# Patient Record
Sex: Male | Born: 1980
Health system: Southern US, Community
[De-identification: ages and names within clinical notes are randomized; demographics above are authoritative.]

## PROBLEM LIST (undated history)

## (undated) DIAGNOSIS — N179 Acute kidney failure, unspecified: Secondary | ICD-10-CM

## (undated) DIAGNOSIS — B20 Human immunodeficiency virus [HIV] disease: Secondary | ICD-10-CM

## (undated) DIAGNOSIS — B1910 Unspecified viral hepatitis B without hepatic coma: Secondary | ICD-10-CM

## (undated) DIAGNOSIS — D649 Anemia, unspecified: Secondary | ICD-10-CM

## (undated) DIAGNOSIS — E119 Type 2 diabetes mellitus without complications: Secondary | ICD-10-CM

---

## 2003-04-05 ENCOUNTER — Emergency Department (HOSPITAL_COMMUNITY): Admission: EM | Admit: 2003-04-05 | Discharge: 2003-04-06 | Payer: Self-pay | Admitting: Podiatry

## 2011-07-11 ENCOUNTER — Emergency Department (HOSPITAL_COMMUNITY)
Admission: EM | Admit: 2011-07-11 | Discharge: 2011-07-11 | Disposition: A | Discharge status: Home / Self Care | Payer: Self-pay | Attending: Emergency Medicine | Admitting: Emergency Medicine

## 2011-07-11 DIAGNOSIS — M545 Low back pain, unspecified: Secondary | ICD-10-CM | POA: Insufficient documentation

## 2011-07-11 DIAGNOSIS — Z21 Asymptomatic human immunodeficiency virus [HIV] infection status: Secondary | ICD-10-CM | POA: Insufficient documentation

## 2011-07-11 DIAGNOSIS — M79609 Pain in unspecified limb: Secondary | ICD-10-CM | POA: Insufficient documentation

## 2012-03-18 ENCOUNTER — Telehealth: Payer: Self-pay

## 2012-03-18 NOTE — Telephone Encounter (Signed)
Pt contacted for new intake appointment    March 21, 2012 @ 1:30 pm Laurell Josephs, [place]

## 2012-03-21 ENCOUNTER — Ambulatory Visit: Payer: Self-pay

## 2012-03-21 ENCOUNTER — Ambulatory Visit (INDEPENDENT_AMBULATORY_CARE_PROVIDER_SITE_OTHER): Payer: Self-pay

## 2012-03-21 DIAGNOSIS — Z113 Encounter for screening for infections with a predominantly sexual mode of transmission: Secondary | ICD-10-CM

## 2012-03-21 DIAGNOSIS — Z23 Encounter for immunization: Secondary | ICD-10-CM

## 2012-03-21 DIAGNOSIS — Z79899 Other long term (current) drug therapy: Secondary | ICD-10-CM

## 2012-03-21 DIAGNOSIS — B20 Human immunodeficiency virus [HIV] disease: Secondary | ICD-10-CM

## 2012-03-21 DIAGNOSIS — B977 Papillomavirus as the cause of diseases classified elsewhere: Secondary | ICD-10-CM

## 2012-03-21 LAB — COMPLETE METABOLIC PANEL WITH GFR
ALT: 27 U/L (ref 0–53)
AST: 24 U/L (ref 0–37)
BUN: 14 mg/dL (ref 6–23)
Calcium: 10 mg/dL (ref 8.4–10.5)
Chloride: 104 mEq/L (ref 96–112)
Creat: 1.02 mg/dL (ref 0.50–1.35)
GFR, Est African American: >89 mL/min
Total Bilirubin: 0.5 mg/dL (ref 0.3–1.2)

## 2012-03-21 LAB — LIPID PANEL
HDL: 28 mg/dL — ABNORMAL LOW (ref >39)
LDL Cholesterol: 85 mg/dL (ref 0–99)
Triglycerides: 299 mg/dL — ABNORMAL HIGH (ref <150)
VLDL: 60 mg/dL — ABNORMAL HIGH (ref 0–40)

## 2012-03-22 LAB — HEPATITIS A ANTIBODY, TOTAL: Hep A Total Ab: NEGATIVE

## 2012-03-22 LAB — HEPATITIS C ANTIBODY: HCV Ab: NEGATIVE

## 2012-03-22 LAB — HEPATITIS B SURFACE ANTIBODY,QUALITATIVE: Hep B S Ab: POSITIVE — AB

## 2012-03-22 LAB — CBC WITH DIFFERENTIAL/PLATELET
Basophils Absolute: 0 10*3/uL (ref 0.0–0.1)
Eosinophils Absolute: 0.1 10*3/uL (ref 0.0–0.7)
Eosinophils Relative: 3 % (ref 0–5)
HCT: 43.2 % (ref 39.0–52.0)
MCH: 26.5 pg (ref 26.0–34.0)
MCHC: 33.8 g/dL (ref 30.0–36.0)
MCV: 78.5 fL (ref 78.0–100.0)
Monocytes Absolute: 0.4 10*3/uL (ref 0.1–1.0)
Platelets: 130 10*3/uL — ABNORMAL LOW (ref 150–400)
RDW: 15.1 % (ref 11.5–15.5)
WBC: 3.4 10*3/uL — ABNORMAL LOW (ref 4.0–10.5)

## 2012-03-22 LAB — URINALYSIS
Bilirubin Urine: NEGATIVE
Hgb urine dipstick: NEGATIVE
Ketones, ur: NEGATIVE mg/dL
Specific Gravity, Urine: 1.023 (ref 1.005–1.030)
pH: 6.5 (ref 5.0–8.0)

## 2012-03-22 LAB — HIV-1 RNA ULTRAQUANT REFLEX TO GENTYP+: HIV 1 RNA Quant: 475526 copies/mL — ABNORMAL HIGH (ref <20)

## 2012-03-22 LAB — RPR

## 2012-03-22 LAB — HEPATITIS B SURFACE ANTIGEN: Hepatitis B Surface Ag: NEGATIVE

## 2012-03-22 NOTE — Progress Notes (Signed)
Pt has been without medications for at least 2 years.  Developed K103 04-23-09.  He admits to be noncompliant to last regimen of Truvada, Norvir  and Prezista.  He states he is now ready to restart treatment and be fully committed .  He fears his health may start to deteriorate soon if he does not address this diagnosis.  Pt has thrush present.   No other  Symptoms reported.   Laurell Josephs, RN

## 2012-03-28 ENCOUNTER — Ambulatory Visit: Payer: Self-pay

## 2012-03-29 DIAGNOSIS — Z21 Asymptomatic human immunodeficiency virus [HIV] infection status: Secondary | ICD-10-CM | POA: Insufficient documentation

## 2012-03-29 DIAGNOSIS — B20 Human immunodeficiency virus [HIV] disease: Secondary | ICD-10-CM | POA: Insufficient documentation

## 2012-03-29 DIAGNOSIS — B977 Papillomavirus as the cause of diseases classified elsewhere: Secondary | ICD-10-CM | POA: Insufficient documentation

## 2012-04-02 LAB — HIV-1 GENOTYPR PLUS

## 2012-04-09 ENCOUNTER — Telehealth: Payer: Self-pay | Admitting: *Deleted

## 2012-04-09 ENCOUNTER — Ambulatory Visit: Payer: Self-pay | Admitting: Internal Medicine

## 2012-04-09 ENCOUNTER — Ambulatory Visit: Payer: Self-pay

## 2012-04-09 NOTE — Telephone Encounter (Signed)
Called patient to try and reschedule him for the missed appt this morning. He advised that he slept through and needs an afternoon appt in the future. He has a follow up on 04/29/12.

## 2012-04-29 ENCOUNTER — Ambulatory Visit: Payer: Self-pay

## 2012-04-29 ENCOUNTER — Encounter: Payer: Self-pay | Admitting: Internal Medicine

## 2012-04-29 ENCOUNTER — Ambulatory Visit (INDEPENDENT_AMBULATORY_CARE_PROVIDER_SITE_OTHER): Payer: Self-pay | Admitting: Internal Medicine

## 2012-04-29 VITALS — BP 128/81 | HR 78 | Temp 98.2°F | Wt 196.0 lb

## 2012-04-29 DIAGNOSIS — B20 Human immunodeficiency virus [HIV] disease: Secondary | ICD-10-CM

## 2012-04-29 DIAGNOSIS — B3781 Candidal esophagitis: Secondary | ICD-10-CM | POA: Insufficient documentation

## 2012-04-29 DIAGNOSIS — Z21 Asymptomatic human immunodeficiency virus [HIV] infection status: Secondary | ICD-10-CM

## 2012-04-29 MED ORDER — DARUNAVIR ETHANOLATE 600 MG PO TABS: 600.0000 mg | ORAL_TABLET | Freq: Two times a day (BID) | ORAL | Status: DC

## 2012-04-29 MED ORDER — AZITHROMYCIN 600 MG PO TABS: 1200.0000 mg | ORAL_TABLET | ORAL | Status: DC

## 2012-04-29 MED ORDER — EMTRICITABINE-TENOFOVIR DF 200-300 MG PO TABS: 1.0000 | ORAL_TABLET | Freq: Every day | ORAL | Status: DC

## 2012-04-29 MED ORDER — FLUCONAZOLE 200 MG PO TABS: 200.0000 mg | ORAL_TABLET | Freq: Every day | ORAL | Status: AC

## 2012-04-29 MED ORDER — FLUCONAZOLE 150 MG PO TABS: 150.0000 mg | ORAL_TABLET | Freq: Once | ORAL | Status: AC

## 2012-04-29 MED ORDER — RITONAVIR 100 MG PO TABS: 100.0000 mg | ORAL_TABLET | Freq: Every day | ORAL | Status: DC

## 2012-04-29 MED ORDER — SULFAMETHOXAZOLE-TMP DS 800-160 MG PO TABS: 1.0000 | ORAL_TABLET | Freq: Every day | ORAL | Status: DC

## 2012-04-29 NOTE — Progress Notes (Signed)
Subjective:    Patient ID: Zachary Hill, male    DOB: 07/04/1981, 31 y.o.   MRN: [id number]  HPI HIV, CD 4 count 10. June 2012. Previously on Truvada/dar/rit. Cd 4 count above 200.  Stopped when he thought he was sick of taking meds. Now in a relationship with his new partner. She knows about his status. Felt sick once in the past year. Cold like symptoms, better on its own, lasted 1 week. Of late, thrush plus painful swallowing. He states that this reminds him of when he was first diagnosed with HIV in 2006 with candidal esophagitis. At that time his cd 4 count was low < 100.  He is currently between jobs does not have health insurance. He works as a Production assistant, radio. Current Outpatient Prescriptions on File Prior to Visit  Medication Sig Dispense Refill  . beta carotene w/minerals (OCUVITE) tablet Take 1 tablet by mouth daily.      . fish oil-omega-3 fatty acids 1000 MG capsule Take 2 g by mouth daily.      . vitamin B-12 (CYANOCOBALAMIN) 100 MCG tablet Take 100 mcg by mouth daily.      . vitamin B-12 (CYANOCOBALAMIN) 250 MCG tablet Take 250 mcg by mouth daily.       Active Ambulatory Problems    Diagnosis Date Noted  . Human immunodeficiency virus (HIV) disease 03/29/2012  . Human papillomavirus in conditions classified elsewhere and of unspecified site 03/29/2012   Resolved Ambulatory Problems    Diagnosis Date Noted  . No Resolved Ambulatory Problems   No Additional Past Medical History    Social hx: in a relationship with a woman. In between jobs, Airline pilot. occ drinking. Smokes 1pack lasts 5 days.  Family hx: dm, htn,    Review of Systems  Constitutional: Negative for fever, chills, diaphoresis, activity change, appetite change, fatigue and unexpected weight change.  HENT: Negative for congestion, sore throat, rhinorrhea, sneezing, trouble swallowing and sinus pressure.  Eyes: Negative for photophobia and visual disturbance.  Respiratory: Negative for cough, chest tightness,  shortness of breath, wheezing and stridor.  Cardiovascular: Negative for chest pain, palpitations and leg swelling.  Gastrointestinal: he has dysphagia. Negative for nausea, vomiting, abdominal pain, diarrhea, constipation, blood in stool, abdominal distention and anal bleeding.  Genitourinary: Negative for dysuria, hematuria, flank pain and difficulty urinating.  Musculoskeletal: Negative for myalgias, back pain, joint swelling, arthralgias and gait problem.  Skin: Negative for color change, pallor, rash and wound.  Neurological: Negative for dizziness, tremors, weakness and light-headedness.  Hematological: Negative for adenopathy. Does not bruise/bleed easily.  Psychiatric/Behavioral: Negative for behavioral problems, confusion, sleep disturbance, dysphoric mood, decreased concentration and agitation.       Objective:   Physical Exam BP 128/81  Pulse 78  Temp 98.2 F (36.8 C) (Oral)  Wt 196 lb (88.905 kg) Physical Exam  Constitutional: He is oriented to person, place, and time. He appears well-developed and well-nourished. No distress.  HENT: [place]/AT, PERRLA, EOMI, TM clear Mouth/Throat: Oropharynx is clear and moist. White plaque exudate posterior soft palate Cardiovascular: Normal rate, regular rhythm and normal heart sounds. Exam reveals no gallop and no friction rub.  No murmur heard.  Pulmonary/Chest: Effort normal and breath sounds normal. No respiratory distress. He has no wheezes.  Abdominal: Soft. Bowel sounds are normal. He exhibits no distension. There is no tenderness.  Lymphadenopathy:  He has no cervical adenopathy.  Neurological: He is alert and oriented to person, place, and time.  Skin: Skin is warm and dry. No  rash noted. No erythema.  Psychiatric: He has a normal mood and affect. His behavior is normal.          Assessment & Plan:  hiv = will restart regimen,  Darunavir, ritonavir and truvada once he gets adap  Possible eso candiasis = fluc 200mg  daily x  14 days  oi proph= fluc, bactrim, azithromcyin   rtc in 6 wks to check numbers after being on tx for 4 wks

## 2012-04-30 ENCOUNTER — Telehealth: Payer: Self-pay | Admitting: *Deleted

## 2012-04-30 NOTE — Telephone Encounter (Signed)
Received 3 rxes yesterday.  Wanting to know which one he should start with first.  The rxes are expensive.  RN confirmed that the rxes he received were Diflucan, Septra DS and Azithromycin.  RN advised starting all three based on Dr. Feliz Beam recommendation.  The pt did not receive the rxes for the HIV medications.  (Given to B. Hammond to send with ADAP application.)  Pt verbalized understanding.

## 2012-05-06 ENCOUNTER — Ambulatory Visit: Payer: Self-pay

## 2012-05-07 ENCOUNTER — Encounter: Payer: Self-pay | Admitting: *Deleted

## 2012-05-07 ENCOUNTER — Ambulatory Visit: Payer: Self-pay

## 2012-05-07 NOTE — Progress Notes (Signed)
Patient ID: Zachary Hill, male   DOB: 1981-08-18, 31 y.o.   MRN: [id number]  PATIENT IS IN NEED OF HELP GETTING INFORMATION TOGETHER AND GETTING HIS ADAP APPLIED FOR. 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 DR Drue Second IS VERY CONCERNED AS HE IS NOT FOLLOWING THROUGH AND HAS RISKY BEHAVIOR. SENDING THIS PATIENT TO BRIDGE COUNSELOR FOR HELP. BARBARA HAS TRIED TO HELP HIM SEVERAL TIMES ALSO.

## 2012-05-08 ENCOUNTER — Telehealth: Payer: Self-pay | Admitting: *Deleted

## 2012-05-08 DIAGNOSIS — B20 Human immunodeficiency virus [HIV] disease: Secondary | ICD-10-CM

## 2012-05-08 MED ORDER — FLUCONAZOLE 150 MG PO TABS: 150.0000 mg | ORAL_TABLET | Freq: Every day | ORAL | Status: AC

## 2012-05-08 MED ORDER — DAPSONE 100 MG PO TABS: 100.0000 mg | ORAL_TABLET | Freq: Every day | ORAL | Status: DC

## 2012-05-08 NOTE — Telephone Encounter (Signed)
Itching, nausea, vomiting, fever, dark urine, skin irritation on scrotum and headache since starting Bactrim last week.  Did not want to stop medication until he was told by MD.  Could not afford HIV medications.  Pt requesting advise from MD.  Forward to Dr. Drue Second.

## 2012-05-08 NOTE — Telephone Encounter (Signed)
RN spoke with Dr. Drue Second who changed the pt's medication to Dapsone 100mg  qd.  Pt to discontinue Bactrim DS today.  Pt instructed to come to [facility] to pick up Dapsone and Fluconazole today.  Pt to contact Payroll at [facility] Tuesdays to request W-2 from 2012 which is needed to complete ADAP application.

## 2012-05-09 ENCOUNTER — Telehealth: Payer: Self-pay | Admitting: *Deleted

## 2012-05-09 NOTE — Telephone Encounter (Signed)
Patient came into clinic to pick up his pt assistance medications, dapsone and azithromycin.  He is c/o nausea, and itching, thinks he had a reaction to bactrim. Asked how long he has been off the bactrim and he said one day.  Wanted to know if he should go to the ED, advised him to drink plenty of fluids and give it another day to see if it improves.  He has a very low CD4 and is not on HIV med.  Asked him if he has ADAP and he said not yet, but he should have it soon.  After he left I spoke with Lesle Reek, financial advocate and ADAP is not even in progress, because he has not brought in the financial information needed. Wendall Mola CMA

## 2012-05-10 ENCOUNTER — Other Ambulatory Visit: Payer: Self-pay | Admitting: Licensed Clinical Social Worker

## 2012-05-10 DIAGNOSIS — B20 Human immunodeficiency virus [HIV] disease: Secondary | ICD-10-CM

## 2012-05-10 MED ORDER — AZITHROMYCIN 600 MG PO TABS: 1200.0000 mg | ORAL_TABLET | ORAL | Status: AC

## 2012-05-10 MED ORDER — RITONAVIR 100 MG PO TABS: 100.0000 mg | ORAL_TABLET | Freq: Every day | ORAL | Status: DC

## 2012-05-10 MED ORDER — DAPSONE 100 MG PO TABS: 100.0000 mg | ORAL_TABLET | Freq: Every day | ORAL | Status: DC

## 2012-05-10 MED ORDER — DARUNAVIR ETHANOLATE 600 MG PO TABS: 600.0000 mg | ORAL_TABLET | Freq: Two times a day (BID) | ORAL | Status: DC

## 2012-05-10 MED ORDER — EMTRICITABINE-TENOFOVIR DF 200-300 MG PO TABS: 1.0000 | ORAL_TABLET | Freq: Every day | ORAL | Status: DC

## 2012-05-11 ENCOUNTER — Encounter (HOSPITAL_COMMUNITY): Payer: Self-pay | Admitting: *Deleted

## 2012-05-11 ENCOUNTER — Inpatient Hospital Stay (HOSPITAL_COMMUNITY)
Admission: EM | Admit: 2012-05-11 | Discharge: 2012-05-20 | DRG: 975 | Discharge status: Against Medical Advice | Payer: Medicaid Other | Attending: Internal Medicine | Admitting: Internal Medicine

## 2012-05-11 ENCOUNTER — Emergency Department (HOSPITAL_COMMUNITY): Payer: Medicaid Other

## 2012-05-11 DIAGNOSIS — R6510 Systemic inflammatory response syndrome (SIRS) of non-infectious origin without acute organ dysfunction: Secondary | ICD-10-CM | POA: Diagnosis present

## 2012-05-11 DIAGNOSIS — D696 Thrombocytopenia, unspecified: Secondary | ICD-10-CM | POA: Diagnosis present

## 2012-05-11 DIAGNOSIS — E876 Hypokalemia: Secondary | ICD-10-CM | POA: Diagnosis not present

## 2012-05-11 DIAGNOSIS — R112 Nausea with vomiting, unspecified: Secondary | ICD-10-CM | POA: Diagnosis present

## 2012-05-11 DIAGNOSIS — Z21 Asymptomatic human immunodeficiency virus [HIV] infection status: Secondary | ICD-10-CM | POA: Diagnosis present

## 2012-05-11 DIAGNOSIS — B20 Human immunodeficiency virus [HIV] disease: Principal | ICD-10-CM | POA: Diagnosis present

## 2012-05-11 DIAGNOSIS — Z881 Allergy status to other antibiotic agents status: Secondary | ICD-10-CM

## 2012-05-11 DIAGNOSIS — R0600 Dyspnea, unspecified: Secondary | ICD-10-CM | POA: Diagnosis present

## 2012-05-11 DIAGNOSIS — B3781 Candidal esophagitis: Secondary | ICD-10-CM

## 2012-05-11 DIAGNOSIS — N179 Acute kidney failure, unspecified: Secondary | ICD-10-CM | POA: Diagnosis not present

## 2012-05-11 DIAGNOSIS — R1310 Dysphagia, unspecified: Secondary | ICD-10-CM | POA: Diagnosis present

## 2012-05-11 DIAGNOSIS — K8020 Calculus of gallbladder without cholecystitis without obstruction: Secondary | ICD-10-CM | POA: Diagnosis present

## 2012-05-11 DIAGNOSIS — R197 Diarrhea, unspecified: Secondary | ICD-10-CM | POA: Diagnosis present

## 2012-05-11 DIAGNOSIS — K2970 Gastritis, unspecified, without bleeding: Secondary | ICD-10-CM | POA: Diagnosis present

## 2012-05-11 DIAGNOSIS — J029 Acute pharyngitis, unspecified: Secondary | ICD-10-CM | POA: Diagnosis present

## 2012-05-11 DIAGNOSIS — K219 Gastro-esophageal reflux disease without esophagitis: Secondary | ICD-10-CM | POA: Diagnosis present

## 2012-05-11 DIAGNOSIS — L299 Pruritus, unspecified: Secondary | ICD-10-CM | POA: Diagnosis present

## 2012-05-11 DIAGNOSIS — K648 Other hemorrhoids: Secondary | ICD-10-CM | POA: Diagnosis present

## 2012-05-11 DIAGNOSIS — K2990 Gastroduodenitis, unspecified, without bleeding: Secondary | ICD-10-CM | POA: Diagnosis present

## 2012-05-11 DIAGNOSIS — D649 Anemia, unspecified: Secondary | ICD-10-CM | POA: Diagnosis not present

## 2012-05-11 DIAGNOSIS — F17200 Nicotine dependence, unspecified, uncomplicated: Secondary | ICD-10-CM | POA: Diagnosis present

## 2012-05-11 DIAGNOSIS — J984 Other disorders of lung: Secondary | ICD-10-CM | POA: Diagnosis present

## 2012-05-11 DIAGNOSIS — Z8619 Personal history of other infectious and parasitic diseases: Secondary | ICD-10-CM

## 2012-05-11 DIAGNOSIS — R1319 Other dysphagia: Secondary | ICD-10-CM | POA: Diagnosis present

## 2012-05-11 DIAGNOSIS — R1013 Epigastric pain: Secondary | ICD-10-CM | POA: Diagnosis present

## 2012-05-11 DIAGNOSIS — G44209 Tension-type headache, unspecified, not intractable: Secondary | ICD-10-CM | POA: Diagnosis present

## 2012-05-11 DIAGNOSIS — D638 Anemia in other chronic diseases classified elsewhere: Secondary | ICD-10-CM | POA: Diagnosis present

## 2012-05-11 DIAGNOSIS — Z79899 Other long term (current) drug therapy: Secondary | ICD-10-CM

## 2012-05-11 DIAGNOSIS — D592 Drug-induced nonautoimmune hemolytic anemia: Secondary | ICD-10-CM | POA: Diagnosis not present

## 2012-05-11 DIAGNOSIS — R509 Fever, unspecified: Secondary | ICD-10-CM | POA: Diagnosis present

## 2012-05-11 DIAGNOSIS — R000 Tachycardia, unspecified: Secondary | ICD-10-CM | POA: Diagnosis present

## 2012-05-11 HISTORY — DX: Acute kidney failure, unspecified: N17.9

## 2012-05-11 HISTORY — DX: Human immunodeficiency virus (HIV) disease: B20

## 2012-05-11 HISTORY — DX: Unspecified viral hepatitis B without hepatic coma: B19.10

## 2012-05-11 HISTORY — DX: Anemia, unspecified: D64.9

## 2012-05-11 LAB — CBC WITH DIFFERENTIAL/PLATELET
Basophils Absolute: 0 10*3/uL (ref 0.0–0.1)
Basophils Relative: 0 % (ref 0–1)
Eosinophils Absolute: 0.1 10*3/uL (ref 0.0–0.7)
Eosinophils Relative: 1 % (ref 0–5)
HCT: 38.6 % — ABNORMAL LOW (ref 39.0–52.0)
Hemoglobin: 13.2 g/dL (ref 13.0–17.0)
MCH: 27.2 pg (ref 26.0–34.0)
MCHC: 34.2 g/dL (ref 30.0–36.0)
MCV: 79.4 fL (ref 78.0–100.0)
Monocytes Absolute: 0.5 10*3/uL (ref 0.1–1.0)
Monocytes Relative: 6 % (ref 3–12)
Neutro Abs: 8.2 10*3/uL — ABNORMAL HIGH (ref 1.7–7.7)
RDW: 14 % (ref 11.5–15.5)

## 2012-05-11 LAB — COMPREHENSIVE METABOLIC PANEL
AST: 85 U/L — ABNORMAL HIGH (ref 0–37)
Albumin: 3.8 g/dL (ref 3.5–5.2)
BUN: 12 mg/dL (ref 6–23)
Calcium: 9.5 mg/dL (ref 8.4–10.5)
Creatinine, Ser: 0.91 mg/dL (ref 0.50–1.35)
Total Bilirubin: 1.3 mg/dL — ABNORMAL HIGH (ref 0.3–1.2)
Total Protein: 8.4 g/dL — ABNORMAL HIGH (ref 6.0–8.3)

## 2012-05-11 LAB — URINALYSIS, ROUTINE W REFLEX MICROSCOPIC
Glucose, UA: NEGATIVE mg/dL
Ketones, ur: 15 mg/dL — AB
Protein, ur: 100 mg/dL — AB
pH: 6 (ref 5.0–8.0)

## 2012-05-11 LAB — LIPASE, BLOOD: Lipase: 52 U/L (ref 11–59)

## 2012-05-11 LAB — URINE MICROSCOPIC-ADD ON

## 2012-05-11 MED ORDER — ACETAMINOPHEN 80 MG RE SUPP
80.0000 mg | Freq: Once | RECTAL | Status: DC
  Filled 2012-05-11: qty 1

## 2012-05-11 MED ORDER — SODIUM CHLORIDE 0.9 % IV SOLN
1000.0000 mL | Freq: Once | INTRAVENOUS | Status: AC
  Administered 2012-05-11: 1000 mL via INTRAVENOUS

## 2012-05-11 MED ORDER — SODIUM CHLORIDE 0.9 % IV BOLUS (SEPSIS)
1000.0000 mL | Freq: Once | INTRAVENOUS | Status: AC
  Administered 2012-05-11: 1000 mL via INTRAVENOUS

## 2012-05-11 MED ORDER — ACETAMINOPHEN 325 MG PO TABS
ORAL_TABLET | ORAL | Status: AC
  Filled 2012-05-11: qty 2

## 2012-05-11 MED ORDER — AZITHROMYCIN 600 MG PO TABS: 1200.0000 mg | ORAL_TABLET | ORAL | Status: DC

## 2012-05-11 MED ORDER — ACETAMINOPHEN 325 MG PO TABS
650.0000 mg | ORAL_TABLET | Freq: Four times a day (QID) | ORAL | Status: DC | PRN
  Administered 2012-05-12: 650 mg via ORAL
  Filled 2012-05-11 (×3): qty 2

## 2012-05-11 MED ORDER — ONDANSETRON HCL 4 MG PO TABS: 4.0000 mg | ORAL_TABLET | Freq: Four times a day (QID) | ORAL | Status: DC | PRN

## 2012-05-11 MED ORDER — ONDANSETRON HCL 4 MG/2ML IJ SOLN
4.0000 mg | Freq: Four times a day (QID) | INTRAMUSCULAR | Status: DC | PRN
  Administered 2012-05-11 – 2012-05-16 (×8): 4 mg via INTRAVENOUS
  Filled 2012-05-11 (×8): qty 2

## 2012-05-11 MED ORDER — PROMETHAZINE HCL 25 MG/ML IJ SOLN
12.5000 mg | Freq: Once | INTRAMUSCULAR | Status: AC
  Administered 2012-05-11: 12.5 mg via INTRAVENOUS
  Filled 2012-05-11: qty 1

## 2012-05-11 MED ORDER — ENOXAPARIN SODIUM 40 MG/0.4ML ~~LOC~~ SOLN
40.0000 mg | SUBCUTANEOUS | Status: DC
  Administered 2012-05-11 – 2012-05-13 (×3): 40 mg via SUBCUTANEOUS
  Filled 2012-05-11 (×4): qty 0.4

## 2012-05-11 MED ORDER — ACETAMINOPHEN 650 MG RE SUPP
650.0000 mg | Freq: Once | RECTAL | Status: AC
  Administered 2012-05-11: 650 mg via RECTAL
  Filled 2012-05-11: qty 1

## 2012-05-11 MED ORDER — ACETAMINOPHEN 325 MG PO TABS
650.0000 mg | ORAL_TABLET | Freq: Once | ORAL | Status: AC
  Administered 2012-05-11: 650 mg via ORAL

## 2012-05-11 MED ORDER — FLUCONAZOLE 200 MG PO TABS
200.0000 mg | ORAL_TABLET | Freq: Every day | ORAL | Status: DC
  Administered 2012-05-11 – 2012-05-12 (×2): 200 mg via ORAL
  Filled 2012-05-11 (×2): qty 1

## 2012-05-11 MED ORDER — PIPERACILLIN-TAZOBACTAM 3.375 G IVPB
3.3750 g | Freq: Three times a day (TID) | INTRAVENOUS | Status: DC
  Administered 2012-05-11 – 2012-05-12 (×3): 3.375 g via INTRAVENOUS
  Filled 2012-05-11 (×5): qty 50

## 2012-05-11 MED ORDER — SENNA 8.6 MG PO TABS
1.0000 | ORAL_TABLET | Freq: Two times a day (BID) | ORAL | Status: DC
  Administered 2012-05-11 – 2012-05-12 (×2): 8.6 mg via ORAL
  Filled 2012-05-11 (×19): qty 1

## 2012-05-11 MED ORDER — VANCOMYCIN HCL IN DEXTROSE 1-5 GM/200ML-% IV SOLN
1000.0000 mg | Freq: Three times a day (TID) | INTRAVENOUS | Status: DC
  Administered 2012-05-11 – 2012-05-13 (×6): 1000 mg via INTRAVENOUS
  Filled 2012-05-11 (×10): qty 200

## 2012-05-11 MED ORDER — ONDANSETRON HCL 4 MG/2ML IJ SOLN
4.0000 mg | Freq: Once | INTRAMUSCULAR | Status: AC
  Administered 2012-05-11: 4 mg via INTRAVENOUS

## 2012-05-11 MED ORDER — KETOROLAC TROMETHAMINE 30 MG/ML IJ SOLN
30.0000 mg | Freq: Once | INTRAMUSCULAR | Status: AC
  Administered 2012-05-11: 30 mg via INTRAVENOUS
  Filled 2012-05-11: qty 1

## 2012-05-11 MED ORDER — GI COCKTAIL ~~LOC~~
30.0000 mL | Freq: Once | ORAL | Status: AC
  Administered 2012-05-11: 30 mL via ORAL
  Filled 2012-05-11 (×2): qty 30

## 2012-05-11 MED ORDER — SODIUM CHLORIDE 0.9 % IV SOLN
1000.0000 mL | INTRAVENOUS | Status: DC
  Administered 2012-05-11: 1000 mL via INTRAVENOUS

## 2012-05-11 MED ORDER — ACETAMINOPHEN 650 MG RE SUPP
650.0000 mg | Freq: Four times a day (QID) | RECTAL | Status: DC | PRN
  Administered 2012-05-12: 650 mg via RECTAL

## 2012-05-11 MED ORDER — DAPSONE 100 MG PO TABS
100.0000 mg | ORAL_TABLET | Freq: Every day | ORAL | Status: DC
  Administered 2012-05-11 – 2012-05-16 (×6): 100 mg via ORAL
  Filled 2012-05-11 (×7): qty 1

## 2012-05-11 NOTE — ED Notes (Signed)
PT is here with vomiting, fever, night sweats.  Pt is HIV and states CD4 counts have been low.  Thursday stopped taking Bactrim and was changed to another anbx for thrush.  Pt has been sick all week.  Pt has temp 103.0.  Pt is not on any HIV meds.  No diarrhea.  Pt reports abdominal pain.  Pt reports coughing and pain with lower back

## 2012-05-11 NOTE — H&P (Signed)
Hospital Admission Note Date: 05/11/2012  Patient name: Zachary Hill Medical record number: [id number] Date of birth: July 07, 1981 Age: 31 y.o. Gender: male PCP: Zachary Munson, MD  Medical Service: Internal Medicine Teaching Service  Attending physician:     1st Contact: Zachary Hill   Pager: [phone] 2nd Contact: Zachary Hill    Pager:  [phone] After 5 pm or weekends: 1st Contact:      Pager: [phone] 2nd Contact:      Pager: [phone]  Chief Complaint: fever with nausea and vomiting  History of Present Illness: 31yo M with h/o Hep B, HPV, and HIV and has not been taking his medications for close to a year and had a CD4 count of 10 on 7/29, who presented to the ED with nausea, vomiting, SOB, and fevers for the past week, admitted with a fever of 103 of unknown origin.  He is seen by Dr. Drue Hill, and was previously on Truvada/dar/rit with a CD 4 count above 200.  He stopped taking his medications because he felt that he was doing well and that he was "on cloud nine" and forgot to take his meds, stating "out of site, out of mind". He presented to Dr. Drue Hill in clinic on 7/29 c/o cold-like sx and painful swallowing concerning for esophageal candidiasis. She prescribed him fluconazole x 14days, Bactrim, and Azithromycin. He is to restart his HIV medications once his ADAP is approved. He developed nausea, vomiting, fevers, chills and SOB with a rattling cough, and called Dr. Drue Hill who changed the Bactrim to Dapsone. He continued taking the medications despite continued symptoms, until deciding to go to the ED 8/10 for further evaluation and treatment. He denies sick contacts, but states that his "living conditions are deplorable." Despite his nausea and vomiting, as of today his is able to tolerate water and pretzels. He does endorse mid epigastric and RUQ abdominal pain, constipation x 3-4 days, dark urine, pruritis of his hands and feet, and itching of his groin.    Meds: No current  facility-administered medications on file prior to encounter.   Current Outpatient Prescriptions on File Prior to Encounter  Medication Sig Dispense Refill  . azithromycin (ZITHROMAX) 600 MG tablet Take 2 tablets (1,200 mg total) by mouth every 7 (seven) days.  8 tablet  3  . dapsone 100 MG tablet Take 1 tablet (100 mg total) by mouth daily.  30 tablet  0  . darunavir (PREZISTA) 600 MG tablet Take 1 tablet (600 mg total) by mouth 2 (two) times daily with a meal.  60 tablet  5  . Fluconazole 200MG  Take 1 tablet daily by mouth x 2 weeks, starting 04/29/12       Allergies: Allergies as of 05/11/2012 - Review Complete 05/11/2012  Allergen Reaction Noted  . Fruit & vegetable daily (nutritional supplements)  03/22/2012  . Bactrim (sulfamethoxazole w-trimethoprim) Itching, Rash, and Other (See Comments) 05/11/2012   Past Medical History  Diagnosis Date  . HIV disease   . Hepatitis B    History reviewed. No pertinent past surgical history. No family history on file. History   Social History  . Marital Status: Single    Spouse Name: N/A    Number of Children: N/A  . Years of Education: N/A   Occupational History  . STUDENT    Social History Main Topics  . Smoking status: Current Everyday Smoker -- 0.2 packs/day    Types: Cigarettes  . Smokeless tobacco: Never Used  . Alcohol Use: 1.5 oz/week  3 drink(s) per week     red wine , social   . Drug Use: No  . Sexually Active: Not Currently -- Male partner(s)   Other Topics Concern  . Not on file   Social History Narrative  . No narrative on file    Review of Systems: Per HPI  Physical Exam: Blood pressure 135/74, pulse 111, temperature 102.8 F (39.3 C), temperature source Oral, resp. rate 20, height 5\' 6"  (1.676 m), weight 192 lb 8 oz (87.317 kg), SpO2 98.00%. Constitutional: Vital signs reviewed.  Patient is a well-developed and well-nourished male in no acute distress and cooperative with exam. Alert and oriented x3.    Head: Normocephalic and atraumatic Mouth: No erythema or exudates, MMM Eyes: PERRL, EOMI, conjunctivae injected bilaterally, No scleral icterus.  Neck: Supple, Trachea midline normal ROM, No JVD, mass, thyromegaly, or carotid bruit present.  Cardiovascular: Mild tachycardia, regular rhythm, S1 normal, S2 normal, no MRG, pulses symmetric and intact bilaterally Pulmonary/Chest: Diminished breath sounds diffusely, no appreciable wheezes, rales, or rhonchi Abdominal: Soft. TTP in mid epigastrium and RUQ inferior to rib cage, non-distended, bowel sounds are normal, no masses, organomegaly, or guarding present.  GU: Mild CVA tenderness. No rash or ulcerations seen on penis or scrotum. Musculoskeletal: No joint deformities, erythema, or stiffness, ROM full and no nontender Neurological: A&O x3, Strength is normal and symmetric bilaterally, cranial nerve II-XII are grossly intact, no focal motor deficit, sensory intact to light touch bilaterally.  Skin: Warm, dry and intact. No rash, cyanosis, or clubbing.  Psychiatric: Normal mood and affect. Speech and behavior is normal. Judgment and thought content normal. Cognition and memory are normal.   Lab results: Basic Metabolic Panel:  Basename 05/11/12 1159  NA 134*  K 3.6  CL 100  CO2 20  GLUCOSE 104*  BUN 12  CREATININE 0.91  CALCIUM 9.5  MG --  PHOS --   Liver Function Tests:  Basename 05/11/12 1159  AST 85*  ALT 138*  ALKPHOS 293*  BILITOT 1.3*  PROT 8.4*  ALBUMIN 3.8    Basename 05/11/12 1347  LIPASE 52  AMYLASE --   No results found for this basename: AMMONIA:2 in the last 72 hours CBC:  Basename 05/11/12 1159  WBC 9.8  NEUTROABS 8.2*  HGB 13.2  HCT 38.6*  MCV 79.4  PLT 98*   Urinalysis:  Basename 05/11/12 1218  COLORURINE ORANGE*  LABSPEC 1.029  PHURINE 6.0  GLUCOSEU NEGATIVE  HGBUR NEGATIVE  BILIRUBINUR SMALL*  KETONESUR 15*  PROTEINUR 100*  UROBILINOGEN 4.0*  NITRITE NEGATIVE  LEUKOCYTESUR TRACE*     Imaging results:  Dg Chest 2 View  05/11/2012  *RADIOLOGY REPORT*  Clinical Data: Fever.  Nausea vomiting.  CHEST - 2 VIEW  Comparison: None.  Findings: Cardiomediastinal silhouette unremarkable.  Lungs clear. Bronchovascular markings normal.  Pulmonary vascularity normal.  No pleural effusions.  No pneumothorax.  Visualized bony thorax intact.  IMPRESSION: Normal examination.  Original Report Authenticated By: Arnell Sieving, M.D.   US Abdomen Complete  05/11/2012  *RADIOLOGY REPORT*  Clinical Data:  Nausea/vomiting, abdominal pain  COMPLETE ABDOMINAL ULTRASOUND  Comparison:  None.  Findings:  Gallbladder:  Contracted gallbladder, likely related to nonfasting state.  No gallstones or pericholecystic fluid.  Common bile duct:  Measures 5 mm.  Liver:  No focal lesion identified.  Within normal limits in parenchymal echogenicity.  IVC:  Appears normal.  Pancreas:  Poorly visualized but grossly unremarkable.  Spleen:  Measures 6.4 cm.  Right Kidney:  Measures 1.4 cm.  No mass or hydronephrosis.  Left Kidney:  Measures 11.3 cm.  No mass or hydronephrosis.  Abdominal aorta:  Poorly visualized.  No aneurysm identified.  IMPRESSION: Contracted gallbladder.  No findings to suggest acute cholecystitis.  Otherwise negative abdominal ultrasound.  Original Report Authenticated By: Charline Bills, M.D.    Assessment & Plan by Problem:  1. Nausea/vomiting:  Concern for bacterial vs viral illness given weakened immune system. CXR unremarkable and US abdomen unremarkable. Other differential include GERD, gallstones although onset has been acute.  - Zofran for nausea  - NS @ 125 cc/hr overnight for some volume loss  - Full diet if tolerated   2. Fever: Given CD4 count 10 concern for sepsis from MAC versus bacteremia is valid. No acute evidence for pneumonia although will repeat CXR in AM after fluids to rule out PCP, although less likely given no hypoxia or respiratory symptoms. Urine unremarkable. Blood  cultures times 2 drawn in ED. No signs or symptoms concerning for meningitis or encephalitis.  - Given immunosupression will cover with broad spectrum of vancomycin and zosyn overnight  - Repeat CXR in AM  - Follow cultures   3. AIDS: CD4 last 10, has been off ARVs for some time now and is in process of getting back on medications. Will hold ARVs overnight over concern of possible side effects masking clinical picture. No concern for IRIS as he has not started meds yet.  - PPX with dapsone and azithromycin  - Tx for possible candidal esophagitis with fluconazole for 2 weeks started 7/30  4. Pruitis: No rashes see on physical exam. Sx could be from Dapsone. Will continue to monitor.   5. DVT ppx: Lovenox [place] daily   Signed: Genelle Gather 05/11/2012, 9:15 PM

## 2012-05-11 NOTE — Progress Notes (Signed)
ANTIBIOTIC CONSULT NOTE - INITIAL  Pharmacy Consult for Vancomycin/Zosyn Indication: Empiric therapy  Allergies  Allergen Reactions  . Fruit & Vegetable Daily (Nutritional Supplements)     Allergic to RAW fruits and vegetables .  Marland Kitchen Bactrim (Sulfamethoxazole W-Trimethoprim) Itching, Rash and Other (See Comments)    "flu like symptoms"    Patient Measurements: Height: 5\' 6"  (167.6 cm) Weight: 192 lb 8 oz (87.317 kg) IBW/kg (Calculated) : 63.8   Vital Signs: Temp: 102.8 F (39.3 C) (08/10 1735) Temp src: Oral (08/10 1735) BP: 135/74 mmHg (08/10 1735) Pulse Rate: 111  (08/10 1735) Intake/Output from previous day:   Intake/Output from this shift:    Labs:  Basename 05/11/12 1159  WBC 9.8  HGB 13.2  PLT 98*  LABCREA --  CREATININE 0.91   Estimated Creatinine Clearance: 121.8 ml/min (by C-G formula based on Cr of 0.91). No results found for this basename: VANCOTROUGH:2,VANCOPEAK:2,VANCORANDOM:2,GENTTROUGH:2,GENTPEAK:2,GENTRANDOM:2,TOBRATROUGH:2,TOBRAPEAK:2,TOBRARND:2,AMIKACINPEAK:2,AMIKACINTROU:2,AMIKACIN:2, in the last 72 hours   Microbiology: No results found for this or any previous visit (from the past 720 hour(s)).  Medical History: Past Medical History  Diagnosis Date  . HIV disease   . Hepatitis B     Assessment: Pt. Is a 31 YO HIV and Hep B positive male who has not been taking HARRT for over a year. He presented with fever, n/v and dry cough and SOB. Pharmacy is consulted to start vancomycin and zosyn for empiric therapy. Scr 0.91, est. crcl > 100. Blood and urine cultures pending.   Goal of Therapy:  Vancomycin trough level 15-20 mcg/ml  Plan:  - Vancomycin 1g IV Q 8hrs - zosyn 3.375 g IV Q 8hrs (4hr infusion) - f/u renal function and cultures, check vancomycin trough at steady state.   Bayard Hugger, PharmD, BCPS  Clinical Pharmacist  Pager: [phone]  05/11/2012,9:20 PM

## 2012-05-11 NOTE — Progress Notes (Addendum)
Pt arrived to the unit with admitting dx. N/V/D, fever and chills. Pt is alert and oriented x4. He is ambulatory with stand by assist. No skin issues. Elevated temp. Pt oriented to the unit. No distress noted just complain of having chills. Will cont to monitor. MD was paged and made aware that pt has arrived to the unit.

## 2012-05-11 NOTE — ED Provider Notes (Signed)
History     CSN: [id number]  Arrival date & time 05/11/12  1133   First MD Initiated Contact with Patient 05/11/12 1321      Chief Complaint  Patient presents with  . Fever  . Emesis    (Consider location/radiation/quality/duration/timing/severity/associated sxs/prior treatment) HPI  31 y/o male INAD c/o N/V without diarrhea. Pt ahs not been taking HARRT for >1 year due to financial concerns. Last CD4 was 10 about 1 week ago. Pt reports dry cough and SOB.   Past Medical History  Diagnosis Date  . HIV disease     History reviewed. No pertinent past surgical history.  No family history on file.  History  Substance Use Topics  . Smoking status: Current Everyday Smoker -- 0.2 packs/day    Types: Cigarettes  . Smokeless tobacco: Never Used  . Alcohol Use: 1.5 oz/week    3 drink(s) per week     red wine , social       Review of Systems  Allergies  Fruit & vegetable daily and Bactrim  Home Medications   Current Outpatient Rx  Name Route Sig Dispense Refill  . AZITHROMYCIN 600 MG PO TABS Oral Take 2 tablets (1,200 mg total) by mouth every 7 (seven) days. 8 tablet 3  . DAPSONE 100 MG PO TABS Oral Take 1 tablet (100 mg total) by mouth daily. 30 tablet 0  . DARUNAVIR ETHANOLATE 600 MG PO TABS Oral Take 1 tablet (600 mg total) by mouth 2 (two) times daily with a meal. 60 tablet 5  . EMTRICITABINE-TENOFOVIR 200-300 MG PO TABS Oral Take 1 tablet by mouth daily. 30 tablet 5  . FLUCONAZOLE 200 MG PO TABS Oral Take 200 mg by mouth daily.    Marland Kitchen RITONAVIR 100 MG PO TABS Oral Take 1 tablet (100 mg total) by mouth daily. 60 tablet 5    BP 116/74  Pulse 108  Temp 100.1 F (37.8 C) (Oral)  Resp 20  SpO2 100%  Physical Exam  Constitutional: He is oriented to person, place, and time. He appears well-developed and well-nourished.  HENT:  Head: Normocephalic and atraumatic.  Eyes: Conjunctivae and EOM are normal. Pupils are equal, round, and reactive to light.  Neck: Normal  range of motion. Neck supple.  Cardiovascular: Normal rate, regular rhythm, normal heart sounds and intact distal pulses.  Exam reveals no gallop and no friction rub.   No murmur heard. Pulmonary/Chest: Effort normal and breath sounds normal. No respiratory distress. He has no wheezes. He has no rales. He exhibits no tenderness.  Abdominal: Soft. Bowel sounds are normal. He exhibits no distension and no mass. There is tenderness. There is no rebound and no guarding.       Mild tenderness to deep palpation of the epigastric area  Musculoskeletal: Normal range of motion.  Neurological: He is alert and oriented to person, place, and time.  Skin: Skin is warm and dry.    ED Course  Procedures (including critical care time)  Labs Reviewed  URINALYSIS, ROUTINE W REFLEX MICROSCOPIC - Abnormal; Notable for the following:    Color, Urine ORANGE (*)  BIOCHEMICALS MAY BE AFFECTED BY COLOR   Bilirubin Urine SMALL (*)     Ketones, ur 15 (*)     Protein, ur 100 (*)     Urobilinogen, UA 4.0 (*)     Leukocytes, UA TRACE (*)     All other components within normal limits  CBC WITH DIFFERENTIAL - Abnormal; Notable for the following:  HCT 38.6 (*)     Platelets 98 (*)  PLATELET COUNT CONFIRMED BY SMEAR   Neutrophils Relative 84 (*)     Neutro Abs 8.2 (*)     Lymphocytes Relative 10 (*)     All other components within normal limits  COMPREHENSIVE METABOLIC PANEL - Abnormal; Notable for the following:    Sodium 134 (*)     Glucose, Bld 104 (*)     Total Protein 8.4 (*)     AST 85 (*)     ALT 138 (*)     Alkaline Phosphatase 293 (*)     Total Bilirubin 1.3 (*)     All other components within normal limits  URINE MICROSCOPIC-ADD ON   Dg Chest 2 View  05/11/2012  *RADIOLOGY REPORT*  Clinical Data: Fever.  Nausea vomiting.  CHEST - 2 VIEW  Comparison: None.  Findings: Cardiomediastinal silhouette unremarkable.  Lungs clear. Bronchovascular markings normal.  Pulmonary vascularity normal.  No pleural  effusions.  No pneumothorax.  Visualized bony thorax intact.  IMPRESSION: Normal examination.  Original Report Authenticated By: Arnell Sieving, M.D.     1. Fever   2. Nausea & vomiting       MDM  31 year old male with HIV and hepatitis B last CD4 count was 10 patient is currently not on any retroviral therapy at this time complaining of fever and nausea with vomiting for 7 days patient has not had any diarrhea he reports some mild epigastric pain.   Abdomen is benign with no rebound guarding or Murphy sign. CXR, UA are normal  RUQ [place] is unremarkable and lipase is normal. However considering his low WBC count of 3.4 his current count of 9.8 shows a relative increase and he tachy and febrile, he meets SIRS criteria and should be admitted for evalution of source of infection.   Consult from patient's infectious disease doctor Jerolyn Center appreciated: She recommends admission to this teaching service for evaluation on the source of the fever.  Patient will be admitted to MedSurg floor for to find the source of this infection.        Wynetta Emery, PA-C 05/11/12 1654

## 2012-05-12 ENCOUNTER — Inpatient Hospital Stay (HOSPITAL_COMMUNITY): Payer: Medicaid Other

## 2012-05-12 ENCOUNTER — Encounter (HOSPITAL_COMMUNITY): Payer: Self-pay | Admitting: *Deleted

## 2012-05-12 DIAGNOSIS — R6510 Systemic inflammatory response syndrome (SIRS) of non-infectious origin without acute organ dysfunction: Secondary | ICD-10-CM

## 2012-05-12 DIAGNOSIS — B20 Human immunodeficiency virus [HIV] disease: Secondary | ICD-10-CM

## 2012-05-12 LAB — COMPREHENSIVE METABOLIC PANEL
AST: 77 U/L — ABNORMAL HIGH (ref 0–37)
Albumin: 3.2 g/dL — ABNORMAL LOW (ref 3.5–5.2)
Chloride: 102 mEq/L (ref 96–112)
Creatinine, Ser: 1.14 mg/dL (ref 0.50–1.35)
Potassium: 3.4 mEq/L — ABNORMAL LOW (ref 3.5–5.1)
Total Bilirubin: 1.7 mg/dL — ABNORMAL HIGH (ref 0.3–1.2)
Total Protein: 7.1 g/dL (ref 6.0–8.3)

## 2012-05-12 LAB — LIPID PANEL
HDL: 13 mg/dL — ABNORMAL LOW (ref >39)
LDL Cholesterol: 85 mg/dL (ref 0–99)
Total CHOL/HDL Ratio: 10.8 RATIO
Triglycerides: 217 mg/dL — ABNORMAL HIGH (ref <150)
VLDL: 43 mg/dL — ABNORMAL HIGH (ref 0–40)

## 2012-05-12 LAB — CRYPTOCOCCAL ANTIGEN: Crypto Ag: NEGATIVE

## 2012-05-12 LAB — EXPECTORATED SPUTUM ASSESSMENT W GRAM STAIN, RFLX TO RESP C

## 2012-05-12 LAB — CBC
MCHC: 33.3 g/dL (ref 30.0–36.0)
Platelets: 100 10*3/uL — ABNORMAL LOW (ref 150–400)
RDW: 13.9 % (ref 11.5–15.5)
WBC: 8.9 10*3/uL (ref 4.0–10.5)

## 2012-05-12 LAB — URINE CULTURE

## 2012-05-12 LAB — LACTATE DEHYDROGENASE: LDH: 232 U/L (ref 94–250)

## 2012-05-12 LAB — FERRITIN: Ferritin: 1334 ng/mL — ABNORMAL HIGH (ref 22–322)

## 2012-05-12 MED ORDER — ACETAMINOPHEN 325 MG PO TABS: 650.0000 mg | ORAL_TABLET | ORAL | Status: DC | PRN

## 2012-05-12 MED ORDER — FLUCONAZOLE IN SODIUM CHLORIDE 400-0.9 MG/200ML-% IV SOLN
400.0000 mg | INTRAVENOUS | Status: DC
  Filled 2012-05-12: qty 200

## 2012-05-12 MED ORDER — PROMETHAZINE HCL 25 MG/ML IJ SOLN
12.5000 mg | Freq: Four times a day (QID) | INTRAMUSCULAR | Status: DC | PRN
  Administered 2012-05-13 – 2012-05-20 (×11): 12.5 mg via INTRAVENOUS
  Filled 2012-05-12 (×12): qty 1

## 2012-05-12 MED ORDER — ACETAMINOPHEN 650 MG RE SUPP
650.0000 mg | Freq: Four times a day (QID) | RECTAL | Status: DC | PRN
  Administered 2012-05-12: 650 mg via RECTAL
  Filled 2012-05-12: qty 1

## 2012-05-12 MED ORDER — SODIUM CHLORIDE 0.9 % IV SOLN
1000.0000 mL | INTRAVENOUS | Status: DC
  Administered 2012-05-12 – 2012-05-13 (×2): 1000 mL via INTRAVENOUS

## 2012-05-12 MED ORDER — DEXTROSE 5 % IV SOLN
2.0000 g | INTRAVENOUS | Status: DC
  Administered 2012-05-12 – 2012-05-13 (×2): 2 g via INTRAVENOUS
  Filled 2012-05-12 (×4): qty 2

## 2012-05-12 MED ORDER — FLUCONAZOLE IN SODIUM CHLORIDE 200-0.9 MG/100ML-% IV SOLN
200.0000 mg | INTRAVENOUS | Status: DC
  Administered 2012-05-13: 200 mg via INTRAVENOUS
  Filled 2012-05-12 (×2): qty 100

## 2012-05-12 MED ORDER — ETHAMBUTOL HCL 400 MG PO TABS
15.0000 mg/kg | ORAL_TABLET | Freq: Every day | ORAL | Status: DC
  Administered 2012-05-13 – 2012-05-16 (×5): 1350 mg via ORAL
  Administered 2012-05-17: 150 mg via ORAL
  Administered 2012-05-18 – 2012-05-19 (×2): 1350 mg via ORAL
  Administered 2012-05-20: 10:00:00 via ORAL
  Filled 2012-05-12 (×10): qty 1.5

## 2012-05-12 MED ORDER — METRONIDAZOLE IN NACL 5-0.79 MG/ML-% IV SOLN
500.0000 mg | Freq: Three times a day (TID) | INTRAVENOUS | Status: DC
  Administered 2012-05-12 – 2012-05-14 (×5): 500 mg via INTRAVENOUS
  Filled 2012-05-12 (×7): qty 100

## 2012-05-12 MED ORDER — ACETAMINOPHEN 325 MG PO TABS
650.0000 mg | ORAL_TABLET | Freq: Four times a day (QID) | ORAL | Status: DC | PRN
  Administered 2012-05-13 – 2012-05-20 (×18): 650 mg via ORAL
  Filled 2012-05-12 (×18): qty 2

## 2012-05-12 MED ORDER — ACETAMINOPHEN 650 MG RE SUPP
650.0000 mg | RECTAL | Status: DC | PRN
  Administered 2012-05-12: 650 mg via RECTAL
  Filled 2012-05-12: qty 1

## 2012-05-12 MED ORDER — SODIUM CHLORIDE 0.9 % IV SOLN
1000.0000 mL | INTRAVENOUS | Status: DC
  Administered 2012-05-12: 1000 mL via INTRAVENOUS

## 2012-05-12 MED ORDER — IOHEXOL 300 MG/ML  SOLN
100.0000 mL | Freq: Once | INTRAMUSCULAR | Status: AC | PRN
  Administered 2012-05-12: 100 mL via INTRAVENOUS

## 2012-05-12 MED ORDER — AZITHROMYCIN 500 MG PO TABS
500.0000 mg | ORAL_TABLET | Freq: Every day | ORAL | Status: DC
  Administered 2012-05-13: 500 mg via ORAL
  Filled 2012-05-12 (×2): qty 1

## 2012-05-12 MED ORDER — IBUPROFEN 400 MG PO TABS
400.0000 mg | ORAL_TABLET | ORAL | Status: DC | PRN
  Administered 2012-05-12 – 2012-05-16 (×3): 400 mg via ORAL
  Filled 2012-05-12 (×4): qty 1

## 2012-05-12 MED ORDER — AZITHROMYCIN 250 MG PO TABS: 250.0000 mg | ORAL_TABLET | Freq: Every day | ORAL | Status: DC

## 2012-05-12 NOTE — H&P (Signed)
IM Attending  86 man with HIV, off ART for a year and now with CD4 < 10.  Back with Dr. Drue Second and on dapsone, azithromycin, and diflucan pending ADAP to resume ART.  Adm for tepm of 103 w/o localizing site of infection.  Has thrush but on rx for > a week.  Has AST = 85 and ALT = 140 and alk phos = 290 so may have a hepatitis, e.g., CMV, etc.  May also have MAC.  Usual sites of infection not evident.  We are continuing prior meds plus vancomycin (although I do not see reason for this.  He has a good BP, renal fx and mentation so I doubt need for blind empiric antibiotics.  Will consult ID.

## 2012-05-12 NOTE — Progress Notes (Signed)
05/12/2012 2:53 PM  Report called to 3300 RN.  Pt transported via bed to [id number].  Eunice Blase

## 2012-05-12 NOTE — Progress Notes (Signed)
05/12/2012 5:46 PM Paged md regarding patients HR 130's and fever 103.1. No orders received. Will continue to monitor. Celesta Gentile

## 2012-05-12 NOTE — Progress Notes (Signed)
05/12/2012 3:50 PM Pt transferred from [id number]. Call bell with in reach. Will continue to monitor. Celesta Gentile

## 2012-05-12 NOTE — Progress Notes (Signed)
Interval Progress Note  Subjective  Zachary Hill states he is not feeling well and he just wants to get better. He had an episode of bilious vomiting earlier.  Objective -Patient's temp is 103, he is tachycardic to the 130's, panting shallow breaths with RR in mid-20's, 95% on room air. BP WNL -Pt appears uncomfortable, sitting up in bed, lungs CTAB, extremities warm  Plan -Added ceftriaxone, flagyl to antibiotic regimen, DC'd zosyn -add cooling blankets and use ibuprofen and tylenol for fever -send stool cultures, OandP, cryptosporidia -consider reassessing need for increased fluids during the night as patient has insensible losses

## 2012-05-12 NOTE — Progress Notes (Signed)
Pt HR in 140s, Temp. 103.2, Pt. Sleeping; MD notified; orders to give Tylenol suppository Q4 hours; will continue to monitor

## 2012-05-12 NOTE — Progress Notes (Signed)
Subjective: He continues for be febrile. Has a headache currently and has had watery diarrhea. He has had a productive cough.  He denies confusion, blurred vision, photophobia, diplopia, stiff neck, pain in his neck, chest pain, shortness of breath, abdominal pain, or dysuria.  Objective: Vital signs in last 24 hours: Filed Vitals:   05/12/12 1535 05/12/12 1600 05/12/12 1700 05/12/12 1743  BP: 114/66 104/57  124/70  Pulse: 125 129 126 128  Temp: 100.2 F (37.9 C)   103.1 F (39.5 C)  TempSrc: Oral   Oral  Resp: 37 35 42 31  Height:      Weight:      SpO2: 100% 99% 95% 98%   Weight change:   Intake/Output Summary (Last 24 hours) at 05/12/12 1900 Last data filed at 05/12/12 1800  Gross per 24 hour  Intake   2540 ml  Output      0 ml  Net   2540 ml   Vitals reviewed. General: resting in bed, tachycardic, febrile HEENT: no scleral icterus.  Cardiac: RRR, no rubs, murmurs or gallops Pulm: bibasilar crackles, no wheezes, rales, or rhonchi Abd: soft, nontender, nondistended, BS present Ext: warm and well perfused, no pedal edema Neuro: alert and oriented X3, cranial nerves II-XII grossly intact.    Lab Results: Basic Metabolic Panel:  Lab 05/12/12 1610 05/11/12 1159  NA 133* 134*  K 3.4* 3.6  CL 102 100  CO2 20 20  GLUCOSE 123* 104*  BUN 12 12  CREATININE 1.14 0.91  CALCIUM 8.8 9.5  MG -- --  PHOS -- --   Liver Function Tests:  Lab 05/12/12 0523 05/11/12 1159  AST 77* 85*  ALT 120* 138*  ALKPHOS 256* 293*  BILITOT 1.7* 1.3*  PROT 7.1 8.4*  ALBUMIN 3.2* 3.8    Lab 05/11/12 1347  LIPASE 52  AMYLASE --   No results found for this basename: AMMONIA:2 in the last 168 hours CBC:  Lab 05/12/12 0523 05/11/12 1159  WBC 8.9 9.8  NEUTROABS -- 8.2*  HGB 11.8* 13.2  HCT 35.4* 38.6*  MCV 80.1 79.4  PLT 100* 98*   Urinalysis:  Lab 05/11/12 1218  COLORURINE ORANGE*  LABSPEC 1.029  PHURINE 6.0  GLUCOSEU NEGATIVE  HGBUR NEGATIVE  BILIRUBINUR SMALL*    KETONESUR 15*  PROTEINUR 100*  UROBILINOGEN 4.0*  NITRITE NEGATIVE  LEUKOCYTESUR TRACE*    Micro Results: Recent Results (from the past 240 hour(s))  CULTURE, BLOOD (ROUTINE X 2)     Status: Normal (Preliminary result)   Collection Time   05/11/12  4:10 PM      Component Value Range Status Comment   Specimen Description BLOOD LEFT ARM   Final    Special Requests BOTTLES DRAWN AEROBIC AND ANAEROBIC 10CC   Final    Culture  Setup Time 05/11/2012 22:55   Final    Culture     Final    Value:        BLOOD CULTURE RECEIVED NO GROWTH TO DATE CULTURE WILL BE HELD FOR 5 DAYS BEFORE ISSUING A FINAL NEGATIVE REPORT   Report Status PENDING   Incomplete   CULTURE, BLOOD (ROUTINE X 2)     Status: Normal (Preliminary result)   Collection Time   05/11/12  4:25 PM      Component Value Range Status Comment   Specimen Description BLOOD LEFT HAND   Final    Special Requests BOTTLES DRAWN AEROBIC AND ANAEROBIC 10CC   Final    Culture  Setup Time 05/11/2012 22:55   Final    Culture     Final    Value:        BLOOD CULTURE RECEIVED NO GROWTH TO DATE CULTURE WILL BE HELD FOR 5 DAYS BEFORE ISSUING A FINAL NEGATIVE REPORT   Report Status PENDING   Incomplete   MRSA PCR SCREENING     Status: Normal   Collection Time   05/12/12  3:35 PM      Component Value Range Status Comment   MRSA by PCR NEGATIVE  NEGATIVE Final   CULTURE, EXPECTORATED SPUTUM-ASSESSMENT     Status: Normal   Collection Time   05/12/12  5:04 PM      Component Value Range Status Comment   Specimen Description SPUTUM   Final    Special Requests NONE   Final    Sputum evaluation     Final    Value: MICROSCOPIC FINDINGS SUGGEST THAT THIS SPECIMEN IS NOT REPRESENTATIVE OF LOWER RESPIRATORY SECRETIONS. PLEASE RECOLLECT.     CALLED TO MATTHEWS,M RN 05/12/12 1745 WOOTEN,K   Report Status 05/12/2012 FINAL   Final    Studies/Results: Dg Chest 2 View  05/12/2012  *RADIOLOGY REPORT*  Clinical Data: Fever  CHEST - 2 VIEW  Comparison: 05/11/2012   Findings: Normal cardiac silhouette and mediastinal contours.  Lung volumes are slightly reduced with minimal increase in bilateral infrahilar heterogeneous opacities.  No focal airspace opacity.  No definite pleural effusion or pneumothorax.  Unchanged bones.  IMPRESSION: Reduced lung volumes with worsening perihilar opacities favored to represent atelectasis.  No focal airspace opacities to suggest pneumonia.  Original Report Authenticated By: Waynard Reeds, M.D.   Dg Chest 2 View  05/11/2012  *RADIOLOGY REPORT*  Clinical Data: Fever.  Nausea vomiting.  CHEST - 2 VIEW  Comparison: None.  Findings: Cardiomediastinal silhouette unremarkable.  Lungs clear. Bronchovascular markings normal.  Pulmonary vascularity normal.  No pleural effusions.  No pneumothorax.  Visualized bony thorax intact.  IMPRESSION: Normal examination.  Original Report Authenticated By: Arnell Sieving, M.D.   Ct Head Wo Contrast  05/12/2012  *RADIOLOGY REPORT*  Clinical Data: Headache.  Diarrhea.  Fever.  HIV.  CT HEAD WITHOUT CONTRAST  Technique:  Contiguous axial images were obtained from the base of the skull through the vertex without contrast.  Comparison: None.  Findings: The brain stem, cerebellum, cerebral peduncles, thalami, basal ganglia, basilar cisterns, and ventricular system appear unremarkable.  No intracranial hemorrhage, mass lesion, or acute infarction is identified.  Chronic bilateral maxillary and ethmoid sinusitis noted.  IMPRESSION:  1.  Chronic bilateral maxillary ethmoid sinusitis.   Otherwise, no significant abnormality identified.  Original Report Authenticated By: Dellia Cloud, M.D.   US Abdomen Complete  05/11/2012  *RADIOLOGY REPORT*  Clinical Data:  Nausea/vomiting, abdominal pain  COMPLETE ABDOMINAL ULTRASOUND  Comparison:  None.  Findings:  Gallbladder:  Contracted gallbladder, likely related to nonfasting state.  No gallstones or pericholecystic fluid.  Common bile duct:  Measures 5 mm.   Liver:  No focal lesion identified.  Within normal limits in parenchymal echogenicity.  IVC:  Appears normal.  Pancreas:  Poorly visualized but grossly unremarkable.  Spleen:  Measures 6.4 cm.  Right Kidney:  Measures 1.4 cm.  No mass or hydronephrosis.  Left Kidney:  Measures 11.3 cm.  No mass or hydronephrosis.  Abdominal aorta:  Poorly visualized.  No aneurysm identified.  IMPRESSION: Contracted gallbladder.  No findings to suggest acute cholecystitis.  Otherwise negative abdominal ultrasound.  Original  Report Authenticated By: Charline Bills, M.D.   Ct Abdomen Pelvis W Contrast  05/12/2012  *RADIOLOGY REPORT*  Clinical Data: Fever.  Diarrhea.  Headache.  HIV.  Hepatitis B.  CT ABDOMEN AND PELVIS WITH CONTRAST  Technique:  Multidetector CT imaging of the abdomen and pelvis was performed following the standard protocol during bolus administration of intravenous contrast.  Contrast: OMNIPAQUE IOHEXOL 300 MG/ML  SOLN  Comparison: 05/11/2012  Findings: Linear subsegmental atelectasis present in both lower lobes.  Contracted gallbladder noted.  The liver, spleen, adrenal glands, and pancreas appear unremarkable.  The kidneys appear unremarkable, as do the proximal ureters.  Small porta hepatis, peripancreatic, and retroperitoneal lymph nodes are not pathologically enlarged by size criteria.  Air-fluid levels are present throughout the distal colon, characteristic for diarrheal process.  Fluid filled loops of small bowel are not dilated.  Scattered small mesenteric lymph nodes are not pathologically enlarged but are increased in number and may be reactive.  No significant colon wall thickening is observed.  No pelvic ascites.  The appendix appears normal.  IMPRESSION:  1.  Air fluid levels in nondilated small bowel and throughout the colon including the distal colon, suggesting diarrheal process and possible enteritis.  No significant abnormal bowel wall thickening is observed.  Original Report Authenticated  By: Dellia Cloud, M.D.   Medications: I have reviewed the patient's current medications. Scheduled Meds:   . acetaminophen      . azithromycin  1,200 mg Oral Q7 days  . cefTRIAXone (ROCEPHIN)  IV  2 g Intravenous Q24H  . dapsone  100 mg Oral Daily  . enoxaparin (LOVENOX) injection  40 mg Subcutaneous Q24H  . fluconazole (DIFLUCAN) IV  200 mg Intravenous Q24H  . metronidazole  500 mg Intravenous Q8H  . senna  1 tablet Oral BID  . vancomycin  1,000 mg Intravenous Q8H  . DISCONTD: fluconazole (DIFLUCAN) IV  400 mg Intravenous Q24H  . DISCONTD: fluconazole  200 mg Oral Daily  . DISCONTD: piperacillin-tazobactam (ZOSYN)  IV  3.375 g Intravenous Q8H   Continuous Infusions:   . sodium chloride 1,000 mL (05/12/12 1501)  . DISCONTD: sodium chloride 1,000 mL (05/11/12 1734)   PRN Meds:.acetaminophen, acetaminophen, ibuprofen, iohexol, ondansetron (ZOFRAN) IV, ondansetron, DISCONTD: acetaminophen, DISCONTD: acetaminophen Assessment/Plan: 77 man with HIV, off ART for a year and now with CD4 < 10. Back with Dr. Drue Second and on dapsone, azithromycin, and diflucan pending ADAP to resume ART. Adm for tepm of 103 w/o localizing site of infection. Has thrush but on rx for > a week. 1. SIRS/ Fever: Given CD4 count 10 concern for sepsis from MAC versus bacteremia is valid. No acute evidence for pneumonia even with repeat CXR in AM after fluids to rule out PCP. Urine unremarkable. Blood cultures times 2 drawn in ED.  Vanc and Zosyn started last night. Pt with persistent fever, tachycardia, tacpnea this morning. New headache but no signs or symptoms concerning for meningitis or encephalitis. Dr. Drue Second following closely. Pt with low threshold for LP for CSF analysis.  - Transfer to stepdown - Given immunosupression will continue broad spectrum of vancomycin and zosyn - Repeat CXR in AM  - Follow cultures  - Ordered: cryptococcal antigen serum, LDH, AFB blood culture, sputum for culture and  pneumocystis.  - Repeat CT head and abdominal US  2. Nausea/vomiting: Concern for bacterial vs viral illness given weakened immune system. CXR unremarkable and US abdomen unremarkable. Pt with no reported vomiting during the day.  - Zofran for  nausea  - NS @ 150 cc/hr overnight for some volume loss  - Full diet if tolerated   3. AIDS: CD4 last 10, has been off ARVs for some time now and is in process of getting back on medications. Will hold ARVs overnight over concern of possible side effects masking clinical picture. No concern for IRIS as he has not started meds yet.  - PPX with dapsone and azithromycin  - Tx for possible candidal esophagitis with fluconazole for 2 weeks started 7/30   4. Pruritis: No rashes see on physical exam. Sx could be from Dapsone. Will continue to monitor.   5. DVT ppx: Lovenox [place] daily      LOS: 1 day   Ky Barban 05/12/2012, 7:00 PM

## 2012-05-12 NOTE — Consult Note (Addendum)
Infectious Diseases Initial Consultation  Reason for Consultation:  Fever in HIV patient with CD 4 count < 10, not currently on ART   HPI: Zachary Hill is a 31 y.o. male with HIV, current CD 4 count of <10, VL 475,526 ( July 2013), previously on truvada/darunavir/ritonavir, but stopped ART in year ago due to "feeling fine". Seen in at the [facility] at end of July to re-enter care, at that time complained of dysphagia and had thrush on exam. He was started on fluconazole for candidal esophagitis and OI prophylaxis with bactrim and azithromycin. The patient states that he had side effects of rash with bactrim thus stopped and was transitioned to dapsone. His dysphagia improved with fluconazole. He now reports having a week's worth of  Fevers/chills/nightsweats, nausea and vomiting, with complaint of dark urine. He has epigastric pain, as well, he believes that it is due to vomiting. Not associated with food. He was seen in ED on 8/10 and admitted due to high fevers of 103, leukocytosis of 9.8 with left shift of 84%N,up from his baseline of 3.4. His cxr was unrevealing as was his RUQ u/s. He was empirically started on vanco, piptazo, and continued on OI proph of fluconazole, dapsone and azithromycin. In the first 24hrs of his admission, he has not defervesced. SIRS with temp, tachycardia, tachypnea. He is now having diarrhea.  Past Medical History  Diagnosis Date  . HIV disease   . Hepatitis B     Allergies:  Allergies  Allergen Reactions  . Fruit & Vegetable Daily (Nutritional Supplements)     Allergic to RAW fruits and vegetables .  Marland Kitchen Bactrim (Sulfamethoxazole W-Trimethoprim) Itching, Rash and Other (See Comments)    "flu like symptoms"    Current antibiotics: total abtx days #2 vanco #2 piptazo #2 Dapsone #2 fluc 200 #2 azithro Q 7days  MEDICATIONS:    . sodium chloride  1,000 mL Intravenous Once  . acetaminophen      . acetaminophen  650 mg Rectal Once  . azithromycin  1,200 mg Oral  Q7 days  . dapsone  100 mg Oral Daily  . enoxaparin (LOVENOX) injection  40 mg Subcutaneous Q24H  . fluconazole  200 mg Oral Daily  . gi cocktail  30 mL Oral Once  . ketorolac  30 mg Intravenous Once  . ondansetron  4 mg Intravenous Once  . piperacillin-tazobactam (ZOSYN)  IV  3.375 g Intravenous Q8H  . promethazine  12.5 mg Intravenous Once  . senna  1 tablet Oral BID  . sodium chloride  1,000 mL Intravenous Once  . vancomycin  1,000 mg Intravenous Q8H  . DISCONTD: acetaminophen  80 mg Rectal Once    History  Substance Use Topics  . Smoking status: Current Everyday Smoker -- 0.2 packs/day    Types: Cigarettes  . Smokeless tobacco: Never Used  . Alcohol Use: 1.5 oz/week    3 drink(s) per week     red wine , social     History reviewed. No pertinent family history. Review of Systems  Constitutional: positive for fever, chills, diaphoresis, activity change, appetite change, fatigue and unexpected weight change.  HENT: Negative for congestion, sore throat, rhinorrhea, sneezing, trouble swallowing and sinus pressure.  Eyes: Negative for photophobia and visual disturbance.  Respiratory: Negative for cough, chest tightness, shortness of breath, wheezing and stridor.  Cardiovascular: Negative for chest pain, palpitations and leg swelling.  Gastrointestinal: positive for nausea, vomiting, abdominal pain, diarrhea, no constipation, blood in stool, abdominal distention and anal bleeding.  Genitourinary: Negative for dysuria, hematuria, flank pain and difficulty urinating.  Musculoskeletal: Negative for myalgias, back pain, joint swelling, arthralgias and gait problem.  Skin: Negative for color change, pallor, rash and wound.  Neurological: Negative for dizziness, tremors, weakness and light-headedness.  Hematological: Negative for adenopathy. Does not bruise/bleed easily.  Psychiatric/Behavioral: Negative for behavioral problems, confusion, sleep disturbance, dysphoric mood, decreased  concentration and agitation.    OBJECTIVE: Temp:  [99.3 F (37.4 C)-103 F (39.4 C)] 103 F (39.4 C) (08/11 0927) Pulse Rate:  [100-116] 116  (08/11 0927) Resp:  [18-20] 19  (08/11 0927) BP: (114-135)/(55-82) 114/71 mmHg (08/11 0927) SpO2:  [97 %-99 %] 98 % (08/11 0927) Weight:  [192 lb 8 oz (87.317 kg)-198 lb 3.2 oz (89.903 kg)] 198 lb 3.2 oz (89.903 kg) (08/10 2150)  Blood pressure 135/74, pulse 111, temperature 102.8 F (39.3 C), temperature source Oral, resp. rate 20, height 5\' 6"  (1.676 m), weight 192 lb 8 oz (87.317 kg), SpO2 98.00%.  Constitutional: Patient is a well-developed and well-nourished male ill appearing. Alert and oriented x3.  Head: Normocephalic and atraumatic : No erythema or exudates, MMM  Eyes: PERRL, EOMI, conjunctivae injected bilaterally, No scleral icterus.  Neck: Supple, Trachea midline normal ROM, No JVD, mass, thyromegaly, or carotid bruit present.  Cardiovascular: Mild tachycardia, regular rhythm, S1 normal, S2 normal, no g/m/r, pulses symmetric and intact bilaterally  Pulmonary/Chest: Diminished breath sounds diffusely, no appreciable wheezes, rales, or rhonchi  Abdominal: Soft. TTP in mid epigastrium and RUQ inferior to rib cage, non-distended, bowel sounds are normal, no masses, organomegaly, or guarding present.  GU: Mild CVA tenderness. No rash or ulcerations seen on penis or scrotum. Musculoskeletal: No joint deformities, erythema, or stiffness, ROM full and no nontender Neurological: A&O x3, Strength is normal and symmetric bilaterally, cranial nerve II-XII are grossly intact, no focal motor deficit, sensory intact to light touch bilaterally.  Skin: Warm, dry and intact. No rash, cyanosis, or clubbing.    LABS: Results for orders placed during the hospital encounter of 05/11/12 (from the past 48 hour(s))  CBC WITH DIFFERENTIAL     Status: Abnormal   Collection Time   05/11/12 11:59 AM      Component Value Range Comment   WBC 9.8  4.0 - 10.5 K/uL     RBC 4.86  4.22 - 5.81 MIL/uL    Hemoglobin 13.2  13.0 - 17.0 g/dL    HCT 10.2 (*) 72.5 - 52.0 %    MCV 79.4  78.0 - 100.0 fL    MCH 27.2  26.0 - 34.0 pg    MCHC 34.2  30.0 - 36.0 g/dL    RDW 36.6  44.0 - 34.7 %    Platelets 98 (*) 150 - 400 K/uL PLATELET COUNT CONFIRMED BY SMEAR   Neutrophils Relative 84 (*) 43 - 77 %    Neutro Abs 8.2 (*) 1.7 - 7.7 K/uL    Lymphocytes Relative 10 (*) 12 - 46 %    Lymphs Abs 0.9  0.7 - 4.0 K/uL    Monocytes Relative 6  3 - 12 %    Monocytes Absolute 0.5  0.1 - 1.0 K/uL    Eosinophils Relative 1  0 - 5 %    Eosinophils Absolute 0.1  0.0 - 0.7 K/uL    Basophils Relative 0  0 - 1 %    Basophils Absolute 0.0  0.0 - 0.1 K/uL   COMPREHENSIVE METABOLIC PANEL     Status: Abnormal   Collection Time  05/11/12 11:59 AM      Component Value Range Comment   Sodium 134 (*) 135 - 145 mEq/L    Potassium 3.6  3.5 - 5.1 mEq/L    Chloride 100  96 - 112 mEq/L    CO2 20  19 - 32 mEq/L    Glucose, Bld 104 (*) 70 - 99 mg/dL    BUN 12  6 - 23 mg/dL    Creatinine, Ser 5.62  0.50 - 1.35 mg/dL    Calcium 9.5  8.4 - 13.0 mg/dL    Total Protein 8.4 (*) 6.0 - 8.3 g/dL    Albumin 3.8  3.5 - 5.2 g/dL    AST 85 (*) 0 - 37 U/L    ALT 138 (*) 0 - 53 U/L    Alkaline Phosphatase 293 (*) 39 - 117 U/L    Total Bilirubin 1.3 (*) 0.3 - 1.2 mg/dL    GFR calc non Af Amer >90  >90 mL/min    GFR calc Af Amer >90  >90 mL/min   URINALYSIS, ROUTINE W REFLEX MICROSCOPIC     Status: Abnormal   Collection Time   05/11/12 12:18 PM      Component Value Range Comment   Color, Urine ORANGE (*) YELLOW BIOCHEMICALS MAY BE AFFECTED BY COLOR   APPearance CLEAR  CLEAR    Specific Gravity, Urine 1.029  1.005 - 1.030    pH 6.0  5.0 - 8.0    Glucose, UA NEGATIVE  NEGATIVE mg/dL    Hgb urine dipstick NEGATIVE  NEGATIVE    Bilirubin Urine SMALL (*) NEGATIVE    Ketones, ur 15 (*) NEGATIVE mg/dL    Protein, ur 865 (*) NEGATIVE mg/dL    Urobilinogen, UA 4.0 (*) 0.0 - 1.0 mg/dL    Nitrite  NEGATIVE  NEGATIVE    Leukocytes, UA TRACE (*) NEGATIVE   URINE MICROSCOPIC-ADD ON     Status: Normal   Collection Time   05/11/12 12:18 PM      Component Value Range Comment   Squamous Epithelial / LPF RARE  RARE    WBC, UA 0-2  <3 WBC/hpf   LIPASE, BLOOD     Status: Normal   Collection Time   05/11/12  1:47 PM      Component Value Range Comment   Lipase 52  11 - 59 U/L   LACTIC ACID, PLASMA     Status: Normal   Collection Time   05/11/12  1:56 PM      Component Value Range Comment   Lactic Acid, Venous 1.2  0.5 - 2.2 mmol/L   CULTURE, BLOOD (ROUTINE X 2)     Status: Normal (Preliminary result)   Collection Time   05/11/12  4:10 PM      Component Value Range Comment   Specimen Description BLOOD LEFT ARM      Special Requests BOTTLES DRAWN AEROBIC AND ANAEROBIC 10CC      Culture  Setup Time 05/11/2012 22:55      Culture        Value:        BLOOD CULTURE RECEIVED NO GROWTH TO DATE CULTURE WILL BE HELD FOR 5 DAYS BEFORE ISSUING A FINAL NEGATIVE REPORT   Report Status PENDING     CULTURE, BLOOD (ROUTINE X 2)     Status: Normal (Preliminary result)   Collection Time   05/11/12  4:25 PM      Component Value Range Comment   Specimen Description BLOOD LEFT HAND  Special Requests BOTTLES DRAWN AEROBIC AND ANAEROBIC 10CC      Culture  Setup Time 05/11/2012 22:55      Culture        Value:        BLOOD CULTURE RECEIVED NO GROWTH TO DATE CULTURE WILL BE HELD FOR 5 DAYS BEFORE ISSUING A FINAL NEGATIVE REPORT   Report Status PENDING     CBC     Status: Abnormal   Collection Time   05/12/12  5:23 AM      Component Value Range Comment   WBC 8.9  4.0 - 10.5 K/uL    RBC 4.42  4.22 - 5.81 MIL/uL    Hemoglobin 11.8 (*) 13.0 - 17.0 g/dL    HCT 16.1 (*) 09.6 - 52.0 %    MCV 80.1  78.0 - 100.0 fL    MCH 26.7  26.0 - 34.0 pg    MCHC 33.3  30.0 - 36.0 g/dL    RDW 04.5  40.9 - 81.1 %    Platelets 100 (*) 150 - 400 K/uL CONSISTENT WITH PREVIOUS RESULT  COMPREHENSIVE METABOLIC PANEL      Status: Abnormal   Collection Time   05/12/12  5:23 AM      Component Value Range Comment   Sodium 133 (*) 135 - 145 mEq/L    Potassium 3.4 (*) 3.5 - 5.1 mEq/L    Chloride 102  96 - 112 mEq/L    CO2 20  19 - 32 mEq/L    Glucose, Bld 123 (*) 70 - 99 mg/dL    BUN 12  6 - 23 mg/dL    Creatinine, Ser 9.14  0.50 - 1.35 mg/dL    Calcium 8.8  8.4 - 78.2 mg/dL    Total Protein 7.1  6.0 - 8.3 g/dL    Albumin 3.2 (*) 3.5 - 5.2 g/dL    AST 77 (*) 0 - 37 U/L    ALT 120 (*) 0 - 53 U/L    Alkaline Phosphatase 256 (*) 39 - 117 U/L    Total Bilirubin 1.7 (*) 0.3 - 1.2 mg/dL    GFR calc non Af Amer 84 (*) >90 mL/min    GFR calc Af Amer >90  >90 mL/min   LACTATE DEHYDROGENASE     Status: Normal   Collection Time   05/12/12 12:34 PM      Component Value Range Comment   LDH 232  94 - 250 U/L     MICRO:  IMAGING: Dg Chest 2 View  05/12/2012  *RADIOLOGY REPORT*  Clinical Data: Fever  CHEST - 2 VIEW  Comparison: 05/11/2012  Findings: Normal cardiac silhouette and mediastinal contours.  Lung volumes are slightly reduced with minimal increase in bilateral infrahilar heterogeneous opacities.  No focal airspace opacity.  No definite pleural effusion or pneumothorax.  Unchanged bones.  IMPRESSION: Reduced lung volumes with worsening perihilar opacities favored to represent atelectasis.  No focal airspace opacities to suggest pneumonia.  Original Report Authenticated By: Waynard Reeds, M.D.   Dg Chest 2 View  05/11/2012  *RADIOLOGY REPORT*  Clinical Data: Fever.  Nausea vomiting.  CHEST - 2 VIEW  Comparison: None.  Findings: Cardiomediastinal silhouette unremarkable.  Lungs clear. Bronchovascular markings normal.  Pulmonary vascularity normal.  No pleural effusions.  No pneumothorax.  Visualized bony thorax intact.  IMPRESSION: Normal examination.  Original Report Authenticated By: Arnell Sieving, M.D.   US Abdomen Complete  05/11/2012  *RADIOLOGY REPORT*  Clinical Data:  Nausea/vomiting, abdominal  pain   COMPLETE ABDOMINAL ULTRASOUND  Comparison:  None.  Findings:  Gallbladder:  Contracted gallbladder, likely related to nonfasting state.  No gallstones or pericholecystic fluid.  Common bile duct:  Measures 5 mm.  Liver:  No focal lesion identified.  Within normal limits in parenchymal echogenicity.  IVC:  Appears normal.  Pancreas:  Poorly visualized but grossly unremarkable.  Spleen:  Measures 6.4 cm.  Right Kidney:  Measures 1.4 cm.  No mass or hydronephrosis.  Left Kidney:  Measures 11.3 cm.  No mass or hydronephrosis.  Abdominal aorta:  Poorly visualized.  No aneurysm identified.  IMPRESSION: Contracted gallbladder.  No findings to suggest acute cholecystitis.  Otherwise negative abdominal ultrasound.  Original Report Authenticated By: Charline Bills, M.D.    Assessment/Plan:  Gloyd is 31yo Male with HIV/AIDS, CD 4 count < 10, VL 475,526 presents with fever, mostly GI symptoms but now complains of dry cough, tension headache and diarrhea. In an immunocompromised host, this can represent more than one illness. DDx can be MAC, disseminated candidiasis, cryptococcal disease, and rarer dx of hemophagocytosis lymphohistiocytosis(HLH). Some of his symptoms prior to admit seem consistent with gastroenteritis with dehydration. While his LFTs are midly elevated, his bilirubin is normal, no signs of obstructive cholangitis  1) fevers = in setting with n/v in some who has CD 4 count < 100 can be disseminated MAC. Collect afb culture now and tomorrow. If abd/ct shows superficial lymphadenopathy that can be bx'd by IR ,would send tissue for culture. Depending on work-up today and if patient continues to have fever, we will consider to empirically treating for MAC. For now we will wait to initiate EMB and azithromycin  Please check: - cryptococcal antigen serum - ferritin, LDH, cholesterol panel(for triglycerides) - if all are highly elevated, then can possibly meet definition for HLH - AFb blood culture in  AM -  2) cough = work up for PJP. LDH pending, consider getting ABG if patient appears hypoxic, increase WOB. - please send sputum for culture, and pneumocystis - low suspicion for tb, would check quantiferon gold  - can continue with vanco and piptazo for now  3) diarrhea - check stool culture, cryptosporidium/giardia elisa - can check for CMV IgM, IgG  4) tension headache = mostly frontal discomfort, no nuchal rigidity. in setting of fever in HIV patient, would have low threshold for NCHCT, and LP to check CSF. - a negative serum Cryptococcal Antigen would be reassuring - CSF-> send for csf profile, aerobic cx, cryptococcal antigen  5) epigastric pain - could represent candidal esophagitis - change oral fluconazole to fluconazole 200mg  IV since he has nausea  6) OI proph   - continue with dapsone -> will change to treatment doses for PCP if the patient has more convincing evidence for pulmonary infection. Await lab/abg results  - continue with weekly azithro  7) HIV/AIDS- we were planning on placing him back on his prior regimen. Will discuss with clinic case managers if can get him to have patient assistance program so that we can start hiv regimen ASAP. For some reasons that I am unclear with, the patient is having difficulty getting the paperwork needed to do his ADAP application  Dr. Zenaida Niece dam to provide further recs in the morning.  D[facility] Salvia [facility] MD MPH [facility] [phone] cell

## 2012-05-12 NOTE — Progress Notes (Signed)
05/12/2012 11:21 AM  Pt temperature check around 0815 during assessment.  Pt temp is 102.8 orally.  Dr. Tonny Branch notified, no orders received.  Will continue to monitor patient. Eunice Blase

## 2012-05-12 NOTE — ED Provider Notes (Signed)
Medical screening examination/treatment/procedure(s) were performed by non-physician practitioner and as supervising physician I was immediately available for consultation/collaboration.  Derwood Kaplan, MD 05/12/12 1722

## 2012-05-13 ENCOUNTER — Encounter (HOSPITAL_COMMUNITY): Payer: Self-pay | Admitting: *Deleted

## 2012-05-13 ENCOUNTER — Inpatient Hospital Stay (HOSPITAL_COMMUNITY): Payer: Medicaid Other

## 2012-05-13 ENCOUNTER — Encounter (HOSPITAL_COMMUNITY)
Admission: EM | Discharge status: Against Medical Advice | Payer: Self-pay | Source: Home / Self Care | Attending: Internal Medicine

## 2012-05-13 DIAGNOSIS — R1310 Dysphagia, unspecified: Secondary | ICD-10-CM | POA: Diagnosis present

## 2012-05-13 HISTORY — PX: ESOPHAGOGASTRODUODENOSCOPY: SHX5428

## 2012-05-13 LAB — URINE MICROSCOPIC-ADD ON

## 2012-05-13 LAB — URINALYSIS, ROUTINE W REFLEX MICROSCOPIC
Glucose, UA: NEGATIVE mg/dL
Ketones, ur: 15 mg/dL — AB
Specific Gravity, Urine: 1.027 (ref 1.005–1.030)
pH: 5 (ref 5.0–8.0)

## 2012-05-13 LAB — BASIC METABOLIC PANEL
CO2: 14 mEq/L — ABNORMAL LOW (ref 19–32)
CO2: 17 mEq/L — ABNORMAL LOW (ref 19–32)
Chloride: 102 mEq/L (ref 96–112)
GFR calc Af Amer: 28 mL/min — ABNORMAL LOW (ref >90)
Glucose, Bld: 97 mg/dL (ref 70–99)
Potassium: 3.1 mEq/L — ABNORMAL LOW (ref 3.5–5.1)
Potassium: 3.2 mEq/L — ABNORMAL LOW (ref 3.5–5.1)
Sodium: 134 mEq/L — ABNORMAL LOW (ref 135–145)
Sodium: 137 mEq/L (ref 135–145)

## 2012-05-13 LAB — BLOOD GAS, ARTERIAL
Acid-base deficit: 9.7 mmol/L — ABNORMAL HIGH (ref 0.0–2.0)
Drawn by: 20517
O2 Saturation: 96.2 %
Patient temperature: 102

## 2012-05-13 LAB — EXPECTORATED SPUTUM ASSESSMENT W GRAM STAIN, RFLX TO RESP C

## 2012-05-13 LAB — TOXOPLASMA ANTIBODIES- IGG AND  IGM: Toxoplasma Antibody- IgM: <3 IU/mL (ref <8.0)

## 2012-05-13 LAB — CBC
MCV: 79.1 fL (ref 78.0–100.0)
Platelets: 83 10*3/uL — ABNORMAL LOW (ref 150–400)
RBC: 4.44 MIL/uL (ref 4.22–5.81)
WBC: 14.8 10*3/uL — ABNORMAL HIGH (ref 4.0–10.5)

## 2012-05-13 LAB — SODIUM, URINE, RANDOM: Sodium, Ur: 52 mEq/L

## 2012-05-13 SURGERY — EGD (ESOPHAGOGASTRODUODENOSCOPY)
Anesthesia: Moderate Sedation

## 2012-05-13 MED ORDER — ONDANSETRON HCL 4 MG/2ML IJ SOLN
INTRAMUSCULAR | Status: AC
  Filled 2012-05-13: qty 2

## 2012-05-13 MED ORDER — FENTANYL CITRATE 0.05 MG/ML IJ SOLN
INTRAMUSCULAR | Status: AC
  Filled 2012-05-13: qty 4

## 2012-05-13 MED ORDER — MIDAZOLAM HCL 10 MG/2ML IJ SOLN
INTRAMUSCULAR | Status: DC | PRN
  Administered 2012-05-13 (×3): 2.5 mg via INTRAVENOUS

## 2012-05-13 MED ORDER — AZITHROMYCIN 600 MG PO TABS
600.0000 mg | ORAL_TABLET | Freq: Every day | ORAL | Status: DC
  Administered 2012-05-13 – 2012-05-20 (×8): 600 mg via ORAL
  Filled 2012-05-13 (×8): qty 1

## 2012-05-13 MED ORDER — SODIUM CHLORIDE 0.9 % IV BOLUS (SEPSIS)
2000.0000 mL | Freq: Once | INTRAVENOUS | Status: AC
  Administered 2012-05-13: 2000 mL via INTRAVENOUS

## 2012-05-13 MED ORDER — SODIUM CHLORIDE 0.9 % IV BOLUS (SEPSIS)
1000.0000 mL | Freq: Once | INTRAVENOUS | Status: AC
  Administered 2012-05-13: 1000 mL via INTRAVENOUS

## 2012-05-13 MED ORDER — SODIUM CHLORIDE 0.9 % IV SOLN: INTRAVENOUS | Status: DC

## 2012-05-13 MED ORDER — BUTAMBEN-TETRACAINE-BENZOCAINE 2-2-14 % EX AERO
INHALATION_SPRAY | CUTANEOUS | Status: DC | PRN
  Administered 2012-05-13: 2 via TOPICAL

## 2012-05-13 MED ORDER — FENTANYL CITRATE 0.05 MG/ML IJ SOLN
INTRAMUSCULAR | Status: DC | PRN
  Administered 2012-05-13 (×3): 25 ug via INTRAVENOUS

## 2012-05-13 MED ORDER — ONDANSETRON HCL 4 MG/2ML IJ SOLN
INTRAMUSCULAR | Status: DC | PRN
  Administered 2012-05-13: 4 mg via INTRAVENOUS

## 2012-05-13 MED ORDER — MIDAZOLAM HCL 10 MG/2ML IJ SOLN
INTRAMUSCULAR | Status: AC
  Filled 2012-05-13: qty 4

## 2012-05-13 NOTE — Brief Op Note (Signed)
  NO visible evidence of CMV, HSV esophagitis. Viral and fungal cultures taken and biopsies for histology completed.  See Endopro for details.   Clear liquid diet.

## 2012-05-13 NOTE — Progress Notes (Signed)
IM Attending  Continues to have temp 102 all night and dysphagia/nausea, and headache.  On rx for thrush, MAC, and PCP.  Also on ceftriaxone.  Head CT w/o contrast was negative.  Has raw pharynx -- ? of CMV or herpes.  Appreciate ID consult.

## 2012-05-13 NOTE — Progress Notes (Addendum)
Patient ID: Zachary Hill, male   DOB: 04-22-1981, 31 y.o.   MRN: [id number] Full consult to follow. EGD postponed til later this afternoon since patient was not NPO.

## 2012-05-13 NOTE — Op Note (Signed)
[facility] [address] [place], [place]  [address]  ENDOSCOPY PROCEDURE REPORT  PATIENT:  Zachary Hill, Zachary Hill  MR#:  [id number] BIRTHDATE:  [birth year], 30 yrs. old  GENDER:  male  ENDOSCOPIST:  Charlott Rakes, MD Referred by:  PROCEDURE DATE:  05/13/2012 PROCEDURE:  EGD with biopsy, 43239 ASA CLASS:  Class IV INDICATIONS:   odynophagia, nausea/vomiting  MEDICATIONS:  Fentanyl 75 mcg IV, Versed 7.5 mg IV, Zofran 4 mg IV  TOPICAL ANESTHETIC:  Cetacaine spray X 2  DESCRIPTION OF PROCEDURE:   After the risks benefits and alternatives of the procedure were thoroughly explained, informed consent was obtained.  The Pentax Gastroscope [id number] endoscope was introduced through the mouth and advanced to the , without limitations.  The instrument was slowly withdrawn as the mucosa was fully examined. <<PROCEDUREIMAGES>>  FINDINGS:  The endoscope was inserted into the oropharynx and esophagus was intubated.  The esophagus was normal in its entirety with no visible inflammation noted. The gastroesophageal junction was noted to be 40 cm from the incisors. Endoscope was advanced into the stomach which revealed minimal erythematous streaks in the distal stomach and a superficial ulceration.  The endoscope was advanced into the duodenal bulb which revealed patchy erythema and edema consistent with duodenitis. The second portion of duodenum was unremarkable.  The endoscope was withdrawn back into the stomach and retroflexion was unremarkable. Biopsies were taken of the duodenum and stomach for histologic purposes and also to send for CMV. Esophageal biopsies were taken for histologic purposes as well as to send for viral and fungal cultures.  COMPLICATIONS:  None  ENDOSCOPIC IMPRESSION:  1. Mild duodenitis and gastritis- s/p biopsies 2. Normal endoscopic appearance of esophagus - s/p biopsies  RECOMMENDATIONS:    1. Continue supportive care and resume previous  diet 2. Follow up on viral/fungal cultures and histology  REPEAT EXAM:  N/A  ______________________________ Charlott Rakes, MD  CC:  n. eSIGNEDCharlott Rakes at 05/13/2012 04:59 PM  Ovidio Hanger, [phone]

## 2012-05-13 NOTE — Progress Notes (Signed)
Utilization review completed.  

## 2012-05-13 NOTE — H&P (View-Only) (Signed)
Patient ID: Zachary Hill, male   DOB: 12/13/1980, 30 y.o.   MRN: [id number] Referring Provider: Dr. Rogers Primary Care Physician:  SNIDER, CYNTHIA, MD Primary Gastroenterologist:  Unassigned  Reason for Consultation:  Odynophagia; Nausea and vomiting  HPI: Zachary Hill is a 30 y.o. male with HIV AIDS who has been having fever and odynophagia for the past 5-7 days. He states that he has not been able to eat or drink without pain since last Wednesday. Pain in throat even without swallowing and recurrent bilious vomiting. CD4 10. Fevers 103. He stopped his antiretrovirals at some point prior to admit and saw Dr. Snider at the end of July with painful swallowing and cold-like symptoms. No improvement with outpt treatment and came in to the ER on August 10th for further eval. In addition to N/V/F/C he has been SOB with a cough. No previous history of HSV/CMV esophagitis. Currently on Azithromycin, Flagyl, Diflucan, Dapsone, Ceftriaxone, and Vancomycin.    Past Medical History  Diagnosis Date  . HIV disease   . Hepatitis B     History reviewed. No pertinent past surgical history.  Prior to Admission medications   Medication Sig Start Date End Date Taking? Authorizing Provider  azithromycin (ZITHROMAX) 600 MG tablet Take 2 tablets (1,200 mg total) by mouth every 7 (seven) days. 05/10/12 06/09/12 Yes Cynthia Snider, MD  dapsone 100 MG tablet Take 1 tablet (100 mg total) by mouth daily. 05/10/12  Yes Cynthia Snider, MD  darunavir (PREZISTA) 600 MG tablet Take 1 tablet (600 mg total) by mouth 2 (two) times daily with a meal. 05/10/12 05/10/13 Yes Cynthia Snider, MD  emtricitabine-tenofovir (TRUVADA) 200-300 MG per tablet Take 1 tablet by mouth daily. 05/10/12 05/10/13 Yes Cynthia Snider, MD  fluconazole (DIFLUCAN) 200 MG tablet Take 200 mg by mouth daily.   Yes Historical Provider, MD  ritonavir (NORVIR) 100 MG TABS Take 1 tablet (100 mg total) by mouth daily. 05/10/12  Yes Cynthia Snider, MD    Scheduled  Meds:   . azithromycin  600 mg Oral Daily  . cefTRIAXone (ROCEPHIN)  IV  2 g Intravenous Q24H  . dapsone  100 mg Oral Daily  . enoxaparin (LOVENOX) injection  40 mg Subcutaneous Q24H  . ethambutol  15 mg/kg Oral Daily  . fluconazole (DIFLUCAN) IV  200 mg Intravenous Q24H  . metronidazole  500 mg Intravenous Q8H  . senna  1 tablet Oral BID  . sodium chloride  1,000 mL Intravenous Once  . vancomycin  1,000 mg Intravenous Q8H  . DISCONTD: azithromycin  1,200 mg Oral Q7 days  . DISCONTD: azithromycin  250 mg Oral Daily  . DISCONTD: azithromycin  500 mg Oral Daily  . DISCONTD: fluconazole (DIFLUCAN) IV  400 mg Intravenous Q24H  . DISCONTD: fluconazole  200 mg Oral Daily  . DISCONTD: piperacillin-tazobactam (ZOSYN)  IV  3.375 g Intravenous Q8H   Continuous Infusions:   . sodium chloride 1,000 mL (05/13/12 0331)  . DISCONTD: sodium chloride 1,000 mL (05/11/12 1734)  . DISCONTD: sodium chloride 1,000 mL (05/12/12 1501)   PRN Meds:.acetaminophen, acetaminophen, ibuprofen, iohexol, ondansetron (ZOFRAN) IV, ondansetron, promethazine, DISCONTD: acetaminophen, DISCONTD: acetaminophen, DISCONTD: acetaminophen, DISCONTD: acetaminophen  Allergies as of 05/11/2012 - Review Complete 05/11/2012  Allergen Reaction Noted  . Fruit & vegetable daily (nutritional supplements)  03/22/2012  . Bactrim (sulfamethoxazole w-trimethoprim) Itching, Rash, and Other (See Comments) 05/11/2012    History reviewed. No pertinent family history.  History   Social History  . Marital Status: Single      Spouse Name: N/A    Number of Children: N/A  . Years of Education: N/A   Occupational History  . STUDENT    Social History Main Topics  . Smoking status: Current Everyday Smoker -- 0.2 packs/day    Types: Cigarettes  . Smokeless tobacco: Never Used  . Alcohol Use: 1.5 oz/week    3 drink(s) per week     red wine , social   . Drug Use: No  . Sexually Active: Not Currently -- Male partner(s)   Other Topics  Concern  . Not on file   Social History Narrative  . No narrative on file    Review of Systems: All negative except as stated above in HPI.  Physical Exam: Vital signs: Filed Vitals:   05/13/12 0819  BP:   Pulse:   Temp: 99.8 F (37.7 C)  Resp:    Last BM Date: 05/13/12 General:   Lethargic, well-nourished, no acute distress Lungs:  Coarse breath sounds Heart:  Regular rate and rhythm; no murmurs, clicks, rubs,  or gallops. Abdomen: soft, NT, ND, +BS  Rectal:  Deferred  GI:  Lab Results:  Basename 05/13/12 1000 05/12/12 0523 05/11/12 1159  WBC 14.8* 8.9 9.8  HGB 12.0* 11.8* 13.2  HCT 35.1* 35.4* 38.6*  PLT 83* 100* 98*   BMET  Basename 05/13/12 1000 05/12/12 0523 05/11/12 1159  NA 134* 133* 134*  K 3.1* 3.4* 3.6  CL 102 102 100  CO2 14* 20 20  GLUCOSE 104* 123* 104*  BUN 23 12 12  CREATININE 3.25* 1.14 0.91  CALCIUM 8.2* 8.8 9.5   LFT  Basename 05/12/12 0523  PROT 7.1  ALBUMIN 3.2*  AST 77*  ALT 120*  ALKPHOS 256*  BILITOT 1.7*  BILIDIR --  IBILI --   PT/INR No results found for this basename: LABPROT:2,INR:2 in the last 72 hours   Studies/Results: Dg Chest 2 View  05/12/2012  *RADIOLOGY REPORT*  Clinical Data: Fever  CHEST - 2 VIEW  Comparison: 05/11/2012  Findings: Normal cardiac silhouette and mediastinal contours.  Lung volumes are slightly reduced with minimal increase in bilateral infrahilar heterogeneous opacities.  No focal airspace opacity.  No definite pleural effusion or pneumothorax.  Unchanged bones.  IMPRESSION: Reduced lung volumes with worsening perihilar opacities favored to represent atelectasis.  No focal airspace opacities to suggest pneumonia.  Original Report Authenticated By: JOHN A. WATTS V, M.D.   Dg Chest 2 View  05/11/2012  *RADIOLOGY REPORT*  Clinical Data: Fever.  Nausea vomiting.  CHEST - 2 VIEW  Comparison: None.  Findings: Cardiomediastinal silhouette unremarkable.  Lungs clear. Bronchovascular markings normal.   Pulmonary vascularity normal.  No pleural effusions.  No pneumothorax.  Visualized bony thorax intact.  IMPRESSION: Normal examination.  Original Report Authenticated By: THOMAS E. LAWRENCE, M.D.   Ct Head Wo Contrast  05/12/2012  *RADIOLOGY REPORT*  Clinical Data: Headache.  Diarrhea.  Fever.  HIV.  CT HEAD WITHOUT CONTRAST  Technique:  Contiguous axial images were obtained from the base of the skull through the vertex without contrast.  Comparison: None.  Findings: The brain stem, cerebellum, cerebral peduncles, thalami, basal ganglia, basilar cisterns, and ventricular system appear unremarkable.  No intracranial hemorrhage, mass lesion, or acute infarction is identified.  Chronic bilateral maxillary and ethmoid sinusitis noted.  IMPRESSION:  1.  Chronic bilateral maxillary ethmoid sinusitis.   Otherwise, no significant abnormality identified.  Original Report Authenticated By: WALTER D. LIEBKEMANN, M.D.   Us Abdomen Complete  05/11/2012  *RADIOLOGY REPORT*    Clinical Data:  Nausea/vomiting, abdominal pain  COMPLETE ABDOMINAL ULTRASOUND  Comparison:  None.  Findings:  Gallbladder:  Contracted gallbladder, likely related to nonfasting state.  No gallstones or pericholecystic fluid.  Common bile duct:  Measures 5 mm.  Liver:  No focal lesion identified.  Within normal limits in parenchymal echogenicity.  IVC:  Appears normal.  Pancreas:  Poorly visualized but grossly unremarkable.  Spleen:  Measures 6.4 cm.  Right Kidney:  Measures 1.4 cm.  No mass or hydronephrosis.  Left Kidney:  Measures 11.3 cm.  No mass or hydronephrosis.  Abdominal aorta:  Poorly visualized.  No aneurysm identified.  IMPRESSION: Contracted gallbladder.  No findings to suggest acute cholecystitis.  Otherwise negative abdominal ultrasound.  Original Report Authenticated By: SRIYESH KRISHNAN, M.D.   Ct Abdomen Pelvis W Contrast  05/12/2012  *RADIOLOGY REPORT*  Clinical Data: Fever.  Diarrhea.  Headache.  HIV.  Hepatitis B.  CT ABDOMEN AND  PELVIS WITH CONTRAST  Technique:  Multidetector CT imaging of the abdomen and pelvis was performed following the standard protocol during bolus administration of intravenous contrast.  Contrast: 100mL OMNIPAQUE IOHEXOL 300 MG/ML  SOLN  Comparison: 05/11/2012  Findings: Linear subsegmental atelectasis present in both lower lobes.  Contracted gallbladder noted.  The liver, spleen, adrenal glands, and pancreas appear unremarkable.  The kidneys appear unremarkable, as do the proximal ureters.  Small porta hepatis, peripancreatic, and retroperitoneal lymph nodes are not pathologically enlarged by size criteria.  Air-fluid levels are present throughout the distal colon, characteristic for diarrheal process.  Fluid filled loops of small bowel are not dilated.  Scattered small mesenteric lymph nodes are not pathologically enlarged but are increased in number and may be reactive.  No significant colon wall thickening is observed.  No pelvic ascites.  The appendix appears normal.  IMPRESSION:  1.  Air fluid levels in nondilated small bowel and throughout the colon including the distal colon, suggesting diarrheal process and possible enteritis.  No significant abnormal bowel wall thickening is observed.  Original Report Authenticated By: WALTER D. LIEBKEMANN, M.D.   Dg Chest Port 1 View  05/13/2012  *RADIOLOGY REPORT*  Clinical Data: HIV.  Tachypnea.  Febrile.  PORTABLE CHEST - 1 VIEW  Comparison: 05/12/2012.  Findings: No interval change compared to prior.  Mild perihilar opacity is present, likely representing atelectasis.  No pneumothorax.  No focal airspace disease is identified. Monitoring leads are projected over the chest.  Cardiopericardial silhouette within normal limits.  Stable prominence of the right hilum.  IMPRESSION: No interval change.  Low volume chest.  Original Report Authenticated By: GEOFFREY LAMKE, M.D.    Impression/Plan: 30yo with HIV AIDS with fever and odynophagia that is concerning for a viral  esophagitis of CMV vs. HSV. Will do EGD today with biopsies to further evaluate. Continue broad spectrum antibiotics. Pastor and girlfriend in room and patient states that ok to discuss results with them.    LOS: 2 days   Freddy Kinne C.  05/13/2012, 12:15 PM    

## 2012-05-13 NOTE — Progress Notes (Signed)
05/13/2012 1733 Pt returned from endo, vss.Patient very sleepy, bed alarm set. Call bell with in reach. Will continue to monitor. Celesta Gentile

## 2012-05-13 NOTE — Progress Notes (Signed)
[facility]    Subjective: More febrile, tachycardia, soft blood pressure   Antibiotics:  Anti-infectives     Start     Dose/Rate Route Frequency Ordered Stop   05/17/12 1000   azithromycin (ZITHROMAX) tablet 1,200 mg  Status:  Discontinued        1,200 mg Oral Every 7 days 05/11/12 2059 05/12/12 2247   05/13/12 1000   azithromycin (ZITHROMAX) tablet 600 mg        600 mg Oral Daily 05/13/12 0857     05/13/12 0800   fluconazole (DIFLUCAN) IVPB 200 mg        200 mg 100 mL/hr over 60 Minutes Intravenous Every 24 hours 05/12/12 1758     05/12/12 2359   ethambutol (MYAMBUTOL) tablet 15 mg/kg        15 mg/kg  89.9 kg Oral Daily 05/12/12 2247     05/12/12 2359   azithromycin (ZITHROMAX) tablet 500 mg  Status:  Discontinued        500 mg Oral Daily 05/12/12 2325 05/13/12 0857   05/12/12 2300   azithromycin (ZITHROMAX) tablet 250 mg  Status:  Discontinued        250 mg Oral Daily 05/12/12 2247 05/12/12 2324   05/12/12 2000   metroNIDAZOLE (FLAGYL) IVPB 500 mg        500 mg 100 mL/hr over 60 Minutes Intravenous Every 8 hours 05/12/12 1821     05/12/12 1830   fluconazole (DIFLUCAN) IVPB 400 mg  Status:  Discontinued        400 mg 200 mL/hr over 60 Minutes Intravenous Every 24 hours 05/12/12 1744 05/12/12 1758   05/12/12 1830   cefTRIAXone (ROCEPHIN) 2 g in dextrose 5 % 50 mL IVPB        2 g 100 mL/hr over 30 Minutes Intravenous Every 24 hours 05/12/12 1821     05/11/12 2200   fluconazole (DIFLUCAN) tablet 200 mg  Status:  Discontinued        200 mg Oral Daily 05/11/12 2059 05/12/12 1743   05/11/12 2200   dapsone tablet 100 mg        100 mg Oral Daily 05/11/12 2059     05/11/12 2200   vancomycin (VANCOCIN) IVPB 1000 mg/200 mL premix        1,000 mg 200 mL/hr over 60 Minutes Intravenous Every 8 hours 05/11/12 2134     05/11/12 2200   piperacillin-tazobactam (ZOSYN) IVPB 3.375 g  Status:  Discontinued        3.375 g 12.5 mL/hr over 240 Minutes  Intravenous 3 times per day 05/11/12 2134 05/12/12 1821          Medications: Scheduled Meds:   . azithromycin  600 mg Oral Daily  . cefTRIAXone (ROCEPHIN)  IV  2 g Intravenous Q24H  . dapsone  100 mg Oral Daily  . enoxaparin (LOVENOX) injection  40 mg Subcutaneous Q24H  . ethambutol  15 mg/kg Oral Daily  . fluconazole (DIFLUCAN) IV  200 mg Intravenous Q24H  . metronidazole  500 mg Intravenous Q8H  . senna  1 tablet Oral BID  . sodium chloride  1,000 mL Intravenous Once  . vancomycin  1,000 mg Intravenous Q8H  . DISCONTD: azithromycin  1,200 mg Oral Q7 days  . DISCONTD: azithromycin  250 mg Oral Daily  . DISCONTD: azithromycin  500 mg Oral Daily  . DISCONTD: fluconazole (DIFLUCAN) IV  400 mg Intravenous Q24H  . DISCONTD: fluconazole  200  mg Oral Daily  . DISCONTD: piperacillin-tazobactam (ZOSYN)  IV  3.375 g Intravenous Q8H   Continuous Infusions:   . sodium chloride 1,000 mL (05/13/12 0331)  . DISCONTD: sodium chloride 1,000 mL (05/11/12 1734)  . DISCONTD: sodium chloride 1,000 mL (05/12/12 1501)   PRN Meds:.acetaminophen, acetaminophen, ibuprofen, iohexol, ondansetron (ZOFRAN) IV, ondansetron, promethazine, DISCONTD: acetaminophen, DISCONTD: acetaminophen, DISCONTD: acetaminophen, DISCONTD: acetaminophen   Objective: Weight change:   Intake/Output Summary (Last 24 hours) at 05/13/12 1122 Last data filed at 05/13/12 0700  Gross per 24 hour  Intake   4052 ml  Output      0 ml  Net   4052 ml   Blood pressure 92/58, pulse 130, temperature 99.8 F (37.7 C), temperature source Oral, resp. rate 38, height 5\' 6"  (1.676 m), weight 198 lb 3.2 oz (89.903 kg), SpO2 94.00%. Temp:  [99.8 F (37.7 C)-103.1 F (39.5 C)] 99.8 F (37.7 C) (08/12 0819) Pulse Rate:  [125-139] 130  (08/12 0815) Resp:  [31-42] 38  (08/12 0815) BP: (92-124)/(49-70) 92/58 mmHg (08/12 0815) SpO2:  [94 %-100 %] 94 % (08/12 0815)  Physical Exam: General: Alert and awake, oriented x3 HEENT:, injected  sclera pupils reactive to light and accommodation, posterior oropharynx erythematous without exudate, tongue with thrush CVS tachycardic , no murmur rubs or gallops Chest: clear to auscultation bilaterally, no wheezing, rales or rhonchi Abdomen: soft diffusely tender , normal bowel sounds, Extremities: no  clubbing or edema noted bilaterally Skin: no rashes Lymph: no new lymphadenopathy Neuro: nonfocal  Lab Results:  Basename 05/13/12 1000 05/12/12 0523  WBC 14.8* 8.9  HGB 12.0* 11.8*  HCT 35.1* 35.4*  PLT 83* 100*    BMET  Basename 05/13/12 1000 05/12/12 0523  NA 134* 133*  K 3.1* 3.4*  CL 102 102  CO2 14* 20  GLUCOSE 104* 123*  BUN 23 12  CREATININE 3.25* 1.14  CALCIUM 8.2* 8.8    Micro Results: Recent Results (from the past 240 hour(s))  URINE CULTURE     Status: Normal   Collection Time   05/11/12 12:18 PM      Component Value Range Status Comment   Specimen Description URINE, CATHETERIZED   Final    Special Requests NONE   Final    Culture  Setup Time 05/11/2012 22:56   Final    Colony Count NO GROWTH   Final    Culture NO GROWTH   Final    Report Status 05/12/2012 FINAL   Final   CULTURE, BLOOD (ROUTINE X 2)     Status: Normal (Preliminary result)   Collection Time   05/11/12  4:10 PM      Component Value Range Status Comment   Specimen Description BLOOD LEFT ARM   Final    Special Requests BOTTLES DRAWN AEROBIC AND ANAEROBIC 10CC   Final    Culture  Setup Time 05/11/2012 22:55   Final    Culture     Final    Value:        BLOOD CULTURE RECEIVED NO GROWTH TO DATE CULTURE WILL BE HELD FOR 5 DAYS BEFORE ISSUING A FINAL NEGATIVE REPORT   Report Status PENDING   Incomplete   CULTURE, BLOOD (ROUTINE X 2)     Status: Normal (Preliminary result)   Collection Time   05/11/12  4:25 PM      Component Value Range Status Comment   Specimen Description BLOOD LEFT HAND   Final    Special Requests BOTTLES DRAWN AEROBIC AND ANAEROBIC  10CC   Final    Culture  Setup Time  05/11/2012 22:55   Final    Culture     Final    Value:        BLOOD CULTURE RECEIVED NO GROWTH TO DATE CULTURE WILL BE HELD FOR 5 DAYS BEFORE ISSUING A FINAL NEGATIVE REPORT   Report Status PENDING   Incomplete   AFB CULTURE, BLOOD     Status: Normal (Preliminary result)   Collection Time   05/12/12 12:33 PM      Component Value Range Status Comment   Specimen Description BLOOD LEFT ARM   Final    Special Requests BOTTLES DRAWN AEROBIC ONLY 3CC   Final    Culture     Final    Value: CULTURE WILL BE EXAMINED FOR 6 WEEKS BEFORE ISSUING A FINAL REPORT   Report Status PENDING   Incomplete   MRSA PCR SCREENING     Status: Normal   Collection Time   05/12/12  3:35 PM      Component Value Range Status Comment   MRSA by PCR NEGATIVE  NEGATIVE Final   CULTURE, EXPECTORATED SPUTUM-ASSESSMENT     Status: Normal   Collection Time   05/12/12  5:04 PM      Component Value Range Status Comment   Specimen Description SPUTUM   Final    Special Requests NONE   Final    Sputum evaluation     Final    Value: MICROSCOPIC FINDINGS SUGGEST THAT THIS SPECIMEN IS NOT REPRESENTATIVE OF LOWER RESPIRATORY SECRETIONS. PLEASE RECOLLECT.     CALLED TO MATTHEWS,M RN 05/12/12 1745 WOOTEN,K   Report Status 05/12/2012 FINAL   Final   STOOL CULTURE     Status: Normal (Preliminary result)   Collection Time   05/12/12  7:01 PM      Component Value Range Status Comment   Specimen Description STOOL   Final    Special Requests Immunocompromised   Final    Culture Culture reincubated for better growth   Final    Report Status PENDING   Incomplete     Studies/Results: Dg Chest 2 View  05/12/2012  *RADIOLOGY REPORT*  Clinical Data: Fever  CHEST - 2 VIEW  Comparison: 05/11/2012  Findings: Normal cardiac silhouette and mediastinal contours.  Lung volumes are slightly reduced with minimal increase in bilateral infrahilar heterogeneous opacities.  No focal airspace opacity.  No definite pleural effusion or pneumothorax.   Unchanged bones.  IMPRESSION: Reduced lung volumes with worsening perihilar opacities favored to represent atelectasis.  No focal airspace opacities to suggest pneumonia.  Original Report Authenticated By: Waynard Reeds, M.D.   Dg Chest 2 View  05/11/2012  *RADIOLOGY REPORT*  Clinical Data: Fever.  Nausea vomiting.  CHEST - 2 VIEW  Comparison: None.  Findings: Cardiomediastinal silhouette unremarkable.  Lungs clear. Bronchovascular markings normal.  Pulmonary vascularity normal.  No pleural effusions.  No pneumothorax.  Visualized bony thorax intact.  IMPRESSION: Normal examination.  Original Report Authenticated By: Arnell Sieving, M.D.   Ct Head Wo Contrast  05/12/2012  *RADIOLOGY REPORT*  Clinical Data: Headache.  Diarrhea.  Fever.  HIV.  CT HEAD WITHOUT CONTRAST  Technique:  Contiguous axial images were obtained from the base of the skull through the vertex without contrast.  Comparison: None.  Findings: The brain stem, cerebellum, cerebral peduncles, thalami, basal ganglia, basilar cisterns, and ventricular system appear unremarkable.  No intracranial hemorrhage, mass lesion, or acute infarction is identified.  Chronic  bilateral maxillary and ethmoid sinusitis noted.  IMPRESSION:  1.  Chronic bilateral maxillary ethmoid sinusitis.   Otherwise, no significant abnormality identified.  Original Report Authenticated By: Dellia Cloud, M.D.   US Abdomen Complete  05/11/2012  *RADIOLOGY REPORT*  Clinical Data:  Nausea/vomiting, abdominal pain  COMPLETE ABDOMINAL ULTRASOUND  Comparison:  None.  Findings:  Gallbladder:  Contracted gallbladder, likely related to nonfasting state.  No gallstones or pericholecystic fluid.  Common bile duct:  Measures 5 mm.  Liver:  No focal lesion identified.  Within normal limits in parenchymal echogenicity.  IVC:  Appears normal.  Pancreas:  Poorly visualized but grossly unremarkable.  Spleen:  Measures 6.4 cm.  Right Kidney:  Measures 1.4 cm.  No mass or  hydronephrosis.  Left Kidney:  Measures 11.3 cm.  No mass or hydronephrosis.  Abdominal aorta:  Poorly visualized.  No aneurysm identified.  IMPRESSION: Contracted gallbladder.  No findings to suggest acute cholecystitis.  Otherwise negative abdominal ultrasound.  Original Report Authenticated By: Charline Bills, M.D.   Ct Abdomen Pelvis W Contrast  05/12/2012  *RADIOLOGY REPORT*  Clinical Data: Fever.  Diarrhea.  Headache.  HIV.  Hepatitis B.  CT ABDOMEN AND PELVIS WITH CONTRAST  Technique:  Multidetector CT imaging of the abdomen and pelvis was performed following the standard protocol during bolus administration of intravenous contrast.  Contrast: OMNIPAQUE IOHEXOL 300 MG/ML  SOLN  Comparison: 05/11/2012  Findings: Linear subsegmental atelectasis present in both lower lobes.  Contracted gallbladder noted.  The liver, spleen, adrenal glands, and pancreas appear unremarkable.  The kidneys appear unremarkable, as do the proximal ureters.  Small porta hepatis, peripancreatic, and retroperitoneal lymph nodes are not pathologically enlarged by size criteria.  Air-fluid levels are present throughout the distal colon, characteristic for diarrheal process.  Fluid filled loops of small bowel are not dilated.  Scattered small mesenteric lymph nodes are not pathologically enlarged but are increased in number and may be reactive.  No significant colon wall thickening is observed.  No pelvic ascites.  The appendix appears normal.  IMPRESSION:  1.  Air fluid levels in nondilated small bowel and throughout the colon including the distal colon, suggesting diarrheal process and possible enteritis.  No significant abnormal bowel wall thickening is observed.  Original Report Authenticated By: Dellia Cloud, M.D.   Dg Chest Port 1 View  05/13/2012  *RADIOLOGY REPORT*  Clinical Data: HIV.  Tachypnea.  Febrile.  PORTABLE CHEST - 1 VIEW  Comparison: 05/12/2012.  Findings: No interval change compared to prior.  Mild  perihilar opacity is present, likely representing atelectasis.  No pneumothorax.  No focal airspace disease is identified. Monitoring leads are projected over the chest.  Cardiopericardial silhouette within normal limits.  Stable prominence of the right hilum.  IMPRESSION: No interval change.  Low volume chest.  Original Report Authenticated By: Andreas Newport, M.D.      Assessment/Plan: Zachary Hill is a 31 y.o. male with  HIV/AIDS CD 4 count < 10, VL [phone] presents with fever, mostly GI symptoms also with  \dry cough, tension headache and diarrhea. CT with findings c/w enteritis. Serum crypto ag negative. Overnight more tachycardic, febrile and anti-MAvium therapy started. Antibacterials changed to rocephin and flagyl. He is also on fluconazole for thrush  1) Fevers: He is very broadly covered at present. -- I discussed with team idea of asking GI for upper GI and/or lower GI to look for CMV, HSV, candidal infection in esophagus vs CMV, HSV, OI, malignancy in lower GI tract. --  If GI performs endocscopy I would make sure that specimens ARE SENT IN VIRAL TRANSPORT MEDIA FOR CMV BY PCR AND HSV BY PCR, for fungal cultures and also sent separately in formalin for path exam --agree with continuing anti-Mavium therapy --agree with continuing flagyl and rocephin as well for now  --agree with fluconazole --make sure stool sent for giardia, cryptosporidium/microsporidium  if we do not find a definite pathogen and pt fails to improve on broad spectrum abx will need to think about other diagnostic maneuvers such as MRI head and LP w opening pressures, stat crypto ag, CSF protein, glucose, CSF culture, CSF CMV PCR, Toxoplasma PCR and if MRI suggestive PML JC virus PCR or primary CNS lymphoma then also  EBV PCR  2) HIV/AIDS:  --Dr Drue Second is working with staff in [facility] and one of folks from [facility] or [facility] will help get Pharmacy assistance rolling for norvir. We have samples (fortunately for truvada and  prezista).  --Patient will need urgent ADAP application which we will move in [facility], but he also needs emergency medicaid application --I will likely start him on prezista, norvir and truvada tomorrow am --he needs continued PCP prophylaxis --note per communication with Dr. Drue Second PATIENT NEEDS  UTMOST DISCRETION RE HIS [phone] STATUS. ONLY HIS PASTOR KNOWS THIS SO PLEASE BE EXTREMETLY CAREFUL and mindful of any visitors so as to not divulge his status to others without his permission  I spent greater than 45 minutes with the patient including greater than 50% of time in face to face counsel of the patient and in coordination of their care.        LOS: 2 days   Acey Lav 05/13/2012, 11:22 AM

## 2012-05-13 NOTE — Progress Notes (Addendum)
INTERVAL PROGRESS NOTE:   Zachary Hill to see patient ~2200 after being called regarding persistent hyperthermia. Patient was somnolent on exam, but was tachypneic, tachycardic to ~140bpm, febrile (~103F). Due to fevers being persistent, patient was started on ethambutol, and azithromycin was transitioned from ppx doses to therapeutic doses (per ID recommendations for persistent fever).   Patient will also get CXR due to tachypnea that persists despite subsequent defervescence following aforementioned encounter/treatment modification.

## 2012-05-13 NOTE — Progress Notes (Signed)
Patient ID: Zachary Hill, male   DOB: [birth year], 31 y.o.   MRN: [id number] Referring Provider: Dr. Aundria Rud Primary Care Physician:  Judyann Munson, MD Primary Gastroenterologist:  Gentry Fitz  Reason for Consultation:  Odynophagia; Nausea and vomiting  HPI: Zachary Hill is a 31 y.o. male with HIV AIDS who has been having fever and odynophagia for the past 5-7 days. He states that he has not been able to eat or drink without pain since last Wednesday. Pain in throat even without swallowing and recurrent bilious vomiting. CD4 10. Fevers 103. He stopped his antiretrovirals at some point prior to admit and saw Dr. Drue Second at the end of July with painful swallowing and cold-like symptoms. No improvement with outpt treatment and came in to the ER on August 10th for further eval. In addition to N/V/F/C he has been SOB with a cough. No previous history of HSV/CMV esophagitis. Currently on Azithromycin, Flagyl, Diflucan, Dapsone, Ceftriaxone, and Vancomycin.    Past Medical History  Diagnosis Date  . HIV disease   . Hepatitis B     History reviewed. No pertinent past surgical history.  Prior to Admission medications   Medication Sig Start Date End Date Taking? Authorizing Provider  azithromycin (ZITHROMAX) 600 MG tablet Take 2 tablets (1,200 mg total) by mouth every 7 (seven) days. 05/10/12 06/09/12 Yes Judyann Munson, MD  dapsone 100 MG tablet Take 1 tablet (100 mg total) by mouth daily. 05/10/12  Yes Judyann Munson, MD  darunavir (PREZISTA) 600 MG tablet Take 1 tablet (600 mg total) by mouth 2 (two) times daily with a meal. 05/10/12 05/10/13 Yes Judyann Munson, MD  emtricitabine-tenofovir (TRUVADA) 200-300 MG per tablet Take 1 tablet by mouth daily. 05/10/12 05/10/13 Yes Judyann Munson, MD  fluconazole (DIFLUCAN) 200 MG tablet Take 200 mg by mouth daily.   Yes Historical Provider, MD  ritonavir (NORVIR) 100 MG TABS Take 1 tablet (100 mg total) by mouth daily. 05/10/12  Yes Judyann Munson, MD    Scheduled  Meds:   . azithromycin  600 mg Oral Daily  . cefTRIAXone (ROCEPHIN)  IV  2 g Intravenous Q24H  . dapsone  100 mg Oral Daily  . enoxaparin (LOVENOX) injection  40 mg Subcutaneous Q24H  . ethambutol  15 mg/kg Oral Daily  . fluconazole (DIFLUCAN) IV  200 mg Intravenous Q24H  . metronidazole  500 mg Intravenous Q8H  . senna  1 tablet Oral BID  . sodium chloride  1,000 mL Intravenous Once  . vancomycin  1,000 mg Intravenous Q8H  . DISCONTD: azithromycin  1,200 mg Oral Q7 days  . DISCONTD: azithromycin  250 mg Oral Daily  . DISCONTD: azithromycin  500 mg Oral Daily  . DISCONTD: fluconazole (DIFLUCAN) IV  400 mg Intravenous Q24H  . DISCONTD: fluconazole  200 mg Oral Daily  . DISCONTD: piperacillin-tazobactam (ZOSYN)  IV  3.375 g Intravenous Q8H   Continuous Infusions:   . sodium chloride 1,000 mL (05/13/12 0331)  . DISCONTD: sodium chloride 1,000 mL (05/11/12 1734)  . DISCONTD: sodium chloride 1,000 mL (05/12/12 1501)   PRN Meds:.acetaminophen, acetaminophen, ibuprofen, iohexol, ondansetron (ZOFRAN) IV, ondansetron, promethazine, DISCONTD: acetaminophen, DISCONTD: acetaminophen, DISCONTD: acetaminophen, DISCONTD: acetaminophen  Allergies as of 05/11/2012 - Review Complete 05/11/2012  Allergen Reaction Noted  . Fruit & vegetable daily (nutritional supplements)  03/22/2012  . Bactrim (sulfamethoxazole w-trimethoprim) Itching, Rash, and Other (See Comments) 05/11/2012    History reviewed. No pertinent family history.  History   Social History  . Marital Status: Single  Spouse Name: N/A    Number of Children: N/A  . Years of Education: N/A   Occupational History  . STUDENT    Social History Main Topics  . Smoking status: Current Everyday Smoker -- 0.2 packs/day    Types: Cigarettes  . Smokeless tobacco: Never Used  . Alcohol Use: 1.5 oz/week    3 drink(s) per week     red wine , social   . Drug Use: No  . Sexually Active: Not Currently -- Male partner(s)   Other Topics  Concern  . Not on file   Social History Narrative  . No narrative on file    Review of Systems: All negative except as stated above in HPI.  Physical Exam: Vital signs: Filed Vitals:   05/13/12 0819  BP:   Pulse:   Temp: 99.8 F (37.7 C)  Resp:    Last BM Date: 05/13/12 General:   Lethargic, well-nourished, no acute distress Lungs:  Coarse breath sounds Heart:  Regular rate and rhythm; no murmurs, clicks, rubs,  or gallops. Abdomen: soft, NT, ND, +BS  Rectal:  Deferred  GI:  Lab Results:  Basename 05/13/12 1000 05/12/12 0523 05/11/12 1159  WBC 14.8* 8.9 9.8  HGB 12.0* 11.8* 13.2  HCT 35.1* 35.4* 38.6*  PLT 83* 100* 98*   BMET  Basename 05/13/12 1000 05/12/12 0523 05/11/12 1159  NA 134* 133* 134*  K 3.1* 3.4* 3.6  CL 102 102 100  CO2 14* 20 20  GLUCOSE 104* 123* 104*  BUN 23 12 12   CREATININE 3.25* 1.14 0.91  CALCIUM 8.2* 8.8 9.5   LFT  Basename 05/12/12 0523  PROT 7.1  ALBUMIN 3.2*  AST 77*  ALT 120*  ALKPHOS 256*  BILITOT 1.7*  BILIDIR --  IBILI --   PT/INR No results found for this basename: LABPROT:2,INR:2 in the last 72 hours   Studies/Results: Dg Chest 2 View  05/12/2012  *RADIOLOGY REPORT*  Clinical Data: Fever  CHEST - 2 VIEW  Comparison: 05/11/2012  Findings: Normal cardiac silhouette and mediastinal contours.  Lung volumes are slightly reduced with minimal increase in bilateral infrahilar heterogeneous opacities.  No focal airspace opacity.  No definite pleural effusion or pneumothorax.  Unchanged bones.  IMPRESSION: Reduced lung volumes with worsening perihilar opacities favored to represent atelectasis.  No focal airspace opacities to suggest pneumonia.  Original Report Authenticated By: Waynard Reeds, M.D.   Dg Chest 2 View  05/11/2012  *RADIOLOGY REPORT*  Clinical Data: Fever.  Nausea vomiting.  CHEST - 2 VIEW  Comparison: None.  Findings: Cardiomediastinal silhouette unremarkable.  Lungs clear. Bronchovascular markings normal.   Pulmonary vascularity normal.  No pleural effusions.  No pneumothorax.  Visualized bony thorax intact.  IMPRESSION: Normal examination.  Original Report Authenticated By: Arnell Sieving, M.D.   Ct Head Wo Contrast  05/12/2012  *RADIOLOGY REPORT*  Clinical Data: Headache.  Diarrhea.  Fever.  HIV.  CT HEAD WITHOUT CONTRAST  Technique:  Contiguous axial images were obtained from the base of the skull through the vertex without contrast.  Comparison: None.  Findings: The brain stem, cerebellum, cerebral peduncles, thalami, basal ganglia, basilar cisterns, and ventricular system appear unremarkable.  No intracranial hemorrhage, mass lesion, or acute infarction is identified.  Chronic bilateral maxillary and ethmoid sinusitis noted.  IMPRESSION:  1.  Chronic bilateral maxillary ethmoid sinusitis.   Otherwise, no significant abnormality identified.  Original Report Authenticated By: Dellia Cloud, M.D.   US Abdomen Complete  05/11/2012  *RADIOLOGY REPORT*  Clinical Data:  Nausea/vomiting, abdominal pain  COMPLETE ABDOMINAL ULTRASOUND  Comparison:  None.  Findings:  Gallbladder:  Contracted gallbladder, likely related to nonfasting state.  No gallstones or pericholecystic fluid.  Common bile duct:  Measures 5 mm.  Liver:  No focal lesion identified.  Within normal limits in parenchymal echogenicity.  IVC:  Appears normal.  Pancreas:  Poorly visualized but grossly unremarkable.  Spleen:  Measures 6.4 cm.  Right Kidney:  Measures 1.4 cm.  No mass or hydronephrosis.  Left Kidney:  Measures 11.3 cm.  No mass or hydronephrosis.  Abdominal aorta:  Poorly visualized.  No aneurysm identified.  IMPRESSION: Contracted gallbladder.  No findings to suggest acute cholecystitis.  Otherwise negative abdominal ultrasound.  Original Report Authenticated By: Charline Bills, M.D.   Ct Abdomen Pelvis W Contrast  05/12/2012  *RADIOLOGY REPORT*  Clinical Data: Fever.  Diarrhea.  Headache.  HIV.  Hepatitis B.  CT ABDOMEN AND  PELVIS WITH CONTRAST  Technique:  Multidetector CT imaging of the abdomen and pelvis was performed following the standard protocol during bolus administration of intravenous contrast.  Contrast: OMNIPAQUE IOHEXOL 300 MG/ML  SOLN  Comparison: 05/11/2012  Findings: Linear subsegmental atelectasis present in both lower lobes.  Contracted gallbladder noted.  The liver, spleen, adrenal glands, and pancreas appear unremarkable.  The kidneys appear unremarkable, as do the proximal ureters.  Small porta hepatis, peripancreatic, and retroperitoneal lymph nodes are not pathologically enlarged by size criteria.  Air-fluid levels are present throughout the distal colon, characteristic for diarrheal process.  Fluid filled loops of small bowel are not dilated.  Scattered small mesenteric lymph nodes are not pathologically enlarged but are increased in number and may be reactive.  No significant colon wall thickening is observed.  No pelvic ascites.  The appendix appears normal.  IMPRESSION:  1.  Air fluid levels in nondilated small bowel and throughout the colon including the distal colon, suggesting diarrheal process and possible enteritis.  No significant abnormal bowel wall thickening is observed.  Original Report Authenticated By: Dellia Cloud, M.D.   Dg Chest Port 1 View  05/13/2012  *RADIOLOGY REPORT*  Clinical Data: HIV.  Tachypnea.  Febrile.  PORTABLE CHEST - 1 VIEW  Comparison: 05/12/2012.  Findings: No interval change compared to prior.  Mild perihilar opacity is present, likely representing atelectasis.  No pneumothorax.  No focal airspace disease is identified. Monitoring leads are projected over the chest.  Cardiopericardial silhouette within normal limits.  Stable prominence of the right hilum.  IMPRESSION: No interval change.  Low volume chest.  Original Report Authenticated By: Andreas Newport, M.D.    Impression/Plan: 30yo with HIV AIDS with fever and odynophagia that is concerning for a viral  esophagitis of CMV vs. HSV. Will do EGD today with biopsies to further evaluate. Continue broad spectrum antibiotics. Renato Gails and girlfriend in room and patient states that ok to discuss results with them.    LOS: 2 days   Obert Espindola C.  05/13/2012, 12:15 PM

## 2012-05-13 NOTE — Interval H&P Note (Signed)
History and Physical Interval Note:  05/13/2012 4:06 PM  Zachary Hill  has presented today for surgery, with the diagnosis of nausea vomiting  The various methods of treatment have been discussed with the patient and family. After consideration of risks, benefits and other options for treatment, the patient has consented to  Procedure(s) (LRB): ESOPHAGOGASTRODUODENOSCOPY (EGD) (N/A) as a surgical intervention .  The patient's history has been reviewed, patient examined, no change in status, stable for surgery.  I have reviewed the patient's chart and labs.  Questions were answered to the patient's satisfaction.     [name]

## 2012-05-13 NOTE — Progress Notes (Signed)
Subjective: He was febrile through out the night with temp in 102F despite Tylenol. Temp down to 60F this AM. He continues to be tachycardic in the 130s.   He had a 1L NS bolus last night at 2200 and again this AM for BP in the 90/60s.  He still has a sore throat with pain with swallowing and a frontal headache. He had one episode of diarrhea this AM and one bilious emesis last night.   He denies confusion, neck pain or stiffness, chest pain, or shortness of breath.   Objective: Vital signs in last 24 hours: Filed Vitals:   05/13/12 0620 05/13/12 0700 05/13/12 0815 05/13/12 0819  BP:   92/58   Pulse: 134  130   Temp:  102.2 F (39 C) 99.8 F (37.7 C) 99.8 F (37.7 C)  TempSrc:   Oral   Resp: 36  38   Height:      Weight:      SpO2: 96%  94%    Weight change:   Intake/Output Summary (Last 24 hours) at 05/13/12 1031 Last data filed at 05/13/12 0700  Gross per 24 hour  Intake   4052 ml  Output      0 ml  Net   4052 ml   Vitals reviewed. General: resting in bed, tired appearing, in NAD HEENT: bilateral conjunctivitis with no eye discharge, petechial pharyngitis with no exudates, mild thrush plaque on his tongue. No cervical or axillary LAD.  Cardiac: tachycardic, no rubs, murmurs or gallops Pulm: clear to auscultation bilaterally, no wheezes, rales, or rhonchi Abd: soft, mid epigastric tenderness, nondistended, BS present Ext: warm and well perfused, no pedal edema, plantar or palmar rash. No rash noted on his abdomen, back, chest, UE or LE.  Neuro: alert and oriented X3, cranial nerves II-XII grossly intact, strength and sensation to light touch equal in bilateral upper and lower extremities  Lab Results: Basic Metabolic Panel:  Lab 05/12/12 1610 05/11/12 1159  NA 133* 134*  K 3.4* 3.6  CL 102 100  CO2 20 20  GLUCOSE 123* 104*  BUN 12 12  CREATININE 1.14 0.91  CALCIUM 8.8 9.5  MG -- --  PHOS -- --   Liver Function Tests:  Lab 05/12/12 0523 05/11/12 1159  AST  77* 85*  ALT 120* 138*  ALKPHOS 256* 293*  BILITOT 1.7* 1.3*  PROT 7.1 8.4*  ALBUMIN 3.2* 3.8    Lab 05/11/12 1347  LIPASE 52  AMYLASE --   CBC:  Lab 05/13/12 1000 05/12/12 0523 05/11/12 1159  WBC 14.8* 8.9 --  NEUTROABS -- -- 8.2*  HGB 12.0* 11.8* --  HCT 35.1* 35.4* --  MCV 79.1 80.1 --  PLT 83* 100* --   Fasting Lipid Panel:  Lab 05/12/12 1932  CHOL 141  HDL 13*  LDLCALC 85  TRIG 960*  CHOLHDL 10.8  LDLDIRECT --   Anemia Panel:  Lab 05/12/12 1234  VITAMINB12 --  FOLATE --  FERRITIN 1334*  TIBC --  IRON --  RETICCTPCT --   Urinalysis:  Lab 05/11/12 1218  COLORURINE ORANGE*  LABSPEC 1.029  PHURINE 6.0  GLUCOSEU NEGATIVE  HGBUR NEGATIVE  BILIRUBINUR SMALL*  KETONESUR 15*  PROTEINUR 100*  UROBILINOGEN 4.0*  NITRITE NEGATIVE  LEUKOCYTESUR TRACE*    Micro Results: Recent Results (from the past 240 hour(s))  URINE CULTURE     Status: Normal   Collection Time   05/11/12 12:18 PM      Component Value Range Status Comment  Specimen Description URINE, CATHETERIZED   Final    Special Requests NONE   Final    Culture  Setup Time 05/11/2012 22:56   Final    [place] Count NO GROWTH   Final    Culture NO GROWTH   Final    Report Status 05/12/2012 FINAL   Final   CULTURE, BLOOD (ROUTINE X 2)     Status: Normal (Preliminary result)   Collection Time   05/11/12  4:10 PM      Component Value Range Status Comment   Specimen Description BLOOD LEFT ARM   Final    Special Requests BOTTLES DRAWN AEROBIC AND ANAEROBIC 10CC   Final    Culture  Setup Time 05/11/2012 22:55   Final    Culture     Final    Value:        BLOOD CULTURE RECEIVED NO GROWTH TO DATE CULTURE WILL BE HELD FOR 5 DAYS BEFORE ISSUING A FINAL NEGATIVE REPORT   Report Status PENDING   Incomplete   CULTURE, BLOOD (ROUTINE X 2)     Status: Normal (Preliminary result)   Collection Time   05/11/12  4:25 PM      Component Value Range Status Comment   Specimen Description BLOOD LEFT HAND   Final     Special Requests BOTTLES DRAWN AEROBIC AND ANAEROBIC 10CC   Final    Culture  Setup Time 05/11/2012 22:55   Final    Culture     Final    Value:        BLOOD CULTURE RECEIVED NO GROWTH TO DATE CULTURE WILL BE HELD FOR 5 DAYS BEFORE ISSUING A FINAL NEGATIVE REPORT   Report Status PENDING   Incomplete   AFB CULTURE, BLOOD     Status: Normal (Preliminary result)   Collection Time   05/12/12 12:33 PM      Component Value Range Status Comment   Specimen Description BLOOD LEFT ARM   Final    Special Requests BOTTLES DRAWN AEROBIC ONLY 3CC   Final    Culture     Final    Value: CULTURE WILL BE EXAMINED FOR 6 WEEKS BEFORE ISSUING A FINAL REPORT   Report Status PENDING   Incomplete   MRSA PCR SCREENING     Status: Normal   Collection Time   05/12/12  3:35 PM      Component Value Range Status Comment   MRSA by PCR NEGATIVE  NEGATIVE Final   CULTURE, EXPECTORATED SPUTUM-ASSESSMENT     Status: Normal   Collection Time   05/12/12  5:04 PM      Component Value Range Status Comment   Specimen Description SPUTUM   Final    Special Requests NONE   Final    Sputum evaluation     Final    Value: MICROSCOPIC FINDINGS SUGGEST THAT THIS SPECIMEN IS NOT REPRESENTATIVE OF LOWER RESPIRATORY SECRETIONS. PLEASE RECOLLECT.     CALLED TO MATTHEWS,M RN 05/12/12 1745 WOOTEN,K   Report Status 05/12/2012 FINAL   Final    Studies/Results: Dg Chest 2 View  05/12/2012  *RADIOLOGY REPORT*  Clinical Data: Fever  CHEST - 2 VIEW  Comparison: 05/11/2012  Findings: Normal cardiac silhouette and mediastinal contours.  Lung volumes are slightly reduced with minimal increase in bilateral infrahilar heterogeneous opacities.  No focal airspace opacity.  No definite pleural effusion or pneumothorax.  Unchanged bones.  IMPRESSION: Reduced lung volumes with worsening perihilar opacities favored to represent atelectasis.  No focal airspace  opacities to suggest pneumonia.  Original Report Authenticated By: Waynard Reeds, M.D.   Dg  Chest 2 View  05/11/2012  *RADIOLOGY REPORT*  Clinical Data: Fever.  Nausea vomiting.  CHEST - 2 VIEW  Comparison: None.  Findings: Cardiomediastinal silhouette unremarkable.  Lungs clear. Bronchovascular markings normal.  Pulmonary vascularity normal.  No pleural effusions.  No pneumothorax.  Visualized bony thorax intact.  IMPRESSION: Normal examination.  Original Report Authenticated By: Arnell Sieving, M.D.   Ct Head Wo Contrast  05/12/2012  *RADIOLOGY REPORT*  Clinical Data: Headache.  Diarrhea.  Fever.  HIV.  CT HEAD WITHOUT CONTRAST  Technique:  Contiguous axial images were obtained from the base of the skull through the vertex without contrast.  Comparison: None.  Findings: The brain stem, cerebellum, cerebral peduncles, thalami, basal ganglia, basilar cisterns, and ventricular system appear unremarkable.  No intracranial hemorrhage, mass lesion, or acute infarction is identified.  Chronic bilateral maxillary and ethmoid sinusitis noted.  IMPRESSION:  1.  Chronic bilateral maxillary ethmoid sinusitis.   Otherwise, no significant abnormality identified.  Original Report Authenticated By: Dellia Cloud, M.D.   US Abdomen Complete  05/11/2012  *RADIOLOGY REPORT*  Clinical Data:  Nausea/vomiting, abdominal pain  COMPLETE ABDOMINAL ULTRASOUND  Comparison:  None.  Findings:  Gallbladder:  Contracted gallbladder, likely related to nonfasting state.  No gallstones or pericholecystic fluid.  Common bile duct:  Measures 5 mm.  Liver:  No focal lesion identified.  Within normal limits in parenchymal echogenicity.  IVC:  Appears normal.  Pancreas:  Poorly visualized but grossly unremarkable.  Spleen:  Measures 6.4 cm.  Right Kidney:  Measures 1.4 cm.  No mass or hydronephrosis.  Left Kidney:  Measures 11.3 cm.  No mass or hydronephrosis.  Abdominal aorta:  Poorly visualized.  No aneurysm identified.  IMPRESSION: Contracted gallbladder.  No findings to suggest acute cholecystitis.  Otherwise negative  abdominal ultrasound.  Original Report Authenticated By: Charline Bills, M.D.   Ct Abdomen Pelvis W Contrast  05/12/2012  *RADIOLOGY REPORT*  Clinical Data: Fever.  Diarrhea.  Headache.  HIV.  Hepatitis B.  CT ABDOMEN AND PELVIS WITH CONTRAST  Technique:  Multidetector CT imaging of the abdomen and pelvis was performed following the standard protocol during bolus administration of intravenous contrast.  Contrast: OMNIPAQUE IOHEXOL 300 MG/ML  SOLN  Comparison: 05/11/2012  Findings: Linear subsegmental atelectasis present in both lower lobes.  Contracted gallbladder noted.  The liver, spleen, adrenal glands, and pancreas appear unremarkable.  The kidneys appear unremarkable, as do the proximal ureters.  Small porta hepatis, peripancreatic, and retroperitoneal lymph nodes are not pathologically enlarged by size criteria.  Air-fluid levels are present throughout the distal colon, characteristic for diarrheal process.  Fluid filled loops of small bowel are not dilated.  Scattered small mesenteric lymph nodes are not pathologically enlarged but are increased in number and may be reactive.  No significant colon wall thickening is observed.  No pelvic ascites.  The appendix appears normal.  IMPRESSION:  1.  Air fluid levels in nondilated small bowel and throughout the colon including the distal colon, suggesting diarrheal process and possible enteritis.  No significant abnormal bowel wall thickening is observed.  Original Report Authenticated By: Dellia Cloud, M.D.   Dg Chest Port 1 View  05/13/2012  *RADIOLOGY REPORT*  Clinical Data: HIV.  Tachypnea.  Febrile.  PORTABLE CHEST - 1 VIEW  Comparison: 05/12/2012.  Findings: No interval change compared to prior.  Mild perihilar opacity is present, likely representing  atelectasis.  No pneumothorax.  No focal airspace disease is identified. Monitoring leads are projected over the chest.  Cardiopericardial silhouette within normal limits.  Stable prominence  of the right hilum.  IMPRESSION: No interval change.  Low volume chest.  Original Report Authenticated By: Andreas Newport, M.D.   Medications: I have reviewed the patient's current medications. Scheduled Meds:    . azithromycin  600 mg Oral Daily  . cefTRIAXone (ROCEPHIN)  IV  2 g Intravenous Q24H  . dapsone  100 mg Oral Daily  . enoxaparin (LOVENOX) injection  40 mg Subcutaneous Q24H  . ethambutol  15 mg/kg Oral Daily  . fluconazole (DIFLUCAN) IV  200 mg Intravenous Q24H  . metronidazole  500 mg Intravenous Q8H  . senna  1 tablet Oral BID  . sodium chloride  1,000 mL Intravenous Once  . vancomycin  1,000 mg Intravenous Q8H  . DISCONTD: azithromycin  1,200 mg Oral Q7 days  . DISCONTD: azithromycin  250 mg Oral Daily  . DISCONTD: azithromycin  500 mg Oral Daily  . DISCONTD: fluconazole (DIFLUCAN) IV  400 mg Intravenous Q24H  . DISCONTD: fluconazole  200 mg Oral Daily  . DISCONTD: piperacillin-tazobactam (ZOSYN)  IV  3.375 g Intravenous Q8H   Continuous Infusions:    . sodium chloride 1,000 mL (05/13/12 0331)  . DISCONTD: sodium chloride 1,000 mL (05/11/12 1734)  . DISCONTD: sodium chloride 1,000 mL (05/12/12 1501)   PRN Meds:.acetaminophen, acetaminophen, ibuprofen, iohexol, ondansetron (ZOFRAN) IV, ondansetron, promethazine, DISCONTD: acetaminophen, DISCONTD: acetaminophen, DISCONTD: acetaminophen, DISCONTD: acetaminophen Assessment/Plan: 68 man with HIV now AIDS, off ART for a year and now with CD4 < 10. Back with Dr. Drue Second and on dapsone, azithromycin, and diflucan pending ADAP to resume ART. Adm for tepm of 103 w/o localizing site of infection. Has thrush but on rx for > a week.   1. SIRS/ Fever: Given CD4 count 10 concern for sepsis from MAC versus bacteremia is valid. No acute evidence for pneumonia even with repeat CXR in AM after fluids to rule out PCP. Urine unremarkable. Blood cultures times 2 drawn in ED. Vanc and Zosyn started on eve of 8/10. Pt with persistent fever,  tachycardia, tacpnea on 8/11 AM. New headache on admission but no signs or symptoms concerning for meningitis or encephalitis. Pt also with pharyngitis x4 days, N/V/D x2 days. Dr. Drue Second following closely. Pt with low threshold for LP for CSF analysis. Transfered to stepdown. Required 2 NS 1L boluses in last 24 hours. Repeat CXR unchanged. Repeat CT head without contrast negative for acute changes, CT abdomen without contrast showing mild bowel inflammation with reactive lymph nodes. Zosyn stopped last night, pt started on Rocephin and flagyl IV. Pt currently also on dapsone 100mg  daily for PCP prophylaxis, Ethambutol for MAC empiric tx, fluconazole 200mg  IV for prophylaxis, and Vanc. WBC up today from 8.9 to 14.8. Pt afebrile this AM, with temp of 5F, had been febrile with Tmax of 103F.  - Given immunosupression will continue broad spectrum with vancomycin,   - Follow cultures  - Ordered: cryptococcal antigen serum, LDH, AFB blood culture, sputum for culture and pneumocystis.   2. Nausea/vomiting: Concern for bacterial vs viral illness given weakened immune system. CXR unremarkable and US abdomen unremarkable. Pt with reported vomiting during the day.  - Zofran for nausea  - NS @ 200 cc/hr overnight for some volume loss  - Full diet if tolerated   3. Pharyngitis. Pt with pharyngitis >4 days on admission. Petechial pharyngitis with no exudates, no  LAD. GI consulted for EGD. Preliminary report with no evidence for CMV or HSV esophagitis.  -Will follow up on viral and fungal cultures.   4. Diarrhea. Pt with at least 3 days of diarrhea. CT abdomen showing reactive abdominal lymph nodes. Sent stool for giardia, cryptosporidium/microsporidium.  -Will follow up on stool studies per above.   5. AIDS: CD4 last 10, has been off ARVs for some time now and is in process of getting back on medications. No concern for IRIS as he has not started meds yet. Case manager to assist pt with emergency Medicaid for  anti-retrovirals. Pt states that only his pastor knows of his HIV status, will continue caution.  - PPX with dapsone and azithromycin  - Tx for possible candidal esophagitis with fluconazole for 2 weeks started 7/30  - Per ID: emergency medicaid with prezista, norvir and truvada tomorrow   6. Pruritis: No rashes see on physical exam. Sx could be from Dapsone. Will continue to monitor.   7. DVT ppx: Lovenox [place] daily    LOS: 2 days   Ky Barban 05/13/2012, 10:31 AM

## 2012-05-14 ENCOUNTER — Encounter (HOSPITAL_COMMUNITY): Payer: Self-pay

## 2012-05-14 ENCOUNTER — Encounter (HOSPITAL_COMMUNITY): Payer: Self-pay | Admitting: Gastroenterology

## 2012-05-14 DIAGNOSIS — J029 Acute pharyngitis, unspecified: Secondary | ICD-10-CM | POA: Diagnosis present

## 2012-05-14 DIAGNOSIS — N179 Acute kidney failure, unspecified: Secondary | ICD-10-CM | POA: Diagnosis not present

## 2012-05-14 DIAGNOSIS — R197 Diarrhea, unspecified: Secondary | ICD-10-CM | POA: Diagnosis present

## 2012-05-14 DIAGNOSIS — D696 Thrombocytopenia, unspecified: Secondary | ICD-10-CM | POA: Diagnosis present

## 2012-05-14 LAB — BLOOD GAS, ARTERIAL
Drawn by: 249101
FIO2: 0.21 %
Patient temperature: 98.6
TCO2: 13.4 mmol/L (ref 0–100)
pCO2 arterial: 20.3 mmHg — ABNORMAL LOW (ref 35.0–45.0)
pH, Arterial: 7.414 (ref 7.350–7.450)

## 2012-05-14 LAB — COMPREHENSIVE METABOLIC PANEL
AST: 77 U/L — ABNORMAL HIGH (ref 0–37)
Albumin: 2.5 g/dL — ABNORMAL LOW (ref 3.5–5.2)
Calcium: 7.7 mg/dL — ABNORMAL LOW (ref 8.4–10.5)
Creatinine, Ser: 4.95 mg/dL — ABNORMAL HIGH (ref 0.50–1.35)
GFR calc non Af Amer: 14 mL/min — ABNORMAL LOW (ref >90)

## 2012-05-14 LAB — CBC WITH DIFFERENTIAL/PLATELET
Basophils Relative: 0 % (ref 0–1)
Eosinophils Relative: 1 % (ref 0–5)
Hemoglobin: 10.8 g/dL — ABNORMAL LOW (ref 13.0–17.0)
MCH: 27.1 pg (ref 26.0–34.0)
Monocytes Absolute: 0.2 10*3/uL (ref 0.1–1.0)
Monocytes Relative: 2 % — ABNORMAL LOW (ref 3–12)
Neutrophils Relative %: 92 % — ABNORMAL HIGH (ref 43–77)
RBC: 3.99 MIL/uL — ABNORMAL LOW (ref 4.22–5.81)

## 2012-05-14 LAB — CRYPTOSPORIDIUM SMEAR, FECAL: Cryptosporidium Smear.: NONE SEEN

## 2012-05-14 LAB — VANCOMYCIN, TROUGH: Vancomycin Tr: 41 ug/mL (ref 10.0–20.0)

## 2012-05-14 LAB — DIC (DISSEMINATED INTRAVASCULAR COAGULATION)PANEL
Fibrinogen: 705 mg/dL — ABNORMAL HIGH (ref 204–475)
Prothrombin Time: 17.2 seconds — ABNORMAL HIGH (ref 11.6–15.2)
Smear Review: NONE SEEN

## 2012-05-14 LAB — CLOSTRIDIUM DIFFICILE BY PCR: Toxigenic C. Difficile by PCR: NEGATIVE

## 2012-05-14 LAB — CMV ANTIBODY, IGG (EIA): CMV Ab - IgG: >6.5 — ABNORMAL HIGH (ref <0.90)

## 2012-05-14 LAB — PHOSPHORUS: Phosphorus: 3.1 mg/dL (ref 2.3–4.6)

## 2012-05-14 MED ORDER — POTASSIUM CHLORIDE IN NACL 40-0.9 MEQ/L-% IV SOLN
INTRAVENOUS | Status: DC
  Administered 2012-05-14 (×2): via INTRAVENOUS
  Filled 2012-05-14 (×6): qty 1000

## 2012-05-14 MED ORDER — PANTOPRAZOLE SODIUM 40 MG IV SOLR
40.0000 mg | Freq: Once | INTRAVENOUS | Status: AC
  Administered 2012-05-14: 40 mg via INTRAVENOUS
  Filled 2012-05-14: qty 40

## 2012-05-14 MED ORDER — FLUCONAZOLE 100MG IVPB
100.0000 mg | INTRAVENOUS | Status: DC
  Administered 2012-05-14 – 2012-05-19 (×6): 100 mg via INTRAVENOUS
  Filled 2012-05-14 (×6): qty 50

## 2012-05-14 MED ORDER — SODIUM CHLORIDE 0.45 % IV SOLN
INTRAVENOUS | Status: DC
  Administered 2012-05-14 (×2): via INTRAVENOUS
  Filled 2012-05-14 (×9): qty 1000

## 2012-05-14 MED ORDER — ENOXAPARIN SODIUM 30 MG/0.3ML ~~LOC~~ SOLN
30.0000 mg | SUBCUTANEOUS | Status: DC
  Filled 2012-05-14: qty 0.3

## 2012-05-14 NOTE — Progress Notes (Signed)
Clinical Social Work Department BRIEF PSYCHOSOCIAL ASSESSMENT 05/14/2012  Patient:  [id number],[id number]     Account Number:  [id number]     Admit date:  05/11/2012  Clinical Social Worker:  Dennison Bulla  Date/Time:  05/14/2012 12:00 N  Referred by:  RN  Date Referred:  05/14/2012 Referred for  Advanced Directives   Other Referral:   Interview type:  Patient Other interview type:    PSYCHOSOCIAL DATA Living Status:  FAMILY Admitted from facility:   Level of care:   Primary support name:  Jermon Primary support relationship to patient:  FRIEND Degree of support available:   Strong    CURRENT CONCERNS Current Concerns  Other - See comment   Other Concerns:   Advance directives/Medicaid    SOCIAL WORK ASSESSMENT / PLAN CSW received referral to assist with emergency Medicaid and HCPOA. CSW reviewed chart and met with patient at bedside. Friends were present. Patient agreeable to friends being involved in assessment.    CSW introduced myself and explained role. CSW provided patient with AD packet and assisted patient in completing packet. Patient was alert and oriented when completing information. CSW notarized packet, placed a copy in chart and gave patient original copy.    CSW called financial counselors Merry Proud) who reported that Andrey Campanile is working on [employer] application and will be in touch with patient. CSW is signing off but available if needed.   Assessment/plan status:  No Further Intervention Required Other assessment/ plan:   Information/referral to community resources:   Advanced directives packet-referral to financial counselors.    PATIENT'S/FAMILY'S RESPONSE TO PLAN OF CARE: Patient was alert and oriented. Patient agreeable to CSW consult. Patient appreciative of consult.

## 2012-05-14 NOTE — Progress Notes (Signed)
CRITICAL VALUE ALERT  Critical value received:  vanc trough 41.0  Date of notification:  05/14/2012  Time of notification:  0623  Critical value read back:yes  Nurse who received alert:  Leta Baptist, RN  MD notified (1st page):  Teaching service  Time of first page:  [phone]  MD notified (2nd page):  Time of second page:  Responding MD:  Lavena Bullion  Time MD responded:  [phone]

## 2012-05-14 NOTE — Consult Note (Signed)
Nephrology Service Initial Consult Note  Reason for Consult: Acute Kidney Failure Referring Physician: Dr. Tonny Branch  HPI: Zachary Hill is an 31 y.o. man, history of HIV (CD4 = 10, VL = 475K, 03/21/12), prior HBV (sAg neg, sAb pos, cAb pos, 03/21/12), presenting 8/10 with nausea, vomiting, SOB, and fevers, consulted for acute renal failure.  The patient initially presented to ID clinic 04/29/12 with flu-like symptoms of cough, SOB, fatigue, as well as painful swallowing.  Out of concern for esophageal candidaisis, he was prescribed fluconazole x14 days, bactrim (later changed to dapsone), and azithromycin.  The patient later developed nausea, vomiting, and fevers, and presented to [facility] on 05/11/12.    The patient's creatinine has risen from 0.91 on 8/10, to 4.95 on 8/13.  The patient was started on Vanc (s/p 6 doses, 8/10-8/13) and Zosyn (s/p 3 doses, 8/10-8/11).  The patient received 100 cc IV iohexol on 8/11 for a CT abdomen, with acute worsening of creatinine noted on 8/12.  Additionally, a vancomycin trough 8/13 showed a supratherapeutic level of 41.  The patient is currently on azithromycin (8/12-present, with 2 doses on 8/12).  His blood pressures on admission were in the SBP 130's, then dropped to the SBP 90's, and are now in the 120's.  Abdominal US 8/10 showed approx 11 cm kidneys with no hydronephrosis, and CT abd 8/11 also showed no hydronephrosis.  The patient notes urinating about twice/day for the last few days, producing dark yellow urine, though volumes are not strictly recorded. The patient has no personal or family history of kidney disease.  Other issues to note during his hospitalization include thrombocytopenia with a drop in platelets from 98 on 8/10, to 34 on 8/13 (no schistocytes), daily fevers (Tmax = 103.1), and mild hypokalemia.   PMH:   Past Medical History  Diagnosis Date  . HIV disease     CD4 = 10, VL = 475K, 03/21/12.  Medication non-compliance  . Hepatitis B     03/21/12 -  sAg neg, sAb pos, cAb pos, indication prior infection    PSH:   Past Surgical History  Procedure Date  . Esophagogastroduodenoscopy 05/13/2012    Procedure: ESOPHAGOGASTRODUODENOSCOPY (EGD);  Surgeon: Shirley Friar, MD;  Location: [facility] ENDOSCOPY;  Service: Endoscopy;  Laterality: N/A;    Allergies:  Allergies  Allergen Reactions  . Fruit & Vegetable Daily (Nutritional Supplements)     Allergic to RAW fruits and vegetables .  Marland Kitchen Bactrim (Sulfamethoxazole W-Trimethoprim) Itching, Rash and Other (See Comments)    "flu like symptoms"    Medications:   Prescriptions prior to admission  Medication Sig Dispense Refill  . azithromycin (ZITHROMAX) 600 MG tablet Take 2 tablets (1,200 mg total) by mouth every 7 (seven) days.  8 tablet  3  . dapsone 100 MG tablet Take 1 tablet (100 mg total) by mouth daily.  30 tablet  0  . darunavir (PREZISTA) 600 MG tablet Take 1 tablet (600 mg total) by mouth 2 (two) times daily with a meal.  60 tablet  5  . emtricitabine-tenofovir (TRUVADA) 200-300 MG per tablet Take 1 tablet by mouth daily.  30 tablet  5  . fluconazole (DIFLUCAN) 200 MG tablet Take 200 mg by mouth daily.      . ritonavir (NORVIR) 100 MG TABS Take 1 tablet (100 mg total) by mouth daily.  60 tablet  5   Scheduled Meds:   . azithromycin  600 mg Oral Daily  . dapsone  100 mg Oral Daily  .  ethambutol  15 mg/kg Oral Daily  . fluconazole (DIFLUCAN) IV  100 mg Intravenous Q24H  . senna  1 tablet Oral BID  . sodium chloride  2,000 mL Intravenous Once  . DISCONTD: cefTRIAXone (ROCEPHIN)  IV  2 g Intravenous Q24H  . DISCONTD: enoxaparin (LOVENOX) injection  30 mg Subcutaneous Q24H  . DISCONTD: enoxaparin (LOVENOX) injection  40 mg Subcutaneous Q24H  . DISCONTD: fluconazole (DIFLUCAN) IV  200 mg Intravenous Q24H  . DISCONTD: metronidazole  500 mg Intravenous Q8H  . DISCONTD: vancomycin  1,000 mg Intravenous Q8H   Continuous Infusions:   . 0.9 % NaCl with KCl 40 mEq / L 200 mL/hr at  05/14/12 1314  . DISCONTD: sodium chloride 1,000 mL (05/13/12 0331)  . DISCONTD: sodium chloride    . DISCONTD: sodium chloride     PRN Meds:.acetaminophen, acetaminophen, ibuprofen, ondansetron (ZOFRAN) IV, ondansetron, promethazine, DISCONTD: butamben-tetracaine-benzocaine, DISCONTD: fentaNYL, DISCONTD: midazolam, DISCONTD: ondansetron  Family History:  History reviewed. No pertinent family history. No family history of kidney disease  Social History:  Tobacco: Previously smoked 1/3 ppd x3 years Alcohol: "occasional" on the weekends Illicit drugs: denies Graduated high school, with some college.  Currently going to cosmetology school, nearly complete Can read/write Currently unemployed, looking for work Lives with "some people" (didn't want to elaborate), manages his own medications  ROS: General: no +fever, +decreased appetite Skin: no rash HEENT: no blurry vision, hearing changes, sore throat Pulm: see HPI CV: no chest pain, palpitations Abd: see HPI GU: no dysuria, hematuria, polyuria Ext: no arthralgias, myalgias Neuro: no weakness, numbness, or tingling  Blood pressure 123/77, pulse 117, temperature 100.1 F (37.8 C), temperature source Oral, resp. rate 30, height 5\' 6"  (1.676 m), weight 89.903 kg (198 lb 3.2 oz), SpO2 95.00%.  PEX General: sitting in bed, appears uncomfortable HEENT: +bilateral conjunctival injection noted, pupils equal round and reactive to light, vision grossly intact, oropharynx clear and non-erythematous  Neck: supple, no lymphadenopathy Lungs: clear to ascultation bilaterally, normal work of respiration, no wheezes, rales, ronchi Heart: regular rate and rhythm, no murmurs, gallops, or rubs Abdomen: soft, non-tender, non-distended, normal bowel sounds Extremities: no cyanosis, clubbing, or edema Neurologic: alert & oriented X3, cranial nerves II-XII intact, strength grossly intact, sensation intact to light touch, patient is right-handed  Creat    Date/Time Value Range Status  03/21/2012  3:32 PM 1.02  0.50 - 1.35 mg/dL Final     Creatinine, Ser  Date/Time Value Range Status  05/14/2012  5:30 AM 4.95* 0.50 - 1.35 mg/dL Final  1/61/0960  4:54 PM 3.99* 0.50 - 1.35 mg/dL Final  0/98/1191 47:82 AM 3.25* 0.50 - 1.35 mg/dL Final     DELTA CHECK NOTED  05/12/2012  5:23 AM 1.14  0.50 - 1.35 mg/dL Final  9/56/2130 86:57 AM 0.91  0.50 - 1.35 mg/dL Final    Results for orders placed during the hospital encounter of 05/11/12 (from the past 48 hour(s))  CULTURE, EXPECTORATED SPUTUM-ASSESSMENT     Status: Normal   Collection Time   05/12/12  5:04 PM      Component Value Range Comment   Specimen Description SPUTUM      Special Requests NONE      Sputum evaluation        Value: MICROSCOPIC FINDINGS SUGGEST THAT THIS SPECIMEN IS NOT REPRESENTATIVE OF LOWER RESPIRATORY SECRETIONS. PLEASE RECOLLECT.     CALLED TO MATTHEWS,M RN 05/12/12 1745 WOOTEN,K   Report Status 05/12/2012 FINAL     STOOL CULTURE  Status: Normal (Preliminary result)   Collection Time   05/12/12  7:01 PM      Component Value Range Comment   Specimen Description STOOL      Special Requests Immunocompromised      Culture Culture reincubated for better growth      Report Status PENDING     CRYPTOSPORIDIUM SMEAR, FECAL     Status: Normal   Collection Time   05/12/12  7:01 PM      Component Value Range Comment   Specimen Description STOOL      Special Requests NONE      Cryptosporidium Smear. NO Cryptosporidium Cyclospora or Isospora seen.      Report Status 05/14/2012 FINAL     LIPID PANEL     Status: Abnormal   Collection Time   05/12/12  7:32 PM      Component Value Range Comment   Cholesterol 141  0 - 200 mg/dL    Triglycerides 161 (*) <150 mg/dL    HDL 13 (*) >09 mg/dL    Total CHOL/HDL Ratio 10.8      VLDL 43 (*) 0 - 40 mg/dL    LDL Cholesterol 85  0 - 99 mg/dL   AFB CULTURE, BLOOD     Status: Normal (Preliminary result)   Collection Time   05/13/12  9:35 AM       Component Value Range Comment   Specimen Description BLOOD LEFT ARM      Special Requests 5CC      Culture        Value: CULTURE WILL BE EXAMINED FOR 6 WEEKS BEFORE ISSUING A FINAL REPORT   Report Status PENDING     BLOOD GAS, ARTERIAL     Status: Abnormal   Collection Time   05/13/12  9:38 AM      Component Value Range Comment   Delivery systems ROOM AIR      pH, Arterial 7.376  7.350 - 7.450    pCO2 arterial 25.3 (*) 35.0 - 45.0 mmHg    pO2, Arterial 93.3  80.0 - 100.0 mmHg    Bicarbonate 14.1 (*) 20.0 - 24.0 mEq/L    TCO2 14.8  0 - 100 mmol/L    Acid-base deficit 9.7 (*) 0.0 - 2.0 mmol/L    O2 Saturation 96.2      Patient temperature 102.0      Collection site RIGHT RADIAL      Drawn by [id number]      Sample type ARTERIAL DRAW      Allens test (pass/fail) PASS  PASS   CBC     Status: Abnormal   Collection Time   05/13/12 10:00 AM      Component Value Range Comment   WBC 14.8 (*) 4.0 - 10.5 K/uL    RBC 4.44  4.22 - 5.81 MIL/uL    Hemoglobin 12.0 (*) 13.0 - 17.0 g/dL    HCT 60.4 (*) 54.0 - 52.0 %    MCV 79.1  78.0 - 100.0 fL    MCH 27.0  26.0 - 34.0 pg    MCHC 34.2  30.0 - 36.0 g/dL    RDW 98.1  19.1 - 47.8 %    Platelets 83 (*) 150 - 400 K/uL CONSISTENT WITH PREVIOUS RESULT  BASIC METABOLIC PANEL     Status: Abnormal   Collection Time   05/13/12 10:00 AM      Component Value Range Comment   Sodium 134 (*) 135 - 145 mEq/L  Potassium 3.1 (*) 3.5 - 5.1 mEq/L    Chloride 102  96 - 112 mEq/L    CO2 14 (*) 19 - 32 mEq/L    Glucose, Bld 104 (*) 70 - 99 mg/dL    BUN 23  6 - 23 mg/dL    Creatinine, Ser 1.19 (*) 0.50 - 1.35 mg/dL DELTA CHECK NOTED   Calcium 8.2 (*) 8.4 - 10.5 mg/dL    GFR calc non Af Amer 24 (*) >90 mL/min    GFR calc Af Amer 28 (*) >90 mL/min   CULTURE, EXPECTORATED SPUTUM-ASSESSMENT     Status: Normal   Collection Time   05/13/12 11:03 AM      Component Value Range Comment   Specimen Description SPUTUM      Special Requests Immunocompromised       Sputum evaluation        Value: MICROSCOPIC FINDINGS SUGGEST THAT THIS SPECIMEN IS NOT REPRESENTATIVE OF LOWER RESPIRATORY SECRETIONS. PLEASE RECOLLECT.     Gram Stain Report Called to,Read Back By and Verified With: Ernestina Penna RN 12:05 05/13/12 ([email or web address])   Report Status 05/13/2012 FINAL     FUNGUS CULTURE W SMEAR     Status: Normal (Preliminary result)   Collection Time   05/13/12  4:41 PM      Component Value Range Comment   Specimen Description TISSUE ESOPHAGUS      Special Requests NONE      Fungal Smear NO YEAST OR FUNGAL ELEMENTS SEEN      Culture CULTURE IN PROGRESS FOR FOUR WEEKS      Report Status PENDING     BASIC METABOLIC PANEL     Status: Abnormal   Collection Time   05/13/12  7:18 PM      Component Value Range Comment   Sodium 137  135 - 145 mEq/L    Potassium 3.2 (*) 3.5 - 5.1 mEq/L    Chloride 103  96 - 112 mEq/L    CO2 17 (*) 19 - 32 mEq/L    Glucose, Bld 97  70 - 99 mg/dL    BUN 33 (*) 6 - 23 mg/dL    Creatinine, Ser 1.47 (*) 0.50 - 1.35 mg/dL    Calcium 8.1 (*) 8.4 - 10.5 mg/dL    GFR calc non Af Amer 19 (*) >90 mL/min    GFR calc Af Amer 21 (*) >90 mL/min   SODIUM, URINE, RANDOM     Status: Normal   Collection Time   05/13/12 10:43 PM      Component Value Range Comment   Sodium, Ur 52     CREATININE, URINE, RANDOM     Status: Normal   Collection Time   05/13/12 10:43 PM      Component Value Range Comment   Creatinine, Urine 224.99     URINALYSIS, ROUTINE W REFLEX MICROSCOPIC     Status: Abnormal   Collection Time   05/13/12 10:49 PM      Component Value Range Comment   Color, Urine ORANGE (*) YELLOW BIOCHEMICALS MAY BE AFFECTED BY COLOR   APPearance TURBID (*) CLEAR    Specific Gravity, Urine 1.027  1.005 - 1.030    pH 5.0  5.0 - 8.0    Glucose, UA NEGATIVE  NEGATIVE mg/dL    Hgb urine dipstick LARGE (*) NEGATIVE    Bilirubin Urine SMALL (*) NEGATIVE    Ketones, ur 15 (*) NEGATIVE mg/dL    Protein, ur 829 (*) NEGATIVE mg/dL  Urobilinogen, UA 0.2  0.0  - 1.0 mg/dL    Nitrite POSITIVE (*) NEGATIVE    Leukocytes, UA MODERATE (*) NEGATIVE   URINE MICROSCOPIC-ADD ON     Status: Abnormal   Collection Time   05/13/12 10:49 PM      Component Value Range Comment   Squamous Epithelial / LPF MANY (*) RARE    WBC, UA 7-10  <3 WBC/hpf    RBC / HPF 7-10  <3 RBC/hpf    Bacteria, UA FEW (*) RARE    Casts GRANULAR CAST (*) NEGATIVE    Urine-Other AMORPHOUS URATES/PHOSPHATES     VANCOMYCIN, TROUGH     Status: Abnormal   Collection Time   05/14/12  5:30 AM      Component Value Range Comment   Vancomycin Tr 41.0 (*) 10.0 - 20.0 ug/mL   COMPREHENSIVE METABOLIC PANEL     Status: Abnormal   Collection Time   05/14/12  5:30 AM      Component Value Range Comment   Sodium 138  135 - 145 mEq/L    Potassium 2.9 (*) 3.5 - 5.1 mEq/L    Chloride 107  96 - 112 mEq/L    CO2 15 (*) 19 - 32 mEq/L    Glucose, Bld 90  70 - 99 mg/dL    BUN 43 (*) 6 - 23 mg/dL    Creatinine, Ser 4.09 (*) 0.50 - 1.35 mg/dL    Calcium 7.7 (*) 8.4 - 10.5 mg/dL    Total Protein 6.5  6.0 - 8.3 g/dL    Albumin 2.5 (*) 3.5 - 5.2 g/dL    AST 77 (*) 0 - 37 U/L    ALT 67 (*) 0 - 53 U/L    Alkaline Phosphatase 133 (*) 39 - 117 U/L    Total Bilirubin 0.9  0.3 - 1.2 mg/dL    GFR calc non Af Amer 14 (*) >90 mL/min    GFR calc Af Amer 17 (*) >90 mL/min   CBC WITH DIFFERENTIAL     Status: Abnormal   Collection Time   05/14/12  5:30 AM      Component Value Range Comment   WBC 8.3  4.0 - 10.5 K/uL    RBC 3.99 (*) 4.22 - 5.81 MIL/uL    Hemoglobin 10.8 (*) 13.0 - 17.0 g/dL    HCT 81.1 (*) 91.4 - 52.0 %    MCV 77.2 (*) 78.0 - 100.0 fL    MCH 27.1  26.0 - 34.0 pg    MCHC 35.1  30.0 - 36.0 g/dL    RDW 78.2  95.6 - 21.3 %    Platelets 43 (*) 150 - 400 K/uL CONSISTENT WITH PREVIOUS RESULT   Neutrophils Relative 92 (*) 43 - 77 %    Lymphocytes Relative 5 (*) 12 - 46 %    Monocytes Relative 2 (*) 3 - 12 %    Eosinophils Relative 1  0 - 5 %    Basophils Relative 0  0 - 1 %    Neutro Abs 7.6   1.7 - 7.7 K/uL    Lymphs Abs 0.4 (*) 0.7 - 4.0 K/uL    Monocytes Absolute 0.2  0.1 - 1.0 K/uL    Eosinophils Absolute 0.1  0.0 - 0.7 K/uL    Basophils Absolute 0.0  0.0 - 0.1 K/uL    WBC Morphology TOXIC GRANULATION   INCREASED BANDS (>20% BANDS)  PHOSPHORUS     Status: Normal   Collection Time  05/14/12  5:30 AM      Component Value Range Comment   Phosphorus 3.1  2.3 - 4.6 mg/dL   BLOOD GAS, ARTERIAL     Status: Abnormal   Collection Time   05/14/12  8:00 AM      Component Value Range Comment   FIO2 0.21      pH, Arterial 7.414  7.350 - 7.450    pCO2 arterial 20.3 (*) 35.0 - 45.0 mmHg    pO2, Arterial 79.3 (*) 80.0 - 100.0 mmHg    Bicarbonate 12.7 (*) 20.0 - 24.0 mEq/L    TCO2 13.4  0 - 100 mmol/L    Acid-base deficit 11.0 (*) 0.0 - 2.0 mmol/L    O2 Saturation 95.1      Patient temperature 98.6      Collection site RIGHT RADIAL      Drawn by [phone]      Sample type ARTERIAL DRAW      Allens test (pass/fail) PASS  PASS   DIC (DISSEMINATED INTRAVASCULAR COAGULATION) PANEL     Status: Abnormal   Collection Time   05/14/12  9:54 AM      Component Value Range Comment   Prothrombin Time 17.2 (*) 11.6 - 15.2 seconds    INR 1.38  0.00 - 1.49    aPTT 47 (*) 24 - 37 seconds    Fibrinogen 705 (*) 204 - 475 mg/dL    D-Dimer, Quant 2.72 (*) 0.00 - 0.48 ug/mL-FEU    Platelets 34 (*) 150 - 400 K/uL CONSISTENT WITH PREVIOUS RESULT   Smear Review NO SCHISTOCYTES SEEN       Ct Head Wo Contrast  05/12/2012  *RADIOLOGY REPORT*  Clinical Data: Headache.  Diarrhea.  Fever.  HIV.  CT HEAD WITHOUT CONTRAST  Technique:  Contiguous axial images were obtained from the base of the skull through the vertex without contrast.  Comparison: None.  Findings: The brain stem, cerebellum, cerebral peduncles, thalami, basal ganglia, basilar cisterns, and ventricular system appear unremarkable.  No intracranial hemorrhage, mass lesion, or acute infarction is identified.  Chronic bilateral maxillary and ethmoid  sinusitis noted.  IMPRESSION:  1.  Chronic bilateral maxillary ethmoid sinusitis.   Otherwise, no significant abnormality identified.  Original Report Authenticated By: Dellia Cloud, M.D.   Ct Abdomen Pelvis W Contrast  05/12/2012  *RADIOLOGY REPORT*  Clinical Data: Fever.  Diarrhea.  Headache.  HIV.  Hepatitis B.  CT ABDOMEN AND PELVIS WITH CONTRAST  Technique:  Multidetector CT imaging of the abdomen and pelvis was performed following the standard protocol during bolus administration of intravenous contrast.  Contrast: OMNIPAQUE IOHEXOL 300 MG/ML  SOLN  Comparison: 05/11/2012  Findings: Linear subsegmental atelectasis present in both lower lobes.  Contracted gallbladder noted.  The liver, spleen, adrenal glands, and pancreas appear unremarkable.  The kidneys appear unremarkable, as do the proximal ureters.  Small porta hepatis, peripancreatic, and retroperitoneal lymph nodes are not pathologically enlarged by size criteria.  Air-fluid levels are present throughout the distal colon, characteristic for diarrheal process.  Fluid filled loops of small bowel are not dilated.  Scattered small mesenteric lymph nodes are not pathologically enlarged but are increased in number and may be reactive.  No significant colon wall thickening is observed.  No pelvic ascites.  The appendix appears normal.  IMPRESSION:  1.  Air fluid levels in nondilated small bowel and throughout the colon including the distal colon, suggesting diarrheal process and possible enteritis.  No significant abnormal bowel wall thickening  is observed.  Original Report Authenticated By: Dellia Cloud, M.D.   Dg Chest Port 1 View  05/13/2012  *RADIOLOGY REPORT*  Clinical Data: HIV.  Tachypnea.  Febrile.  PORTABLE CHEST - 1 VIEW  Comparison: 05/12/2012.  Findings: No interval change compared to prior.  Mild perihilar opacity is present, likely representing atelectasis.  No pneumothorax.  No focal airspace disease is identified.  Monitoring leads are projected over the chest.  Cardiopericardial silhouette within normal limits.  Stable prominence of the right hilum.  IMPRESSION: No interval change.  Low volume chest.  Original Report Authenticated By: Andreas Newport, M.D.    Assessment/Plan: The patient is a 31 yo man, history of HIV (CD4 = 10, VL = 475K, 03/21/12), prior HBV (sAg neg, sAb pos, cAb pos, 03/21/12), presenting 8/10 with nausea, vomiting, SOB, and fevers, consulted for acute renal failure.   # Acute Renal Failure - the patient presents with acute renal failure, likely initially representing contrast-induced nephropathy, complicated by vancomycin-induced nephropathy.  Other potential etiologies include AIN (though no rash, no peripheral eosinophilia) vs proximal RTA (secondary to azithromycin usage, possible non-gap metabolic acidosis).  Less likely HUS (no schistocytes on smear, normal LDH on 8/11, but drop in Hb).  No evidence of post-renal obstruction on abd [place] or CT. -agree with discontinuing vanc, per primary team -recheck vanc trough tomorrow; if still elevated, consider HD to remove vanc -continue IVF at 200 cc/hr, add bicarb  # Anemia - drop in Hb from 13.2 to 10.8 during admission, may represent initial hemoconcentration (given poor PO intake, nausea/vomiting prior to admission).  Ferritin = 1334 on 05/12/12. -check TIBC, saturation ratio  # Hypokalemia - likely secondary to GI losses -currently being repleted by primary team  # Thrombocytopenia - drop in platelets from 98 on admission to 34 today.  Per HIT score, pt has low probability of HIT (score = 3/8).  Other possibilities include HIV vs DIC. -DIC panel sent per primary  Signed, Janalyn Harder   05/14/2012, 3:42 PM   Zachary Hill was admitted with normal renal function.. He had "flu like symptoms" and diarrhea. He got CT abd and pelvis on 11 Aug (when Cr had gone from 0.91 to 1.14).  Cr rose to 3.25 the next day and has continued to rise.  In  addition, vancomycin level (trough) is >40.  IV fluids should continue.  I'd suggest addition of bicarb to IV fluids.  There's mention of his being on Tenofovir, but he apparently has not taken any ART in over a year.  I'm hopeful renal fct will improve over next few days, but vanco toxicity may take a while to revert.

## 2012-05-14 NOTE — Progress Notes (Signed)
Subjective: He has had intermit tend fever with Tmax at 102.62F at 1900. He had diarrhea and vomiting last night. His sore throat is improving as well as his abdominal pain and headache.  He tolerated the EGD well yesterday and does not complain of pain with swallowing.   He denies confusion, stiff or painful neck, rash, dysuria, hematuria, or blood in his stools.     Objective: Vital signs in last 24 hours: Filed Vitals:   05/13/12 1945 05/13/12 2045 05/14/12 0000 05/14/12 0400  BP: 101/80  106/69 117/67  Pulse: 127 129 124 119  Temp: 102.1 F (38.9 C) 98.7 F (37.1 C) 100.2 F (37.9 C) 98.4 F (36.9 C)  TempSrc: Oral Oral Oral Oral  Resp: 26 30 28 29   Height:      Weight:      SpO2: 98% 96% 96% 97%   Weight change:   Intake/Output Summary (Last 24 hours) at 05/14/12 0728 Last data filed at 05/14/12 0600  Gross per 24 hour  Intake   4700 ml  Output    350 ml  Net   4350 ml   Vitals reviewed.  General: resting in bed, tired appearing, in NAD  HEENT: bilateral conjunctivitis with no eye discharge, petechial pharyngitis with less erythema, minimum thrush plaque on his tongue. No cervical or axillary LAD.  Cardiac: tachycardic, no rubs, murmurs or gallops  Pulm: clear to auscultation bilaterally, no wheezes, rales, or rhonchi  Abd: soft, mid epigastric tenderness, nondistended, BS present  Ext: warm and well perfused, no pedal edema, plantar or palmar rash. No rash noted on his abdomen, back, chest, UE or LE.  Neuro: alert and oriented X3, cranial nerves II-XII grossly intact, strength and sensation to light touch equal in bilateral upper and lower extremities   Lab Results: Basic Metabolic Panel:  Lab 05/14/12 4098 05/13/12 1918  NA 138 137  K 2.9* 3.2*  CL 107 103  CO2 15* 17*  GLUCOSE 90 97  BUN 43* 33*  CREATININE 4.95* 3.99*  CALCIUM 7.7* 8.1*  MG -- --  PHOS 3.1 --   Liver Function Tests:  Lab 05/14/12 0530 05/12/12 0523  AST 77* 77*  ALT 67* 120*    ALKPHOS 133* 256*  BILITOT 0.9 1.7*  PROT 6.5 7.1  ALBUMIN 2.5* 3.2*    Lab 05/11/12 1347  LIPASE 52  AMYLASE --   CBC:  Lab 05/14/12 0530 05/13/12 1000 05/11/12 1159  WBC 8.3 14.8* --  NEUTROABS 7.6 -- 8.2*  HGB 10.8* 12.0* --  HCT 30.8* 35.1* --  MCV 77.2* 79.1 --  PLT 43* 83* --   Fasting Lipid Panel:  Lab 05/12/12 1932  CHOL 141  HDL 13*  LDLCALC 85  TRIG 119*  CHOLHDL 10.8  LDLDIRECT --   Anemia Panel:  Lab 05/12/12 1234  VITAMINB12 --  FOLATE --  FERRITIN 1334*  TIBC --  IRON --  RETICCTPCT --   Urinalysis:  Lab 05/13/12 2249 05/11/12 1218  COLORURINE ORANGE* ORANGE*  LABSPEC 1.027 1.029  PHURINE 5.0 6.0  GLUCOSEU NEGATIVE NEGATIVE  HGBUR LARGE* NEGATIVE  BILIRUBINUR SMALL* SMALL*  KETONESUR 15* 15*  PROTEINUR 100* 100*  UROBILINOGEN 0.2 4.0*  NITRITE POSITIVE* NEGATIVE  LEUKOCYTESUR MODERATE* TRACE*    Micro Results: Recent Results (from the past 240 hour(s))  URINE CULTURE     Status: Normal   Collection Time   05/11/12 12:18 PM      Component Value Range Status Comment   Specimen Description URINE, CATHETERIZED  Final    Special Requests NONE   Final    Culture  Setup Time 05/11/2012 22:56   Final    Colony Count NO GROWTH   Final    Culture NO GROWTH   Final    Report Status 05/12/2012 FINAL   Final   CULTURE, BLOOD (ROUTINE X 2)     Status: Normal (Preliminary result)   Collection Time   05/11/12  4:10 PM      Component Value Range Status Comment   Specimen Description BLOOD LEFT ARM   Final    Special Requests BOTTLES DRAWN AEROBIC AND ANAEROBIC 10CC   Final    Culture  Setup Time 05/11/2012 22:55   Final    Culture     Final    Value:        BLOOD CULTURE RECEIVED NO GROWTH TO DATE CULTURE WILL BE HELD FOR 5 DAYS BEFORE ISSUING A FINAL NEGATIVE REPORT   Report Status PENDING   Incomplete   CULTURE, BLOOD (ROUTINE X 2)     Status: Normal (Preliminary result)   Collection Time   05/11/12  4:25 PM      Component Value Range  Status Comment   Specimen Description BLOOD LEFT HAND   Final    Special Requests BOTTLES DRAWN AEROBIC AND ANAEROBIC 10CC   Final    Culture  Setup Time 05/11/2012 22:55   Final    Culture     Final    Value:        BLOOD CULTURE RECEIVED NO GROWTH TO DATE CULTURE WILL BE HELD FOR 5 DAYS BEFORE ISSUING A FINAL NEGATIVE REPORT   Report Status PENDING   Incomplete   AFB CULTURE, BLOOD     Status: Normal (Preliminary result)   Collection Time   05/12/12 12:33 PM      Component Value Range Status Comment   Specimen Description BLOOD LEFT ARM   Final    Special Requests BOTTLES DRAWN AEROBIC ONLY 3CC   Final    Culture     Final    Value: CULTURE WILL BE EXAMINED FOR 6 WEEKS BEFORE ISSUING A FINAL REPORT   Report Status PENDING   Incomplete   MRSA PCR SCREENING     Status: Normal   Collection Time   05/12/12  3:35 PM      Component Value Range Status Comment   MRSA by PCR NEGATIVE  NEGATIVE Final   CULTURE, EXPECTORATED SPUTUM-ASSESSMENT     Status: Normal   Collection Time   05/12/12  5:04 PM      Component Value Range Status Comment   Specimen Description SPUTUM   Final    Special Requests NONE   Final    Sputum evaluation     Final    Value: MICROSCOPIC FINDINGS SUGGEST THAT THIS SPECIMEN IS NOT REPRESENTATIVE OF LOWER RESPIRATORY SECRETIONS. PLEASE RECOLLECT.     CALLED TO MATTHEWS,M RN 05/12/12 1745 WOOTEN,K   Report Status 05/12/2012 FINAL   Final   STOOL CULTURE     Status: Normal (Preliminary result)   Collection Time   05/12/12  7:01 PM      Component Value Range Status Comment   Specimen Description STOOL   Final    Special Requests Immunocompromised   Final    Culture Culture reincubated for better growth   Final    Report Status PENDING   Incomplete   AFB CULTURE, BLOOD     Status: Normal (Preliminary result)  Collection Time   05/13/12  9:35 AM      Component Value Range Status Comment   Specimen Description BLOOD LEFT ARM   Final    Special Requests 5CC   Final     Culture     Final    Value: CULTURE WILL BE EXAMINED FOR 6 WEEKS BEFORE ISSUING A FINAL REPORT   Report Status PENDING   Incomplete   CULTURE, EXPECTORATED SPUTUM-ASSESSMENT     Status: Normal   Collection Time   05/13/12 11:03 AM      Component Value Range Status Comment   Specimen Description SPUTUM   Final    Special Requests Immunocompromised   Final    Sputum evaluation     Final    Value: MICROSCOPIC FINDINGS SUGGEST THAT THIS SPECIMEN IS NOT REPRESENTATIVE OF LOWER RESPIRATORY SECRETIONS. PLEASE RECOLLECT.     Gram Stain Report Called to,Read Back By and Verified With: Ernestina Penna RN 12:05 05/13/12 ([email or web address])   Report Status 05/13/2012 FINAL   Final    Studies/Results: Ct Head Wo Contrast  05/12/2012  *RADIOLOGY REPORT*  Clinical Data: Headache.  Diarrhea.  Fever.  HIV.  CT HEAD WITHOUT CONTRAST  Technique:  Contiguous axial images were obtained from the base of the skull through the vertex without contrast.  Comparison: None.  Findings: The brain stem, cerebellum, cerebral peduncles, thalami, basal ganglia, basilar cisterns, and ventricular system appear unremarkable.  No intracranial hemorrhage, mass lesion, or acute infarction is identified.  Chronic bilateral maxillary and ethmoid sinusitis noted.  IMPRESSION:  1.  Chronic bilateral maxillary ethmoid sinusitis.   Otherwise, no significant abnormality identified.  Original Report Authenticated By: Dellia Cloud, M.D.   Ct Abdomen Pelvis W Contrast  05/12/2012  *RADIOLOGY REPORT*  Clinical Data: Fever.  Diarrhea.  Headache.  HIV.  Hepatitis B.  CT ABDOMEN AND PELVIS WITH CONTRAST  Technique:  Multidetector CT imaging of the abdomen and pelvis was performed following the standard protocol during bolus administration of intravenous contrast.  Contrast: OMNIPAQUE IOHEXOL 300 MG/ML  SOLN  Comparison: 05/11/2012  Findings: Linear subsegmental atelectasis present in both lower lobes.  Contracted gallbladder noted.  The liver, spleen,  adrenal glands, and pancreas appear unremarkable.  The kidneys appear unremarkable, as do the proximal ureters.  Small porta hepatis, peripancreatic, and retroperitoneal lymph nodes are not pathologically enlarged by size criteria.  Air-fluid levels are present throughout the distal colon, characteristic for diarrheal process.  Fluid filled loops of small bowel are not dilated.  Scattered small mesenteric lymph nodes are not pathologically enlarged but are increased in number and may be reactive.  No significant colon wall thickening is observed.  No pelvic ascites.  The appendix appears normal.  IMPRESSION:  1.  Air fluid levels in nondilated small bowel and throughout the colon including the distal colon, suggesting diarrheal process and possible enteritis.  No significant abnormal bowel wall thickening is observed.  Original Report Authenticated By: Dellia Cloud, M.D.   Dg Chest Port 1 View  05/13/2012  *RADIOLOGY REPORT*  Clinical Data: HIV.  Tachypnea.  Febrile.  PORTABLE CHEST - 1 VIEW  Comparison: 05/12/2012.  Findings: No interval change compared to prior.  Mild perihilar opacity is present, likely representing atelectasis.  No pneumothorax.  No focal airspace disease is identified. Monitoring leads are projected over the chest.  Cardiopericardial silhouette within normal limits.  Stable prominence of the right hilum.  IMPRESSION: No interval change.  Low volume chest.  Original Report Authenticated By:  Andreas Newport, M.D.   Medications: I have reviewed the patient's current medications. Scheduled Meds:   . azithromycin  600 mg Oral Daily  . cefTRIAXone (ROCEPHIN)  IV  2 g Intravenous Q24H  . dapsone  100 mg Oral Daily  . enoxaparin (LOVENOX) injection  40 mg Subcutaneous Q24H  . ethambutol  15 mg/kg Oral Daily  . fluconazole (DIFLUCAN) IV  200 mg Intravenous Q24H  . metronidazole  500 mg Intravenous Q8H  . senna  1 tablet Oral BID  . sodium chloride  1,000 mL Intravenous Once  .  sodium chloride  2,000 mL Intravenous Once  . DISCONTD: azithromycin  500 mg Oral Daily  . DISCONTD: vancomycin  1,000 mg Intravenous Q8H   Continuous Infusions:   . 0.9 % NaCl with KCl 40 mEq / L 200 mL/hr at 05/14/12 0718  . DISCONTD: sodium chloride 1,000 mL (05/13/12 0331)  . DISCONTD: sodium chloride    . DISCONTD: sodium chloride     PRN Meds:.acetaminophen, acetaminophen, ibuprofen, ondansetron (ZOFRAN) IV, ondansetron, promethazine, DISCONTD: butamben-tetracaine-benzocaine, DISCONTD: fentaNYL, DISCONTD: midazolam, DISCONTD: ondansetron Assessment/Plan: 45 man with HIV now AIDS, off ART for a year and now with CD4 < 10. Back with Dr. Drue Second and on dapsone, azithromycin, and diflucan pending ADAP to resume ART. Adm for tepm of 103 w/o localizing site of infection. Has thrush but on rx for > a week.   1. SIRS/ Fever: Given CD4 count 10 concern for sepsis from MAC versus bacteremia is valid. No acute evidence for pneumonia even with repeat CXR in AM after fluids to rule out PCP. Urine unremarkable. Blood cultures times 2 drawn in ED. Vanc and Zosyn started on eve of 8/10. Pt with persistent fever, tachycardia, tacpnea on 8/11 AM. New headache on admission but no signs or symptoms concerning for meningitis or encephalitis. Pt also with pharyngitis x4 days, N/V/D x2 days. Dr. Drue Second following closely. Pt with low threshold for LP for CSF analysis. Transfered to stepdown. Required 2 NS 1L boluses in last 24 hours. Repeat CXR unchanged. Repeat CT head without contrast negative for acute changes, CT abdomen without contrast showing mild bowel inflammation with reactive lymph nodes. Zosyn stopped on eve of 8/11, pt started on Rocephin and flagyl IV that night. Pt currently also on dapsone 100mg  daily for PCP prophylaxis, Ethambutol for MAC empiric tx, fluconazole 200mg  IV for prophylaxis but now decreased to 100mg  2/2 AKI. Discontinued Vanc 2/2 AKI. WBC up from 8.9 to 14.8 yesterday but down to 8.3  today. Pt afebrile yesterday with temp of 739F, then febrile again with Tmax of 102F at 7PM. Remains afebrile today but with temp as high as 100.39F, will continue to monitor.  - Discontinue flagyl today - LP with CSF analysis, cultures if pt not improving tomorrow - Consider MRI head tomorrow - Given immunosupression will continue broad spectrum but discontinue vancomycin 2/2 AKI - Follow up on cultures  - LDH normal -Toxoplasma antibody: negative -CMV culture: pending -AFB culture: pending -Blood cultures: pending -Herpes simplex cx: pending -Fungal culture with smear: pending -Respiratory culture: not of lower respiratory airway -Stool culture: pending -Urine culture: NGTD -Ova and parasite: pending - C diff stool PCR: pending  2. Nausea/vomiting: Concern for bacterial vs viral illness given weakened immune system. CXR unremarkable and US abdomen unremarkable. Pt with reported vomiting overnight.  - Zofran for nausea  - NS @ 200 cc/hr overnight for some volume loss  - Full diet if tolerated   3. Acute kidney injury. Pt  with creatinine up from ~1.4 to 3.99-->4.85.  Vanc level 41. UA dirty, with granular casts but FeNa 0.6%. This could be 2/2 Vanc superimposed on mild prerenal azotemia 2/2 emesis, decreased PO v. IV dye contrast toxicity. Vancomycin now stopped. Decreased Lovenox to 30mg  SQ (Cr Cl <30). Decreased fluconazole to 100mg  q24h, (Cr Cl <50). Pt now s/p NS 3L boluses yesterday and hydration with NS at 226ml/h. Total input ~4.5L, total output not fully recorded 2/2 pt voiding with diarrhea.  -Continue 1/2 NS at 211ml/h with KCL 27mEQ/L and bicarb (per renal) -Continue trending Creatinine -Renal consulted, appreciate recommendations -Condom cath for urine output  4. Thrombocytopenia. Pt with platelets at 98 on admission, trended down to 83, now 43. Hg down but not remarkably. Concern for DIC thought could also be explained by AIDS and sepsis.  -DIC panel with no  schistocytes, elevated D-dimer but elevated fibrinogen  5. Pharyngitis. Improving. Pt with pharyngitis >4 days on admission. Petechial pharyngitis with no exudates, no LAD. GI consulted for EGD. Preliminary report with no evidence for CMV or HSV esophagitis. Sore throat improving.  -Will follow up on viral and fungal cultures.   6. Diarrhea. Pt with at least 4 days of diarrhea. CT abdomen showing reactive abdominal lymph nodes. Sent stool for giardia, cryptosporidium/microsporidium.  -Will follow up on stool studies per above.   7. AIDS: CD4 last 10, has been off ARVs for some time now and is in process of getting back on medications. No concern for IRIS as he has not started meds yet. Case manager to assist pt with emergency Medicaid for anti-retrovirals. Pt states that only his pastor knows of his HIV status, will continue caution.  - PPX with dapsone and azithromycin  - Tx for possible candidal esophagitis with fluconazole for 2 weeks started 7/30  - Per ID: emergency medicaid with prezista, norvir and truvada possibly today  8. Pruritis: No rashes see on physical exam. Sx could be from Dapsone. Will continue to monitor.   9. DVT ppx:  SCDs. Discontinued Lovenox 2/2 decreased platelets. (From 83 to 43)   LOS: 3 days   Ky Barban 05/14/2012, 7:28 AM

## 2012-05-14 NOTE — Progress Notes (Signed)
Pt's resp rate up to 34 and fells as if it is hard to breathe. Placed on 02 at 2lt nasal cannula.  Continue to watch.

## 2012-05-14 NOTE — Progress Notes (Signed)
[facility]    Date of Admission:  05/11/2012   Total days of antibiotics: 4        Day Dapsone: 4        Day Flagyl: 3        Day Azithromycin:2                                                                                     Day Ethambutol: 2                                                                                      Day Fluconazole: 1 Principal Problem:  *Fever Active Problems:  Human immunodeficiency virus (HIV) disease  Nausea & vomiting  Odynophagia  Diarrhea  Pharyngitis  Acute kidney injury  Thrombocytopenia      . azithromycin  600 mg Oral Daily  . dapsone  100 mg Oral Daily  . ethambutol  15 mg/kg Oral Daily  . fluconazole (DIFLUCAN) IV  100 mg Intravenous Q24H  . metronidazole  500 mg Intravenous Q8H  . senna  1 tablet Oral BID  . sodium chloride  2,000 mL Intravenous Once  . DISCONTD: cefTRIAXone (ROCEPHIN)  IV  2 g Intravenous Q24H  . DISCONTD: enoxaparin (LOVENOX) injection  30 mg Subcutaneous Q24H  . DISCONTD: enoxaparin (LOVENOX) injection  40 mg Subcutaneous Q24H  . DISCONTD: fluconazole (DIFLUCAN) IV  200 mg Intravenous Q24H  . DISCONTD: vancomycin  1,000 mg Intravenous Q8H    Subjective: Zachary Hill 31 yo male with HIV with a CD count of 10, prior Hepatitis B, presents with nausea, vomiting, SOB, fever and chills on 08/10.The patient initially presented to ID clinic 04/29/12 with flu-like symptoms of cough, SOB, fatigue, as well as painful swallowing. Out of concern for esophageal candidaisis, he was prescribed fluconazole x14 days, bactrim (later changed to dapsone), and azithromycin. The patient later developed nausea, vomiting, and fevers, and presented to [facility] on 05/11/12.  He had worsening creatinine rising from 0.91 on 08/10 to 4.95 on 08/13. He received 6 doses of Vancomycin from 08/10-08/13 and 3 doses of zosyn from 08/10 -08/11. His vancomycin trough was supratherapeutic level of 41. He was also given contrast  for CT abdomen on 08/11. This morning he had a fever with T.max of 103 with chills. He complaints his nausea and vomiting got worse. He also reports episodes of diarrhea with watery brown colored stools not associated with abdominal pain. He still complaints of epigastric pain.    Objective: Temp:  [98.4 F (36.9 C)-102.1 F (38.9 C)] 100.1 F (37.8 C) (08/13 0824) Pulse Rate:  [117-129] 117  (08/13 0824) Resp:  [17-42] 30  (08/13 0824) BP: (97-124)/(56-83) 123/77 mmHg (08/13 0821) SpO2:  [95 %-99 %] 95 % (08/13 0824)  General: sitting in bed, appears uncomfortable,  A&OX3 HEENT: +bilateral conjunctival injection noted, Thrush noted on tongue  Neck: supple, no lymphadenopathy Lungs: CTAB,  no wheezes, rales, ronchi Heart: RRR, no murmurs, gallops, or rubs Abdomen: soft, non-tender, non-distended, normal bowel sounds  Extremities: no cyanosis, clubbing, or edema   Lab Results Lab Results  Component Value Date   WBC 8.3 05/14/2012   HGB 10.8* 05/14/2012   HCT 30.8* 05/14/2012   MCV 77.2* 05/14/2012   PLT 43* 05/14/2012    Lab Results  Component Value Date   CREATININE 4.95* 05/14/2012   BUN 43* 05/14/2012   NA 138 05/14/2012   K 2.9* 05/14/2012   CL 107 05/14/2012   CO2 15* 05/14/2012    Lab Results  Component Value Date   ALT 67* 05/14/2012   AST 77* 05/14/2012   ALKPHOS 133* 05/14/2012   BILITOT 0.9 05/14/2012      Microbiology: Recent Results (from the past 240 hour(s))  URINE CULTURE     Status: Normal   Collection Time   05/11/12 12:18 PM      Component Value Range Status Comment   Specimen Description URINE, CATHETERIZED   Final    Special Requests NONE   Final    Culture  Setup Time 05/11/2012 22:56   Final    Colony Count NO GROWTH   Final    Culture NO GROWTH   Final    Report Status 05/12/2012 FINAL   Final   CULTURE, BLOOD (ROUTINE X 2)     Status: Normal (Preliminary result)   Collection Time   05/11/12  4:10 PM      Component Value Range Status Comment    Specimen Description BLOOD LEFT ARM   Final    Special Requests BOTTLES DRAWN AEROBIC AND ANAEROBIC 10CC   Final    Culture  Setup Time 05/11/2012 22:55   Final    Culture     Final    Value:        BLOOD CULTURE RECEIVED NO GROWTH TO DATE CULTURE WILL BE HELD FOR 5 DAYS BEFORE ISSUING A FINAL NEGATIVE REPORT   Report Status PENDING   Incomplete   CULTURE, BLOOD (ROUTINE X 2)     Status: Normal (Preliminary result)   Collection Time   05/11/12  4:25 PM      Component Value Range Status Comment   Specimen Description BLOOD LEFT HAND   Final    Special Requests BOTTLES DRAWN AEROBIC AND ANAEROBIC 10CC   Final    Culture  Setup Time 05/11/2012 22:55   Final    Culture     Final    Value:        BLOOD CULTURE RECEIVED NO GROWTH TO DATE CULTURE WILL BE HELD FOR 5 DAYS BEFORE ISSUING A FINAL NEGATIVE REPORT   Report Status PENDING   Incomplete   AFB CULTURE, BLOOD     Status: Normal (Preliminary result)   Collection Time   05/12/12 12:33 PM      Component Value Range Status Comment   Specimen Description BLOOD LEFT ARM   Final    Special Requests BOTTLES DRAWN AEROBIC ONLY 3CC   Final    Culture     Final    Value: CULTURE WILL BE EXAMINED FOR 6 WEEKS BEFORE ISSUING A FINAL REPORT   Report Status PENDING   Incomplete   MRSA PCR SCREENING     Status: Normal   Collection Time   05/12/12  3:35 PM      Component  Value Range Status Comment   MRSA by PCR NEGATIVE  NEGATIVE Final   CULTURE, EXPECTORATED SPUTUM-ASSESSMENT     Status: Normal   Collection Time   05/12/12  5:04 PM      Component Value Range Status Comment   Specimen Description SPUTUM   Final    Special Requests NONE   Final    Sputum evaluation     Final    Value: MICROSCOPIC FINDINGS SUGGEST THAT THIS SPECIMEN IS NOT REPRESENTATIVE OF LOWER RESPIRATORY SECRETIONS. PLEASE RECOLLECT.     CALLED TO MATTHEWS,M RN 05/12/12 1745 WOOTEN,K   Report Status 05/12/2012 FINAL   Final   STOOL CULTURE     Status: Normal (Preliminary result)    Collection Time   05/12/12  7:01 PM      Component Value Range Status Comment   Specimen Description STOOL   Final    Special Requests Immunocompromised   Final    Culture Culture reincubated for better growth   Final    Report Status PENDING   Incomplete   AFB CULTURE, BLOOD     Status: Normal (Preliminary result)   Collection Time   05/13/12  9:35 AM      Component Value Range Status Comment   Specimen Description BLOOD LEFT ARM   Final    Special Requests 5CC   Final    Culture     Final    Value: CULTURE WILL BE EXAMINED FOR 6 WEEKS BEFORE ISSUING A FINAL REPORT   Report Status PENDING   Incomplete   CULTURE, EXPECTORATED SPUTUM-ASSESSMENT     Status: Normal   Collection Time   05/13/12 11:03 AM      Component Value Range Status Comment   Specimen Description SPUTUM   Final    Special Requests Immunocompromised   Final    Sputum evaluation     Final    Value: MICROSCOPIC FINDINGS SUGGEST THAT THIS SPECIMEN IS NOT REPRESENTATIVE OF LOWER RESPIRATORY SECRETIONS. PLEASE RECOLLECT.     Gram Stain Report Called to,Read Back By and Verified With: Ernestina Penna RN 12:05 05/13/12 ([email or web address])   Report Status 05/13/2012 FINAL   Final     Studies/Results: Ct Head Wo Contrast  05/12/2012  *RADIOLOGY REPORT*  Clinical Data: Headache.  Diarrhea.  Fever.  HIV.  CT HEAD WITHOUT CONTRAST  Technique:  Contiguous axial images were obtained from the base of the skull through the vertex without contrast.  Comparison: None.  Findings: The brain stem, cerebellum, cerebral peduncles, thalami, basal ganglia, basilar cisterns, and ventricular system appear unremarkable.  No intracranial hemorrhage, mass lesion, or acute infarction is identified.  Chronic bilateral maxillary and ethmoid sinusitis noted.  IMPRESSION:  1.  Chronic bilateral maxillary ethmoid sinusitis.   Otherwise, no significant abnormality identified.  Original Report Authenticated By: Dellia Cloud, M.D.   Ct Abdomen Pelvis W  Contrast  05/12/2012  *RADIOLOGY REPORT*  Clinical Data: Fever.  Diarrhea.  Headache.  HIV.  Hepatitis B.  CT ABDOMEN AND PELVIS WITH CONTRAST  Technique:  Multidetector CT imaging of the abdomen and pelvis was performed following the standard protocol during bolus administration of intravenous contrast.  Contrast: OMNIPAQUE IOHEXOL 300 MG/ML  SOLN  Comparison: 05/11/2012  Findings: Linear subsegmental atelectasis present in both lower lobes.  Contracted gallbladder noted.  The liver, spleen, adrenal glands, and pancreas appear unremarkable.  The kidneys appear unremarkable, as do the proximal ureters.  Small porta hepatis, peripancreatic, and retroperitoneal lymph nodes are not pathologically enlarged  by size criteria.  Air-fluid levels are present throughout the distal colon, characteristic for diarrheal process.  Fluid filled loops of small bowel are not dilated.  Scattered small mesenteric lymph nodes are not pathologically enlarged but are increased in number and may be reactive.  No significant colon wall thickening is observed.  No pelvic ascites.  The appendix appears normal.  IMPRESSION:  1.  Air fluid levels in nondilated small bowel and throughout the colon including the distal colon, suggesting diarrheal process and possible enteritis.  No significant abnormal bowel wall thickening is observed.  Original Report Authenticated By: Dellia Cloud, M.D.   Dg Chest Port 1 View  05/13/2012  *RADIOLOGY REPORT*  Clinical Data: HIV.  Tachypnea.  Febrile.  PORTABLE CHEST - 1 VIEW  Comparison: 05/12/2012.  Findings: No interval change compared to prior.  Mild perihilar opacity is present, likely representing atelectasis.  No pneumothorax.  No focal airspace disease is identified. Monitoring leads are projected over the chest.  Cardiopericardial silhouette within normal limits.  Stable prominence of the right hilum.  IMPRESSION: No interval change.  Low volume chest.  Original Report Authenticated  By: Andreas Newport, M.D.    Assessment/Plan: Mr. Mayeda is a 31 yo male with, history of HIV (CD4 = 10, VL = 475K, 03/21/12), prior HBV  presenting 8/10 with nausea, vomiting, SOB, and fevers, hospital course complicated by acute renal failure.  1) HIV:  - Would hold prezista, norvir and truvada for now, as Truvada is known to be nephrotoxic - Continue Dapsone for PCP prophylaxis. - 2) Fevers: - Still spikes fever with chills as high as 103F - Upper GI endoscopy done rules out CMV and HSV esophagitis but biopsies pending - Might be due to not treating HIV or infectious process - Blood cultures and stool cultures, ova and parasite examination pending - urine cultures NGTD - C.Diff by pcr negative  3) Acute Kidney injury: - likely due to antibiotics or hypovolemia or contrast induced - Nephrology consulted  4) nausea/vomiting: - Likely due to antibiotics - Would discontinue antibiotics Azithromycin, ethambutol and flagyl. - 5) Diarrhea: - C.Diff negative - Hold off antibiotics to be improvement - Would consider colonoscopy to rule out infection ds CT abd showed colitis - Would like to rule out parasitic infections like cryptosporidium, micro sporidium, Cyclospora, giardia.  6) Headache: - Pt reports it to be stress headache - would like to rule out infectious causes of headache in pt with HIV like CMV, toxoplasma, HSV or mass lesions like CNS lymphoma. - MRI head  - LP with opening pressure and CSF analysis  Cryptococcal antigen and PCR for CMV, toxoplasma.  Would discuss plan with Dr. Daiva Eves  INFECTIOUS DISEASE ATTENDING ADDENDUM:     [facility]   Date: 05/14/2012  Patient name: Zachary Hill  Medical record number: [id number]  Date of birth: 07/26/1981    This patient has been seen and discussed with the house staff. Please see their note for complete details. I concur with their findings with the following  additions/corrections:  Patient now with AKI that may be related to contrast, vancomycin, fluid depletion in setting of vomiting, diarrrhea. Patient being seen by Nephrology.    Workup so far with EGD and stool studies is negative. I do not yet see a giardia ag. With re to cause of n, v, d I DO think M avium is reasonable empiric dx. I would however also consider colonoscopy to look for CMV, HSV in colon.  If pts fevers persist I would also condider LP with: Opening pressure documented CSF cryptococcal antigen CSF CMV PCR CSF toxoplasma PCR  I would also consider MRI brain at that point.  Cause of worsening TTpenia is not clear, agree with checking DIC panel.  I will hold off on starting ARVs until his clinical picture stablizes---UNLESS he has HIVAN. I am anxoius to start ARV and we may have to craft a non TNF based regimen given his renal failure. I will check an HLA [id number]  Acey Lav 05/14/2012, 8:58 PM   Donnajean Lopes,  Medical student 05/14/2012, 9:58 AM

## 2012-05-14 NOTE — Progress Notes (Signed)
IM Attending  Continues to have nightly fever spikes.  Says he feels better.  His creatinine has steadily risen since admission.  FENa is low, suggesting either volume contraction or IV Dye toxicity.  We have not tracked his uring output but intake has exceeded 4800 ml each day.   EGD was unrevealing.  On rx for MAC.  Several labs pending.

## 2012-05-15 ENCOUNTER — Inpatient Hospital Stay (HOSPITAL_COMMUNITY): Payer: Medicaid Other

## 2012-05-15 ENCOUNTER — Encounter (HOSPITAL_COMMUNITY)
Admission: EM | Discharge status: Against Medical Advice | Payer: Self-pay | Source: Home / Self Care | Attending: Internal Medicine

## 2012-05-15 ENCOUNTER — Encounter (HOSPITAL_COMMUNITY): Payer: Self-pay | Admitting: Gastroenterology

## 2012-05-15 HISTORY — PX: COLONOSCOPY: SHX5424

## 2012-05-15 LAB — CBC
Hemoglobin: 9.6 g/dL — ABNORMAL LOW (ref 13.0–17.0)
MCH: 27 pg (ref 26.0–34.0)
MCHC: 35.8 g/dL (ref 30.0–36.0)
MCV: 75.5 fL — ABNORMAL LOW (ref 78.0–100.0)
Platelets: 30 10*3/uL — ABNORMAL LOW (ref 150–400)
RBC: 3.55 MIL/uL — ABNORMAL LOW (ref 4.22–5.81)

## 2012-05-15 LAB — TYPE AND SCREEN: ABO/RH(D): O POS

## 2012-05-15 LAB — RENAL FUNCTION PANEL
Albumin: 2.3 g/dL — ABNORMAL LOW (ref 3.5–5.2)
BUN: 52 mg/dL — ABNORMAL HIGH (ref 6–23)
Calcium: 7.5 mg/dL — ABNORMAL LOW (ref 8.4–10.5)
Chloride: 104 mEq/L (ref 96–112)
Creatinine, Ser: 6.3 mg/dL — ABNORMAL HIGH (ref 0.50–1.35)

## 2012-05-15 LAB — ABO/RH: ABO/RH(D): O POS

## 2012-05-15 LAB — IRON AND TIBC
Iron: 28 ug/dL — ABNORMAL LOW (ref 42–135)
Saturation Ratios: 19 % — ABNORMAL LOW (ref 20–55)
UIBC: 121 ug/dL — ABNORMAL LOW (ref 125–400)

## 2012-05-15 LAB — OVA AND PARASITE EXAMINATION

## 2012-05-15 SURGERY — COLONOSCOPY
Anesthesia: Moderate Sedation

## 2012-05-15 MED ORDER — POTASSIUM CHLORIDE 20 MEQ/15ML (10%) PO LIQD: 40.0000 meq | Freq: Once | ORAL | Status: DC

## 2012-05-15 MED ORDER — SODIUM CHLORIDE 0.45 % IV SOLN
INTRAVENOUS | Status: DC
  Administered 2012-05-15 – 2012-05-16 (×3): via INTRAVENOUS
  Filled 2012-05-15 (×10): qty 1000

## 2012-05-15 MED ORDER — MIDAZOLAM HCL 5 MG/ML IJ SOLN
INTRAMUSCULAR | Status: AC
  Filled 2012-05-15: qty 1

## 2012-05-15 MED ORDER — FENTANYL CITRATE 0.05 MG/ML IJ SOLN
INTRAMUSCULAR | Status: DC | PRN
  Administered 2012-05-15 (×3): 25 ug via INTRAVENOUS

## 2012-05-15 MED ORDER — FENTANYL CITRATE 0.05 MG/ML IJ SOLN
INTRAMUSCULAR | Status: AC
  Filled 2012-05-15: qty 2

## 2012-05-15 MED ORDER — POTASSIUM CHLORIDE 20 MEQ/15ML (10%) PO LIQD
40.0000 meq | Freq: Two times a day (BID) | ORAL | Status: DC
  Administered 2012-05-15 (×2): 40 meq via ORAL
  Filled 2012-05-15 (×4): qty 30

## 2012-05-15 MED ORDER — MIDAZOLAM HCL 5 MG/5ML IJ SOLN
INTRAMUSCULAR | Status: DC | PRN
  Administered 2012-05-15: 2 mg via INTRAVENOUS
  Administered 2012-05-15: 1 mg via INTRAVENOUS
  Administered 2012-05-15: 2 mg via INTRAVENOUS

## 2012-05-15 NOTE — Progress Notes (Signed)
Pt back from Endo., very drowsy, no complaints.  Will hold daily medications until pt fully awake.

## 2012-05-15 NOTE — Progress Notes (Signed)
INFECTIOUS DISEASE ATTENDING ADDENDUM:     [facility]   Date: 05/15/2012  Patient name: Zachary Hill  Medical record number: [id number]  Date of birth: [birth year]    This patient has been seen and discussed with the house staff. Please see their note for complete details. I concur with their findings with the following additions/corrections:  Patient vomited again this am. He had clean lower endoscopy as well now. His creatinine continues to rise and platelets continue to drop (he was already TTPenic). DIC had D DImer positive, but high fibrinogen, no schistocytes. LDH normal. Ferritin was high.  None of these clearly put him into category of DIC, TTP/HUS, HLH.  I agree with continuing M AVium therapy, fluconazole.   I would like to start ARV and will use :  Prezista 800mg  daily  Plus  Norvir 100mg  daily  Plus  Isentress 400mg  bid  And lamivudine renally dosed  This way we can avoid nephrotoxicity of Tenofovir, potential bone marrow toxicity of AZT. I will check HLA [id number] to see if we could use abacavir (though his Viral load will undoubtedly be >100K (could use it down the road)  I will also check trofile assay to see if Maraviroc is an option (if CCR5 positive)  There will be risk for Immune Reconstitution SYndrome, which would be an issue if he has an undiagnosed cryptococcal meningitis (but my suspicion for this is not high and he will not agree to LP to rule out crypto meningitis. His crypto serum ag was negative as well)   Acey Lav 05/15/2012, 5:54 PM

## 2012-05-15 NOTE — Brief Op Note (Signed)
Normal colonoscopy except for internal hemorrhoids. S/P biopsies for fungal, viral, and histology. Continue supportive care.

## 2012-05-15 NOTE — Progress Notes (Signed)
Endo team up to take pt down for colonscopy.  Condom cath off and will replace when pt returns from Endo. Pt with no complaints.

## 2012-05-15 NOTE — Op Note (Signed)
[facility] [address] [place], [place]  [address]  COLONOSCOPY PROCEDURE REPORT  PATIENT:  Zachary Hill, Zachary Hill  MR#:  [id number] BIRTHDATE:  1981/07/03, 30 yrs. old  GENDER:  male ENDOSCOPIST:  Charlott Rakes, MD REF. BY:   hospital team PROCEDURE DATE:  05/15/2012 PROCEDURE:  Colonoscopy with biopsy ASA CLASS:  INDICATIONS:  Class IV  Diarrhea MEDICATIONS:  Fentanyl 75 mcg IV, Versed 5 mg IV  DESCRIPTION OF PROCEDURE:   After the risks benefits and alternatives of the procedure were thoroughly explained, informed consent was obtained.  The Pentax Ped Colon [id number] endoscope was introduced through the anus and advanced to the cecum where the ileocecal valve and appendiceal orifice were identified, without limitations.  The quality of the prep was fair.  The instrument was then slowly withdrawn as the colon was fully examined. <<PROCEDUREIMAGES>>  FINDINGS:  Rectal exam unremarkable.  Pediatric colonoscope inserted into the colon and advanced to the cecum, where the appendiceal orifice and ileocecal valve were identified.    On careful withdrawal of the colonoscope the colonic mucosa was normal in appearance biopsies were taken to send for fungal and viral cultures as well as histology. Retroflexion revealed small internal hemorrhoids.  COMPLICATIONS:  None  IMPRESSION: Small internal hemorrhoids otherwise normal appearing colonic mucosa status post multiple biopsies  RECOMMENDATIONS: F/U on path; Continue supportive care  ______________________________ Charlott Rakes, MD  CC:  n. eSIGNEDCharlott Rakes at 05/15/2012 03:34 PM  Ovidio Hanger, [phone]

## 2012-05-15 NOTE — Progress Notes (Signed)
Nephrology Service Daily Progress Note  Subjective:  The patient had a UOP of 950 overnight, improved from yesterday, though patient's Cr continues to rise.  The patient remains acidotic despite bicarb drip.  He notes less nausea and vomiting overnight, still with occasional loose stools, though c diff negative.  Vancomycin level today is down to 20.2.  Objective Vital signs in last 24 hours: Filed Vitals:   05/14/12 2349 05/15/12 0020 05/15/12 0120 05/15/12 0400  BP: 115/61   106/59  Pulse: 114 116 125 118  Temp: 101.1 F (38.4 C) 101.1 F (38.4 C) 100.5 F (38.1 C) 99 F (37.2 C)  TempSrc: Oral Oral Oral Oral  Resp: 34 36 33 24  Height:      Weight:      SpO2: 100% 99% 98% 97%   Weight change:   Intake/Output Summary (Last 24 hours) at 05/15/12 0738 Last data filed at 05/15/12 0600  Gross per 24 hour  Intake   2450 ml  Output   2650 ml  Net   -200 ml  UOP: 950  General: sitting in bed, appears uncomfortable HEENT: +bilateral conjunctival injection noted, pupils equal round and reactive to light, vision grossly intact, oropharynx clear and non-erythematous  Neck: supple, no lymphadenopathy Lungs: clear to ascultation bilaterally, normal work of respiration, no wheezes, rales, ronchi Heart: regular rate and rhythm, no murmurs, gallops, or rubs Abdomen: soft, non-tender, non-distended, normal bowel sounds  Extremities: no cyanosis, clubbing, or edema Neurologic: alert & oriented X3, cranial nerves II-XII intact, strength grossly intact, sensation intact to light touch, patient is right-handed   Labs: Basic Metabolic Panel:  Lab 05/15/12 1914 05/14/12 0530 05/13/12 1918 05/13/12 1000 05/12/12 0523 05/11/12 1159  NA 135 138 137 134* 133* 134*  K 3.1* 2.9* 3.2* 3.1* 3.4* 3.6  CL 104 107 103 102 102 100  CO2 14* 15* 17* 14* 20 20  GLUCOSE 104* 90 97 104* 123* 104*  BUN 52* 43* 33* 23 12 12   CREATININE 6.30* 4.95* 3.99* 3.25* 1.14 0.91  ALB -- -- -- -- -- --  CALCIUM  7.5* 7.7* 8.1* 8.2* 8.8 9.5  PHOS 3.0 3.1 -- -- -- --   Liver Function Tests:  Lab 05/15/12 0425 05/14/12 0530 05/12/12 0523 05/11/12 1159  AST -- 77* 77* 85*  ALT -- 67* 120* 138*  ALKPHOS -- 133* 256* 293*  BILITOT -- 0.9 1.7* 1.3*  PROT -- 6.5 7.1 8.4*  ALBUMIN 2.3* 2.5* 3.2* --    Lab 05/11/12 1347  LIPASE 52  AMYLASE --   CBC:  Lab 05/15/12 0425 05/14/12 0954 05/14/12 0530 05/13/12 1000 05/12/12 0523 05/11/12 1159  WBC 6.1 -- 8.3 14.8* 8.9 --  NEUTROABS -- -- 7.6 -- -- 8.2*  HGB 9.6* -- 10.8* 12.0* 11.8* --  HCT 26.8* -- 30.8* 35.1* 35.4* --  MCV 75.5* -- 77.2* 79.1 80.1 --  PLT 30* 34* 43* 83* -- --   PT/INR:  Lab 05/14/12 0954  INR 1.38    Iron Studies:  Lab 05/12/12 1234  IRON --  TIBC --  TRANSFERRIN --  FERRITIN 1334*   Medications: Scheduled Meds:   . azithromycin  600 mg Oral Daily  . dapsone  100 mg Oral Daily  . ethambutol  15 mg/kg Oral Daily  . fluconazole (DIFLUCAN) IV  100 mg Intravenous Q24H  . pantoprazole (PROTONIX) IV  40 mg Intravenous Once  . senna  1 tablet Oral BID  . DISCONTD: cefTRIAXone (ROCEPHIN)  IV  2 g  Intravenous Q24H  . DISCONTD: enoxaparin (LOVENOX) injection  30 mg Subcutaneous Q24H  . DISCONTD: metronidazole  500 mg Intravenous Q8H   Continuous Infusions:   . sodium chloride 0.45 % 1,000 mL with potassium chloride 40 mEq, sodium bicarbonate 75 mEq infusion 200 mL/hr at 05/14/12 2346  . DISCONTD: 0.9 % NaCl with KCl 40 mEq / L 200 mL/hr at 05/14/12 1600   PRN Meds:.acetaminophen, acetaminophen, ibuprofen, ondansetron (ZOFRAN) IV, ondansetron, promethazine  I  have reviewed scheduled and prn medications.  Assessment/Plan:  The patient is a 31 yo man, history of HIV (CD4 = 10, VL = 475K, 03/21/12), prior HBV (sAg neg, sAb pos, cAb pos, 03/21/12), presenting 8/10 with nausea, vomiting, SOB, and fevers, consulted for acute renal failure.   # Acute Renal Failure - the patient presents with acute renal failure, likely  initially representing contrast-induced nephropathy, complicated by vancomycin-induced nephropathy. Other potential etiologies include AIN (though no rash, no peripheral eosinophilia). Less likely HUS (no schistocytes on smear, normal LDH on 8/11, but drop in Hb). No evidence of post-renal obstruction on abd [place] or CT.  -agree with discontinuing vanc, per primary team  -vanc levels downtrending -continue IVF at 200 cc/hr with bicarb and K -consider adding PO bicarb  # Anemia - drop in Hb from 13.2 to 10.8 during admission, may represent initial hemoconcentration (given poor PO intake, nausea/vomiting prior to admission). Ferritin = 1334 on 05/12/12.  -pending TIBC, saturation ratio   # Hypokalemia - likely secondary to GI losses  -currently being repleted by primary team   # Thrombocytopenia - drop in platelets from 98 on admission to 34 today. Per HIT score, pt has low probability of HIT (score = 3/8). Other possibilities include HIV.  -DIC panel negative   Janalyn Harder, PGY2 Pgr. [phone] 05/15/2012, 7:38 AM  Mr Innis still has diarrhea and fever.  Stool c. diff is neg, but other stool studies are pending.  He's getting IVF at 200/hr. Urine output has increased, but Cr continues to climb.  Will give po plus IV KCl.

## 2012-05-15 NOTE — Interval H&P Note (Signed)
History and Physical Interval Note:  05/15/2012 2:48 PM  Zachary Hill  has presented today for surgery, with the diagnosis of Diarrhea  The various methods of treatment have been discussed with the patient and family. After consideration of risks, benefits and other options for treatment, the patient has consented to  Procedure(s) (LRB): COLONOSCOPY (N/A) as a surgical intervention .  The patient's history has been reviewed, patient examined, no change in status, stable for surgery.  I have reviewed the patient's chart and labs.  Questions were answered to the patient's satisfaction.     Markeeta Scalf C.

## 2012-05-15 NOTE — Progress Notes (Signed)
In to replace condom cath, pt still drowsy refuse cath at this time.  States," I don't want it now, come back later".  Will check back with pt to have cath placed.

## 2012-05-15 NOTE — Progress Notes (Signed)
IM Attending  Creatinine continues to rise.  FENa was low so IV dye is most likely cause.  Vanc tox may contribute.  Appreciate renal consult.   Continues to spike fevers.  We are pursuing every lead suggested by ID and continuing to treat possible MAC.

## 2012-05-15 NOTE — H&P (View-Only) (Signed)
Subjective: He had some French fries yesterday without vomiting but has not been able to tolerate broth since then. He continues to be nauseated and had emesis again this morning. He continues to have a cough productive of green sputum and blood smear.  He denies headache, confusion, neck stiffness or pain, chest pain, or blood in his urine or stool.  Objective: Vital signs in last 24 hours: Filed Vitals:   05/15/12 0020 05/15/12 0120 05/15/12 0400 05/15/12 0738  BP:   106/59   Pulse: 116 125 118   Temp: 101.1 F (38.4 C) 100.5 F (38.1 C) 99 F (37.2 C) 99.3 F (37.4 C)  TempSrc: Oral Oral Oral Oral  Resp: 36 33 24   Height:      Weight:      SpO2: 99% 98% 97%    Weight change:   Intake/Output Summary (Last 24 hours) at 05/15/12 0825 Last data filed at 05/15/12 0740  Gross per 24 hour  Intake   2450 ml  Output   2675 ml  Net   -225 ml  Vitals reviewed.  General: resting in bed, tired appearing, in NAD  HEENT: bilateral conjunctivitis with no eye discharge, petechial pharyngitis with less erythema, minimum thrush plaque on his tongue. No cervical or axillary LAD.  Cardiac: tachycardic, no rubs, murmurs or gallops  Pulm: bibasilar lung crackles, no wheezes, rales, or rhonchi  Abd: soft, mid epigastric tenderness, nondistended, BS present  Ext: warm and well perfused, no pedal edema, plantar or palmar rash. No rash noted on his abdomen, back, chest, UE or LE.  Neuro: alert and oriented X3, cranial nerves II-XII grossly intact, strength and sensation to light touch equal in bilateral upper and lower extremities    Lab Results: Basic Metabolic Panel:  Lab 05/15/12 0425 05/14/12 0530  NA 135 138  K 3.1* 2.9*  CL 104 107  CO2 14* 15*  GLUCOSE 104* 90  BUN 52* 43*  CREATININE 6.30* 4.95*  CALCIUM 7.5* 7.7*  MG -- --  PHOS 3.0 3.1   Liver Function Tests:  Lab 05/15/12 0425 05/14/12 0530 05/12/12 0523  AST -- 77* 77*  ALT -- 67* 120*  ALKPHOS -- 133* 256*  BILITOT  -- 0.9 1.7*  PROT -- 6.5 7.1  ALBUMIN 2.3* 2.5* --    Lab 05/11/12 1347  LIPASE 52  AMYLASE --   CBC:  Lab 05/15/12 0425 05/14/12 0954 05/14/12 0530 05/11/12 1159  WBC 6.1 -- 8.3 --  NEUTROABS -- -- 7.6 8.2*  HGB 9.6* -- 10.8* --  HCT 26.8* -- 30.8* --  MCV 75.5* -- 77.2* --  PLT 30* 34* -- --   D-Dimer:  Lab 05/14/12 0954  DDIMER 7.59*   Fasting Lipid Panel:  Lab 05/12/12 1932  CHOL 141  HDL 13*  LDLCALC 85  TRIG 217*  CHOLHDL 10.8  LDLDIRECT --   Coagulation:  Lab 05/14/12 0954  LABPROT 17.2*  INR 1.38   Anemia Panel:  Lab 05/12/12 1234  VITAMINB12 --  FOLATE --  FERRITIN 1334*  TIBC --  IRON --  RETICCTPCT --   Urinalysis:  Lab 05/13/12 2249 05/11/12 1218  COLORURINE ORANGE* ORANGE*  LABSPEC 1.027 1.029  PHURINE 5.0 6.0  GLUCOSEU NEGATIVE NEGATIVE  HGBUR LARGE* NEGATIVE  BILIRUBINUR SMALL* SMALL*  KETONESUR 15* 15*  PROTEINUR 100* 100*  UROBILINOGEN 0.2 4.0*  NITRITE POSITIVE* NEGATIVE  LEUKOCYTESUR MODERATE* TRACE*    Micro Results: Recent Results (from the past 240 hour(s))  URINE CULTURE       Status: Normal   Collection Time   05/11/12 12:18 PM      Component Value Range Status Comment   Specimen Description URINE, CATHETERIZED   Final    Special Requests NONE   Final    Culture  Setup Time 05/11/2012 22:56   Final    Colony Count NO GROWTH   Final    Culture NO GROWTH   Final    Report Status 05/12/2012 FINAL   Final   CULTURE, BLOOD (ROUTINE X 2)     Status: Normal (Preliminary result)   Collection Time   05/11/12  4:10 PM      Component Value Range Status Comment   Specimen Description BLOOD LEFT ARM   Final    Special Requests BOTTLES DRAWN AEROBIC AND ANAEROBIC 10CC   Final    Culture  Setup Time 05/11/2012 22:55   Final    Culture     Final    Value:        BLOOD CULTURE RECEIVED NO GROWTH TO DATE CULTURE WILL BE HELD FOR 5 DAYS BEFORE ISSUING A FINAL NEGATIVE REPORT   Report Status PENDING   Incomplete   CULTURE, BLOOD  (ROUTINE X 2)     Status: Normal (Preliminary result)   Collection Time   05/11/12  4:25 PM      Component Value Range Status Comment   Specimen Description BLOOD LEFT HAND   Final    Special Requests BOTTLES DRAWN AEROBIC AND ANAEROBIC 10CC   Final    Culture  Setup Time 05/11/2012 22:55   Final    Culture     Final    Value:        BLOOD CULTURE RECEIVED NO GROWTH TO DATE CULTURE WILL BE HELD FOR 5 DAYS BEFORE ISSUING A FINAL NEGATIVE REPORT   Report Status PENDING   Incomplete   AFB CULTURE, BLOOD     Status: Normal (Preliminary result)   Collection Time   05/12/12 12:33 PM      Component Value Range Status Comment   Specimen Description BLOOD LEFT ARM   Final    Special Requests BOTTLES DRAWN AEROBIC ONLY 3CC   Final    Culture     Final    Value: CULTURE WILL BE EXAMINED FOR 6 WEEKS BEFORE ISSUING A FINAL REPORT   Report Status PENDING   Incomplete   MRSA PCR SCREENING     Status: Normal   Collection Time   05/12/12  3:35 PM      Component Value Range Status Comment   MRSA by PCR NEGATIVE  NEGATIVE Final   CULTURE, EXPECTORATED SPUTUM-ASSESSMENT     Status: Normal   Collection Time   05/12/12  5:04 PM      Component Value Range Status Comment   Specimen Description SPUTUM   Final    Special Requests NONE   Final    Sputum evaluation     Final    Value: MICROSCOPIC FINDINGS SUGGEST THAT THIS SPECIMEN IS NOT REPRESENTATIVE OF LOWER RESPIRATORY SECRETIONS. PLEASE RECOLLECT.     CALLED TO MATTHEWS,M RN 05/12/12 1745 WOOTEN,K   Report Status 05/12/2012 FINAL   Final   STOOL CULTURE     Status: Normal (Preliminary result)   Collection Time   05/12/12  7:01 PM      Component Value Range Status Comment   Specimen Description STOOL   Final    Special Requests Immunocompromised   Final    Culture Culture reincubated for   better growth   Final    Report Status PENDING   Incomplete   CRYPTOSPORIDIUM SMEAR, FECAL     Status: Normal   Collection Time   05/12/12  7:01 PM      Component  Value Range Status Comment   Specimen Description STOOL   Final    Special Requests NONE   Final    Cryptosporidium Smear. NO Cryptosporidium Cyclospora or Isospora seen.   Final    Report Status 05/14/2012 FINAL   Final   AFB CULTURE, BLOOD     Status: Normal (Preliminary result)   Collection Time   05/13/12  9:35 AM      Component Value Range Status Comment   Specimen Description BLOOD LEFT ARM   Final    Special Requests 5CC   Final    Culture     Final    Value: CULTURE WILL BE EXAMINED FOR 6 WEEKS BEFORE ISSUING A FINAL REPORT   Report Status PENDING   Incomplete   CULTURE, EXPECTORATED SPUTUM-ASSESSMENT     Status: Normal   Collection Time   05/13/12 11:03 AM      Component Value Range Status Comment   Specimen Description SPUTUM   Final    Special Requests Immunocompromised   Final    Sputum evaluation     Final    Value: MICROSCOPIC FINDINGS SUGGEST THAT THIS SPECIMEN IS NOT REPRESENTATIVE OF LOWER RESPIRATORY SECRETIONS. PLEASE RECOLLECT.     Gram Stain Report Called to,Read Back By and Verified With: H. Matthews RN 12:05 05/13/12 ([email or web address])   Report Status 05/13/2012 FINAL   Final   FUNGUS CULTURE W SMEAR     Status: Normal (Preliminary result)   Collection Time   05/13/12  4:41 PM      Component Value Range Status Comment   Specimen Description TISSUE ESOPHAGUS   Final    Special Requests NONE   Final    Fungal Smear NO YEAST OR FUNGAL ELEMENTS SEEN   Final    Culture CULTURE IN PROGRESS FOR FOUR WEEKS   Final    Report Status PENDING   Incomplete   CLOSTRIDIUM DIFFICILE BY PCR     Status: Normal   Collection Time   05/14/12  3:53 PM      Component Value Range Status Comment   C difficile by pcr NEGATIVE  NEGATIVE Final    Studies/Results: No results found. Medications: I have reviewed the patient's current medications. Scheduled Meds:   . azithromycin  600 mg Oral Daily  . dapsone  100 mg Oral Daily  . ethambutol  15 mg/kg Oral Daily  . fluconazole (DIFLUCAN) IV   100 mg Intravenous Q24H  . pantoprazole (PROTONIX) IV  40 mg Intravenous Once  . senna  1 tablet Oral BID  . DISCONTD: metronidazole  500 mg Intravenous Q8H   Continuous Infusions:   . sodium chloride 0.45 % 1,000 mL with potassium chloride 40 mEq, sodium bicarbonate 75 mEq infusion 200 mL/hr at 05/14/12 2346  . DISCONTD: 0.9 % NaCl with KCl 40 mEq / L 200 mL/hr at 05/14/12 1600   PRN Meds:.acetaminophen, acetaminophen, ibuprofen, ondansetron (ZOFRAN) IV, ondansetron, promethazine Assessment/Plan: 30 man with HIV now AIDS, off ART for a year and now with CD4 < 10. Back with Dr. Snider and on dapsone, azithromycin, and diflucan pending ADAP to resume ART. Adm for tepm of 103 w/o localizing site of infection. Has thrush but on rx for > a week.   1. SIRS/ Fever:   Given CD4 count 10 concern for sepsis from MAC versus bacteremia is valid. No acute evidence for pneumonia even with repeat CXR in AM after fluids to rule out PCP. Urine unremarkable. Blood cultures times 2 drawn in ED. Vanc and Zosyn started on eve of 8/10. Pt with persistent fever, tachycardia, tacpnea on 8/11 AM. New headache on admission but no signs or symptoms concerning for meningitis or encephalitis. Pt also with pharyngitis x4 days, N/V/D x2 days. Dr. Snider following closely. Pt with low threshold for LP for CSF analysis. Transfered to stepdown. Required 2 NS 1L boluses in last 24 hours. Repeat CXR unchanged. Repeat CT head without contrast negative for acute changes, CT abdomen without contrast showing mild bowel inflammation with reactive lymph nodes. Zosyn stopped on eve of 8/11, pt started on Rocephin and flagyl IV that night. Pt currently also on dapsone 100mg daily for PCP prophylaxis, Ethambutol for MAC empiric tx, fluconazole 200mg IV for prophylaxis but now decreased to 100mg 2/2 AKI. Discontinued Vanc 2/2 AKI. WBC up from 8.9 to 14.8 yesterday but down to 8.3 today. Pt afebrile yesterday with temp of 99F, then febrile again  with Tmax of 102F at 7PM. Remains febrile in last 24 hours with but with temp as high as 103F, will continue to monitor.  - CXR repeat today - Discontinued flagyl yesterday - LP with CSF analysis, cultures if pt not improving tomorrow  - Consider MRI head tomorrow  - Given immunosupression will continue broad spectrum but discontinue vancomycin 2/2 AKI  - Follow up on cultures  - LDH normal  -Toxoplasma antibody: negative  -CMV culture: pending  -AFB culture: pending  -Blood cultures: pending  -Herpes simplex cx: pending  -Fungal culture with smear: pending  -Respiratory culture: not of lower respiratory airway  -Stool culture: pending  -Urine culture: NGTD  -Ova and parasite: pending  - C diff stool PCR: negative  2. Nausea/vomiting: Concern for bacterial vs viral illness given weakened immune system. CXR unremarkable and US abdomen unremarkable. Pt with reported vomiting overnight. EGD unremarkable for CMV, HSV, candida esophagitis but viral cultures pending.  - GI consult for colonoscopy with biopsy for CMV/HSV, though will need platelets before colonoscopy - Zofran for nausea  - NS @ 200 cc/hr with KCl and bicarbonate overnight for some volume loss  - Full diet if tolerated   3. Acute kidney injury. Pt with creatinine up from ~1.4 to 3.99-->4.85. Vanc level 41. UA dirty, with granular casts but FeNa 0.6%. This could be 2/2 Vanc superimposed on mild prerenal azotemia 2/2 emesis, decreased PO v. IV dye contrast toxicity. Vancomycin now stopped. Decreased Lovenox to 30mg SQ (Cr Cl <30). Decreased fluconazole to 100mg q24h, (Cr Cl <50). Pt now s/p NS 3L boluses yesterday and hydration with NS at 200ml/h. Total input ~4.5L on 8/12. IVF fluids at 200cc/h , total output not fully recorded 2/2 pt voiding with diarrhea. Pt oliguric since condom cath recording, per pt his urine output has been decreased.  -Continue 1/2 NS at 200ml/h with KCL 40mEQ/L and 75mEQ bicarb (per renal)  -Continue  trending Creatinine  -Renal consulted, appreciate recommendations  -Condom cath for urine output  -Renal US, ordered  4. Thrombocytopenia. Pt with platelets at 98 on admission, trended down to 83, now 43-->30 today. Hg down but not remarkably, could be 2/2 dilution. Concern for DIC thought could also be explained by AIDS and sepsis. Pt with no blood per rectum, has had sputum with blood smear.   -DIC panel with   no schistocytes, elevated D-dimer but elevated fibrinogen.  -HIT score 3/8 -Platelets 1 unit stat  5. Pharyngitis. Improving. Pt with pharyngitis >4 days on admission. Petechial pharyngitis with no exudates, no LAD. GI consulted for EGD. Preliminary report with no evidence for CMV or HSV esophagitis. Sore throat improving.  -Will follow up on viral and fungal cultures.   6. Diarrhea. Pt with at least 4 days of diarrhea. CT abdomen showing reactive abdominal lymph nodes. Sent stool for giardia, cryptosporidium/microsporidium.  - Will have an nonprepped colonoscopy today -C. Diff PCR negative.   7. AIDS: CD4 last 10, has been off ARVs for some time now and is in process of getting back on medications. No concern for IRIS as he has not started meds yet. Case manager to assist pt with emergency Medicaid for anti-retrovirals. Pt states that only his pastor knows of his HIV status, will continue caution.  - PPX with dapsone and azithromycin  - Tx for possible candidal esophagitis with fluconazole for 2 weeks started 7/30  - Per ID: emergency medicaid  - Will avoid AART now, especially truvada 2/2 nephrotoxicity  8. Pruritis: No rashes see on physical exam. Sx could be from Dapsone. Will continue to monitor.   9. DVT ppx: SCDs. Discontinued Lovenox 2/2 decreased platelets. (From 83 to 43)    LOS: 4 days   Jaclyn Carew D 05/15/2012, 8:25 AM 

## 2012-05-15 NOTE — Progress Notes (Signed)
Subjective: He had some [place] fries yesterday without vomiting but has not been able to tolerate broth since then. He continues to be nauseated and had emesis again this morning. He continues to have a cough productive of green sputum and blood smear.  He denies headache, confusion, neck stiffness or pain, chest pain, or blood in his urine or stool.  Objective: Vital signs in last 24 hours: Filed Vitals:   05/15/12 0020 05/15/12 0120 05/15/12 0400 05/15/12 0738  BP:   106/59   Pulse: 116 125 118   Temp: 101.1 F (38.4 C) 100.5 F (38.1 C) 99 F (37.2 C) 99.3 F (37.4 C)  TempSrc: Oral Oral Oral Oral  Resp: 36 33 24   Height:      Weight:      SpO2: 99% 98% 97%    Weight change:   Intake/Output Summary (Last 24 hours) at 05/15/12 0825 Last data filed at 05/15/12 0740  Gross per 24 hour  Intake   2450 ml  Output   2675 ml  Net   -225 ml  Vitals reviewed.  General: resting in bed, tired appearing, in NAD  HEENT: bilateral conjunctivitis with no eye discharge, petechial pharyngitis with less erythema, minimum thrush plaque on his tongue. No cervical or axillary LAD.  Cardiac: tachycardic, no rubs, murmurs or gallops  Pulm: bibasilar lung crackles, no wheezes, rales, or rhonchi  Abd: soft, mid epigastric tenderness, nondistended, BS present  Ext: warm and well perfused, no pedal edema, plantar or palmar rash. No rash noted on his abdomen, back, chest, UE or LE.  Neuro: alert and oriented X3, cranial nerves II-XII grossly intact, strength and sensation to light touch equal in bilateral upper and lower extremities    Lab Results: Basic Metabolic Panel:  Lab 05/15/12 1914 05/14/12 0530  NA 135 138  K 3.1* 2.9*  CL 104 107  CO2 14* 15*  GLUCOSE 104* 90  BUN 52* 43*  CREATININE 6.30* 4.95*  CALCIUM 7.5* 7.7*  MG -- --  PHOS 3.0 3.1   Liver Function Tests:  Lab 05/15/12 0425 05/14/12 0530 05/12/12 0523  AST -- 77* 77*  ALT -- 67* 120*  ALKPHOS -- 133* 256*  BILITOT  -- 0.9 1.7*  PROT -- 6.5 7.1  ALBUMIN 2.3* 2.5* --    Lab 05/11/12 1347  LIPASE 52  AMYLASE --   CBC:  Lab 05/15/12 0425 05/14/12 0954 05/14/12 0530 05/11/12 1159  WBC 6.1 -- 8.3 --  NEUTROABS -- -- 7.6 8.2*  HGB 9.6* -- 10.8* --  HCT 26.8* -- 30.8* --  MCV 75.5* -- 77.2* --  PLT 30* 34* -- --   D-Dimer:  Lab 05/14/12 0954  DDIMER 7.59*   Fasting Lipid Panel:  Lab 05/12/12 1932  CHOL 141  HDL 13*  LDLCALC 85  TRIG 782*  CHOLHDL 10.8  LDLDIRECT --   Coagulation:  Lab 05/14/12 0954  LABPROT 17.2*  INR 1.38   Anemia Panel:  Lab 05/12/12 1234  VITAMINB12 --  FOLATE --  FERRITIN 1334*  TIBC --  IRON --  RETICCTPCT --   Urinalysis:  Lab 05/13/12 2249 05/11/12 1218  COLORURINE ORANGE* ORANGE*  LABSPEC 1.027 1.029  PHURINE 5.0 6.0  GLUCOSEU NEGATIVE NEGATIVE  HGBUR LARGE* NEGATIVE  BILIRUBINUR SMALL* SMALL*  KETONESUR 15* 15*  PROTEINUR 100* 100*  UROBILINOGEN 0.2 4.0*  NITRITE POSITIVE* NEGATIVE  LEUKOCYTESUR MODERATE* TRACE*    Micro Results: Recent Results (from the past 240 hour(s))  URINE CULTURE  Status: Normal   Collection Time   05/11/12 12:18 PM      Component Value Range Status Comment   Specimen Description URINE, CATHETERIZED   Final    Special Requests NONE   Final    Culture  Setup Time 05/11/2012 22:56   Final    Colony Count NO GROWTH   Final    Culture NO GROWTH   Final    Report Status 05/12/2012 FINAL   Final   CULTURE, BLOOD (ROUTINE X 2)     Status: Normal (Preliminary result)   Collection Time   05/11/12  4:10 PM      Component Value Range Status Comment   Specimen Description BLOOD LEFT ARM   Final    Special Requests BOTTLES DRAWN AEROBIC AND ANAEROBIC 10CC   Final    Culture  Setup Time 05/11/2012 22:55   Final    Culture     Final    Value:        BLOOD CULTURE RECEIVED NO GROWTH TO DATE CULTURE WILL BE HELD FOR 5 DAYS BEFORE ISSUING A FINAL NEGATIVE REPORT   Report Status PENDING   Incomplete   CULTURE, BLOOD  (ROUTINE X 2)     Status: Normal (Preliminary result)   Collection Time   05/11/12  4:25 PM      Component Value Range Status Comment   Specimen Description BLOOD LEFT HAND   Final    Special Requests BOTTLES DRAWN AEROBIC AND ANAEROBIC 10CC   Final    Culture  Setup Time 05/11/2012 22:55   Final    Culture     Final    Value:        BLOOD CULTURE RECEIVED NO GROWTH TO DATE CULTURE WILL BE HELD FOR 5 DAYS BEFORE ISSUING A FINAL NEGATIVE REPORT   Report Status PENDING   Incomplete   AFB CULTURE, BLOOD     Status: Normal (Preliminary result)   Collection Time   05/12/12 12:33 PM      Component Value Range Status Comment   Specimen Description BLOOD LEFT ARM   Final    Special Requests BOTTLES DRAWN AEROBIC ONLY 3CC   Final    Culture     Final    Value: CULTURE WILL BE EXAMINED FOR 6 WEEKS BEFORE ISSUING A FINAL REPORT   Report Status PENDING   Incomplete   MRSA PCR SCREENING     Status: Normal   Collection Time   05/12/12  3:35 PM      Component Value Range Status Comment   MRSA by PCR NEGATIVE  NEGATIVE Final   CULTURE, EXPECTORATED SPUTUM-ASSESSMENT     Status: Normal   Collection Time   05/12/12  5:04 PM      Component Value Range Status Comment   Specimen Description SPUTUM   Final    Special Requests NONE   Final    Sputum evaluation     Final    Value: MICROSCOPIC FINDINGS SUGGEST THAT THIS SPECIMEN IS NOT REPRESENTATIVE OF LOWER RESPIRATORY SECRETIONS. PLEASE RECOLLECT.     CALLED TO MATTHEWS,M RN 05/12/12 1745 WOOTEN,K   Report Status 05/12/2012 FINAL   Final   STOOL CULTURE     Status: Normal (Preliminary result)   Collection Time   05/12/12  7:01 PM      Component Value Range Status Comment   Specimen Description STOOL   Final    Special Requests Immunocompromised   Final    Culture Culture reincubated for  better growth   Final    Report Status PENDING   Incomplete   CRYPTOSPORIDIUM SMEAR, FECAL     Status: Normal   Collection Time   05/12/12  7:01 PM      Component  Value Range Status Comment   Specimen Description STOOL   Final    Special Requests NONE   Final    Cryptosporidium Smear. NO Cryptosporidium Cyclospora or Isospora seen.   Final    Report Status 05/14/2012 FINAL   Final   AFB CULTURE, BLOOD     Status: Normal (Preliminary result)   Collection Time   05/13/12  9:35 AM      Component Value Range Status Comment   Specimen Description BLOOD LEFT ARM   Final    Special Requests 5CC   Final    Culture     Final    Value: CULTURE WILL BE EXAMINED FOR 6 WEEKS BEFORE ISSUING A FINAL REPORT   Report Status PENDING   Incomplete   CULTURE, EXPECTORATED SPUTUM-ASSESSMENT     Status: Normal   Collection Time   05/13/12 11:03 AM      Component Value Range Status Comment   Specimen Description SPUTUM   Final    Special Requests Immunocompromised   Final    Sputum evaluation     Final    Value: MICROSCOPIC FINDINGS SUGGEST THAT THIS SPECIMEN IS NOT REPRESENTATIVE OF LOWER RESPIRATORY SECRETIONS. PLEASE RECOLLECT.     Gram Stain Report Called to,Read Back By and Verified With: Ernestina Penna RN 12:05 05/13/12 ([email or web address])   Report Status 05/13/2012 FINAL   Final   FUNGUS CULTURE W SMEAR     Status: Normal (Preliminary result)   Collection Time   05/13/12  4:41 PM      Component Value Range Status Comment   Specimen Description TISSUE ESOPHAGUS   Final    Special Requests NONE   Final    Fungal Smear NO YEAST OR FUNGAL ELEMENTS SEEN   Final    Culture CULTURE IN PROGRESS FOR FOUR WEEKS   Final    Report Status PENDING   Incomplete   CLOSTRIDIUM DIFFICILE BY PCR     Status: Normal   Collection Time   05/14/12  3:53 PM      Component Value Range Status Comment   C difficile by pcr NEGATIVE  NEGATIVE Final    Studies/Results: No results found. Medications: I have reviewed the patient's current medications. Scheduled Meds:   . azithromycin  600 mg Oral Daily  . dapsone  100 mg Oral Daily  . ethambutol  15 mg/kg Oral Daily  . fluconazole (DIFLUCAN) IV   100 mg Intravenous Q24H  . pantoprazole (PROTONIX) IV  40 mg Intravenous Once  . senna  1 tablet Oral BID  . DISCONTD: metronidazole  500 mg Intravenous Q8H   Continuous Infusions:   . sodium chloride 0.45 % 1,000 mL with potassium chloride 40 mEq, sodium bicarbonate 75 mEq infusion 200 mL/hr at 05/14/12 2346  . DISCONTD: 0.9 % NaCl with KCl 40 mEq / L 200 mL/hr at 05/14/12 1600   PRN Meds:.acetaminophen, acetaminophen, ibuprofen, ondansetron (ZOFRAN) IV, ondansetron, promethazine Assessment/Plan: 45 man with HIV now AIDS, off ART for a year and now with CD4 < 10. Back with Dr. Drue Second and on dapsone, azithromycin, and diflucan pending ADAP to resume ART. Adm for tepm of 103 w/o localizing site of infection. Has thrush but on rx for > a week.   1. SIRS/ Fever:  Given CD4 count 10 concern for sepsis from MAC versus bacteremia is valid. No acute evidence for pneumonia even with repeat CXR in AM after fluids to rule out PCP. Urine unremarkable. Blood cultures times 2 drawn in ED. Vanc and Zosyn started on eve of 8/10. Pt with persistent fever, tachycardia, tacpnea on 8/11 AM. New headache on admission but no signs or symptoms concerning for meningitis or encephalitis. Pt also with pharyngitis x4 days, N/V/D x2 days. Dr. Drue Second following closely. Pt with low threshold for LP for CSF analysis. Transfered to stepdown. Required 2 NS 1L boluses in last 24 hours. Repeat CXR unchanged. Repeat CT head without contrast negative for acute changes, CT abdomen without contrast showing mild bowel inflammation with reactive lymph nodes. Zosyn stopped on eve of 8/11, pt started on Rocephin and flagyl IV that night. Pt currently also on dapsone 100mg  daily for PCP prophylaxis, Ethambutol for MAC empiric tx, fluconazole 200mg  IV for prophylaxis but now decreased to 100mg  2/2 AKI. Discontinued Vanc 2/2 AKI. WBC up from 8.9 to 14.8 yesterday but down to 8.3 today. Pt afebrile yesterday with temp of 68F, then febrile again  with Tmax of 102F at 7PM. Remains febrile in last 24 hours with but with temp as high as 103F, will continue to monitor.  - CXR repeat today - Discontinued flagyl yesterday - LP with CSF analysis, cultures if pt not improving tomorrow  - Consider MRI head tomorrow  - Given immunosupression will continue broad spectrum but discontinue vancomycin 2/2 AKI  - Follow up on cultures  - LDH normal  -Toxoplasma antibody: negative  -CMV culture: pending  -AFB culture: pending  -Blood cultures: pending  -Herpes simplex cx: pending  -Fungal culture with smear: pending  -Respiratory culture: not of lower respiratory airway  -Stool culture: pending  -Urine culture: NGTD  -Ova and parasite: pending  - C diff stool PCR: negative  2. Nausea/vomiting: Concern for bacterial vs viral illness given weakened immune system. CXR unremarkable and US abdomen unremarkable. Pt with reported vomiting overnight. EGD unremarkable for CMV, HSV, candida esophagitis but viral cultures pending.  - GI consult for colonoscopy with biopsy for CMV/HSV, though will need platelets before colonoscopy - Zofran for nausea  - NS @ 200 cc/hr with KCl and bicarbonate overnight for some volume loss  - Full diet if tolerated   3. Acute kidney injury. Pt with creatinine up from ~1.4 to 3.99-->4.85. Vanc level 41. UA dirty, with granular casts but FeNa 0.6%. This could be 2/2 Vanc superimposed on mild prerenal azotemia 2/2 emesis, decreased PO v. IV dye contrast toxicity. Vancomycin now stopped. Decreased Lovenox to 30mg  SQ (Cr Cl <30). Decreased fluconazole to 100mg  q24h, (Cr Cl <50). Pt now s/p NS 3L boluses yesterday and hydration with NS at 22ml/h. Total input ~4.5L on 8/12. IVF fluids at 200cc/h , total output not fully recorded 2/2 pt voiding with diarrhea. Pt oliguric since condom cath recording, per pt his urine output has been decreased.  -Continue 1/2 NS at 255ml/h with KCL 74mEQ/L and bicarb (per renal)  -Continue  trending Creatinine  -Renal consulted, appreciate recommendations  -Condom cath for urine output  -Renal US, ordered  4. Thrombocytopenia. Pt with platelets at 98 on admission, trended down to 83, now 43-->30 today. Hg down but not remarkably, could be 2/2 dilution. Concern for DIC thought could also be explained by AIDS and sepsis. Pt with no blood per rectum, has had sputum with blood smear.   -DIC panel with  no schistocytes, elevated D-dimer but elevated fibrinogen.  -HIT score 3/8 -Platelets 1 unit stat  5. Pharyngitis. Improving. Pt with pharyngitis >4 days on admission. Petechial pharyngitis with no exudates, no LAD. GI consulted for EGD. Preliminary report with no evidence for CMV or HSV esophagitis. Sore throat improving.  -Will follow up on viral and fungal cultures.   6. Diarrhea. Pt with at least 4 days of diarrhea. CT abdomen showing reactive abdominal lymph nodes. Sent stool for giardia, cryptosporidium/microsporidium.  - Will have an nonprepped colonoscopy today -C. Diff PCR negative.   7. AIDS: CD4 last 10, has been off ARVs for some time now and is in process of getting back on medications. No concern for IRIS as he has not started meds yet. Case manager to assist pt with emergency Medicaid for anti-retrovirals. Pt states that only his pastor knows of his HIV status, will continue caution.  - PPX with dapsone and azithromycin  - Tx for possible candidal esophagitis with fluconazole for 2 weeks started 7/30  - Per ID: emergency medicaid  - Will avoid AART now, especially truvada 2/2 nephrotoxicity  8. Pruritis: No rashes see on physical exam. Sx could be from Dapsone. Will continue to monitor.   9. DVT ppx: SCDs. Discontinued Lovenox 2/2 decreased platelets. (From 83 to 43)    LOS: 4 days   Ky Barban 05/15/2012, 8:25 AM

## 2012-05-15 NOTE — Care Management Note (Signed)
    Page 1 of 1   05/21/2012     12:08:18 PM   CARE MANAGEMENT NOTE 05/21/2012  Patient:  Zachary Hill,Zachary Hill   Account Number:  [id number]  Date Initiated:  05/15/2012  Documentation initiated by:  Donn Pierini  Subjective/Objective Assessment:   Pt admitted with ARF, fever, N/V     Action/Plan:   PTA pt lived at home, independent with ADLs   Anticipated DC Date:  05/20/2012   Anticipated DC Plan:        DC Planning Services  CM consult      Choice offered to / List presented to:             Status of service:  Completed, signed off Medicare Important Message given?   (If response is "NO", the following Medicare IM given date fields will be blank) Date Medicare IM given:   Date Additional Medicare IM given:    Discharge Disposition:  AGAINST MEDICAL ADVICE  Per UR Regulation:  Reviewed for med. necessity/level of care/duration of stay  If discussed at Long Length of Stay Meetings, dates discussed:   05/21/2012    Comments:  PCP- Drue Second, CYNTHIA  05/21/12- 1200- Donn Pierini RN, BSN [phone] Per floor RN, pt left AMA during night- per pt plan was to go to [facility] for further evaluation.  05/14/12- Donn Pierini RN, BSN [phone] Per CSW f/u with [facility]- [facility] - Sandi with [facility]- is to f/u with patient regarding Medicaid application and will be in touch with pt. per ID working on setting pt up with outpt clinic for assistance with HIV meds  05/13/12- 1100- Donn Pierini RN, BSN [phone] [facility] and message left regarding medicaid application needs.

## 2012-05-16 ENCOUNTER — Encounter (HOSPITAL_COMMUNITY): Payer: Self-pay

## 2012-05-16 ENCOUNTER — Telehealth: Payer: Self-pay | Admitting: *Deleted

## 2012-05-16 ENCOUNTER — Inpatient Hospital Stay (HOSPITAL_COMMUNITY): Payer: Medicaid Other

## 2012-05-16 DIAGNOSIS — B20 Human immunodeficiency virus [HIV] disease: Secondary | ICD-10-CM

## 2012-05-16 LAB — CRYPTOCOCCAL ANTIGEN, CSF: Crypto Ag: NEGATIVE

## 2012-05-16 LAB — CSF CELL COUNT WITH DIFFERENTIAL
Eosinophils, CSF: NONE SEEN % (ref 0–1)
Tube #: 3

## 2012-05-16 LAB — CBC WITH DIFFERENTIAL/PLATELET
Basophils Relative: 0 % (ref 0–1)
Eosinophils Relative: 8 % — ABNORMAL HIGH (ref 0–5)
HCT: 24.7 % — ABNORMAL LOW (ref 39.0–52.0)
Hemoglobin: 8.8 g/dL — ABNORMAL LOW (ref 13.0–17.0)
Lymphocytes Relative: 6 % — ABNORMAL LOW (ref 12–46)
Lymphs Abs: 0.4 10*3/uL — ABNORMAL LOW (ref 0.7–4.0)
MCV: 75.3 fL — ABNORMAL LOW (ref 78.0–100.0)
Monocytes Relative: 2 % — ABNORMAL LOW (ref 3–12)
Neutro Abs: 5.9 10*3/uL (ref 1.7–7.7)
RBC: 3.28 MIL/uL — ABNORMAL LOW (ref 4.22–5.81)
RDW: 13.5 % (ref 11.5–15.5)
WBC: 7 10*3/uL (ref 4.0–10.5)

## 2012-05-16 LAB — STOOL CULTURE

## 2012-05-16 LAB — PREPARE PLATELET PHERESIS

## 2012-05-16 LAB — RENAL FUNCTION PANEL
BUN: 58 mg/dL — ABNORMAL HIGH (ref 6–23)
CO2: 15 mEq/L — ABNORMAL LOW (ref 19–32)
Glucose, Bld: 84 mg/dL (ref 70–99)
Phosphorus: 1.9 mg/dL — ABNORMAL LOW (ref 2.3–4.6)
Potassium: 3.7 mEq/L (ref 3.5–5.1)

## 2012-05-16 LAB — GRAM STAIN

## 2012-05-16 MED ORDER — LAMIVUDINE 150 MG PO TABS: 150.0000 mg | ORAL_TABLET | Freq: Every day | ORAL | Status: DC

## 2012-05-16 MED ORDER — RITONAVIR 100 MG PO TABS: 100.0000 mg | ORAL_TABLET | Freq: Every day | ORAL | Status: DC

## 2012-05-16 MED ORDER — SODIUM BICARBONATE 8.4 % IV SOLN
INTRAVENOUS | Status: DC
  Administered 2012-05-16 – 2012-05-18 (×4): via INTRAVENOUS
  Filled 2012-05-16 (×18): qty 1000

## 2012-05-16 MED ORDER — DARUNAVIR ETHANOLATE 800 MG PO TABS: 800.0000 mg | ORAL_TABLET | Freq: Every day | ORAL | Status: DC

## 2012-05-16 MED ORDER — FERUMOXYTOL INJECTION 510 MG/17 ML
510.0000 mg | Freq: Once | INTRAVENOUS | Status: AC
  Administered 2012-05-16: 510 mg via INTRAVENOUS
  Filled 2012-05-16: qty 17

## 2012-05-16 MED ORDER — RALTEGRAVIR POTASSIUM 400 MG PO TABS: 400.0000 mg | ORAL_TABLET | Freq: Two times a day (BID) | ORAL | Status: DC

## 2012-05-16 MED ORDER — POTASSIUM CHLORIDE 20 MEQ/15ML (10%) PO LIQD
40.0000 meq | Freq: Two times a day (BID) | ORAL | Status: AC
  Administered 2012-05-16: 40 meq via ORAL
  Filled 2012-05-16: qty 30

## 2012-05-16 NOTE — Progress Notes (Signed)
Utilization review completed.  

## 2012-05-16 NOTE — Progress Notes (Signed)
Subjective: He reports no nausea or vomiting today, he would like to try solid foods. He has soft stools now but no diarrhea. He feels better overall. His cough is improving but his sore throat is worse,  he reports that "it hurts to talk".    He denies mental confusion, neck pain or stiffness, chest pain, abdominal pain, or rectal bleeding.  Objective: Vital signs in last 24 hours: Filed Vitals:   05/16/12 1520 05/16/12 1539 05/16/12 1736 05/16/12 1737  BP: 118/67  128/75   Pulse:  173 111 111  Temp:  98.4 F (36.9 C) 98.4 F (36.9 C)   TempSrc:  Oral Oral   Resp:  34 29 41  Height:      Weight:      SpO2:  100% 99% 96%   Weight change:   Intake/Output Summary (Last 24 hours) at 05/16/12 1928 Last data filed at 05/16/12 1538  Gross per 24 hour  Intake    520 ml  Output   2180 ml  Net  -1660 ml  Vitals reviewed.  General: Sitting in bed, brushing his teeth, in NAD  HEENT: bilateral conjunctivitis, with focal vessel erythema of the medial right eye. No eye discharge,  Pharyngitis with erythematous oropharynx but no exudates. No thrush. No cervical or axillary LAD.  Cardiac: tachycardic, no rubs, murmurs or gallops  Pulm: bibasilar lung crackles, no wheezes, rales, or rhonchi  Abd: soft, mid epigastric tenderness, nondistended, BS present  Ext: warm and well perfused, no pedal edema, no plantar or palmar rash. No rash noted on his abdomen, back, chest, UE or LE.  Neuro: alert and oriented X3, cranial nerves II-XII grossly intact, strength and sensation to light touch equal in bilateral upper and lower extremities    Lab Results: Basic Metabolic Panel:  Lab 05/16/12 1610 05/15/12 0425  NA 131* 135  K 3.7 3.1*  CL 102 104  CO2 15* 14*  GLUCOSE 84 104*  BUN 58* 52*  CREATININE 6.41* 6.30*  CALCIUM 7.5* 7.5*  MG -- --  PHOS 1.9* 3.0   Liver Function Tests:  Lab 05/16/12 0500 05/15/12 0425 05/14/12 0530 05/12/12 0523  AST -- -- 77* 77*  ALT -- -- 67* 120*  ALKPHOS --  -- 133* 256*  BILITOT -- -- 0.9 1.7*  PROT -- -- 6.5 7.1  ALBUMIN 2.0* 2.3* -- --    Lab 05/11/12 1347  LIPASE 52  AMYLASE --   CBC:  Lab 05/16/12 0500 05/15/12 0425 05/14/12 0530  WBC 7.0 6.1 --  NEUTROABS 5.9 -- 7.6  HGB 8.8* 9.6* --  HCT 24.7* 26.8* --  MCV 75.3* 75.5* --  PLT 59* 30* --   D-Dimer:  Lab 05/14/12 0954  DDIMER 7.59*   Fasting Lipid Panel:  Lab 05/12/12 1932  CHOL 141  HDL 13*  LDLCALC 85  TRIG 960*  CHOLHDL 10.8  LDLDIRECT --   Coagulation:  Lab 05/14/12 0954  LABPROT 17.2*  INR 1.38   Anemia Panel:  Lab 05/15/12 0425 05/12/12 1234  VITAMINB12 -- --  FOLATE -- --  FERRITIN -- 1334*  TIBC 149* --  IRON 28* --  RETICCTPCT -- --   Urinalysis:  Lab 05/13/12 2249 05/11/12 1218  COLORURINE ORANGE* ORANGE*  LABSPEC 1.027 1.029  PHURINE 5.0 6.0  GLUCOSEU NEGATIVE NEGATIVE  HGBUR LARGE* NEGATIVE  BILIRUBINUR SMALL* SMALL*  KETONESUR 15* 15*  PROTEINUR 100* 100*  UROBILINOGEN 0.2 4.0*  NITRITE POSITIVE* NEGATIVE  LEUKOCYTESUR MODERATE* TRACE*    Micro  Results: Recent Results (from the past 240 hour(s))  URINE CULTURE     Status: Normal   Collection Time   05/11/12 12:18 PM      Component Value Range Status Comment   Specimen Description URINE, CATHETERIZED   Final    Special Requests NONE   Final    Culture  Setup Time 05/11/2012 22:56   Final    Colony Count NO GROWTH   Final    Culture NO GROWTH   Final    Report Status 05/12/2012 FINAL   Final   CULTURE, BLOOD (ROUTINE X 2)     Status: Normal (Preliminary result)   Collection Time   05/11/12  4:10 PM      Component Value Range Status Comment   Specimen Description BLOOD LEFT ARM   Final    Special Requests BOTTLES DRAWN AEROBIC AND ANAEROBIC 10CC   Final    Culture  Setup Time 05/11/2012 22:55   Final    Culture     Final    Value:        BLOOD CULTURE RECEIVED NO GROWTH TO DATE CULTURE WILL BE HELD FOR 5 DAYS BEFORE ISSUING A FINAL NEGATIVE REPORT   Report Status  PENDING   Incomplete   CULTURE, BLOOD (ROUTINE X 2)     Status: Normal (Preliminary result)   Collection Time   05/11/12  4:25 PM      Component Value Range Status Comment   Specimen Description BLOOD LEFT HAND   Final    Special Requests BOTTLES DRAWN AEROBIC AND ANAEROBIC 10CC   Final    Culture  Setup Time 05/11/2012 22:55   Final    Culture     Final    Value:        BLOOD CULTURE RECEIVED NO GROWTH TO DATE CULTURE WILL BE HELD FOR 5 DAYS BEFORE ISSUING A FINAL NEGATIVE REPORT   Report Status PENDING   Incomplete   AFB CULTURE, BLOOD     Status: Normal (Preliminary result)   Collection Time   05/12/12 12:33 PM      Component Value Range Status Comment   Specimen Description BLOOD LEFT ARM   Final    Special Requests BOTTLES DRAWN AEROBIC ONLY 3CC   Final    Culture     Final    Value: CULTURE WILL BE EXAMINED FOR 6 WEEKS BEFORE ISSUING A FINAL REPORT   Report Status PENDING   Incomplete   MRSA PCR SCREENING     Status: Normal   Collection Time   05/12/12  3:35 PM      Component Value Range Status Comment   MRSA by PCR NEGATIVE  NEGATIVE Final   CULTURE, EXPECTORATED SPUTUM-ASSESSMENT     Status: Normal   Collection Time   05/12/12  5:04 PM      Component Value Range Status Comment   Specimen Description SPUTUM   Final    Special Requests NONE   Final    Sputum evaluation     Final    Value: MICROSCOPIC FINDINGS SUGGEST THAT THIS SPECIMEN IS NOT REPRESENTATIVE OF LOWER RESPIRATORY SECRETIONS. PLEASE RECOLLECT.     CALLED TO MATTHEWS,M RN 05/12/12 1745 WOOTEN,K   Report Status 05/12/2012 FINAL   Final   STOOL CULTURE     Status: Normal   Collection Time   05/12/12  7:01 PM      Component Value Range Status Comment   Specimen Description STOOL   Final  Special Requests Immunocompromised   Final    Culture     Final    Value: NO SALMONELLA, SHIGELLA, CAMPYLOBACTER, YERSINIA, OR E.COLI [id number] ISOLATED     Note: REDUCED NORMAL FLORA PRESENT   Report Status 05/16/2012 FINAL    Final   OVA AND PARASITE EXAMINATION     Status: Normal   Collection Time   05/12/12  7:01 PM      Component Value Range Status Comment   Specimen Description STOOL   Final    Special Requests NONE   Final    Ova and parasites NO OVA OR PARASITES SEEN   Final    Report Status 05/15/2012 FINAL   Final   CRYPTOSPORIDIUM SMEAR, FECAL     Status: Normal   Collection Time   05/12/12  7:01 PM      Component Value Range Status Comment   Specimen Description STOOL   Final    Special Requests NONE   Final    Cryptosporidium Smear. NO Cryptosporidium Cyclospora or Isospora seen.   Final    Report Status 05/14/2012 FINAL   Final   AFB CULTURE, BLOOD     Status: Normal (Preliminary result)   Collection Time   05/13/12  9:35 AM      Component Value Range Status Comment   Specimen Description BLOOD LEFT ARM   Final    Special Requests 5CC   Final    Culture     Final    Value: CULTURE WILL BE EXAMINED FOR 6 WEEKS BEFORE ISSUING A FINAL REPORT   Report Status PENDING   Incomplete   CULTURE, EXPECTORATED SPUTUM-ASSESSMENT     Status: Normal   Collection Time   05/13/12 11:03 AM      Component Value Range Status Comment   Specimen Description SPUTUM   Final    Special Requests Immunocompromised   Final    Sputum evaluation     Final    Value: MICROSCOPIC FINDINGS SUGGEST THAT THIS SPECIMEN IS NOT REPRESENTATIVE OF LOWER RESPIRATORY SECRETIONS. PLEASE RECOLLECT.     Gram Stain Report Called to,Read Back By and Verified With: Ernestina Penna RN 12:05 05/13/12 ([email or web address])   Report Status 05/13/2012 FINAL   Final   FUNGUS CULTURE W SMEAR     Status: Normal (Preliminary result)   Collection Time   05/13/12  4:41 PM      Component Value Range Status Comment   Specimen Description TISSUE ESOPHAGUS   Final    Special Requests NONE   Final    Fungal Smear NO YEAST OR FUNGAL ELEMENTS SEEN   Final    Culture CULTURE IN PROGRESS FOR FOUR WEEKS   Final    Report Status PENDING   Incomplete   VIRAL CULTURE VIRC      Status: Normal (Preliminary result)   Collection Time   05/13/12  4:41 PM      Component Value Range Status Comment   Specimen Description TISSUE ESOPHAGUS LOOK FOR CMV AND HERPES   Final    Special Requests NONE   Final    Culture Culture has been initiated.   Final    Report Status PENDING   Incomplete   CLOSTRIDIUM DIFFICILE BY PCR     Status: Normal   Collection Time   05/14/12  3:53 PM      Component Value Range Status Comment   C difficile by pcr NEGATIVE  NEGATIVE Final   FUNGUS CULTURE W SMEAR     Status:  Normal (Preliminary result)   Collection Time   05/15/12  3:54 PM      Component Value Range Status Comment   Specimen Description TISSUE   Final    Special Requests     Final    Value: SAMPLE NO 1 SPECIMEN IN CUP TRANSVERSE AND DESC COLON   Fungal Smear NO YEAST OR FUNGAL ELEMENTS SEEN   Final    Culture CULTURE IN PROGRESS FOR FOUR WEEKS   Final    Report Status PENDING   Incomplete   GRAM STAIN     Status: Normal   Collection Time   05/16/12  5:20 PM      Component Value Range Status Comment   Specimen Description CSF   Final    Special Requests TUBE 2@6 .5CC   Final    Gram Stain     Final    Value: CYTOSPIN SLIDE     NO ORGANISMS SEEN     WBC PRESENT,BOTH PMN AND MONONUCLEAR   Report Status 05/16/2012 FINAL   Final    Studies/Results: US Renal  05/15/2012  *RADIOLOGY REPORT*  Clinical Data:   HIV positive.  Oliguria.  RENAL / URINARY TRACT ULTRASOUND  Technique:  Complete ultrasound exam of the kidneys and urinary bladder was performed.  Comparison: CT scan from 05/12/2012  Findings:  The right kidney measures 11.8 cm in long axis.  The left kidney measures 12.1 cm.  No evidence for hydronephrosis.  No renal mass. Both kidneys show increased renal cortical echogenicity.  Midline imaging of the pelvis reveals a normal appearing urinary bladder.  Impression:  Bilateral echogenic kidneys, consistent with medical renal disease. No evidence for hydronephrosis.  Original  Report Authenticated By: ERIC A. MANSELL, M.D.   Medications: I have reviewed the patient's current medications. Scheduled Meds:   . azithromycin  600 mg Oral Daily  . dapsone  100 mg Oral Daily  . ethambutol  15 mg/kg Oral Daily  . ferumoxytol  510 mg Intravenous Once  . fluconazole (DIFLUCAN) IV  100 mg Intravenous Q24H  . potassium chloride  40 mEq Oral BID  . senna  1 tablet Oral BID  . DISCONTD: darunavir  800 mg Oral Q breakfast  . DISCONTD: lamiVUDine  150 mg Oral Daily  . DISCONTD: potassium chloride  40 mEq Oral BID  . DISCONTD: raltegravir  400 mg Oral BID  . DISCONTD: ritonavir  100 mg Oral Q breakfast   Continuous Infusions:   . dextrose 5 % 1,000 mL with potassium chloride 50 mEq, sodium bicarbonate 150 mEq infusion 200 mL/hr at 05/16/12 1321  . DISCONTD: sodium chloride 0.45 % 1,000 mL with potassium chloride 50 mEq, sodium bicarbonate 75 mEq infusion 200 mL/hr at 05/16/12 0744   PRN Meds:.acetaminophen, acetaminophen, ibuprofen, ondansetron (ZOFRAN) IV, ondansetron, promethazine Assessment/Plan: 61 man with HIV now AIDS, off ART for a year and now with CD4 < 10. Back with Dr. Drue Second and on dapsone, azithromycin, and diflucan pending ADAP to resume ART. Adm for tepm of 103 w/o localizing site of infection. Has thrush but on rx for > a week.   1. SIRS/ Fever: Given CD4 count 10 concern for sepsis from MAC versus bacteremia is valid. No acute evidence for pneumonia even with repeat CXR in AM after fluids to rule out PCP. Urine unremarkable. Blood cultures times 2 drawn in ED. Vanc and Zosyn started on eve of 8/10. Pt with persistent fever, tachycardia, tacpnea on 8/11 AM. New headache on admission but no signs or  symptoms concerning for meningitis or encephalitis. Pt also with pharyngitis x4 days, N/V/D x2 days. Dr. Drue Second following closely. Pt with low threshold for LP for CSF analysis. Transfered to stepdown. Required 2 NS 1L boluses in last 24 hours. Repeat CXR unchanged.  Repeat CT head without contrast negative for acute changes, CT abdomen without contrast showing mild bowel inflammation with reactive lymph nodes. Zosyn stopped on eve of 8/11, pt started on Rocephin and flagyl IV that night. Pt currently also on dapsone 100mg  daily for PCP prophylaxis, Ethambutol for MAC empiric tx, fluconazole 200mg  IV for prophylaxis but now decreased to 100mg  2/2 AKI. Discontinued Vanc 2/2 AKI. WBC up from 8.9 to 14.8 on 8/13 but down to 8.8 today. Pt febrile all day today with Tmax of 102.6. - Discontinued flagyl 8/13 - LP via IR with CSF analysis, today cultures/analysis pending but cryptococcus Ab negative  - Consider MRI head tomorrow  - Follow up on cultures  - LDH normal  -Toxoplasma antibody: negative  -CMV culture: pending  -AFB culture: pending  -Blood cultures: pending  -Herpes simplex cx: pending  -Fungal culture with smear: pending  -Respiratory culture: not of lower respiratory airway  -Stool culture: pending  -Urine culture: NGTD  -Ova and parasite: pending  - C diff stool PCR: negative   2. Nausea/vomiting: Concern for bacterial vs viral illness given weakened immune system. CXR unremarkable and US abdomen unremarkable. Pt with reported vomiting overnight. EGD unremarkable for CMV, HSV, candida esophagitis but viral cultures pending. Colonoscopy negative for CMV/HSV, but cultures pending. He reports no N/V since last night.  - Advanced diet to renal  - NS @ 200 cc/hr with KCl and bicarbonate per renal, will decreased per recommendations.  3. Acute kidney injury. Pt with creatinine up from ~1.4 to 3.99-->4.85-->. Vanc level 41 (trough) on 8/13 . Random van 20.2 on 8/14. UA dirty, with granular casts but FeNa 0.6%. This could be 2/2 Vanc superimposed on mild prerenal azotemia 2/2 emesis, decreased PO v. IV dye contrast toxicity. Vancomycin stopped on 8/13. Decreased Lovenox to 30mg  SQ (Cr Cl <30). Decreased fluconazole to 100mg  q24h, (Cr Cl <50). Pt had NS 1L  boluses x(3) on 8/13 and hydration with NS at 254ml/h. Total input ~4.5L on 8/12. IVF fluids at 200cc/h , total output not fully recorded 2/2 pt voiding with diarrhea. Pt was oliguric on 8/14 with renal US showing no hydronephrosis or obstruction but increased renal cortical echogenicity. He had urine output of 1055 ml today @0 .5/ml/hr. He has bibasilar crackles but no shortness of breath or pitting edema.  -MIVF changed to  D5 at 219ml/h with KCL 15mEQ/L and bicarb (per renal) -Continue trending Creatinine  -Renal consulted, appreciate recommendations  -Condom cath for urine output   4. Thrombocytopenia. Pt with platelets at 98 on admission, trended down to 83, now 43-->30 today. Hg down but not remarkably, could be 2/2 dilution. Concern for DIC thought could also be explained by AIDS and sepsis. Pt with no blood per rectum, has had sputum with blood smear.  -DIC panel with no schistocytes, elevated D-dimer but elevated fibrinogen. HIT score 3/8. Received 1 unit of platelets stat on 8/14 pre-colonoscopy. Platelets at 59 today. No bleeding reported per patient.  -Continue monitoring.    5. Pharyngitis. Pt with hoarseness today. Pt with pharyngitis >4 days on admission. Petechial pharyngitis with no exudates, no LAD. GI consulted for EGD. Preliminary report with no evidence for CMV or HSV esophagitis. Oropharynx with not thrush, but with edema  and erythema.    -Will follow up on viral and fungal cultures.   6. Diarrhea. Improving. Pt with at least 4 days of diarrhea on admission. CT abdomen showing reactive abdominal lymph nodes. Sent stool for giardia, cryptosporidium/microsporidium: pending. Pt reports soft stools now but no diarrhea.  -C. Diff PCR negative.   7. AIDS: CD4 last 10, has been off ARVs for some time now and is in process of getting back on medications. No concern for IRIS as he has not started meds yet. Case manager to assist pt with emergency Medicaid for anti-retrovirals. Pt  states that only his pastor knows of his HIV status, will continue caution.  - PPX with dapsone and azithromycin  - Tx for possible candidal esophagitis with fluconazole for 2 weeks started 7/30  - Per ID: emergency medicaid  - Will avoid truvada 2/2 nephrotoxicity  - Pt now s/p LP by IR, cryptococcus antigen negative. CSF cultures, analysis, pending.  - Per ID pt might resume ARV tomorrow. Appreciate recommendations.   8. Pruritis: resolved now. No rashes seen on physical exam. Sx could be from Dapsone. Will continue to monitor.   9. DVT ppx: SCDs. Discontinued Lovenox 2/2 decreased platelets on 8/12. (From 83 to 43)   LOS: 5 days   Ky Barban 05/16/2012, 7:28 PM

## 2012-05-16 NOTE — Progress Notes (Signed)
Pt on way to  Interventional radiology for LP. No complaints

## 2012-05-16 NOTE — Progress Notes (Signed)
INFECTIOUS DISEASE ATTENDING ADDENDUM:     [facility]   Date: 05/16/2012  Patient name: Zachary Hill  Medical record number: [id number]  Date of birth: November 07, 1980    This patient has been seen and discussed with the house staff. Please see their note for complete details. I concur with their findings with the following additions/corrections:   Patient still febrile. I discussed my concerns with pt, his pastor and his girlfriend. My concern at this point is to rule in or rule out cryptococcal meningitis with LP. LP could be done with my supervision with house staff at bedside if pt is agreeable or under IR fluoro  Either way  We would want:  1) Documented opening pressure 2) STAT CSF CRYPTOCOCCAL AG 3) CELL COUNT AND DIFF 4)PROTEIN AND GLUCOSE 5) CULTURE 6) CSF SENT FOR CMV PCR 7) CSF SENT FOR TOXOPLASMA PCR 8) REMAINING CSF SAVED   I also would like to get an MRI of his brain (without gad due to renal fxn)  We will not start ARV until we have ruled in or out crypto because consquences of IRIS to crypto could be life threatening and I would want to delay ARV until crypto is treated for at least a few weeks   Acey Lav 05/16/2012, 12:37 PM

## 2012-05-16 NOTE — Telephone Encounter (Signed)
Phone call to pt to

## 2012-05-16 NOTE — Progress Notes (Signed)
Nephrology Service Daily Progress Note  Subjective:  Patient febrile to 102.6 overnight.  The patient had UOP of 825 overnight, and creatinine appears to be leveling off today.  The patient was transfused pRBC's yesterday, but still Hb dropped from 9.6 to 8.8.  Colonoscopy yesterday revealed small internal hemorrhoids, but no acute source of bleeding.  Renal US showed no hydronephrosis yesterday.  Per ID, recommend starting prezista, norvir, isentress, and lamivudine.  The patient notes less nausea and diarrhea yesterday, but notes some dyspnea.  Objective Vital signs in last 24 hours: Filed Vitals:   05/16/12 0049 05/16/12 0435 05/16/12 0452 05/16/12 0540  BP: 114/65  117/75   Pulse: 121  102   Temp: 102.6 F (39.2 C) 100.5 F (38.1 C)  99.7 F (37.6 C)  TempSrc: Oral Oral  Oral  Resp: 29  34   Height:      Weight:      SpO2: 100%  100%    Weight change:   Intake/Output Summary (Last 24 hours) at 05/16/12 0746 Last data filed at 05/16/12 0230  Gross per 24 hour  Intake   1767 ml  Output   1730 ml  Net     37 ml  UOP: 825  General: sitting in bed, appears uncomfortable HEENT: +bilateral conjunctival injection noted, pupils equal round and reactive to light, vision grossly intact, oropharynx clear and non-erythematous  Neck: supple, no lymphadenopathy Lungs: clear to ascultation bilaterally, normal work of respiration, no wheezes, rales, ronchi Heart: regular rate and rhythm, no murmurs, gallops, or rubs Abdomen: soft, non-tender, non-distended, normal bowel sounds  Extremities: no cyanosis, clubbing, or edema Neurologic: alert & oriented X3, cranial nerves II-XII intact, strength grossly intact, sensation intact to light touch, patient is right-handed   Labs: Basic Metabolic Panel:  Lab 05/16/12 7829 05/15/12 0425 05/14/12 0530 05/13/12 1918 05/13/12 1000 05/12/12 0523 05/11/12 1159  NA 131* 135 138 137 134* 133* 134*  K 3.7 3.1* 2.9* 3.2* 3.1* 3.4* 3.6  CL 102 104 107  103 102 102 100  CO2 15* 14* 15* 17* 14* 20 20  GLUCOSE 84 104* 90 97 104* 123* 104*  BUN 58* 52* 43* 33* 23 12 12   CREATININE 6.41* 6.30* 4.95* 3.99* 3.25* 1.14 0.91  ALB -- -- -- -- -- -- --  CALCIUM 7.5* 7.5* 7.7* 8.1* 8.2* 8.8 9.5  PHOS 1.9* 3.0 3.1 -- -- -- --   Liver Function Tests:  Lab 05/16/12 0500 05/15/12 0425 05/14/12 0530 05/12/12 0523 05/11/12 1159  AST -- -- 77* 77* 85*  ALT -- -- 67* 120* 138*  ALKPHOS -- -- 133* 256* 293*  BILITOT -- -- 0.9 1.7* 1.3*  PROT -- -- 6.5 7.1 8.4*  ALBUMIN 2.0* 2.3* 2.5* -- --    Lab 05/11/12 1347  LIPASE 52  AMYLASE --   CBC:  Lab 05/16/12 0500 05/15/12 0425 05/14/12 0954 05/14/12 0530 05/13/12 1000 05/11/12 1159  WBC 7.0 6.1 -- 8.3 14.8* --  NEUTROABS 5.9 -- -- 7.6 -- 8.2*  HGB 8.8* 9.6* -- 10.8* 12.0* --  HCT 24.7* 26.8* -- 30.8* 35.1* --  MCV 75.3* 75.5* -- 77.2* 79.1 --  PLT 59* 30* 34* 43* -- --   PT/INR:  Lab 05/14/12 0954  INR 1.38    Iron Studies:   Lab 05/15/12 0425 05/12/12 1234  IRON 28* --  TIBC 149* --  TRANSFERRIN -- --  FERRITIN -- 1334*  Saturation ratio: 19  Medications: Scheduled Meds:    . azithromycin  600 mg Oral Daily  . dapsone  100 mg Oral Daily  . ethambutol  15 mg/kg Oral Daily  . fluconazole (DIFLUCAN) IV  100 mg Intravenous Q24H  . potassium chloride  40 mEq Oral BID  . senna  1 tablet Oral BID  . DISCONTD: potassium chloride  40 mEq Oral Once   Continuous Infusions:    . sodium chloride 0.45 % 1,000 mL with potassium chloride 50 mEq, sodium bicarbonate 75 mEq infusion 200 mL/hr at 05/16/12 0744  . DISCONTD: sodium chloride 0.45 % 1,000 mL with potassium chloride 40 mEq, sodium bicarbonate 75 mEq infusion 200 mL/hr at 05/14/12 2346   PRN Meds:.acetaminophen, acetaminophen, ibuprofen, ondansetron (ZOFRAN) IV, ondansetron, promethazine, DISCONTD: fentaNYL, DISCONTD: midazolam  I  have reviewed scheduled and prn medications.  Assessment/Plan:  The patient is a 31 yo man,  history of HIV (CD4 = 10, VL = 475K, 03/21/12), prior HBV (sAg neg, sAb pos, cAb pos, 03/21/12), presenting 8/10 with nausea, vomiting, SOB, and fevers, consulted for acute renal failure.   # Acute Renal Failure - the patient presents with acute renal failure, likely initially representing contrast-induced nephropathy, complicated by vancomycin-induced nephropathy. Other potential etiologies include AIN (though no rash, no peripheral eosinophilia). Less likely HUS (no schistocytes on smear, normal LDH on 8/11, but drop in Hb). No evidence of post-renal obstruction. -agree with discontinuing vanc, per primary team  -vanc levels downtrending -continue IVF at 200 cc/hr with bicarb and K, will increase concentration of bicarb given continued low serum bicarb  # Anemia - Patient has continued anemia, despite blood transfusion yesterday.  No obvious source of bleeding on colonoscopy.  Concern for hemolytic anemia (perhaps warm autoantibody HA?  This can be seen in HIV). Ferritin = 1334 on 05/12/12.  Saturation ratio 19, 05/15/12. -managed per primary team, consider LDH, haptoglobin, coombs test -given borderline low sat ratio, will give feraheme x1 dose  # Hypokalemia - likely secondary to GI losses  -currently being repleted by IVF -patient given 40 meq PO x2 yesterday, will repeat x1 today  # Thrombocytopenia - drop in platelets from 98 on admission to 34 today. Per HIT score, pt has low probability of HIT (score = 3/8). Other possibilities include HIV.  Platelets now uptrending slightly. -DIC panel negative  # HIV - CD4 = 10 -per primary team; ID recommends starting prezista, norvir, isentress, lamivudine  Janalyn Harder, PGY2 Pgr. [phone] 05/16/2012, 7:46 AM  Mr Aguiniga is having less diarrhea.  Hgb continues to fall (colonoscopy neg).  Cr has begun to level off (I hope).  Will continue IV bicarb (150 mEq in D5W).  He agrees to LP.  Will follow. Rx IV Fe for Fe deficiency.

## 2012-05-16 NOTE — Telephone Encounter (Signed)
I will also have to rearrange his anti virals

## 2012-05-16 NOTE — Telephone Encounter (Signed)
Pt currently hospitalized.  Pt will receive on discharge from hospital.

## 2012-05-17 ENCOUNTER — Encounter (HOSPITAL_COMMUNITY): Payer: Self-pay | Admitting: Gastroenterology

## 2012-05-17 ENCOUNTER — Inpatient Hospital Stay (HOSPITAL_COMMUNITY): Payer: Medicaid Other

## 2012-05-17 DIAGNOSIS — B20 Human immunodeficiency virus [HIV] disease: Secondary | ICD-10-CM

## 2012-05-17 LAB — RENAL FUNCTION PANEL
Albumin: 1.9 g/dL — ABNORMAL LOW (ref 3.5–5.2)
BUN: 57 mg/dL — ABNORMAL HIGH (ref 6–23)
CO2: 19 mEq/L (ref 19–32)
Calcium: 7.7 mg/dL — ABNORMAL LOW (ref 8.4–10.5)
Chloride: 101 mEq/L (ref 96–112)
Chloride: 99 mEq/L (ref 96–112)
Creatinine, Ser: 6.06 mg/dL — ABNORMAL HIGH (ref 0.50–1.35)
Creatinine, Ser: 6 mg/dL — ABNORMAL HIGH (ref 0.50–1.35)
GFR calc Af Amer: 13 mL/min — ABNORMAL LOW (ref >90)
GFR calc non Af Amer: 11 mL/min — ABNORMAL LOW (ref >90)
GFR calc non Af Amer: 11 mL/min — ABNORMAL LOW (ref >90)
Phosphorus: 2 mg/dL — ABNORMAL LOW (ref 2.3–4.6)
Sodium: 134 mEq/L — ABNORMAL LOW (ref 135–145)

## 2012-05-17 LAB — BASIC METABOLIC PANEL
CO2: 21 mEq/L (ref 19–32)
Chloride: 98 mEq/L (ref 96–112)
Sodium: 132 mEq/L — ABNORMAL LOW (ref 135–145)

## 2012-05-17 LAB — RETICULOCYTES
Retic Count, Absolute: 11.2 10*3/uL — ABNORMAL LOW (ref 19.0–186.0)
Retic Ct Pct: 0.4 % (ref 0.4–3.1)

## 2012-05-17 LAB — LACTATE DEHYDROGENASE: LDH: 546 U/L — ABNORMAL HIGH (ref 94–250)

## 2012-05-17 LAB — VIRAL CULTURE VIRC

## 2012-05-17 LAB — GIARDIA/CRYPTOSPORIDIUM SCREEN(EIA)

## 2012-05-17 LAB — MAGNESIUM: Magnesium: 1.5 mg/dL (ref 1.5–2.5)

## 2012-05-17 LAB — CULTURE, BLOOD (ROUTINE X 2): Culture: NO GROWTH

## 2012-05-17 LAB — CBC WITH DIFFERENTIAL/PLATELET
Basophils Relative: 0 % (ref 0–1)
Eosinophils Relative: 6 % — ABNORMAL HIGH (ref 0–5)
Hemoglobin: 7.7 g/dL — ABNORMAL LOW (ref 13.0–17.0)
Lymphocytes Relative: 2 % — ABNORMAL LOW (ref 12–46)
Monocytes Relative: 2 % — ABNORMAL LOW (ref 3–12)
Neutrophils Relative %: 90 % — ABNORMAL HIGH (ref 43–77)
RBC: 2.89 MIL/uL — ABNORMAL LOW (ref 4.22–5.81)
WBC Morphology: INCREASED

## 2012-05-17 LAB — HAPTOGLOBIN: Haptoglobin: 346 mg/dL — ABNORMAL HIGH (ref 45–215)

## 2012-05-17 LAB — DIRECT ANTIGLOBULIN TEST (NOT AT ARMC): DAT, complement: POSITIVE

## 2012-05-17 LAB — MICROSPORIDIA SPORE STAIN, FECES

## 2012-05-17 MED ORDER — LAMIVUDINE 150 MG PO TABS
150.0000 mg | ORAL_TABLET | Freq: Once | ORAL | Status: AC
  Administered 2012-05-17: 150 mg via ORAL
  Filled 2012-05-17: qty 1

## 2012-05-17 MED ORDER — DARUNAVIR ETHANOLATE 800 MG PO TABS
800.0000 mg | ORAL_TABLET | Freq: Every day | ORAL | Status: DC
  Administered 2012-05-17 – 2012-05-20 (×4): 800 mg via ORAL
  Filled 2012-05-17 (×4): qty 1

## 2012-05-17 MED ORDER — PANTOPRAZOLE SODIUM 40 MG IV SOLR: 40.0000 mg | INTRAVENOUS | Status: DC

## 2012-05-17 MED ORDER — ATOVAQUONE 750 MG/5ML PO SUSP
1500.0000 mg | Freq: Every day | ORAL | Status: DC
  Administered 2012-05-18 – 2012-05-20 (×3): 1500 mg via ORAL
  Filled 2012-05-17 (×4): qty 10

## 2012-05-17 MED ORDER — MAGNESIUM SULFATE 40 MG/ML IJ SOLN
4.0000 g | Freq: Once | INTRAMUSCULAR | Status: AC
  Administered 2012-05-17: 4 g via INTRAVENOUS
  Filled 2012-05-17: qty 100

## 2012-05-17 MED ORDER — DEXTROSE 5 % IV SOLN
30.0000 mmol | Freq: Once | INTRAVENOUS | Status: AC
  Administered 2012-05-17: 30 mmol via INTRAVENOUS
  Filled 2012-05-17: qty 10

## 2012-05-17 MED ORDER — PANTOPRAZOLE SODIUM 40 MG PO TBEC
40.0000 mg | DELAYED_RELEASE_TABLET | Freq: Every day | ORAL | Status: DC
  Administered 2012-05-17 – 2012-05-20 (×4): 40 mg via ORAL
  Filled 2012-05-17 (×4): qty 1

## 2012-05-17 MED ORDER — RITONAVIR 100 MG PO TABS
100.0000 mg | ORAL_TABLET | Freq: Every day | ORAL | Status: DC
  Administered 2012-05-17 – 2012-05-20 (×4): 100 mg via ORAL
  Filled 2012-05-17 (×4): qty 1

## 2012-05-17 MED ORDER — RALTEGRAVIR POTASSIUM 400 MG PO TABS
400.0000 mg | ORAL_TABLET | Freq: Two times a day (BID) | ORAL | Status: DC
  Administered 2012-05-17 – 2012-05-20 (×7): 400 mg via ORAL
  Filled 2012-05-17 (×7): qty 1

## 2012-05-17 MED ORDER — LAMIVUDINE 10 MG/ML PO SOLN
50.0000 mg | Freq: Every day | ORAL | Status: DC
  Administered 2012-05-18 – 2012-05-20 (×3): 50 mg via ORAL
  Filled 2012-05-17 (×3): qty 5

## 2012-05-17 NOTE — Progress Notes (Signed)
[facility]    Date of Admission:  05/11/2012   Total days of antibiotics 5        Day Azithromycin: 5        Day Ethambutol: 5        Day Dapsone: 4 Principal Problem:  *Fever Active Problems:  Human immunodeficiency virus (HIV) disease  Nausea & vomiting  Odynophagia  Diarrhea  Pharyngitis  Acute kidney injury  Thrombocytopenia      . atovaquone  1,500 mg Oral Q breakfast  . azithromycin  600 mg Oral Daily  . ethambutol  15 mg/kg Oral Daily  . ferumoxytol  510 mg Intravenous Once  . fluconazole (DIFLUCAN) IV  100 mg Intravenous Q24H  . potassium chloride  40 mEq Oral BID  . senna  1 tablet Oral BID  . sodium phosphate  Dextrose 5% IVPB  30 mmol Intravenous Once  . DISCONTD: dapsone  100 mg Oral Daily  . DISCONTD: darunavir  800 mg Oral Q breakfast  . DISCONTD: lamiVUDine  150 mg Oral Daily  . DISCONTD: raltegravir  400 mg Oral BID  . DISCONTD: ritonavir  100 mg Oral Q breakfast    Subjective: Zachary Hill  Objective: Temp:  [98.4 F (36.9 C)-102.7 F (39.3 C)] 102.7 F (39.3 C) (08/16 0800) Pulse Rate:  [107-173] 107  (08/16 0800) Resp:  [29-41] 36  (08/16 0800) BP: (105-128)/(62-81) 128/81 mmHg (08/16 0800) SpO2:  [93 %-100 %] 96 % (08/16 0800)  General: lying in bed, A&OX3 HEENT: +bilateral conjunctival injection noted, Thrush noted on tongue  Neck: supple, no lymphadenopathy Lungs: CTAB, no wheezes, rales, ronchi Heart: RRR, no murmurs, gallops, or rubs Abdomen: soft, non-tender, non-distended, normal bowel sounds  Extremities: no cyanosis, clubbing, or edema     Lab Results Lab Results  Component Value Date   WBC 10.7* 05/17/2012   HGB 7.7* 05/17/2012   HCT 21.7* 05/17/2012   MCV 75.1* 05/17/2012   PLT 91* 05/17/2012    Lab Results  Component Value Date   CREATININE 6.10* 05/17/2012   BUN 59* 05/17/2012   NA 132* 05/17/2012   K 3.6 05/17/2012   CL 98 05/17/2012   CO2 21 05/17/2012    Lab Results  Component Value Date    ALT 67* 05/14/2012   AST 77* 05/14/2012   ALKPHOS 133* 05/14/2012   BILITOT 0.9 05/14/2012      Microbiology: Recent Results (from the past 240 hour(s))  URINE CULTURE     Status: Normal   Collection Time   05/11/12 12:18 PM      Component Value Range Status Comment   Specimen Description URINE, CATHETERIZED   Final    Special Requests NONE   Final    Culture  Setup Time 05/11/2012 22:56   Final    Colony Count NO GROWTH   Final    Culture NO GROWTH   Final    Report Status 05/12/2012 FINAL   Final   CULTURE, BLOOD (ROUTINE X 2)     Status: Normal   Collection Time   05/11/12  4:10 PM      Component Value Range Status Comment   Specimen Description BLOOD LEFT ARM   Final    Special Requests BOTTLES DRAWN AEROBIC AND ANAEROBIC 10CC   Final    Culture  Setup Time 05/11/2012 22:55   Final    Culture NO GROWTH 5 DAYS   Final    Report Status 05/17/2012 FINAL   Final  CULTURE, BLOOD (ROUTINE X 2)     Status: Normal   Collection Time   05/11/12  4:25 PM      Component Value Range Status Comment   Specimen Description BLOOD LEFT HAND   Final    Special Requests BOTTLES DRAWN AEROBIC AND ANAEROBIC 10CC   Final    Culture  Setup Time 05/11/2012 22:55   Final    Culture NO GROWTH 5 DAYS   Final    Report Status 05/17/2012 FINAL   Final   AFB CULTURE, BLOOD     Status: Normal (Preliminary result)   Collection Time   05/12/12 12:33 PM      Component Value Range Status Comment   Specimen Description BLOOD LEFT ARM   Final    Special Requests BOTTLES DRAWN AEROBIC ONLY 3CC   Final    Culture     Final    Value: CULTURE WILL BE EXAMINED FOR 6 WEEKS BEFORE ISSUING A FINAL REPORT   Report Status PENDING   Incomplete   MRSA PCR SCREENING     Status: Normal   Collection Time   05/12/12  3:35 PM      Component Value Range Status Comment   MRSA by PCR NEGATIVE  NEGATIVE Final   CULTURE, EXPECTORATED SPUTUM-ASSESSMENT     Status: Normal   Collection Time   05/12/12  5:04 PM      Component  Value Range Status Comment   Specimen Description SPUTUM   Final    Special Requests NONE   Final    Sputum evaluation     Final    Value: MICROSCOPIC FINDINGS SUGGEST THAT THIS SPECIMEN IS NOT REPRESENTATIVE OF LOWER RESPIRATORY SECRETIONS. PLEASE RECOLLECT.     CALLED TO MATTHEWS,M RN 05/12/12 1745 WOOTEN,K   Report Status 05/12/2012 FINAL   Final   STOOL CULTURE     Status: Normal   Collection Time   05/12/12  7:01 PM      Component Value Range Status Comment   Specimen Description STOOL   Final    Special Requests Immunocompromised   Final    Culture     Final    Value: NO SALMONELLA, SHIGELLA, CAMPYLOBACTER, YERSINIA, OR E.COLI 0157:[id number] ISOLATED     Note: REDUCED NORMAL FLORA PRESENT   Report Status 05/16/2012 FINAL   Final   OVA AND PARASITE EXAMINATION     Status: Normal   Collection Time   05/12/12  7:01 PM      Component Value Range Status Comment   Specimen Description STOOL   Final    Special Requests NONE   Final    Ova and parasites NO OVA OR PARASITES SEEN   Final    Report Status 05/15/2012 FINAL   Final   CRYPTOSPORIDIUM SMEAR, FECAL     Status: Normal   Collection Time   05/12/12  7:01 PM      Component Value Range Status Comment   Specimen Description STOOL   Final    Special Requests NONE   Final    Cryptosporidium Smear. NO Cryptosporidium Cyclospora or Isospora seen.   Final    Report Status 05/14/2012 FINAL   Final   AFB CULTURE, BLOOD     Status: Normal (Preliminary result)   Collection Time   05/13/12  9:35 AM      Component Value Range Status Comment   Specimen Description BLOOD LEFT ARM   Final    Special Requests 5CC   Final  Culture     Final    Value: CULTURE WILL BE EXAMINED FOR 6 WEEKS BEFORE ISSUING A FINAL REPORT   Report Status PENDING   Incomplete   CULTURE, EXPECTORATED SPUTUM-ASSESSMENT     Status: Normal   Collection Time   05/13/12 11:03 AM      Component Value Range Status Comment   Specimen Description SPUTUM   Final    Special  Requests Immunocompromised   Final    Sputum evaluation     Final    Value: MICROSCOPIC FINDINGS SUGGEST THAT THIS SPECIMEN IS NOT REPRESENTATIVE OF LOWER RESPIRATORY SECRETIONS. PLEASE RECOLLECT.     Gram Stain Report Called to,Read Back By and Verified With: Ernestina Penna RN 12:05 05/13/12 ([email or web address])   Report Status 05/13/2012 FINAL   Final   FUNGUS CULTURE W SMEAR     Status: Normal (Preliminary result)   Collection Time   05/13/12  4:41 PM      Component Value Range Status Comment   Specimen Description TISSUE ESOPHAGUS   Final    Special Requests NONE   Final    Fungal Smear NO YEAST OR FUNGAL ELEMENTS SEEN   Final    Culture CULTURE IN PROGRESS FOR FOUR WEEKS   Final    Report Status PENDING   Incomplete   VIRAL CULTURE VIRC     Status: Normal (Preliminary result)   Collection Time   05/13/12  4:41 PM      Component Value Range Status Comment   Specimen Description TISSUE ESOPHAGUS LOOK FOR CMV AND HERPES   Final    Special Requests NONE   Final    Culture Culture has been initiated.   Final    Report Status PENDING   Incomplete   CLOSTRIDIUM DIFFICILE BY PCR     Status: Normal   Collection Time   05/14/12  3:53 PM      Component Value Range Status Comment   C difficile by pcr NEGATIVE  NEGATIVE Final   FUNGUS CULTURE W SMEAR     Status: Normal (Preliminary result)   Collection Time   05/15/12  3:54 PM      Component Value Range Status Comment   Specimen Description TISSUE   Final    Special Requests     Final    Value: SAMPLE NO 1 SPECIMEN IN CUP TRANSVERSE AND DESC COLON   Fungal Smear NO YEAST OR FUNGAL ELEMENTS SEEN   Final    Culture CULTURE IN PROGRESS FOR FOUR WEEKS   Final    Report Status PENDING   Incomplete   CSF CULTURE     Status: Normal (Preliminary result)   Collection Time   05/16/12  5:20 PM      Component Value Range Status Comment   Specimen Description CSF   Final    Special Requests TUBE 2@6 .5CC   Final    Gram Stain     Final    Value: CYTOSPIN WBC  PRESENT,BOTH PMN AND MONONUCLEAR     NO ORGANISMS SEEN     Performed at [facility]   Culture PENDING   Incomplete    Report Status PENDING   Incomplete   GRAM STAIN     Status: Normal   Collection Time   05/16/12  5:20 PM      Component Value Range Status Comment   Specimen Description CSF   Final    Special Requests TUBE 2@6 .5CC   Final    Gram Stain  Final    Value: CYTOSPIN SLIDE     NO ORGANISMS SEEN     WBC PRESENT,BOTH PMN AND MONONUCLEAR   Report Status 05/16/2012 FINAL   Final     Studies/Results: US Renal  05/15/2012  *RADIOLOGY REPORT*  Clinical Data:   HIV positive.  Oliguria.  RENAL / URINARY TRACT ULTRASOUND  Technique:  Complete ultrasound exam of the kidneys and urinary bladder was performed.  Comparison: CT scan from 05/12/2012  Findings:  The right kidney measures 11.8 cm in long axis.  The left kidney measures 12.1 cm.  No evidence for hydronephrosis.  No renal mass. Both kidneys show increased renal cortical echogenicity.  Midline imaging of the pelvis reveals a normal appearing urinary bladder.  Impression:  Bilateral echogenic kidneys, consistent with medical renal disease. No evidence for hydronephrosis.  Original Report Authenticated By: ERIC A. MANSELL, M.D.   Dg Fluoro Guide Lumbar Puncture  05/17/2012  *RADIOLOGY REPORT*  Clinical Data: HIV. Fever.  Nausea and vomiting.  LUMBAR PUNCTURE FLUORO GUIDE  Comparison: None.  Findings: After explaining the purpose, procedure, risks, including bleeding, headache, infection, and medication reaction, and after obtaining written informed consent, using sterile technique and local anesthesia I inserted a 20 gauge spinal needle into the thecal sac at the L4-5 level using direct fluoroscopic visualization.  Clear colorless spinal fluid was obtained for laboratory examination.  Opening pressure was 25 cm of water.  Closing pressure was 14 cm of water.  A total of 28.5 ml of cerebrospinal fluid was sent to the lab for  testing.  Fluoroscopy time:  22 seconds.  IMPRESSION: The lumbar puncture performed without complication.  Original Report Authenticated By: Gwynn Burly, M.D.    Assessment/Plan:  Mr. Sens is a 31 yo male with, history of HIV (CD4 = 10, VL = 475K, 03/21/12), prior HBV presenting 8/10 with nausea, vomiting, SOB, and fevers, hospital course complicated by acute renal failure.  He still spikes fever with T max of 102.9 this morning. Plaletels continue to be low with count of 56 on 08/15.   Had a colonoscopy on 08/ 14 which showed normally appearing colon and multiple biopsies were taken.   Fluoro guided LP was done in the presence of Dr. Daiva Eves on 08/15 which has Opening pressure was 25 cm of water. Closing pressure was 14 cm of water. A total of 28.5 ml of cerebrospinal fluid was sent to the  lab for testing. Spinal fluid analysis showed normal glucose and protein levels with no RBC and 1 /cu mm WBC. CSF gram stain: no organisms found. Awaiting results for  CMV pcr, Cyrptococcal Ag, and culture on CSF fluid.  Donnajean Lopes,  Medical student 05/17/2012, 10:16 AM    INFECTIOUS DISEASE ATTENDING ADDENDUM:     [facility]   Date: 05/17/2012  Patient name: Zachary Hill  Medical record number: [id number]  Date of birth: April 01, 1981    This patient has been seen and discussed with the house staff. Please see their note for complete details. I concur with their findings with the following additions/corrections:  Very complicated HIV infected patient with FUO. We have been treating for M Avium empirically but pt still febrile and tachypnic. His EGD and colonoscopies were fairly unremarkable. His Colon bx shows minimal colitis. CMV, HSV viral cultures (PCRs would have been more rapid) are still pending. He has had CTabdomen and pelvis on admission that showed only possible enteritis. His LP had high opening  presssure but crypto ag negative and  CSF parameters unremarkable, further tests on CSF including CMV PCR, Toxo PCR, cultures are pending. MRI brain is without infectious pathology.  Fevers could be certainly due to HIV infection itself and I will now start his new ARV regimen.  I would consider repeating a CXR though it may be difficult to interpret with his recent renal failure, IVF and likely third spacing  I would continue empiric MAvium therapy and fluconazole for his thrush  He now has what appears to be hemolytic anemia and we are stopping dapsone, and changing to    He now has possible hemolytic anemia and we are stopping his dapsone and starting mepron  Dr. Ninetta Lights will be covering this weekend.  Acey Lav 05/17/2012, 5:38 PM

## 2012-05-17 NOTE — Progress Notes (Addendum)
[facility]  Subjective:  Still having diarrhea.  IV continues at 200cc/hr.  IV fluids not included on I/O sheet(?). T 102.7 at 7AM   Objective: Vital signs in last 24 hours: Blood pressure 128/81, pulse 107, temperature 102.7 F (39.3 C), temperature source Oral, resp. rate 36, height 5\' 6"  (1.676 m), weight 89.903 kg (198 lb 3.2 oz), SpO2 96.00%. No weight done since 10 Aug    PHYSICAL EXAM General--awake, sitting on bedside commode Chest--clear Heart--tachy, no rub Abd--nontender Extr--no edema  Lab Results:   Lab 05/17/12 0910 05/17/12 0506 05/16/12 0500 05/15/12 0425  NA 132* 134* 131* --  K 3.6 3.7 3.7 --  CL 98 101 102 --  CO2 21 19 15* --  BUN 59* 60* 58* --  CREATININE 6.10* 6.06* 6.41* --  ALB -- -- -- --  GLUCOSE 91 -- -- --  CALCIUM 7.4* 7.6* 7.5* --  PHOS -- 0.7* 1.9* 3.0     Basename 05/17/12 0506 05/16/12 0500  WBC 10.7* 7.0  HGB 7.7* 8.8*  HCT 21.7* 24.7*  PLT 91* 59*    Assessment/Plan: The patient is a 31 yo man, history of HIV (CD4 = 10, VL = 475K, 03/21/12), prior HBV (sAg neg, sAb pos, cAb pos, 03/21/12), presenting 8/10 with nausea, vomiting,diarrhea, SOB, and fevers, consulted for acute renal failure.  1.   Acute Renal Failure - Cr starting to decrease.  Proteinuria seen on UA.  Will check 24 hr urine pro/Cr -continue IVF at 200 cc/hr with bicarb and K,  Bicarb 21 and K 3.6 today 2.  Anemia - Patient has continued anemia, despite blood transfusion. No obvious source of bleeding on colonoscopy. Concern for hemolytic anemia (perhaps warm autoantibody HA? This can be seen in HIV). Ferritin = 1334 on 05/12/12. Saturation ratio 19, 05/15/12.  -managed per primary team, consider LDH, haptoglobin, coombs test  3.  Hypokalemia - likely secondary to GI losses  -currently being GN:[id number] by IVF--50 mEq KCl/liter @ 200 cc/hr = 240 mEq/day--need to check Mg 4.  Hypophosphatemia--phos 0.7 today.  Receiving IV Na-phos (30 mM over 6 hr) 5.   Thrombocytopenia - drop in platelets from 98 on admission to 34 today. Per HIT score, pt has low probability of HIT (score = 3/8). Other possibilities include HIV. Platelets now uptrending slightly.  -DIC panel negative  6.   HIV - CD4 = 10 --per primary team; ID recommends starting prezista, norvir, isentress, lamivudine  I'd also recommend check PA and lat CXR today    LOS: 6 days   Joaquin Knebel F 05/17/2012,10:55 AM   .labalb

## 2012-05-17 NOTE — Progress Notes (Signed)
Internal Medicine Attending  Date: 05/17/2012  Patient name: Zachary Hill Medical record number: [id number] Date of birth: 1981-03-19 Age: 31 y.o. Gender: male  I saw and evaluated the patient, and discussed his care on a.m. rounds with house staff.  We also discussed patient in detail with infectious disease consultant Dr. Daiva Eves.  Patient is still spiking fevers (MAXIMUM TEMPERATURE of 102.7), and remains tachypneic and tachycardic; exam shows clear lungs; regular, tachycardic, no murmur; soft nontender abdomen; 1+ bilateral lower extremity edema.  Labs are notable for creatinine of 6.10 (improved from 6.41 yesterday), WBC 10.7, hemoglobin 7.7, platelets 91, phosphorus 0.7; CSF studies so far are unremarkable.  Patient's acute renal failure, which is probably due to contrast-induced nephropathy, appears to be improving.  The source of his fevers, tachycardia, and tachypnea is still not clear.  Plans include continue empiric therapy for MAC; MRI of the brain today as per Dr. Algis Liming; if negative, he plans to institute antiretroviral therapy; check peripheral smear for evidence of hemolysis, as well as LDH, haptoglobin, and Coombs test given declining hemoglobin; stop dapsone since it may be contributing to his anemia and start atovaquone for PCP prophylaxis; low phosphorus was replaced by nephrology; would obtain a chest x-ray and Mg level as recommended by nephrology.

## 2012-05-17 NOTE — Progress Notes (Signed)
CRITICAL VALUE ALERT  Critical value received:  Phos 0.7  Date of notification:  05/17/12  Time of notification:  0630  Critical value read back:yes  Nurse who received alert:  A.Canary Brim RN  MD notified (1st page):  McTyre MD  Time of first page:  0630  MD notified (2nd page):  Time of second page:  Responding MD:  Lavena Bullion MD  Time MD responded:  [phone]

## 2012-05-17 NOTE — Progress Notes (Signed)
Subjective: He continues to be febrile with Tmax at 102.52F. He is having diarrhea again but no nausea or vomiting, he is hungry, asked for pickles this morning. His girlfriend is at his bedside.   He denies headache, neck pain or stiffness, chest pain, or blood in his stools.   Objective: Vital signs in last 24 hours: Filed Vitals:   05/17/12 0500 05/17/12 0800 05/17/12 1200 05/17/12 1600  BP:  128/81 125/66 132/65  Pulse:  107 113 131  Temp: 99.9 F (37.7 C) 102.7 F (39.3 C) 101.5 F (38.6 C) 102.8 F (39.3 C)  TempSrc: Oral Oral Oral Oral  Resp:  36 34 21  Height:      Weight:      SpO2:  96% 98% 97%   Weight change:   Intake/Output Summary (Last 24 hours) at 05/17/12 1716 Last data filed at 05/17/12 1600  Gross per 24 hour  Intake   3080 ml  Output   4575 ml  Net  -1495 ml   Vitals reviewed.  General: Sitting in bed, brushing his teeth, in NAD  HEENT: bilateral conjunctivitis, with focal vessel erythema of the medial right eye. No eye discharge, Pharyngitis with erythematous oropharynx but no exudates. No thrush. No cervical or axillary LAD.  Cardiac: tachycardic, no rubs, murmurs or gallops  Pulm: lung crackles up to mid lung fields, no wheezes, rales, or rhonchi  Abd: soft, mid epigastric tenderness, nondistended, BS present  Ext: warm and well perfused, no plantar or palmar rash. No rash noted on his abdomen, back, chest, UE or LE. He has  non-pitting edema in his LE, symmetric, bilaterally.  Neuro: alert and oriented X3, cranial nerves II-XII grossly intact, strength and sensation to light touch equal in bilateral upper and lower extremities   Lab Results: Basic Metabolic Panel:  Lab 05/17/12 4696 05/17/12 0910 05/17/12 0506 05/16/12 0500  NA -- 132* 134* --  K -- 3.6 3.7 --  CL -- 98 101 --  CO2 -- 21 19 --  GLUCOSE -- 91 106* --  BUN -- 59* 60* --  CREATININE -- 6.10* 6.06* --  CALCIUM -- 7.4* 7.6* --  MG 1.5 -- -- --  PHOS -- -- 0.7* 1.9*   Liver  Function Tests:  Lab 05/17/12 0506 05/16/12 0500 05/14/12 0530 05/12/12 0523  AST -- -- 77* 77*  ALT -- -- 67* 120*  ALKPHOS -- -- 133* 256*  BILITOT -- -- 0.9 1.7*  PROT -- -- 6.5 7.1  ALBUMIN 1.8* 2.0* -- --    Lab 05/11/12 1347  LIPASE 52  AMYLASE --   CBC:  Lab 05/17/12 0506 05/16/12 0500  WBC 10.7* 7.0  NEUTROABS 9.7* 5.9  HGB 7.7* 8.8*  HCT 21.7* 24.7*  MCV 75.1* 75.3*  PLT 91* 59*   D-Dimer:  Lab 05/14/12 0954  DDIMER 7.59*   Fasting Lipid Panel:  Lab 05/12/12 1932  CHOL 141  HDL 13*  LDLCALC 85  TRIG 295*  CHOLHDL 10.8  LDLDIRECT --   Coagulation:  Lab 05/14/12 0954  LABPROT 17.2*  INR 1.38   Anemia Panel:  Lab 05/15/12 0425 05/12/12 1234  VITAMINB12 -- --  FOLATE -- --  FERRITIN -- 1334*  TIBC 149* --  IRON 28* --  RETICCTPCT -- --   Urinalysis:  Lab 05/13/12 2249 05/11/12 1218  COLORURINE ORANGE* ORANGE*  LABSPEC 1.027 1.029  PHURINE 5.0 6.0  GLUCOSEU NEGATIVE NEGATIVE  HGBUR LARGE* NEGATIVE  BILIRUBINUR SMALL* SMALL*  KETONESUR 15* 15*  PROTEINUR 100* 100*  UROBILINOGEN 0.2 4.0*  NITRITE POSITIVE* NEGATIVE  LEUKOCYTESUR MODERATE* TRACE*    Micro Results: Recent Results (from the past 240 hour(s))  URINE CULTURE     Status: Normal   Collection Time   05/11/12 12:18 PM      Component Value Range Status Comment   Specimen Description URINE, CATHETERIZED   Final    Special Requests NONE   Final    Culture  Setup Time 05/11/2012 22:56   Final    Colony Count NO GROWTH   Final    Culture NO GROWTH   Final    Report Status 05/12/2012 FINAL   Final   CULTURE, BLOOD (ROUTINE X 2)     Status: Normal   Collection Time   05/11/12  4:10 PM      Component Value Range Status Comment   Specimen Description BLOOD LEFT ARM   Final    Special Requests BOTTLES DRAWN AEROBIC AND ANAEROBIC 10CC   Final    Culture  Setup Time 05/11/2012 22:55   Final    Culture NO GROWTH 5 DAYS   Final    Report Status 05/17/2012 FINAL   Final   CULTURE,  BLOOD (ROUTINE X 2)     Status: Normal   Collection Time   05/11/12  4:25 PM      Component Value Range Status Comment   Specimen Description BLOOD LEFT HAND   Final    Special Requests BOTTLES DRAWN AEROBIC AND ANAEROBIC 10CC   Final    Culture  Setup Time 05/11/2012 22:55   Final    Culture NO GROWTH 5 DAYS   Final    Report Status 05/17/2012 FINAL   Final   AFB CULTURE, BLOOD     Status: Normal (Preliminary result)   Collection Time   05/12/12 12:33 PM      Component Value Range Status Comment   Specimen Description BLOOD LEFT ARM   Final    Special Requests BOTTLES DRAWN AEROBIC ONLY 3CC   Final    Culture     Final    Value: CULTURE WILL BE EXAMINED FOR 6 WEEKS BEFORE ISSUING A FINAL REPORT   Report Status PENDING   Incomplete   MRSA PCR SCREENING     Status: Normal   Collection Time   05/12/12  3:35 PM      Component Value Range Status Comment   MRSA by PCR NEGATIVE  NEGATIVE Final   CULTURE, EXPECTORATED SPUTUM-ASSESSMENT     Status: Normal   Collection Time   05/12/12  5:04 PM      Component Value Range Status Comment   Specimen Description SPUTUM   Final    Special Requests NONE   Final    Sputum evaluation     Final    Value: MICROSCOPIC FINDINGS SUGGEST THAT THIS SPECIMEN IS NOT REPRESENTATIVE OF LOWER RESPIRATORY SECRETIONS. PLEASE RECOLLECT.     CALLED TO MATTHEWS,M RN 05/12/12 1745 WOOTEN,K   Report Status 05/12/2012 FINAL   Final   STOOL CULTURE     Status: Normal   Collection Time   05/12/12  7:01 PM      Component Value Range Status Comment   Specimen Description STOOL   Final    Special Requests Immunocompromised   Final    Culture     Final    Value: NO SALMONELLA, SHIGELLA, CAMPYLOBACTER, YERSINIA, OR E.COLI [id number]:H7 ISOLATED     Note: REDUCED NORMAL FLORA PRESENT  Report Status 05/16/2012 FINAL   Final   OVA AND PARASITE EXAMINATION     Status: Normal   Collection Time   05/12/12  7:01 PM      Component Value Range Status Comment   Specimen Description  STOOL   Final    Special Requests NONE   Final    Ova and parasites NO OVA OR PARASITES SEEN   Final    Report Status 05/15/2012 FINAL   Final   CRYPTOSPORIDIUM SMEAR, FECAL     Status: Normal   Collection Time   05/12/12  7:01 PM      Component Value Range Status Comment   Specimen Description STOOL   Final    Special Requests NONE   Final    Cryptosporidium Smear. NO Cryptosporidium Cyclospora or Isospora seen.   Final    Report Status 05/14/2012 FINAL   Final   AFB CULTURE, BLOOD     Status: Normal (Preliminary result)   Collection Time   05/13/12  9:35 AM      Component Value Range Status Comment   Specimen Description BLOOD LEFT ARM   Final    Special Requests 5CC   Final    Culture     Final    Value: CULTURE WILL BE EXAMINED FOR 6 WEEKS BEFORE ISSUING A FINAL REPORT   Report Status PENDING   Incomplete   CULTURE, EXPECTORATED SPUTUM-ASSESSMENT     Status: Normal   Collection Time   05/13/12 11:03 AM      Component Value Range Status Comment   Specimen Description SPUTUM   Final    Special Requests Immunocompromised   Final    Sputum evaluation     Final    Value: MICROSCOPIC FINDINGS SUGGEST THAT THIS SPECIMEN IS NOT REPRESENTATIVE OF LOWER RESPIRATORY SECRETIONS. PLEASE RECOLLECT.     Gram Stain Report Called to,Read Back By and Verified With: Ernestina Penna RN 12:05 05/13/12 ([email or web address])   Report Status 05/13/2012 FINAL   Final   FUNGUS CULTURE W SMEAR     Status: Normal (Preliminary result)   Collection Time   05/13/12  4:41 PM      Component Value Range Status Comment   Specimen Description TISSUE ESOPHAGUS   Final    Special Requests NONE   Final    Fungal Smear NO YEAST OR FUNGAL ELEMENTS SEEN   Final    Culture CULTURE IN PROGRESS FOR FOUR WEEKS   Final    Report Status PENDING   Incomplete   VIRAL CULTURE VIRC     Status: Normal   Collection Time   05/13/12  4:41 PM      Component Value Range Status Comment   Specimen Description TISSUE ESOPHAGUS LOOK FOR CMV AND HERPES    Final    Special Requests NONE   Final    Culture     Final    Value: No Herpes Simplex Detected in Cell Culture No Cytomegalovirus identified in cell culture                                                                A negative result does not exclude the possibility of CMV infection;inappropriate specimen      collection,storage and transport may lead to false negative  results.   Report Status 05/17/2012 FINAL   Final   CLOSTRIDIUM DIFFICILE BY PCR     Status: Normal   Collection Time   05/14/12  3:53 PM      Component Value Range Status Comment   C difficile by pcr NEGATIVE  NEGATIVE Final   FUNGUS CULTURE W SMEAR     Status: Normal (Preliminary result)   Collection Time   05/15/12  3:54 PM      Component Value Range Status Comment   Specimen Description TISSUE   Final    Special Requests     Final    Value: SAMPLE NO 1 SPECIMEN IN CUP TRANSVERSE AND DESC COLON   Fungal Smear NO YEAST OR FUNGAL ELEMENTS SEEN   Final    Culture CULTURE IN PROGRESS FOR FOUR WEEKS   Final    Report Status PENDING   Incomplete   VIRAL CULTURE VIRC     Status: Normal (Preliminary result)   Collection Time   05/15/12  3:54 PM      Component Value Range Status Comment   Specimen Description BIOPSY SIGMOID AND RECTAL LOOK FOR HSV AND CMV   Final    Special Requests NONE   Final    Culture Culture has been initiated.   Final    Report Status PENDING   Incomplete   CSF CULTURE     Status: Normal (Preliminary result)   Collection Time   05/16/12  5:20 PM      Component Value Range Status Comment   Specimen Description CSF   Final    Special Requests TUBE 2@6 .5CC   Final    Gram Stain     Final    Value: CYTOSPIN WBC PRESENT,BOTH PMN AND MONONUCLEAR     NO ORGANISMS SEEN     Performed at [facility]   Culture NO GROWTH 1 DAY   Final    Report Status PENDING   Incomplete   GRAM STAIN     Status: Normal   Collection Time   05/16/12  5:20 PM      Component Value Range Status Comment    Specimen Description CSF   Final    Special Requests TUBE 2@6 .5CC   Final    Gram Stain     Final    Value: CYTOSPIN SLIDE     NO ORGANISMS SEEN     WBC PRESENT,BOTH PMN AND MONONUCLEAR   Report Status 05/16/2012 FINAL   Final    Studies/Results: Mr Brain Wo Contrast  05/17/2012  *RADIOLOGY REPORT*  Clinical Data: 31 year old male with headache, fever, HIV. Renal insufficiency precludes contrast.  MRI HEAD WITHOUT CONTRAST  Technique:  Multiplanar, multiecho pulse sequences of the brain and surrounding structures were obtained according to standard protocol without intravenous contrast.  Comparison: Head CT without contrast 05/12/2012.  Findings: Susceptibility artifact on diffusion, T1, and T2* imaging suggesting parenteral iron. Subsequently, the T1 images on this exam a.  No contrast was administered (but no contrast was given).  The appearance is also felt to explain occasional areas of heterogeneous increased diffusion signal.  Normal cerebral volume.  No ventriculomegaly. No midline shift, mass effect, or evidence of mass lesion.  Wallace Cullens and white matter signal is within normal limits throughout the brain.  No areas suspicious for infarct. Major intracranial vascular flow voids are preserved.  Grossly negative visualized cervical spine. No acute intracranial hemorrhage identified.  Scattered paranasal sinus mucosal thickening.  Mild to moderate fluid signal in the  mastoid air cells.  A small volume of retained secretions in the nasopharynx.  Visualized orbit soft tissues are within normal limits.  Visualized bone marrow signal is within normal limits. Negative scalp soft tissues.  IMPRESSION: 1.  Artifact on multiple sequences suggesting recent parenteral iron or iron overload (reportedly this patient may have recently had multiple transfusions). 2.  Otherwise negative noncontrast MRI appearance of the brain. Renal insufficiency precludes contrast. 3.  Mild to moderate paranasal sinus and mastoid  inflammatory changes, clinical significance is unclear.  The mastoid findings likely are sterile effusions.  Original Report Authenticated By: Harley Hallmark, M.D.   Dg Fluoro Guide Lumbar Puncture  05/17/2012  *RADIOLOGY REPORT*  Clinical Data: HIV. Fever.  Nausea and vomiting.  LUMBAR PUNCTURE FLUORO GUIDE  Comparison: None.  Findings: After explaining the purpose, procedure, risks, including bleeding, headache, infection, and medication reaction, and after obtaining written informed consent, using sterile technique and local anesthesia I inserted a 20 gauge spinal needle into the thecal sac at the L4-5 level using direct fluoroscopic visualization.  Clear colorless spinal fluid was obtained for laboratory examination.  Opening pressure was 25 cm of water.  Closing pressure was 14 cm of water.  A total of 28.5 ml of cerebrospinal fluid was sent to the lab for testing.  Fluoroscopy time:  22 seconds.  IMPRESSION: The lumbar puncture performed without complication.  Original Report Authenticated By: Gwynn Burly, M.D.   Medications: I have reviewed the patient's current medications. Scheduled Meds:   . atovaquone  1,500 mg Oral Q breakfast  . azithromycin  600 mg Oral Daily  . ethambutol  15 mg/kg Oral Daily  . fluconazole (DIFLUCAN) IV  100 mg Intravenous Q24H  . pantoprazole  40 mg Oral Q1200  . senna  1 tablet Oral BID  . sodium phosphate  Dextrose 5% IVPB  30 mmol Intravenous Once  . DISCONTD: dapsone  100 mg Oral Daily  . DISCONTD: pantoprazole (PROTONIX) IV  40 mg Intravenous Q24H   Continuous Infusions:   . dextrose 5 % 1,000 mL with potassium chloride 50 mEq, sodium bicarbonate 150 mEq infusion 200 mL/hr at 05/17/12 1600   PRN Meds:.acetaminophen, acetaminophen, ondansetron (ZOFRAN) IV, ondansetron, promethazine, DISCONTD: ibuprofen Assessment/Plan: 51 man with HIV now AIDS, off ART for a year and now with CD4 < 10. Back with Dr. Drue Second and on dapsone, azithromycin, and diflucan  pending ADAP to resume ART. Adm for tepm of 103 w/o localizing site of infection. Has thrush but on rx for > a week.   1. SIRS/ Fever: Given CD4 count 10 concern for sepsis from MAC versus bacteremia is valid. No acute evidence for pneumonia even with repeat CXR in AM after fluids to rule out PCP. Urine unremarkable. Blood cultures times 2 drawn in ED. Vanc and Zosyn started on eve of 8/10. Pt with persistent fever, tachycardia, tacpnea on 8/11 AM. New headache on admission but no signs or symptoms concerning for meningitis or encephalitis. Pt also with pharyngitis x4 days, N/V/D x2 days on admission. Repeat CT head without contrast negative for acute changes, CT abdomen without contrast showing mild bowel inflammation with reactive lymph nodes. Zosyn stopped on eve of 8/11, pt started on Rocephin and flagyl IV that night. Pt was on dapsone 100mg  daily for PCP prophylaxis until 8/16, this was discontinued 2/2 concerns for dapsone-induced hemolytic anemia. He continues on Ethambutol for MAC empiric tx, fluconazole 200mg  IV for prophylaxis but now decreased to 100mg  2/2 AKI. Discontinued Vanc on  8/12 2/2 AKI. WBC up from 8.9 to 14.8 on 8/13, trended down but now up again at 10.7. Pt febrile all day today with Tmax of 102.57F.  Discontinued flagyl 8/13.  - Continue IVF @ - LP via IR with opening pressure of 25 cm of water. CSF clear, with low glucose (42), normal protein, 1 WBC.Cryptococcus Ab negative, CSF culture pending, no organisms seen on G Stain.  - MRI head without contrast without negative  - Follow up on cultures  - LDH normal  -Toxoplasma antibody: negative  -CMV culture: pending  -CMV blood DNA: detected - CMV Ab IgG>6.5 -AFB culture: pending  -Blood cultures: pending  -Herpes simplex cx: pending  -Fungal culture with smear: negative -Respiratory culture: not of lower respiratory airway  -Stool culture: no salmonella, shigella, campylobacter, Yersinia, or E. coli 0157:H7 isolated. Reduced  normal flora.  -Urine culture: NGTD  -Ova and parasite: negative - C diff stool PCR: negative on 8/13 - EGD biopsy: No yeast seen in tissue, No CMV or HSV seen in tissue culture,  - Colonoscopy biopsy: no yeast of fungal elements seen, HSV, CMV viral culture pending.    2. Nausea/vomiting: Concern for bacterial vs viral illness given weakened immune system. CXR unremarkable and US abdomen unremarkable. Pt with reported vomiting overnight. EGD unremarkable for CMV, HSV, candida esophagitis but viral cultures pending. Colonoscopy negative for CMV/HSV, but cultures pending. He reports no N/V since evening of 8/14.  - Advanced diet to renal, but patient eating foods brought from home.  - D5 @ 239ml/hr with KCL and  bicarbonate per renal, will decreased per recommendations.   3. Acute kidney injury. Pt with creatinine up from ~1.4 to 3.99-->4.85-->6.4 peak. Down to 6.06 today. Vanc level 41 (trough) on 8/13 . Random van 20.2 on 8/14. UA dirty, with granular casts but FeNa 0.6%. This could be 2/2 Vanc superimposed on mild prerenal azotemia 2/2 emesis, decreased PO v. IV dye contrast toxicity. Vancomycin stopped on 8/13. Decreased Lovenox to 30mg  SQ (Cr Cl <30) on 8/13.  Decreased fluconazole to 100mg  q24h, (Cr Cl <50). Pt had NS 1L boluses x(3) on 8/13 and hydration with NS at 262ml/h. Total input ~4.5L on 8/12. IVF fluids at 200cc/h , total output not fully recorded 2/2 pt voiding with diarrhea. Pt was oliguric on 8/14 with renal US showing no hydronephrosis or obstruction but increased renal cortical echogenicity. He had urine output of 1345 ml today @0 .6/ml/hr. He has bibasilar crackles up to his mid lung fields but no shortness of breath.  -MIVF changed to D5 at 263ml/h with KCL 27mEQ/L and bicarb (per renal)  - Mg normal at 1.5 but might need repletion.  -Phosphorus critically low this morning at 0.7., repleted with sodium phosphate in D5 , per renal -Continue trending Creatinine    -Condom cath for urine output  -Renal consulted, appreciate recommendations   4. Acute on chronic Anemia. Hg dropped from 9.6 on 8/14-->8.8-->7.7.  Pt denies blood in the stools, dark tarry stools, hematuria, increased abdominal pain, or shortness of breath. Peripheral blood smear showing target cells and increased bands. Haptoglobin increased at 346, LDH elevated at 546, This is concerning for dapsone-induced hemolytic anemia. Dapsone discontinued on 8/16, pt started on atovaquone 1,500mg  for PCP prophylaxis. Direct Coombs test positive.  - Will consider adding folic acid and vitamin E therapy   5. Thrombocytopenia. Pt with platelets at 98 on admission, trended down to 83, now 43-->30 on 8/14. Concern for DIC thought could also be  explained by AIDS and sepsis. Pt with no blood per rectum, has had sputum with blood smear.  -DIC panel with no schistocytes, elevated D-dimer but elevated fibrinogen. HIT score 3/8. Received 1 unit of platelets stat on 8/14 pre-colonoscopy. Platelets at 59 post transfusion and at 91 today. No bleeding reported per patient.  -Continue monitoring.   6. Pharyngitis. Pt with hoarseness today. Pt with pharyngitis >4 days on admission. Petechial pharyngitis with no exudates, no LAD. GI consulted for EGD. Preliminary report with no evidence for CMV or HSV esophagitis. Oropharynx with not thrush, but with edema and erythema.  -Viral and fungal cultures negative.   7. Diarrhea. Improved yesterday but worse today. Pt with at least 4 days of diarrhea on admission. CT abdomen showing reactive abdominal lymph nodes. Sent stool for giardia, cryptosporidium/microsporidium: pending. O& P negative. C. Diff PCR negative.  - Will repeat C. Diff PCR  8. AIDS: CD4 last 10, has been off ARVs for some time now and is in process of getting back on medications. No concern for IRIS as he has not started meds yet. Case manager to assist pt with emergency Medicaid for anti-retrovirals. Pt states  that only his pastor knows of his HIV status, will continue caution.  - PPX with atovaquone and azithromycin  - Tx for possible candidal esophagitis with fluconazole for 2 weeks started 7/30  - Per ID: emergency medicaid  - Will avoid truvada 2/2 nephrotoxicity  - Pt now s/p LP by IR, cryptococcus antigen negative. CSF cultures pending. CSF analysis with mildly low glucose but  - Per ID pt might resume ARV today. Appreciate recommendations.   9. Pruritis: resolved now. No rashes seen on physical exam. Sx could have been from Dapsone. Pt now off Dapsone.   10. DVT ppx: SCDs. Discontinued Lovenox 2/2 decreased platelets on 8/12. (From 83 to 43)      LOS: 6 days   Ky Barban 05/17/2012, 5:16 PM

## 2012-05-18 DIAGNOSIS — D592 Drug-induced nonautoimmune hemolytic anemia: Secondary | ICD-10-CM | POA: Diagnosis not present

## 2012-05-18 LAB — BASIC METABOLIC PANEL
BUN: 56 mg/dL — ABNORMAL HIGH (ref 6–23)
Calcium: 7.8 mg/dL — ABNORMAL LOW (ref 8.4–10.5)
Chloride: 96 mEq/L (ref 96–112)
Creatinine, Ser: 5.94 mg/dL — ABNORMAL HIGH (ref 0.50–1.35)
GFR calc Af Amer: 13 mL/min — ABNORMAL LOW (ref >90)
GFR calc non Af Amer: 11 mL/min — ABNORMAL LOW (ref >90)

## 2012-05-18 LAB — RENAL FUNCTION PANEL
CO2: 26 mEq/L (ref 19–32)
Calcium: 7.9 mg/dL — ABNORMAL LOW (ref 8.4–10.5)
GFR calc Af Amer: 14 mL/min — ABNORMAL LOW (ref >90)
GFR calc non Af Amer: 12 mL/min — ABNORMAL LOW (ref >90)
Glucose, Bld: 118 mg/dL — ABNORMAL HIGH (ref 70–99)
Phosphorus: 1.6 mg/dL — ABNORMAL LOW (ref 2.3–4.6)
Potassium: 3.3 mEq/L — ABNORMAL LOW (ref 3.5–5.1)
Sodium: 136 mEq/L (ref 135–145)

## 2012-05-18 LAB — CREATININE, URINE, 24 HOUR
Collection Interval-UCRE24: 24 hours
Urine Total Volume-UCRE24: 2000 mL

## 2012-05-18 LAB — CBC
HCT: 20.5 % — ABNORMAL LOW (ref 39.0–52.0)
MCH: 27.9 pg (ref 26.0–34.0)
MCHC: 35.6 g/dL (ref 30.0–36.0)
MCV: 78.2 fL (ref 78.0–100.0)
Platelets: 151 10*3/uL (ref 150–400)
RDW: 13.6 % (ref 11.5–15.5)

## 2012-05-18 LAB — PROTEIN, URINE, 24 HOUR: Collection Interval-UPROT: 24 hours

## 2012-05-18 LAB — CLOSTRIDIUM DIFFICILE BY PCR: Toxigenic C. Difficile by PCR: NEGATIVE

## 2012-05-18 MED ORDER — POTASSIUM CHLORIDE CRYS ER 20 MEQ PO TBCR
20.0000 meq | EXTENDED_RELEASE_TABLET | Freq: Two times a day (BID) | ORAL | Status: DC
  Administered 2012-05-18: 20 meq via ORAL
  Filled 2012-05-18 (×3): qty 1

## 2012-05-18 MED ORDER — POTASSIUM PHOSPHATE DIBASIC 3 MMOLE/ML IV SOLN
60.0000 meq | Freq: Once | INTRAVENOUS | Status: AC
  Administered 2012-05-18: 60 meq via INTRAVENOUS
  Filled 2012-05-18: qty 13.64

## 2012-05-18 MED ORDER — FUROSEMIDE 10 MG/ML IJ SOLN
40.0000 mg | Freq: Once | INTRAMUSCULAR | Status: AC
  Administered 2012-05-18: 40 mg via INTRAVENOUS
  Filled 2012-05-18: qty 4

## 2012-05-18 NOTE — Progress Notes (Signed)
Internal Medicine Teaching Service Attending Note Date: 05/18/2012  Patient name: Zachary Hill  Medical record number: [id number]  Date of birth: 1981/09/29    This patient has been seen and discussed with the house staff. Please see their note for complete details. I concur with their findings with the following additions/corrections: Mr Lamorte required supplemental O2 this AM 2/2 vol overload. His IVF were D/C'd and he was given one dose of lasix and has diuresed 1660 net negative. When I saw pt, he was off O2 and his resp status had improved. He had good air movement and no sig crackles on exam.  He remains febrile and is on tx for presumed MAC. HIV anti virals were started yesterday although I did not discuss whether he wsa tolerating them as he had a visitor and Dr Gwenlyn Fudge sticky note indicated only his godfather knew his HIV status.   He c/o a dry mouth likely related to his febrile state.    Namira Rosekrans 05/18/2012, 8:01 PM

## 2012-05-18 NOTE — Evaluation (Signed)
Physical Therapy Evaluation Patient Details Name: Zachary Hill MRN: [id number] DOB: Nov 17, 1980 Today's Date: 05/18/2012 Time: 8295-6213 PT Time Calculation (min): 27 min  PT Assessment / Plan / Recommendation Clinical Impression  Pt admitted with fever and sepsis with progression of HIV to AIDS. Pt currently with resting HR 115 and up to 130 with slow ambulation sats remained 90-95% on RA throughout tx. Pt will benefit from acute therapy to maximize transfers, mobility and gait prior to discharge to return pt to PLOF. Pt encouraged to ambulate daily with nursing with RW due to LOB with gait.     PT Assessment  Patient needs continued PT services    Follow Up Recommendations  No PT follow up    Barriers to Discharge None      Equipment Recommendations  Rolling walker with 5" wheels    Recommendations for Other Services     Frequency Min 3X/week    Precautions / Restrictions Precautions Precautions: Fall   Pertinent Vitals/Pain No pain      Mobility  Bed Mobility Bed Mobility: Not assessed Transfers Transfers: Sit to Stand;Stand to Sit Sit to Stand: 5: Supervision;From bed Stand to Sit: 5: Supervision;To chair/3-in-1 Details for Transfer Assistance: cueing for hand placement and increased time to complete Ambulation/Gait Ambulation/Gait Assistance: 4: Min assist Ambulation Distance (Feet): 150 Feet Assistive device: None Ambulation/Gait Assistance Details: Pt with min assist due to posterior LOB x 3 and assist to correct. Very slow cautious gait with cueing throughout for breathing techniique to increase depth and decrease rate. Step length less than foot length Gait Pattern: Step-through pattern;Decreased stride length;Narrow base of support Gait velocity: 49ft/57sec=.49ft /sec placing pt at increased fall risk Stairs: No    Exercises     PT Diagnosis: Difficulty walking  PT Problem List: Decreased range of motion;Decreased knowledge of use of DME;Decreased  activity tolerance;Decreased balance;Decreased mobility PT Treatment Interventions: Gait training;DME instruction;Functional mobility training;Therapeutic activities;Therapeutic exercise;Balance training;Patient/family education   PT Goals Acute Rehab PT Goals PT Goal Formulation: With patient Time For Goal Achievement: 06/01/12 Potential to Achieve Goals: Good Pt will go Supine/Side to Sit: with modified independence;with HOB 0 degrees PT Goal: Supine/Side to Sit - Progress: Goal set today Pt will go Sit to Stand: Independently PT Goal: Sit to Stand - Progress: Goal set today Pt will Ambulate: >150 feet;Independently PT Goal: Ambulate - Progress: Goal set today  Visit Information  Last PT Received On: 05/18/12 Assistance Needed: +1    Subjective Data  Subjective: 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 Patient Stated Goal: return home and to church   Prior Functioning  Home Living Lives With: Alone Available Help at Discharge: Friend(s) Type of Home: Apartment Home Access: Level entry Home Layout: One level Bathroom Shower/Tub: Engineer, manufacturing systems: Standard Home Adaptive Equipment: None Prior Function Level of Independence: Independent Able to Take Stairs?: Yes Driving: No Vocation: Unemployed Communication Communication: No difficulties    Cognition  Overall Cognitive Status: Appears within functional limits for tasks assessed/performed Arousal/Alertness: Awake/alert Orientation Level: Appears intact for tasks assessed Behavior During Session: [facility] for tasks performed    Extremity/Trunk Assessment Right Lower Extremity Assessment RLE ROM/Strength/Tone: Deficits RLE ROM/Strength/Tone Deficits: pt with bil LE edema and abdominal pain making hip flexion and figure 4 sitting difficult Left Lower Extremity Assessment LLE ROM/Strength/Tone: Deficits LLE ROM/Strength/Tone Deficits: pt with bil LE edema and abdominal pain making hip flexion and figure 4 sitting  difficult Trunk Assessment Trunk Assessment: Normal   Balance    End of  Session PT - End of Session Equipment Utilized During Treatment: Gait belt Activity Tolerance: Patient tolerated treatment well Patient left: Other (comment) (pt in bathroom with girlfriend present) Nurse Communication: Mobility status  GP     Delorse Lek 05/18/2012, 9:01 AM  Delaney Meigs, PT [phone]

## 2012-05-18 NOTE — Progress Notes (Signed)
[facility]  Subjective:  Awake, girlfriend at bedside.  No more nausea, still has duiarrhea, but "less." IV still at 200/hr   Objective: Vital signs in last 24 hours: Blood pressure 151/63, pulse 130, temperature 101.9 F (38.8 C), temperature source Oral, resp. rate 41, height 5\' 6"  (1.676 m), weight 104.1 kg (229 lb 8 oz), SpO2 92.00%.  Date  Weight 10 Aug  87 kg 17 Aug  104.1 kg (?)  PHYSICAL EXAM General--as above  Chest--clear Heart--no rub Abd--nontender Extr--1+ edema  Lab Results:   Lab 05/18/12 0630 05/17/12 2134 05/17/12 0910 05/17/12 0506  NA 136 136 132* --  K 3.3* 3.2* 3.6 --  CL 97 99 98 --  CO2 26 21 21  --  BUN 57* 57* 59* --  CREATININE 5.84* 6.00* 6.10* --  ALB -- -- -- --  GLUCOSE 118* -- -- --  CALCIUM 7.9* 7.7* 7.4* --  PHOS 1.6* 2.0* -- 0.7*     Basename 05/17/12 0506 05/16/12 0500  WBC 10.7* 7.0  HGB 7.7* 8.8*  HCT 21.7* 24.7*  PLT 91* 59*    Assessment/Plan: Mr. Covalt  is a 31 yo B man, history of HIV (CD4 = 10, VL = 475K, 03/21/12), prior HBV (sAg neg, sAb pos, cAb pos, 03/21/12), presenting 8/10 with nausea, vomiting,diarrhea, SOB, and fevers, consulted for acute renal failure.  1. Acute Renal Failure - Cr starting to decrease. Proteinuria seen on UA. Will check 24 hr urine pro/Cr.  Bicarb better.  Cr starting to fall (contrast plus toxic vanco levels)  -d/c IVF, replace K and phos.  Check Mg in AM 2. Anemia - Patient has continued anemia, despite blood transfusion. No obvious source of bleeding on colonoscopy. Concern for hemolytic anemia (perhaps warm autoantibody HA? This can be seen in HIV). Ferritin = 1334 on 05/12/12. Saturation ratio 19, 05/15/12.  -managed per primary team, consider LDH, haptoglobin, coombs test  3. Hypokalemia - likely secondary to GI losses.  Check Mg again tomorrow.  If Mg is <1.6 I'd suggest 2g MgSO4 over 12 hr.  My need more K than comes with K Phos  4. Hypophosphatemia--phos 1.6 today. To get K  Phos today 5. Thrombocytopenia - drop in platelets from 98 on admission to 34 today. Per HIT score, pt has low probability of HIT (score = 3/8). Other possibilities include HIV. Platelets now uptrending slightly.  -DIC panel negative  6. HIV - CD4 = 10 --per primary team; ID recommends starting prezista, norvir, isentress, lamivudine    LOS: 7 days   Mikolaj Woolstenhulme F 05/18/2012,10:43 AM   .[email or web address]

## 2012-05-18 NOTE — Progress Notes (Signed)
Subjective: "feeling like crap."  Still febrile with Tmax of 102.9 overnight.  Has been having more shortness of breath since yesterday and last night had to be placed on supplemental O2.  Weight up about 30 lbs since admission.  Diarrhea less frequent today.   Objective: Vital signs in last 24 hours: Filed Vitals:   05/18/12 0342 05/18/12 0649 05/18/12 0700 05/18/12 0855  BP:   151/63   Pulse: 117  114 130  Temp:   101.9 F (38.8 C)   TempSrc:   Oral   Resp: 42  41   Height:      Weight:  229 lb 8 oz (104.1 kg)    SpO2: 96%  95% 92%   Weight change:   Intake/Output Summary (Last 24 hours) at 05/18/12 1136 Last data filed at 05/18/12 0800  Gross per 24 hour  Intake   3720 ml  Output   4600 ml  Net   -880 ml   Vitals reviewed. General: resting in the chair, mild respiratory distress HEENT: PERRL, EOMI, no scleral icterus Cardiac: tachycardic but regular, no rubs, murmurs or gallops Pulm: bibasilar rales on exam today.  Tachypnea, no wheezes, or rhonchi Abd: soft, mild diffuse tenderness, nondistended, BS present Ext: warm and well perfused, 2+ pitting edema to the knees. edema Neuro: alert and oriented X3, cranial nerves II-XII grossly intact, strength and sensation to light touch equal in bilateral upper and lower extremities  Lab Results: Basic Metabolic Panel:  Lab 05/18/12 8119 05/17/12 2134 05/17/12 1230  NA 136 136 --  K 3.3* 3.2* --  CL 97 99 --  CO2 26 21 --  GLUCOSE 118* 110* --  BUN 57* 57* --  CREATININE 5.84* 6.00* --  CALCIUM 7.9* 7.7* --  MG 2.3 -- 1.5  PHOS 1.6* 2.0* --   Liver Function Tests:  Lab 05/18/12 0630 05/17/12 2134 05/14/12 0530 05/12/12 0523  AST -- -- 77* 77*  ALT -- -- 67* 120*  ALKPHOS -- -- 133* 256*  BILITOT -- -- 0.9 1.7*  PROT -- -- 6.5 7.1  ALBUMIN 2.0* 1.9* -- --    Lab 05/11/12 1347  LIPASE 52  AMYLASE --   CBC:  Lab 05/18/12 1030 05/17/12 0506 05/16/12 0500  WBC 12.4* 10.7* --  NEUTROABS -- 9.7* 5.9  HGB 7.3*  7.7* --  HCT 20.5* 21.7* --  MCV 78.2 75.1* --  PLT 151 91* --   D-Dimer:  Lab 05/14/12 0954  DDIMER 7.59*   Fasting Lipid Panel:  Lab 05/12/12 1932  CHOL 141  HDL 13*  LDLCALC 85  TRIG 147*  CHOLHDL 10.8  LDLDIRECT --   Coagulation:  Lab 05/14/12 0954  LABPROT 17.2*  INR 1.38   Anemia Panel:  Lab 05/17/12 0910 05/15/12 0425 05/12/12 1234  VITAMINB12 -- -- --  FOLATE -- -- --  FERRITIN -- -- 1334*  TIBC -- 149* --  IRON -- 28* --  RETICCTPCT 0.4 -- --   Urinalysis:  Lab 05/13/12 2249 05/11/12 1218  COLORURINE ORANGE* ORANGE*  LABSPEC 1.027 1.029  PHURINE 5.0 6.0  GLUCOSEU NEGATIVE NEGATIVE  HGBUR LARGE* NEGATIVE  BILIRUBINUR SMALL* SMALL*  KETONESUR 15* 15*  PROTEINUR 100* 100*  UROBILINOGEN 0.2 4.0*  NITRITE POSITIVE* NEGATIVE  LEUKOCYTESUR MODERATE* TRACE*   Misc. Labs: C. Diff: Neg DAT: Positive for complement LDH: 546 Haptoglobin: 346 Crypto CSF: Neg Crypto Blood: Neg CMV CSF: Pending Giardia/Cryptosporidium: Neg Colonoscopy HSV/CMV: Pending AM Cortisol 26.5 HLA B*5701: Pending Fibrinogen: 705 EGD HSV  and CMV: Neg Microsporidia: Neg O&P: Neg CMV IgG: Positive CMV DNA: Detected  Micro Results: Recent Results (from the past 240 hour(s))  URINE CULTURE     Status: Normal   Collection Time   05/11/12 12:18 PM      Component Value Range Status Comment   Specimen Description URINE, CATHETERIZED   Final    Special Requests NONE   Final    Culture  Setup Time 05/11/2012 22:56   Final    Colony Count NO GROWTH   Final    Culture NO GROWTH   Final    Report Status 05/12/2012 FINAL   Final   CULTURE, BLOOD (ROUTINE X 2)     Status: Normal   Collection Time   05/11/12  4:10 PM      Component Value Range Status Comment   Specimen Description BLOOD LEFT ARM   Final    Special Requests BOTTLES DRAWN AEROBIC AND ANAEROBIC 10CC   Final    Culture  Setup Time 05/11/2012 22:55   Final    Culture NO GROWTH 5 DAYS   Final    Report Status  05/17/2012 FINAL   Final   CULTURE, BLOOD (ROUTINE X 2)     Status: Normal   Collection Time   05/11/12  4:25 PM      Component Value Range Status Comment   Specimen Description BLOOD LEFT HAND   Final    Special Requests BOTTLES DRAWN AEROBIC AND ANAEROBIC 10CC   Final    Culture  Setup Time 05/11/2012 22:55   Final    Culture NO GROWTH 5 DAYS   Final    Report Status 05/17/2012 FINAL   Final   AFB CULTURE, BLOOD     Status: Normal (Preliminary result)   Collection Time   05/12/12 12:33 PM      Component Value Range Status Comment   Specimen Description BLOOD LEFT ARM   Final    Special Requests BOTTLES DRAWN AEROBIC ONLY 3CC   Final    Culture     Final    Value: CULTURE WILL BE EXAMINED FOR 6 WEEKS BEFORE ISSUING A FINAL REPORT   Report Status PENDING   Incomplete   MRSA PCR SCREENING     Status: Normal   Collection Time   05/12/12  3:35 PM      Component Value Range Status Comment   MRSA by PCR NEGATIVE  NEGATIVE Final   CULTURE, EXPECTORATED SPUTUM-ASSESSMENT     Status: Normal   Collection Time   05/12/12  5:04 PM      Component Value Range Status Comment   Specimen Description SPUTUM   Final    Special Requests NONE   Final    Sputum evaluation     Final    Value: MICROSCOPIC FINDINGS SUGGEST THAT THIS SPECIMEN IS NOT REPRESENTATIVE OF LOWER RESPIRATORY SECRETIONS. PLEASE RECOLLECT.     CALLED TO MATTHEWS,M RN 05/12/12 1745 WOOTEN,K   Report Status 05/12/2012 FINAL   Final   STOOL CULTURE     Status: Normal   Collection Time   05/12/12  7:01 PM      Component Value Range Status Comment   Specimen Description STOOL   Final    Special Requests Immunocompromised   Final    Culture     Final    Value: NO SALMONELLA, SHIGELLA, CAMPYLOBACTER, YERSINIA, OR E.COLI 0157:H7 ISOLATED     Note: REDUCED NORMAL FLORA PRESENT   Report Status 05/16/2012 FINAL  Final   OVA AND PARASITE EXAMINATION     Status: Normal   Collection Time   05/12/12  7:01 PM      Component Value Range  Status Comment   Specimen Description STOOL   Final    Special Requests NONE   Final    Ova and parasites NO OVA OR PARASITES SEEN   Final    Report Status 05/15/2012 FINAL   Final   CRYPTOSPORIDIUM SMEAR, FECAL     Status: Normal   Collection Time   05/12/12  7:01 PM      Component Value Range Status Comment   Specimen Description STOOL   Final    Special Requests NONE   Final    Cryptosporidium Smear. NO Cryptosporidium Cyclospora or Isospora seen.   Final    Report Status 05/14/2012 FINAL   Final   AFB CULTURE, BLOOD     Status: Normal (Preliminary result)   Collection Time   05/13/12  9:35 AM      Component Value Range Status Comment   Specimen Description BLOOD LEFT ARM   Final    Special Requests 5CC   Final    Culture     Final    Value: CULTURE WILL BE EXAMINED FOR 6 WEEKS BEFORE ISSUING A FINAL REPORT   Report Status PENDING   Incomplete   CULTURE, EXPECTORATED SPUTUM-ASSESSMENT     Status: Normal   Collection Time   05/13/12 11:03 AM      Component Value Range Status Comment   Specimen Description SPUTUM   Final    Special Requests Immunocompromised   Final    Sputum evaluation     Final    Value: MICROSCOPIC FINDINGS SUGGEST THAT THIS SPECIMEN IS NOT REPRESENTATIVE OF LOWER RESPIRATORY SECRETIONS. PLEASE RECOLLECT.     Gram Stain Report Called to,Read Back By and Verified With: Ernestina Penna RN 12:05 05/13/12 ([email or web address])   Report Status 05/13/2012 FINAL   Final   FUNGUS CULTURE W SMEAR     Status: Normal (Preliminary result)   Collection Time   05/13/12  4:41 PM      Component Value Range Status Comment   Specimen Description TISSUE ESOPHAGUS   Final    Special Requests NONE   Final    Fungal Smear NO YEAST OR FUNGAL ELEMENTS SEEN   Final    Culture CULTURE IN PROGRESS FOR FOUR WEEKS   Final    Report Status PENDING   Incomplete   VIRAL CULTURE VIRC     Status: Normal   Collection Time   05/13/12  4:41 PM      Component Value Range Status Comment   Specimen Description  TISSUE ESOPHAGUS LOOK FOR CMV AND HERPES   Final    Special Requests NONE   Final    Culture     Final    Value: No Herpes Simplex Detected in Cell Culture No Cytomegalovirus identified in cell culture                                                                A negative result does not exclude the possibility of CMV infection;inappropriate specimen      collection,storage and transport may lead to false negative results.   Report Status 05/17/2012  FINAL   Final   CLOSTRIDIUM DIFFICILE BY PCR     Status: Normal   Collection Time   05/14/12  3:53 PM      Component Value Range Status Comment   C difficile by pcr NEGATIVE  NEGATIVE Final   FUNGUS CULTURE W SMEAR     Status: Normal (Preliminary result)   Collection Time   05/15/12  3:54 PM      Component Value Range Status Comment   Specimen Description TISSUE   Final    Special Requests     Final    Value: SAMPLE NO 1 SPECIMEN IN CUP TRANSVERSE AND DESC COLON   Fungal Smear NO YEAST OR FUNGAL ELEMENTS SEEN   Final    Culture CULTURE IN PROGRESS FOR FOUR WEEKS   Final    Report Status PENDING   Incomplete   VIRAL CULTURE VIRC     Status: Normal (Preliminary result)   Collection Time   05/15/12  3:54 PM      Component Value Range Status Comment   Specimen Description BIOPSY SIGMOID AND RECTAL LOOK FOR HSV AND CMV   Final    Special Requests NONE   Final    Culture Culture has been initiated.   Final    Report Status PENDING   Incomplete   CSF CULTURE     Status: Normal (Preliminary result)   Collection Time   05/16/12  5:20 PM      Component Value Range Status Comment   Specimen Description CSF   Final    Special Requests TUBE 2@6 .5CC   Final    Gram Stain     Final    Value: CYTOSPIN WBC PRESENT,BOTH PMN AND MONONUCLEAR     NO ORGANISMS SEEN     Performed at [facility]   Culture NO GROWTH 1 DAY   Final    Report Status PENDING   Incomplete   GRAM STAIN     Status: Normal   Collection Time   05/16/12  5:20 PM       Component Value Range Status Comment   Specimen Description CSF   Final    Special Requests TUBE 2@6 .5CC   Final    Gram Stain     Final    Value: CYTOSPIN SLIDE     NO ORGANISMS SEEN     WBC PRESENT,BOTH PMN AND MONONUCLEAR   Report Status 05/16/2012 FINAL   Final   CLOSTRIDIUM DIFFICILE BY PCR     Status: Normal   Collection Time   05/17/12  7:09 PM      Component Value Range Status Comment   C difficile by pcr NEGATIVE  NEGATIVE Final    Studies/Results: Dg Chest 2 View  05/17/2012  *RADIOLOGY REPORT*  Clinical Data: Fever, cough.  CHEST - 2 VIEW  Comparison: May 13, 2012.  Findings: Minimal bilateral pleural effusions are noted. Cardiomediastinal silhouette appears normal.  No pneumonia or atelectasis is noted.  IMPRESSION: Minimal bilateral pleural effusions.  Original Report Authenticated By: Venita Sheffield., M.D.   Mr Brain Wo Contrast  05/17/2012  *RADIOLOGY REPORT*  Clinical Data: 31 year old male with headache, fever, HIV. Renal insufficiency precludes contrast.  MRI HEAD WITHOUT CONTRAST  Technique:  Multiplanar, multiecho pulse sequences of the brain and surrounding structures were obtained according to standard protocol without intravenous contrast.  Comparison: Head CT without contrast 05/12/2012.  Findings: Susceptibility artifact on diffusion, T1, and T2* imaging suggesting parenteral iron. Subsequently, the T1 images  on this exam a.  No contrast was administered (but no contrast was given).  The appearance is also felt to explain occasional areas of heterogeneous increased diffusion signal.  Normal cerebral volume.  No ventriculomegaly. No midline shift, mass effect, or evidence of mass lesion.  Wallace Cullens and white matter signal is within normal limits throughout the brain.  No areas suspicious for infarct. Major intracranial vascular flow voids are preserved.  Grossly negative visualized cervical spine. No acute intracranial hemorrhage identified.  Scattered paranasal sinus  mucosal thickening.  Mild to moderate fluid signal in the mastoid air cells.  A small volume of retained secretions in the nasopharynx.  Visualized orbit soft tissues are within normal limits.  Visualized bone marrow signal is within normal limits. Negative scalp soft tissues.  IMPRESSION: 1.  Artifact on multiple sequences suggesting recent parenteral iron or iron overload (reportedly this patient may have recently had multiple transfusions). 2.  Otherwise negative noncontrast MRI appearance of the brain. Renal insufficiency precludes contrast. 3.  Mild to moderate paranasal sinus and mastoid inflammatory changes, clinical significance is unclear.  The mastoid findings likely are sterile effusions.  Original Report Authenticated By: Harley Hallmark, M.D.   Dg Fluoro Guide Lumbar Puncture  05/17/2012  *RADIOLOGY REPORT*  Clinical Data: HIV. Fever.  Nausea and vomiting.  LUMBAR PUNCTURE FLUORO GUIDE  Comparison: None.  Findings: After explaining the purpose, procedure, risks, including bleeding, headache, infection, and medication reaction, and after obtaining written informed consent, using sterile technique and local anesthesia I inserted a 20 gauge spinal needle into the thecal sac at the L4-5 level using direct fluoroscopic visualization.  Clear colorless spinal fluid was obtained for laboratory examination.  Opening pressure was 25 cm of water.  Closing pressure was 14 cm of water.  A total of 28.5 ml of cerebrospinal fluid was sent to the lab for testing.  Fluoroscopy time:  22 seconds.  IMPRESSION: The lumbar puncture performed without complication.  Original Report Authenticated By: Gwynn Burly, M.D.   Medications: I have reviewed the patient's current medications. Scheduled Meds:   . atovaquone  1,500 mg Oral Q breakfast  . azithromycin  600 mg Oral Daily  . darunavir  800 mg Oral Daily  . ethambutol  15 mg/kg Oral Daily  . fluconazole (DIFLUCAN) IV  100 mg Intravenous Q24H  . furosemide  40  mg Intravenous Once  . lamiVUDine  50 mg Oral Daily  . lamiVUDine  150 mg Oral Once  . magnesium sulfate 1 - 4 g bolus IVPB  4 g Intravenous Once  . pantoprazole  40 mg Oral Q1200  . potassium phosphate IVPB (mEq)  60 mEq Intravenous Once  . raltegravir  400 mg Oral BID  . ritonavir  100 mg Oral Daily  . senna  1 tablet Oral BID  . sodium phosphate  Dextrose 5% IVPB  30 mmol Intravenous Once   Continuous Infusions:   . DISCONTD: dextrose 5 % 1,000 mL with potassium chloride 50 mEq, sodium bicarbonate 150 mEq infusion 200 mL/hr at 05/18/12 0800   PRN Meds:.acetaminophen, acetaminophen, ondansetron (ZOFRAN) IV, ondansetron, promethazine  Assessment/Plan: 57 man with HIV now AIDS, off ART for a year and now with CD4 < 10. Back with Dr. Drue Second and on dapsone, azithromycin, and diflucan pending ADAP to resume ART prior to admission. Adm for temp of 103 w/o localizing site of infection. Has thrush but on rx for > a week.   1. SIRS/ Fever: On admission he had a fever  of ~103, was tachypneic, and was having diarrhea.  Given CD4 count 10 the differential diagnosis was very wide including PCP, Cryptococcus, Giardia, cryptosporidium, microsporidia, isospora, bacteremia, UTI, pneumonia, meningitis, or encephalitis.   No acute evidence for pneumonia even with repeat CXR in AM after fluids to rule out PCP. Urine unremarkable. Blood cultures times 2 drawn in ED. Vanc and Zosyn started on eve of 8/10. Pt with persistent fever, tachycardia, tacpnea on 8/11 AM. New headache on admission but no signs or symptoms concerning for meningitis or encephalitis including neck stiffness or altered mental status. Pt also with pharyngitis x4 days, N/V/D x2 days on admission. Repeat CT head without contrast negative for acute changes, CT abdomen without contrast showing mild bowel inflammation with reactive lymph nodes. Zosyn stopped on eve of 8/11, pt started on Rocephin and flagyl IV that night as well as continued on  Vancomycin.  He then developed acute renal failure and with the lack of evidence of bacterial infection we stopped all antibiotics including Discontinued Vanc on 8/12, other then dapsone.  He was started on empiric treatment for MAC with azithromycin and Ethambutol.  Pt was on dapsone 100mg  daily for PCP prophylaxis until 8/16, this was discontinued 2/2 concerns for dapsone-induced hemolytic anemia so he was switched to Atovaquone.   Currently he continues on Ethambutol and azithromycin for MAC empiric tx, fluconazole 100mg  IV for thrush prophylaxis. WBC has been trending up and down and on 05/18/12 is up to 12.4.  The patient continues to be febrile with most Tmax running in the 102 range.  LP via IR with opening pressure of 25 cm of water. CSF clear, with low glucose (42), normal protein, 1 WBC.Cryptococcus Ab negative, CSF culture pending, no organisms seen on G Stain. MRI head without contrast was negative. He was started on ART therapy by Dr. Daiva Eves on 05/17/12.   - LDH normal  -Toxoplasma antibody: negative  -CMV culture: pending  -CMV blood DNA: detected  - CMV Ab IgG>6.5  -AFB culture: pending  -Blood cultures: pending  -Herpes simplex cx: pending  -Fungal culture with smear: negative  -Respiratory culture: not of lower respiratory airway  -Stool culture: no salmonella, shigella, campylobacter, Yersinia, or E. coli 0157:H7 isolated. Reduced normal flora.  -Urine culture: NGTD  -Ova and parasite: negative  - C diff stool PCR: negative on 8/13  - EGD biopsy: No yeast seen in tissue, No CMV or HSV seen in tissue culture,  - Colonoscopy biopsy: no yeast of fungal elements seen, HSV, CMV viral culture pending.  Plan:   - Follow up on cultures   - Continue to follow fever course.  - Consider redrawing aerobic and anaerobic blood cultures.  2. Nausea/vomiting: Concern for bacterial vs viral illness given weakened immune system. CXR unremarkable and US abdomen unremarkable. EGD unremarkable for  CMV, HSV, candida esophagitis but viral cultures pending. Colonoscopy negative for CMV/HSV, but cultures pending. He reports no N/V since evening of 8/14.   - Advanced diet to renal, but patient eating foods brought from home.    3. Acute kidney injury. On admission his creatinine was 0.91.  This had trended up to 1.14 the day after admission likely secondary to increased insensible losses from tachypnea and fever as well as vomiting and diarrhea.  He then received IV dye for his CT of abd and pelvis.  The next morning his creatinine was up to 3.25.  This has continue to trend upward to a peak of 6.30 on 8/14.  Vanc  level 41 (trough) on 8/13 . Random van 20.2 on 8/14. UA dirty, with granular casts but FeNa 0.6%. Likely cause is IV dye nephropathy in a setting of dehydration as well as a supratherapeutic Vancomycin level. Pt had NS 1L boluses x(3) on 8/13 and hydration with NS at 221ml/h. Condom cath was placed for strict I&O and the patient was oliguric on 8/14 with renal US showing no hydronephrosis or obstruction but increased renal cortical echogenicity. Since then his urine output has risen daily over the last few days with 1.5L out yesterday.  On 8/17 AM he states that he was short of breath and had to be placed on supplemental oxygen.  CXR showed bilateral pleural effusions and he is about 30 lbs up since admission.  He has bibasilar crackles up to his mid lung fields as well.    - Stop IV fluids  - Continue to supplement K with Kphos  - Lasix IV 40 mg once today  - Bmet at 6 pm to see if he needs more K  - Recheck Mg in AM  - renal doses of Lovenox to 30mg  SQ (Cr Cl <30) and fluconazole  -Continue trending Creatinine   -Condom cath for urine output   -Renal consulted, appreciate recommendations   4. Acute on chronic Anemia. On admission his HgB was 13.2 but dropped to 11.8 on the day after admission with fluids.  He has continued to decrease daily for the last few days from 12--> 10.8 --> 9.6  --> 8.8 --> 7.7 --> 7.3 today.  Pt denies blood in the stools, dark tarry stools, hematuria, increased abdominal pain, or shortness of breath. Peripheral blood smear showing target cells and increased bands. Haptoglobin increased at 346, LDH elevated at 546, This is concerning for dapsone-induced hemolytic anemia. Dapsone discontinued on 8/16, pt started on atovaquone 1,500mg  for PCP prophylaxis. Direct Coombs test positive.   - Continue to monitor CBC daily  - Transfuse if HgB less then 7.   - Will consider adding folic acid and vitamin E therapy   5. Thrombocytopenia. Pt with platelets at 98 on admission, trended down to 83, now 43-->30 on 8/14. Concern for DIC thought could also be explained by AIDS and sepsis. Pt with no blood per rectum, has had sputum with blood smear.  DIC panel with no schistocytes, elevated D-dimer but elevated fibrinogen. HIT score 3/8. Received 1 unit of platelets stat on 8/14 pre-colonoscopy. Platelets at 59 post transfusion and at 91 today. No bleeding reported per patient. Likely secondary to his HIV and bone marrow suppression.    -Continue monitoring.   6. Pharyngitis. Pt with hoarseness. Pt with pharyngitis >4 days on admission. Petechial pharyngitis with no exudates, no LAD. GI consulted for EGD. Preliminary report with no evidence for CMV or HSV esophagitis. Oropharynx with not thrush, but with edema and erythema.   -Viral and fungal cultures negative.   - Continue to monitor  7. Diarrhea. Improved yesterday but worse today. Pt with at least 4 days of diarrhea on admission. CT abdomen showing reactive abdominal lymph nodes. Sent stool for giardia, cryptosporidium/microsporidium is negative. O& P negative. C. Diff PCR negative.   - Continue to monitor.   8. AIDS: CD4 last 10, has been off ARVs for some time now and is in process of getting back on medications. No concern for IRIS as he has not started meds yet. Case manager to assist pt with emergency Medicaid for  anti-retrovirals. Pt states that only his pastor knows  of his HIV status, will continue caution. Dr. Daiva Eves following and has suggested Prezista, EPivir, Isentress, and Norvir which were started on 05/17/12.    - PPX with atovaquone and azithromycin and fluconazole  - Per ID: emergency medicaid   - Will avoid truvada 2/2 nephrotoxicity   9. Pruritis: resolved now. No rashes seen on physical exam. Sx could have been from Dapsone. Pt now off Dapsone.   10. Dispo: Patient to remain in SDU for now with increased SOB and continued fever.  Eating better.  Will be ready for D/C when afebrile for 24 hours and able to get his medications as an outpatient.    LOS: 7 days   [place] Blizzard 05/18/2012, 11:36 AM

## 2012-05-18 NOTE — Progress Notes (Signed)
INFECTIOUS DISEASE PROGRESS NOTE  ID: Zachary Hill is a 31 y.o. male with   Principal Problem:  *Fever Active Problems:  Human immunodeficiency virus (HIV) disease  Nausea & vomiting  Odynophagia  Diarrhea  Pharyngitis  Acute kidney injury  Thrombocytopenia  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  Subjective: C/o dry mouth, fever.   Abtx:  Anti-infectives     Start     Dose/Rate Route Frequency Ordered Stop   05/18/12 1900   lamiVUDine (EPIVIR) 10 MG/ML solution 50 mg        50 mg Oral Daily 05/17/12 1736     05/18/12 0700   atovaquone (MEPRON) 750 MG/5ML suspension 1,500 mg        1,500 mg Oral Daily with breakfast 05/17/12 1013     05/17/12 1900   Darunavir Ethanolate (PREZISTA) tablet 800 mg        800 mg Oral Daily 05/17/12 1736     05/17/12 1900   ritonavir (NORVIR) tablet 100 mg        100 mg Oral Daily 05/17/12 1736     05/17/12 1900   raltegravir (ISENTRESS) tablet 400 mg        400 mg Oral 2 times daily 05/17/12 1736     05/17/12 1900   lamiVUDine (EPIVIR) tablet 150 mg        150 mg Oral  Once 05/17/12 1736 05/17/12 2115   05/17/12 1000   azithromycin (ZITHROMAX) tablet 1,200 mg  Status:  Discontinued        1,200 mg Oral Every 7 days 05/11/12 2059 05/12/12 2247   05/17/12 0700   Darunavir Ethanolate (PREZISTA) tablet 800 mg  Status:  Discontinued        800 mg Oral Daily with breakfast 05/16/12 1054 05/16/12 1056   05/17/12 0700   ritonavir (NORVIR) tablet 100 mg  Status:  Discontinued        100 mg Oral Daily with breakfast 05/16/12 1054 05/16/12 1056   05/16/12 1100   lamiVUDine (EPIVIR) tablet 150 mg  Status:  Discontinued     Comments: PLEASE dose renally!!!!!! No option for consult pharmacy to dose.      150 mg Oral Daily 05/16/12 1054 05/16/12 1056   05/16/12 1100   raltegravir (ISENTRESS) tablet 400 mg  Status:  Discontinued        400 mg Oral 2 times daily 05/16/12 1054 05/16/12 1056   05/14/12 0800   fluconazole (DIFLUCAN) IVPB 100 mg        100 mg 50 mL/hr over 60 Minutes Intravenous Every 24 hours 05/14/12 0733     05/13/12 1000   azithromycin (ZITHROMAX) tablet 600 mg        600 mg Oral Daily 05/13/12 0857     05/13/12 0800   fluconazole (DIFLUCAN) IVPB 200 mg  Status:  Discontinued        200 mg 100 mL/hr over 60 Minutes Intravenous Every 24 hours 05/12/12 1758 05/14/12 0733   05/12/12 2359   ethambutol (MYAMBUTOL) tablet 15 mg/kg        15 mg/kg  89.9 kg Oral Daily 05/12/12 2247     05/12/12 2359   azithromycin (ZITHROMAX) tablet 500 mg  Status:  Discontinued        500 mg Oral Daily 05/12/12 2325 05/13/12 0857   05/12/12 2300   azithromycin (ZITHROMAX) tablet 250 mg  Status:  Discontinued        250 mg Oral Daily 05/12/12 2247 05/12/12 2324  05/12/12 2000   metroNIDAZOLE (FLAGYL) IVPB 500 mg  Status:  Discontinued        500 mg 100 mL/hr over 60 Minutes Intravenous Every 8 hours 05/12/12 1821 05/14/12 1040   05/12/12 1830   fluconazole (DIFLUCAN) IVPB 400 mg  Status:  Discontinued        400 mg 200 mL/hr over 60 Minutes Intravenous Every 24 hours 05/12/12 1744 05/12/12 1758   05/12/12 1830   cefTRIAXone (ROCEPHIN) 2 g in dextrose 5 % 50 mL IVPB  Status:  Discontinued        2 g 100 mL/hr over 30 Minutes Intravenous Every 24 hours 05/12/12 1821 05/14/12 0807   05/11/12 2200   fluconazole (DIFLUCAN) tablet 200 mg  Status:  Discontinued        200 mg Oral Daily 05/11/12 2059 05/12/12 1743   05/11/12 2200   dapsone tablet 100 mg  Status:  Discontinued        100 mg Oral Daily 05/11/12 2059 05/17/12 1013   05/11/12 2200   vancomycin (VANCOCIN) IVPB 1000 mg/200 mL premix  Status:  Discontinued        1,000 mg 200 mL/hr over 60 Minutes Intravenous Every 8 hours 05/11/12 2134 05/13/12 2058   05/11/12 2200   piperacillin-tazobactam (ZOSYN) IVPB 3.375 g  Status:  Discontinued        3.375 g 12.5 mL/hr over 240 Minutes Intravenous 3 times per day 05/11/12 2134 05/12/12 1821          Medications:  Scheduled:    . atovaquone  1,500 mg Oral Q breakfast  . azithromycin  600 mg Oral Daily  . darunavir  800 mg Oral Daily  . ethambutol  15 mg/kg Oral Daily  . fluconazole (DIFLUCAN) IV  100 mg Intravenous Q24H  . furosemide  40 mg Intravenous Once  . lamiVUDine  50 mg Oral Daily  . lamiVUDine  150 mg Oral Once  . magnesium sulfate 1 - 4 g bolus IVPB  4 g Intravenous Once  . pantoprazole  40 mg Oral Q1200  . potassium phosphate IVPB (mEq)  60 mEq Intravenous Once  . raltegravir  400 mg Oral BID  . ritonavir  100 mg Oral Daily  . senna  1 tablet Oral BID  . sodium phosphate  Dextrose 5% IVPB  30 mmol Intravenous Once    Objective: Vital signs in last 24 hours: Temp:  [101.9 F (38.8 C)-102.9 F (39.4 C)] 102.5 F (39.2 C) (08/17 1149) Pulse Rate:  [114-132] 130  (08/17 0855) Resp:  [21-45] 41  (08/17 0700) BP: (132-151)/(63-75) 151/63 mmHg (08/17 0700) SpO2:  [91 %-97 %] 92 % (08/17 0855) Weight:  [104.1 kg (229 lb 8 oz)] 104.1 kg (229 lb 8 oz) (08/17 0649)   General appearance: alert, cooperative, fatigued and mild distress Eyes: positive findings: conjunctiva: 2+ injection Throat: abnormal findings: mild pharyngeal thrush Neck: no adenopathy Resp: clear to auscultation bilaterally Cardio: tachycardia GI: normal findings: bowel sounds normal and soft, non-tender Extremities: edema 2-3+  Lab Results  Basename 05/18/12 1030 05/18/12 0630 05/17/12 2134 05/17/12 0506  WBC 12.4* -- -- 10.7*  HGB 7.3* -- -- 7.7*  HCT 20.5* -- -- 21.7*  NA -- 136 136 --  K -- 3.3* 3.2* --  CL -- 97 99 --  CO2 -- 26 21 --  BUN -- 57* 57* --  CREATININE -- 5.84* 6.00* --  GLU -- -- -- --   Liver Panel  Basename 05/18/12 0630 05/17/12  2134  PROT -- --  ALBUMIN 2.0* 1.9*  AST -- --  ALT -- --  ALKPHOS -- --  BILITOT -- --  BILIDIR -- --  IBILI -- --   Sedimentation Rate No results found for this basename: ESRSEDRATE in the last 72 hours C-Reactive Protein No results found for this  basename: CRP:2 in the last 72 hours  Microbiology: Recent Results (from the past 240 hour(s))  URINE CULTURE     Status: Normal   Collection Time   05/11/12 12:18 PM      Component Value Range Status Comment   Specimen Description URINE, CATHETERIZED   Final    Special Requests NONE   Final    Culture  Setup Time 05/11/2012 22:56   Final    Colony Count NO GROWTH   Final    Culture NO GROWTH   Final    Report Status 05/12/2012 FINAL   Final   CULTURE, BLOOD (ROUTINE X 2)     Status: Normal   Collection Time   05/11/12  4:10 PM      Component Value Range Status Comment   Specimen Description BLOOD LEFT ARM   Final    Special Requests BOTTLES DRAWN AEROBIC AND ANAEROBIC 10CC   Final    Culture  Setup Time 05/11/2012 22:55   Final    Culture NO GROWTH 5 DAYS   Final    Report Status 05/17/2012 FINAL   Final   CULTURE, BLOOD (ROUTINE X 2)     Status: Normal   Collection Time   05/11/12  4:25 PM      Component Value Range Status Comment   Specimen Description BLOOD LEFT HAND   Final    Special Requests BOTTLES DRAWN AEROBIC AND ANAEROBIC 10CC   Final    Culture  Setup Time 05/11/2012 22:55   Final    Culture NO GROWTH 5 DAYS   Final    Report Status 05/17/2012 FINAL   Final   AFB CULTURE, BLOOD     Status: Normal (Preliminary result)   Collection Time   05/12/12 12:33 PM      Component Value Range Status Comment   Specimen Description BLOOD LEFT ARM   Final    Special Requests BOTTLES DRAWN AEROBIC ONLY 3CC   Final    Culture     Final    Value: CULTURE WILL BE EXAMINED FOR 6 WEEKS BEFORE ISSUING A FINAL REPORT   Report Status PENDING   Incomplete   MRSA PCR SCREENING     Status: Normal   Collection Time   05/12/12  3:35 PM      Component Value Range Status Comment   MRSA by PCR NEGATIVE  NEGATIVE Final   CULTURE, EXPECTORATED SPUTUM-ASSESSMENT     Status: Normal   Collection Time   05/12/12  5:04 PM      Component Value Range Status Comment   Specimen Description SPUTUM    Final    Special Requests NONE   Final    Sputum evaluation     Final    Value: MICROSCOPIC FINDINGS SUGGEST THAT THIS SPECIMEN IS NOT REPRESENTATIVE OF LOWER RESPIRATORY SECRETIONS. PLEASE RECOLLECT.     CALLED TO MATTHEWS,M RN 05/12/12 1745 WOOTEN,K   Report Status 05/12/2012 FINAL   Final   STOOL CULTURE     Status: Normal   Collection Time   05/12/12  7:01 PM      Component Value Range Status Comment   Specimen Description  STOOL   Final    Special Requests Immunocompromised   Final    Culture     Final    Value: NO SALMONELLA, SHIGELLA, CAMPYLOBACTER, YERSINIA, OR E.COLI [id number]:[id number] ISOLATED     Note: REDUCED NORMAL FLORA PRESENT   Report Status 05/16/2012 FINAL   Final   OVA AND PARASITE EXAMINATION     Status: Normal   Collection Time   05/12/12  7:01 PM      Component Value Range Status Comment   Specimen Description STOOL   Final    Special Requests NONE   Final    Ova and parasites NO OVA OR PARASITES SEEN   Final    Report Status 05/15/2012 FINAL   Final   CRYPTOSPORIDIUM SMEAR, FECAL     Status: Normal   Collection Time   05/12/12  7:01 PM      Component Value Range Status Comment   Specimen Description STOOL   Final    Special Requests NONE   Final    Cryptosporidium Smear. NO Cryptosporidium Cyclospora or Isospora seen.   Final    Report Status 05/14/2012 FINAL   Final   AFB CULTURE, BLOOD     Status: Normal (Preliminary result)   Collection Time   05/13/12  9:35 AM      Component Value Range Status Comment   Specimen Description BLOOD LEFT ARM   Final    Special Requests 5CC   Final    Culture     Final    Value: CULTURE WILL BE EXAMINED FOR 6 WEEKS BEFORE ISSUING A FINAL REPORT   Report Status PENDING   Incomplete   CULTURE, EXPECTORATED SPUTUM-ASSESSMENT     Status: Normal   Collection Time   05/13/12 11:03 AM      Component Value Range Status Comment   Specimen Description SPUTUM   Final    Special Requests Immunocompromised   Final    Sputum evaluation     Final     Value: MICROSCOPIC FINDINGS SUGGEST THAT THIS SPECIMEN IS NOT REPRESENTATIVE OF LOWER RESPIRATORY SECRETIONS. PLEASE RECOLLECT.     Gram Stain Report Called to,Read Back By and Verified With: Ernestina Penna RN 12:05 05/13/12 ([email or web address])   Report Status 05/13/2012 FINAL   Final   FUNGUS CULTURE W SMEAR     Status: Normal (Preliminary result)   Collection Time   05/13/12  4:41 PM      Component Value Range Status Comment   Specimen Description TISSUE ESOPHAGUS   Final    Special Requests NONE   Final    Fungal Smear NO YEAST OR FUNGAL ELEMENTS SEEN   Final    Culture CULTURE IN PROGRESS FOR FOUR WEEKS   Final    Report Status PENDING   Incomplete   VIRAL CULTURE VIRC     Status: Normal   Collection Time   05/13/12  4:41 PM      Component Value Range Status Comment   Specimen Description TISSUE ESOPHAGUS LOOK FOR CMV AND HERPES   Final    Special Requests NONE   Final    Culture     Final    Value: No Herpes Simplex Detected in Cell Culture No Cytomegalovirus identified in cell culture  A negative result does not exclude the possibility of CMV infection;inappropriate specimen      collection,storage and transport may lead to false negative results.   Report Status 05/17/2012 FINAL   Final   CLOSTRIDIUM DIFFICILE BY PCR     Status: Normal   Collection Time   05/14/12  3:53 PM      Component Value Range Status Comment   C difficile by pcr NEGATIVE  NEGATIVE Final   FUNGUS CULTURE W SMEAR     Status: Normal (Preliminary result)   Collection Time   05/15/12  3:54 PM      Component Value Range Status Comment   Specimen Description TISSUE   Final    Special Requests     Final    Value: SAMPLE NO 1 SPECIMEN IN CUP TRANSVERSE AND DESC COLON   Fungal Smear NO YEAST OR FUNGAL ELEMENTS SEEN   Final    Culture CULTURE IN PROGRESS FOR FOUR WEEKS   Final    Report Status PENDING   Incomplete   VIRAL CULTURE VIRC     Status: Normal  (Preliminary result)   Collection Time   05/15/12  3:54 PM      Component Value Range Status Comment   Specimen Description BIOPSY SIGMOID AND RECTAL LOOK FOR HSV AND CMV   Final    Special Requests NONE   Final    Culture Culture has been initiated.   Final    Report Status PENDING   Incomplete   CSF CULTURE     Status: Normal (Preliminary result)   Collection Time   05/16/12  5:20 PM      Component Value Range Status Comment   Specimen Description CSF   Final    Special Requests TUBE 2@6 .5CC   Final    Gram Stain     Final    Value: CYTOSPIN WBC PRESENT,BOTH PMN AND MONONUCLEAR     NO ORGANISMS SEEN     Performed at [facility]   Culture NO GROWTH 1 DAY   Final    Report Status PENDING   Incomplete   GRAM STAIN     Status: Normal   Collection Time   05/16/12  5:20 PM      Component Value Range Status Comment   Specimen Description CSF   Final    Special Requests TUBE 2@6 .5CC   Final    Gram Stain     Final    Value: CYTOSPIN SLIDE     NO ORGANISMS SEEN     WBC PRESENT,BOTH PMN AND MONONUCLEAR   Report Status 05/16/2012 FINAL   Final   CLOSTRIDIUM DIFFICILE BY PCR     Status: Normal   Collection Time   05/17/12  7:09 PM      Component Value Range Status Comment   C difficile by pcr NEGATIVE  NEGATIVE Final     Studies/Results: Dg Chest 2 View  05/17/2012  *RADIOLOGY REPORT*  Clinical Data: Fever, cough.  CHEST - 2 VIEW  Comparison: May 13, 2012.  Findings: Minimal bilateral pleural effusions are noted. Cardiomediastinal silhouette appears normal.  No pneumonia or atelectasis is noted.  IMPRESSION: Minimal bilateral pleural effusions.  Original Report Authenticated By: Venita Sheffield., M.D.   Mr Brain Wo Contrast  05/17/2012  *RADIOLOGY REPORT*  Clinical Data: 31 year old male with headache, fever, HIV. Renal insufficiency precludes contrast.  MRI HEAD WITHOUT CONTRAST  Technique:  Multiplanar, multiecho pulse sequences of the brain and surrounding structures were  obtained according to standard protocol without intravenous contrast.  Comparison: Head CT without contrast 05/12/2012.  Findings: Susceptibility artifact on diffusion, T1, and T2* imaging suggesting parenteral iron. Subsequently, the T1 images on this exam a.  No contrast was administered (but no contrast was given).  The appearance is also felt to explain occasional areas of heterogeneous increased diffusion signal.  Normal cerebral volume.  No ventriculomegaly. No midline shift, mass effect, or evidence of mass lesion.  Wallace Cullens and white matter signal is within normal limits throughout the brain.  No areas suspicious for infarct. Major intracranial vascular flow voids are preserved.  Grossly negative visualized cervical spine. No acute intracranial hemorrhage identified.  Scattered paranasal sinus mucosal thickening.  Mild to moderate fluid signal in the mastoid air cells.  A small volume of retained secretions in the nasopharynx.  Visualized orbit soft tissues are within normal limits.  Visualized bone marrow signal is within normal limits. Negative scalp soft tissues.  IMPRESSION: 1.  Artifact on multiple sequences suggesting recent parenteral iron or iron overload (reportedly this patient may have recently had multiple transfusions). 2.  Otherwise negative noncontrast MRI appearance of the brain. Renal insufficiency precludes contrast. 3.  Mild to moderate paranasal sinus and mastoid inflammatory changes, clinical significance is unclear.  The mastoid findings likely are sterile effusions.  Original Report Authenticated By: Harley Hallmark, M.D.   Dg Fluoro Guide Lumbar Puncture  05/17/2012  *RADIOLOGY REPORT*  Clinical Data: HIV. Fever.  Nausea and vomiting.  LUMBAR PUNCTURE FLUORO GUIDE  Comparison: None.  Findings: After explaining the purpose, procedure, risks, including bleeding, headache, infection, and medication reaction, and after obtaining written informed consent, using sterile technique and local  anesthesia I inserted a 20 gauge spinal needle into the thecal sac at the L4-5 level using direct fluoroscopic visualization.  Clear colorless spinal fluid was obtained for laboratory examination.  Opening pressure was 25 cm of water.  Closing pressure was 14 cm of water.  A total of 28.5 ml of cerebrospinal fluid was sent to the lab for testing.  Fluoroscopy time:  22 seconds.  IMPRESSION: The lumbar puncture performed without complication.  Original Report Authenticated By: Gwynn Burly, M.D.     Assessment/Plan: AIDS SIRS/Fever ARF Thrombocytopenia, Anemia Suspected MAI (BCx 8-11/12) CMV DNA + (blood) Colonoscopy- minimal colitis (path - for organisms)  Candidal esophagitis  Total days of antibiotics 7     Azithro/Ethambutol   Day 6     Mepron    Day 5     Fluconazole    Day 7 DRVr/ISN/3TC  MAI seems to be the leading cause of his fevers at this point. He certainly could have disseminated CMV although a CMV DNA+ on blood is relatively non-specific, especially without doing a viral copy #. CT abdomen did not reveal findings that would suggest lymphoma.   Johny Sax Infectious Diseases [phone] 05/18/2012, 12:49 PM   LOS: 7 days

## 2012-05-19 LAB — CBC
Hemoglobin: 6.7 g/dL — CL (ref 13.0–17.0)
MCV: 76.8 fL — ABNORMAL LOW (ref 78.0–100.0)
Platelets: 203 10*3/uL (ref 150–400)
RBC: 2.46 MIL/uL — ABNORMAL LOW (ref 4.22–5.81)
WBC: 11.7 10*3/uL — ABNORMAL HIGH (ref 4.0–10.5)

## 2012-05-19 LAB — RENAL FUNCTION PANEL
Albumin: 2.1 g/dL — ABNORMAL LOW (ref 3.5–5.2)
BUN: 58 mg/dL — ABNORMAL HIGH (ref 6–23)
CO2: 26 mEq/L (ref 19–32)
Chloride: 100 mEq/L (ref 96–112)
Potassium: 3 mEq/L — ABNORMAL LOW (ref 3.5–5.1)

## 2012-05-19 LAB — MAGNESIUM: Magnesium: 2.1 mg/dL (ref 1.5–2.5)

## 2012-05-19 LAB — CBC WITH DIFFERENTIAL/PLATELET
Eosinophils Absolute: 0.8 10*3/uL — ABNORMAL HIGH (ref 0.0–0.7)
Lymphs Abs: 0.6 10*3/uL — ABNORMAL LOW (ref 0.7–4.0)
MCH: 27.3 pg (ref 26.0–34.0)
MCHC: 35 g/dL (ref 30.0–36.0)
MCV: 77.8 fL — ABNORMAL LOW (ref 78.0–100.0)
Monocytes Absolute: 0.5 10*3/uL (ref 0.1–1.0)
Neutrophils Relative %: 88 % — ABNORMAL HIGH (ref 43–77)
Platelets: 215 10*3/uL (ref 150–400)
WBC Morphology: INCREASED

## 2012-05-19 MED ORDER — FLUCONAZOLE 100 MG PO TABS
100.0000 mg | ORAL_TABLET | ORAL | Status: DC
  Administered 2012-05-20: 100 mg via ORAL
  Filled 2012-05-19 (×2): qty 1

## 2012-05-19 MED ORDER — FUROSEMIDE 10 MG/ML IJ SOLN
20.0000 mg | Freq: Once | INTRAMUSCULAR | Status: AC
  Administered 2012-05-19: 20 mg via INTRAVENOUS

## 2012-05-19 MED ORDER — IPRATROPIUM BROMIDE 0.02 % IN SOLN
0.5000 mg | RESPIRATORY_TRACT | Status: DC | PRN
  Administered 2012-05-19 – 2012-05-20 (×3): 0.5 mg via RESPIRATORY_TRACT
  Filled 2012-05-19 (×3): qty 2.5

## 2012-05-19 MED ORDER — FUROSEMIDE 10 MG/ML IJ SOLN
INTRAMUSCULAR | Status: AC
  Filled 2012-05-19: qty 4

## 2012-05-19 MED ORDER — POTASSIUM CHLORIDE CRYS ER 20 MEQ PO TBCR
40.0000 meq | EXTENDED_RELEASE_TABLET | ORAL | Status: AC
  Administered 2012-05-19 (×3): 40 meq via ORAL
  Filled 2012-05-19 (×3): qty 2

## 2012-05-19 MED ORDER — ALBUTEROL SULFATE (5 MG/ML) 0.5% IN NEBU
2.5000 mg | INHALATION_SOLUTION | RESPIRATORY_TRACT | Status: DC | PRN
  Administered 2012-05-19 – 2012-05-20 (×3): 2.5 mg via RESPIRATORY_TRACT
  Filled 2012-05-19 (×3): qty 0.5

## 2012-05-19 MED ORDER — WHITE PETROLATUM GEL
Status: AC
  Administered 2012-05-19: 21:00:00
  Filled 2012-05-19: qty 5

## 2012-05-19 MED ORDER — LIDOCAINE-PRILOCAINE 2.5-2.5 % EX CREA
TOPICAL_CREAM | CUTANEOUS | Status: DC | PRN
  Filled 2012-05-19: qty 5

## 2012-05-19 NOTE — Progress Notes (Signed)
 [facility]  Subjective:  Awake, alert, says he ate [place] toast earlier today.  Still with diarrhea 4-6/day.  Girlfriend at bedside   Objective: Vital signs in last 24 hours: Blood pressure 131/76, pulse 125, temperature 102.5 F (39.2 C), temperature source Oral, resp. rate 14, height 5\' 6"  (1.676 m), weight 104.1 kg (229 lb 8 oz), SpO2 95.00%.    PHYSICAL EXAM General--as above Chest--crackles bases Heart--no rub Abd--protuberant, nontender, BS + Extr--tr edema  Date Weight  10 Aug   87 kg  17 Aug  104.1 kg (?) 18 Aug  104.1 kg  I/O yesterday 1520/2000   Lab Results:   Lab 05/19/12 0505 05/18/12 1823 05/18/12 0630 05/17/12 2134  NA 140 137 136 --  K 3.0* 3.3* 3.3* --  CL 100 96 97 --  CO2 26 26 26  --  BUN 58* 56* 57* --  CREATININE 6.21* 5.94* 5.84* --  ALB -- -- -- --  GLUCOSE 95 -- -- --  CALCIUM 7.9* 7.8* 7.9* --  PHOS 3.8 -- 1.6* 2.0*     Basename 05/19/12 0505 05/18/12 1030  WBC 11.7* 12.4*  HGB 6.7* 7.3*  HCT 18.9* 20.5*  PLT 203 151    Assessment/Plan: Mr. Garrette is a 31 yo B man, history of HIV (CD4 = 10, VL = 475K, 03/21/12), prior HBV (sAg neg, sAb pos, cAb pos, 03/21/12), presenting 8/10 with nausea, vomiting,diarrhea, SOB, and fevers, consulted for acute renal failure.  1. Acute Renal Failure - Cr a bit higher today. Proteinuria seen on UA.  24 hr urine pro/Cr--860 mg pro/1592 mg Cr/24 hr. Bicarb better. Cr starting to fall (contrast plus toxic vanco levels)  - IVF have been d/c'd, replace K. Check Mg in AM (2.1 today).   I'd suggest more furosemide if urine output doesn't pick up 2. Anemia - Patient has continued anemia, despite blood transfusion. No obvious source of bleeding on colonoscopy. Concern for hemolytic anemia (perhaps warm autoantibody HA? This can be seen in HIV). Ferritin = 1334 on 05/12/12. Saturation ratio 19, 05/15/12.  -managed per primary team, consider LDH, haptoglobin, coombs test  PRBC's today 3.  Hypokalemia -PO KCl today.. Check Mg again tomorrow.  4. Hypophosphatemia--phos 3.8 today. 5. Thrombocytopenia - plts were 34K--now 203 K --resolved  6. HIV - CD4 = 10 --per primary team; atovaquone, fluconazole, azithromycin, ethambutol   plus prezista, epivir,norvir, isentress.  Still with fever.    LOS: 8 days   Daily Doe F 05/19/2012,10:34 AM   .labalb

## 2012-05-19 NOTE — Research (Signed)
Spoke with Dr. Garald Braver regarding Dr. Vickey Huger recommendation for pt to receive dose of Lasix post transfusion. Dr. Garald Braver to place order for Lasix. Will continue to monitor.

## 2012-05-19 NOTE — Progress Notes (Signed)
INFECTIOUS DISEASE PROGRESS NOTE  ID: Zachary Hill is a 31 y.o. male with   Principal Problem:  *Fever Active Problems:  Human immunodeficiency virus (HIV) disease  Nausea & vomiting  Odynophagia  Diarrhea  Pharyngitis  Acute kidney injury  Thrombocytopenia  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  Subjective: Cont to c/o dry mouth  Abtx:  Anti-infectives     Start     Dose/Rate Route Frequency Ordered Stop   05/20/12 0800   fluconazole (DIFLUCAN) tablet 100 mg        100 mg Oral Every 24 hours 05/19/12 0846     05/18/12 1900   lamiVUDine (EPIVIR) 10 MG/ML solution 50 mg        50 mg Oral Daily 05/17/12 1736     05/18/12 0700   atovaquone (MEPRON) 750 MG/5ML suspension 1,500 mg        1,500 mg Oral Daily with breakfast 05/17/12 1013     05/17/12 1900   Darunavir Ethanolate (PREZISTA) tablet 800 mg        800 mg Oral Daily 05/17/12 1736     05/17/12 1900   ritonavir (NORVIR) tablet 100 mg        100 mg Oral Daily 05/17/12 1736     05/17/12 1900   raltegravir (ISENTRESS) tablet 400 mg        400 mg Oral 2 times daily 05/17/12 1736     05/17/12 1900   lamiVUDine (EPIVIR) tablet 150 mg        150 mg Oral  Once 05/17/12 1736 05/17/12 2115   05/17/12 1000   azithromycin (ZITHROMAX) tablet 1,200 mg  Status:  Discontinued        1,200 mg Oral Every 7 days 05/11/12 2059 05/12/12 2247   05/17/12 0700   Darunavir Ethanolate (PREZISTA) tablet 800 mg  Status:  Discontinued        800 mg Oral Daily with breakfast 05/16/12 1054 05/16/12 1056   05/17/12 0700   ritonavir (NORVIR) tablet 100 mg  Status:  Discontinued        100 mg Oral Daily with breakfast 05/16/12 1054 05/16/12 1056   05/16/12 1100   lamiVUDine (EPIVIR) tablet 150 mg  Status:  Discontinued     Comments: PLEASE dose renally!!!!!! No option for consult pharmacy to dose.      150 mg Oral Daily 05/16/12 1054 05/16/12 1056   05/16/12 1100   raltegravir (ISENTRESS) tablet 400 mg  Status:  Discontinued        400 mg  Oral 2 times daily 05/16/12 1054 05/16/12 1056   05/14/12 0800   fluconazole (DIFLUCAN) IVPB 100 mg  Status:  Discontinued        100 mg 50 mL/hr over 60 Minutes Intravenous Every 24 hours 05/14/12 0733 05/19/12 0845   05/13/12 1000   azithromycin (ZITHROMAX) tablet 600 mg        600 mg Oral Daily 05/13/12 0857     05/13/12 0800   fluconazole (DIFLUCAN) IVPB 200 mg  Status:  Discontinued        200 mg 100 mL/hr over 60 Minutes Intravenous Every 24 hours 05/12/12 1758 05/14/12 0733   05/12/12 2359   ethambutol (MYAMBUTOL) tablet 15 mg/kg        15 mg/kg  89.9 kg Oral Daily 05/12/12 2247     05/12/12 2359   azithromycin (ZITHROMAX) tablet 500 mg  Status:  Discontinued        500 mg Oral Daily 05/12/12 2325  05/13/12 0857   05/12/12 2300   azithromycin (ZITHROMAX) tablet 250 mg  Status:  Discontinued        250 mg Oral Daily 05/12/12 2247 05/12/12 2324   05/12/12 2000   metroNIDAZOLE (FLAGYL) IVPB 500 mg  Status:  Discontinued        500 mg 100 mL/hr over 60 Minutes Intravenous Every 8 hours 05/12/12 1821 05/14/12 1040   05/12/12 1830   fluconazole (DIFLUCAN) IVPB 400 mg  Status:  Discontinued        400 mg 200 mL/hr over 60 Minutes Intravenous Every 24 hours 05/12/12 1744 05/12/12 1758   05/12/12 1830   cefTRIAXone (ROCEPHIN) 2 g in dextrose 5 % 50 mL IVPB  Status:  Discontinued        2 g 100 mL/hr over 30 Minutes Intravenous Every 24 hours 05/12/12 1821 05/14/12 0807   05/11/12 2200   fluconazole (DIFLUCAN) tablet 200 mg  Status:  Discontinued        200 mg Oral Daily 05/11/12 2059 05/12/12 1743   05/11/12 2200   dapsone tablet 100 mg  Status:  Discontinued        100 mg Oral Daily 05/11/12 2059 05/17/12 1013   05/11/12 2200   vancomycin (VANCOCIN) IVPB 1000 mg/200 mL premix  Status:  Discontinued        1,000 mg 200 mL/hr over 60 Minutes Intravenous Every 8 hours 05/11/12 2134 05/13/12 2058   05/11/12 2200   piperacillin-tazobactam (ZOSYN) IVPB 3.375 g  Status:   Discontinued        3.375 g 12.5 mL/hr over 240 Minutes Intravenous 3 times per day 05/11/12 2134 05/12/12 1821          Medications:  Scheduled:   . atovaquone  1,500 mg Oral Q breakfast  . azithromycin  600 mg Oral Daily  . darunavir  800 mg Oral Daily  . ethambutol  15 mg/kg Oral Daily  . fluconazole  100 mg Oral Q24H  . lamiVUDine  50 mg Oral Daily  . pantoprazole  40 mg Oral Q1200  . potassium chloride  40 mEq Oral Q4H  . potassium phosphate IVPB (mEq)  60 mEq Intravenous Once  . raltegravir  400 mg Oral BID  . ritonavir  100 mg Oral Daily  . senna  1 tablet Oral BID  . DISCONTD: fluconazole (DIFLUCAN) IV  100 mg Intravenous Q24H  . DISCONTD: potassium chloride  20 mEq Oral BID    Objective: Vital signs in last 24 hours: Temp:  [100.1 F (37.8 C)-102.5 F (39.2 C)] 102 F (38.9 C) (08/18 1210) Pulse Rate:  [118-130] 118  (08/18 1210) Resp:  [14-32] 25  (08/18 1210) BP: (128-156)/(64-92) 134/92 mmHg (08/18 1210) SpO2:  [91 %-95 %] 95 % (08/18 0300) Weight:  [104.1 kg (229 lb 8 oz)] 104.1 kg (229 lb 8 oz) (08/18 0439)   General appearance: alert, cooperative, fatigued and mild distress Eyes: injected Resp: rhonchi bilaterally Cardio: tachycardic GI: normal findings: bowel sounds normal and soft, non-tender  Lab Results  Basename 05/19/12 0505 05/18/12 1823 05/18/12 1030  WBC 11.7* -- 12.4*  HGB 6.7* -- 7.3*  HCT 18.9* -- 20.5*  NA 140 137 --  K 3.0* 3.3* --  CL 100 96 --  CO2 26 26 --  BUN 58* 56* --  CREATININE 6.21* 5.94* --  GLU -- -- --   Liver Panel  Basename 05/19/12 0505 05/18/12 0630  PROT -- --  ALBUMIN 2.1* 2.0*  AST -- --  ALT -- --  ALKPHOS -- --  BILITOT -- --  BILIDIR -- --  IBILI -- --   Sedimentation Rate No results found for this basename: ESRSEDRATE in the last 72 hours C-Reactive Protein No results found for this basename: CRP:2 in the last 72 hours  Microbiology: Recent Results (from the past 240 hour(s))  URINE  CULTURE     Status: Normal   Collection Time   05/11/12 12:18 PM      Component Value Range Status Comment   Specimen Description URINE, CATHETERIZED   Final    Special Requests NONE   Final    Culture  Setup Time 05/11/2012 22:56   Final    Colony Count NO GROWTH   Final    Culture NO GROWTH   Final    Report Status 05/12/2012 FINAL   Final   CULTURE, BLOOD (ROUTINE X 2)     Status: Normal   Collection Time   05/11/12  4:10 PM      Component Value Range Status Comment   Specimen Description BLOOD LEFT ARM   Final    Special Requests BOTTLES DRAWN AEROBIC AND ANAEROBIC 10CC   Final    Culture  Setup Time 05/11/2012 22:55   Final    Culture NO GROWTH 5 DAYS   Final    Report Status 05/17/2012 FINAL   Final   CULTURE, BLOOD (ROUTINE X 2)     Status: Normal   Collection Time   05/11/12  4:25 PM      Component Value Range Status Comment   Specimen Description BLOOD LEFT HAND   Final    Special Requests BOTTLES DRAWN AEROBIC AND ANAEROBIC 10CC   Final    Culture  Setup Time 05/11/2012 22:55   Final    Culture NO GROWTH 5 DAYS   Final    Report Status 05/17/2012 FINAL   Final   AFB CULTURE, BLOOD     Status: Normal (Preliminary result)   Collection Time   05/12/12 12:33 PM      Component Value Range Status Comment   Specimen Description BLOOD LEFT ARM   Final    Special Requests BOTTLES DRAWN AEROBIC ONLY 3CC   Final    Culture     Final    Value: CULTURE WILL BE EXAMINED FOR 6 WEEKS BEFORE ISSUING A FINAL REPORT   Report Status PENDING   Incomplete   MRSA PCR SCREENING     Status: Normal   Collection Time   05/12/12  3:35 PM      Component Value Range Status Comment   MRSA by PCR NEGATIVE  NEGATIVE Final   CULTURE, EXPECTORATED SPUTUM-ASSESSMENT     Status: Normal   Collection Time   05/12/12  5:04 PM      Component Value Range Status Comment   Specimen Description SPUTUM   Final    Special Requests NONE   Final    Sputum evaluation     Final    Value: MICROSCOPIC FINDINGS  SUGGEST THAT THIS SPECIMEN IS NOT REPRESENTATIVE OF LOWER RESPIRATORY SECRETIONS. PLEASE RECOLLECT.     CALLED TO MATTHEWS,M RN 05/12/12 1745 WOOTEN,K   Report Status 05/12/2012 FINAL   Final   STOOL CULTURE     Status: Normal   Collection Time   05/12/12  7:01 PM      Component Value Range Status Comment   Specimen Description STOOL   Final    Special Requests Immunocompromised   Final  Culture     Final    Value: NO SALMONELLA, SHIGELLA, CAMPYLOBACTER, YERSINIA, OR E.COLI [id number]:[id number] ISOLATED     Note: REDUCED NORMAL FLORA PRESENT   Report Status 05/16/2012 FINAL   Final   OVA AND PARASITE EXAMINATION     Status: Normal   Collection Time   05/12/12  7:01 PM      Component Value Range Status Comment   Specimen Description STOOL   Final    Special Requests NONE   Final    Ova and parasites NO OVA OR PARASITES SEEN   Final    Report Status 05/15/2012 FINAL   Final   CRYPTOSPORIDIUM SMEAR, FECAL     Status: Normal   Collection Time   05/12/12  7:01 PM      Component Value Range Status Comment   Specimen Description STOOL   Final    Special Requests NONE   Final    Cryptosporidium Smear. NO Cryptosporidium Cyclospora or Isospora seen.   Final    Report Status 05/14/2012 FINAL   Final   AFB CULTURE, BLOOD     Status: Normal (Preliminary result)   Collection Time   05/13/12  9:35 AM      Component Value Range Status Comment   Specimen Description BLOOD LEFT ARM   Final    Special Requests 5CC   Final    Culture     Final    Value: CULTURE WILL BE EXAMINED FOR 6 WEEKS BEFORE ISSUING A FINAL REPORT   Report Status PENDING   Incomplete   CULTURE, EXPECTORATED SPUTUM-ASSESSMENT     Status: Normal   Collection Time   05/13/12 11:03 AM      Component Value Range Status Comment   Specimen Description SPUTUM   Final    Special Requests Immunocompromised   Final    Sputum evaluation     Final    Value: MICROSCOPIC FINDINGS SUGGEST THAT THIS SPECIMEN IS NOT REPRESENTATIVE OF LOWER RESPIRATORY  SECRETIONS. PLEASE RECOLLECT.     Gram Stain Report Called to,Read Back By and Verified With: Ernestina Penna RN 12:05 05/13/12 ([email or web address])   Report Status 05/13/2012 FINAL   Final   FUNGUS CULTURE W SMEAR     Status: Normal (Preliminary result)   Collection Time   05/13/12  4:41 PM      Component Value Range Status Comment   Specimen Description TISSUE ESOPHAGUS   Final    Special Requests NONE   Final    Fungal Smear NO YEAST OR FUNGAL ELEMENTS SEEN   Final    Culture CULTURE IN PROGRESS FOR FOUR WEEKS   Final    Report Status PENDING   Incomplete   VIRAL CULTURE VIRC     Status: Normal   Collection Time   05/13/12  4:41 PM      Component Value Range Status Comment   Specimen Description TISSUE ESOPHAGUS LOOK FOR CMV AND HERPES   Final    Special Requests NONE   Final    Culture     Final    Value: No Herpes Simplex Detected in Cell Culture No Cytomegalovirus identified in cell culture  A negative result does not exclude the possibility of CMV infection;inappropriate specimen      collection,storage and transport may lead to false negative results.   Report Status 05/17/2012 FINAL   Final   CLOSTRIDIUM DIFFICILE BY PCR     Status: Normal   Collection Time   05/14/12  3:53 PM      Component Value Range Status Comment   C difficile by pcr NEGATIVE  NEGATIVE Final   FUNGUS CULTURE W SMEAR     Status: Normal (Preliminary result)   Collection Time   05/15/12  3:54 PM      Component Value Range Status Comment   Specimen Description TISSUE   Final    Special Requests     Final    Value: SAMPLE NO 1 SPECIMEN IN CUP TRANSVERSE AND DESC COLON   Fungal Smear NO YEAST OR FUNGAL ELEMENTS SEEN   Final    Culture CULTURE IN PROGRESS FOR FOUR WEEKS   Final    Report Status PENDING   Incomplete   VIRAL CULTURE VIRC     Status: Normal (Preliminary result)   Collection Time   05/15/12  3:54 PM      Component Value Range Status Comment    Specimen Description BIOPSY SIGMOID AND RECTAL LOOK FOR HSV AND CMV   Final    Special Requests NONE   Final    Culture Culture has been initiated.   Final    Report Status PENDING   Incomplete   CSF CULTURE     Status: Normal (Preliminary result)   Collection Time   05/16/12  5:20 PM      Component Value Range Status Comment   Specimen Description CSF   Final    Special Requests TUBE 2@6 .5CC   Final    Gram Stain     Final    Value: CYTOSPIN WBC PRESENT,BOTH PMN AND MONONUCLEAR     NO ORGANISMS SEEN     Performed at [facility]   Culture NO GROWTH 2 DAYS   Final    Report Status PENDING   Incomplete   GRAM STAIN     Status: Normal   Collection Time   05/16/12  5:20 PM      Component Value Range Status Comment   Specimen Description CSF   Final    Special Requests TUBE 2@6 .5CC   Final    Gram Stain     Final    Value: CYTOSPIN SLIDE     NO ORGANISMS SEEN     WBC PRESENT,BOTH PMN AND MONONUCLEAR   Report Status 05/16/2012 FINAL   Final   TOXOPLASMA GONDII, PCR     Status: Normal   Collection Time   05/16/12  5:20 PM      Component Value Range Status Comment   Source - TGPCR CSF   Final    Toxoplasma gondii, DNA ql Not Detected  Not Detected Final   CLOSTRIDIUM DIFFICILE BY PCR     Status: Normal   Collection Time   05/17/12  7:09 PM      Component Value Range Status Comment   C difficile by pcr NEGATIVE  NEGATIVE Final     Studies/Results: Dg Chest 2 View  05/17/2012  *RADIOLOGY REPORT*  Clinical Data: Fever, cough.  CHEST - 2 VIEW  Comparison: May 13, 2012.  Findings: Minimal bilateral pleural effusions are noted. Cardiomediastinal silhouette appears normal.  No pneumonia or atelectasis is noted.  IMPRESSION: Minimal bilateral pleural effusions.  Original Report Authenticated By: Venita Sheffield., M.D.   Mr Brain Wo Contrast  05/17/2012  *RADIOLOGY REPORT*  Clinical Data: 31 year old male with headache, fever, HIV. Renal insufficiency precludes contrast.  MRI  HEAD WITHOUT CONTRAST  Technique:  Multiplanar, multiecho pulse sequences of the brain and surrounding structures were obtained according to standard protocol without intravenous contrast.  Comparison: Head CT without contrast 05/12/2012.  Findings: Susceptibility artifact on diffusion, T1, and T2* imaging suggesting parenteral iron. Subsequently, the T1 images on this exam a.  No contrast was administered (but no contrast was given).  The appearance is also felt to explain occasional areas of heterogeneous increased diffusion signal.  Normal cerebral volume.  No ventriculomegaly. No midline shift, mass effect, or evidence of mass lesion.  Wallace Cullens and white matter signal is within normal limits throughout the brain.  No areas suspicious for infarct. Major intracranial vascular flow voids are preserved.  Grossly negative visualized cervical spine. No acute intracranial hemorrhage identified.  Scattered paranasal sinus mucosal thickening.  Mild to moderate fluid signal in the mastoid air cells.  A small volume of retained secretions in the nasopharynx.  Visualized orbit soft tissues are within normal limits.  Visualized bone marrow signal is within normal limits. Negative scalp soft tissues.  IMPRESSION: 1.  Artifact on multiple sequences suggesting recent parenteral iron or iron overload (reportedly this patient may have recently had multiple transfusions). 2.  Otherwise negative noncontrast MRI appearance of the brain. Renal insufficiency precludes contrast. 3.  Mild to moderate paranasal sinus and mastoid inflammatory changes, clinical significance is unclear.  The mastoid findings likely are sterile effusions.  Original Report Authenticated By: Harley Hallmark, M.D.     Assessment/Plan: AIDS  SIRS/Fever  ARF  Thrombocytopenia, Anemia  Suspected MAI (BCx 8-11/12)  CMV DNA + (blood)  Colonoscopy- minimal colitis (path - for organisms)  ? Candidal esophagitis   Total days of antibiotics 8    Azithro/Ethambutol Day 7  Mepron Day 6 Fluconazole Day 8  DRVr/ISN/3TC  Discussed with Dr Tonny Branch- suspect MAI or possibly disseminated CMV Sent for CMV quant on blood, could consider Valcyte if positive. Will send stool for MAI/AFB (this is not as sensitive as BCx for AFB).  Alternatively, could have primary heme problem (or due to drug) that is driving his fever. Could consider heme eval.....  Johny Sax Infectious Diseases [phone] 05/19/2012, 12:30 PM   LOS: 8 days

## 2012-05-19 NOTE — Progress Notes (Signed)
Subjective: He continues to have watery diarrhea but no N/V. His Hb has dropped to 6.7 this morning, he was feeling a little "quizzy" but no dizziness, no increased shortness of breath, or heart palpitations. His IVF were discontinued yesterday, he is eating well and is net negative today at ~1,412ml.   He denies hematochezia or melena.   Objective: Vital signs in last 24 hours: Filed Vitals:   05/18/12 2300 05/19/12 0300 05/19/12 0439 05/19/12 0700  BP: 129/64 131/76    Pulse: 119 125    Temp: 100.2 F (37.9 C) 100.1 F (37.8 C)  102.5 F (39.2 C)  TempSrc: Oral Axillary  Oral  Resp: 27 14    Height:      Weight:   229 lb 8 oz (104.1 kg)   SpO2: 91% 95%     Weight change: 0 lb (0 kg)  Intake/Output Summary (Last 24 hours) at 05/19/12 0859 Last data filed at 05/19/12 0400  Gross per 24 hour  Intake   1200 ml  Output   2600 ml  Net  -1400 ml   Vitals reviewed.  General: Sitting up in bed, eating his breakfast, in no respiratory distress  HEENT: PERRL, EOMI, no scleral icterus  Cardiac: tachycardic but regular, no rubs, murmurs or gallops  Pulm: Crackles up to his mid lung fields. Tachypnea, no wheezes, or rhonchi  Abd: soft, mild diffuse tenderness, nondistended, BS present  Ext: warm and well perfused, 1+ pitting edema to the knees.  Neuro: alert and oriented X3, cranial nerves II-XII grossly intact, strength and sensation to light touch equal in bilateral upper and lower extremities  Lab Results: Basic Metabolic Panel:  Lab 05/19/12 2956 05/18/12 1823 05/18/12 0630  NA 140 137 --  K 3.0* 3.3* --  CL 100 96 --  CO2 26 26 --  GLUCOSE 95 101* --  BUN 58* 56* --  CREATININE 6.21* 5.94* --  CALCIUM 7.9* 7.8* --  MG 2.1 -- 2.3  PHOS 3.8 -- 1.6*   Liver Function Tests:  Lab 05/19/12 0505 05/18/12 0630 05/14/12 0530  AST -- -- 77*  ALT -- -- 67*  ALKPHOS -- -- 133*  BILITOT -- -- 0.9  PROT -- -- 6.5  ALBUMIN 2.1* 2.0* --   CBC:  Lab 05/19/12 0505 05/18/12  1030 05/17/12 0506 05/16/12 0500  WBC 11.7* 12.4* -- --  NEUTROABS -- -- 9.7* 5.9  HGB 6.7* 7.3* -- --  HCT 18.9* 20.5* -- --  MCV 76.8* 78.2 -- --  PLT 203 151 -- --   CD-Dimer:  Lab 05/14/12 0954  DDIMER 7.59*   Fasting Lipid Panel:  Lab 05/12/12 1932  CHOL 141  HDL 13*  LDLCALC 85  TRIG 213*  CHOLHDL 10.8  LDLDIRECT --   Coagulation:  Lab 05/14/12 0954  LABPROT 17.2*  INR 1.38   Anemia Panel:  Lab 05/17/12 0910 05/15/12 0425 05/12/12 1234  VITAMINB12 -- -- --  FOLATE -- -- --  FERRITIN -- -- 1334*  TIBC -- 149* --  IRON -- 28* --  RETICCTPCT 0.4 -- --   Urinalysis:  Lab 05/13/12 2249  COLORURINE ORANGE*  LABSPEC 1.027  PHURINE 5.0  GLUCOSEU NEGATIVE  HGBUR LARGE*  BILIRUBINUR SMALL*  KETONESUR 15*  PROTEINUR 100*  UROBILINOGEN 0.2  NITRITE POSITIVE*  LEUKOCYTESUR MODERATE*  Misc. Labs: C. Diff: Neg  DAT: Positive for complement  LDH: 546  Haptoglobin: 346  Crypto CSF: Neg  Crypto Blood: Neg  CMV CSF: Pending  Giardia/Cryptosporidium: Neg  Colonoscopy HSV/CMV: Pending  AM Cortisol 26.5  HLA B*5701: Pending  Fibrinogen: 705  EGD HSV and CMV: Neg  Microsporidia: Neg  O&P: Neg  CMV IgG: Positive  CMV DNA: Detected     Micro Results: Recent Results (from the past 240 hour(s))  URINE CULTURE     Status: Normal   Collection Time   05/11/12 12:18 PM      Component Value Range Status Comment   Specimen Description URINE, CATHETERIZED   Final    Special Requests NONE   Final    Culture  Setup Time 05/11/2012 22:56   Final    Colony Count NO GROWTH   Final    Culture NO GROWTH   Final    Report Status 05/12/2012 FINAL   Final   CULTURE, BLOOD (ROUTINE X 2)     Status: Normal   Collection Time   05/11/12  4:10 PM      Component Value Range Status Comment   Specimen Description BLOOD LEFT ARM   Final    Special Requests BOTTLES DRAWN AEROBIC AND ANAEROBIC 10CC   Final    Culture  Setup Time 05/11/2012 22:55   Final    Culture NO GROWTH  5 DAYS   Final    Report Status 05/17/2012 FINAL   Final   CULTURE, BLOOD (ROUTINE X 2)     Status: Normal   Collection Time   05/11/12  4:25 PM      Component Value Range Status Comment   Specimen Description BLOOD LEFT HAND   Final    Special Requests BOTTLES DRAWN AEROBIC AND ANAEROBIC 10CC   Final    Culture  Setup Time 05/11/2012 22:55   Final    Culture NO GROWTH 5 DAYS   Final    Report Status 05/17/2012 FINAL   Final   AFB CULTURE, BLOOD     Status: Normal (Preliminary result)   Collection Time   05/12/12 12:33 PM      Component Value Range Status Comment   Specimen Description BLOOD LEFT ARM   Final    Special Requests BOTTLES DRAWN AEROBIC ONLY 3CC   Final    Culture     Final    Value: CULTURE WILL BE EXAMINED FOR 6 WEEKS BEFORE ISSUING A FINAL REPORT   Report Status PENDING   Incomplete   MRSA PCR SCREENING     Status: Normal   Collection Time   05/12/12  3:35 PM      Component Value Range Status Comment   MRSA by PCR NEGATIVE  NEGATIVE Final   CULTURE, EXPECTORATED SPUTUM-ASSESSMENT     Status: Normal   Collection Time   05/12/12  5:04 PM      Component Value Range Status Comment   Specimen Description SPUTUM   Final    Special Requests NONE   Final    Sputum evaluation     Final    Value: MICROSCOPIC FINDINGS SUGGEST THAT THIS SPECIMEN IS NOT REPRESENTATIVE OF LOWER RESPIRATORY SECRETIONS. PLEASE RECOLLECT.     CALLED TO MATTHEWS,M RN 05/12/12 1745 WOOTEN,K   Report Status 05/12/2012 FINAL   Final   STOOL CULTURE     Status: Normal   Collection Time   05/12/12  7:01 PM      Component Value Range Status Comment   Specimen Description STOOL   Final    Special Requests Immunocompromised   Final    Culture     Final    Value:  NO SALMONELLA, SHIGELLA, CAMPYLOBACTER, YERSINIA, OR E.COLI 0157:H7 ISOLATED     Note: REDUCED NORMAL FLORA PRESENT   Report Status 05/16/2012 FINAL   Final   OVA AND PARASITE EXAMINATION     Status: Normal   Collection Time   05/12/12  7:01 PM        Component Value Range Status Comment   Specimen Description STOOL   Final    Special Requests NONE   Final    Ova and parasites NO OVA OR PARASITES SEEN   Final    Report Status 05/15/2012 FINAL   Final   CRYPTOSPORIDIUM SMEAR, FECAL     Status: Normal   Collection Time   05/12/12  7:01 PM      Component Value Range Status Comment   Specimen Description STOOL   Final    Special Requests NONE   Final    Cryptosporidium Smear. NO Cryptosporidium Cyclospora or Isospora seen.   Final    Report Status 05/14/2012 FINAL   Final   AFB CULTURE, BLOOD     Status: Normal (Preliminary result)   Collection Time   05/13/12  9:35 AM      Component Value Range Status Comment   Specimen Description BLOOD LEFT ARM   Final    Special Requests 5CC   Final    Culture     Final    Value: CULTURE WILL BE EXAMINED FOR 6 WEEKS BEFORE ISSUING A FINAL REPORT   Report Status PENDING   Incomplete   CULTURE, EXPECTORATED SPUTUM-ASSESSMENT     Status: Normal   Collection Time   05/13/12 11:03 AM      Component Value Range Status Comment   Specimen Description SPUTUM   Final    Special Requests Immunocompromised   Final    Sputum evaluation     Final    Value: MICROSCOPIC FINDINGS SUGGEST THAT THIS SPECIMEN IS NOT REPRESENTATIVE OF LOWER RESPIRATORY SECRETIONS. PLEASE RECOLLECT.     Gram Stain Report Called to,Read Back By and Verified With: Ernestina Penna RN 12:05 05/13/12 ([email or web address])   Report Status 05/13/2012 FINAL   Final   FUNGUS CULTURE W SMEAR     Status: Normal (Preliminary result)   Collection Time   05/13/12  4:41 PM      Component Value Range Status Comment   Specimen Description TISSUE ESOPHAGUS   Final    Special Requests NONE   Final    Fungal Smear NO YEAST OR FUNGAL ELEMENTS SEEN   Final    Culture CULTURE IN PROGRESS FOR FOUR WEEKS   Final    Report Status PENDING   Incomplete   VIRAL CULTURE VIRC     Status: Normal   Collection Time   05/13/12  4:41 PM      Component Value Range Status  Comment   Specimen Description TISSUE ESOPHAGUS LOOK FOR CMV AND HERPES   Final    Special Requests NONE   Final    Culture     Final    Value: No Herpes Simplex Detected in Cell Culture No Cytomegalovirus identified in cell culture                                                                A negative result does  not exclude the possibility of CMV infection;inappropriate specimen      collection,storage and transport may lead to false negative results.   Report Status 05/17/2012 FINAL   Final   CLOSTRIDIUM DIFFICILE BY PCR     Status: Normal   Collection Time   05/14/12  3:53 PM      Component Value Range Status Comment   C difficile by pcr NEGATIVE  NEGATIVE Final   FUNGUS CULTURE W SMEAR     Status: Normal (Preliminary result)   Collection Time   05/15/12  3:54 PM      Component Value Range Status Comment   Specimen Description TISSUE   Final    Special Requests     Final    Value: SAMPLE NO 1 SPECIMEN IN CUP TRANSVERSE AND DESC COLON   Fungal Smear NO YEAST OR FUNGAL ELEMENTS SEEN   Final    Culture CULTURE IN PROGRESS FOR FOUR WEEKS   Final    Report Status PENDING   Incomplete   VIRAL CULTURE VIRC     Status: Normal (Preliminary result)   Collection Time   05/15/12  3:54 PM      Component Value Range Status Comment   Specimen Description BIOPSY SIGMOID AND RECTAL LOOK FOR HSV AND CMV   Final    Special Requests NONE   Final    Culture Culture has been initiated.   Final    Report Status PENDING   Incomplete   CSF CULTURE     Status: Normal (Preliminary result)   Collection Time   05/16/12  5:20 PM      Component Value Range Status Comment   Specimen Description CSF   Final    Special Requests TUBE 2@6 .5CC   Final    Gram Stain     Final    Value: CYTOSPIN WBC PRESENT,BOTH PMN AND MONONUCLEAR     NO ORGANISMS SEEN     Performed at [facility]   Culture NO GROWTH 2 DAYS   Final    Report Status PENDING   Incomplete   GRAM STAIN     Status: Normal   Collection  Time   05/16/12  5:20 PM      Component Value Range Status Comment   Specimen Description CSF   Final    Special Requests TUBE 2@6 .5CC   Final    Gram Stain     Final    Value: CYTOSPIN SLIDE     NO ORGANISMS SEEN     WBC PRESENT,BOTH PMN AND MONONUCLEAR   Report Status 05/16/2012 FINAL   Final   TOXOPLASMA GONDII, PCR     Status: Normal   Collection Time   05/16/12  5:20 PM      Component Value Range Status Comment   Source - TGPCR CSF   Final    Toxoplasma gondii, DNA ql Not Detected  Not Detected Final   CLOSTRIDIUM DIFFICILE BY PCR     Status: Normal   Collection Time   05/17/12  7:09 PM      Component Value Range Status Comment   C difficile by pcr NEGATIVE  NEGATIVE Final    Studies/Results: Dg Chest 2 View  05/17/2012  *RADIOLOGY REPORT*  Clinical Data: Fever, cough.  CHEST - 2 VIEW  Comparison: May 13, 2012.  Findings: Minimal bilateral pleural effusions are noted. Cardiomediastinal silhouette appears normal.  No pneumonia or atelectasis is noted.  IMPRESSION: Minimal bilateral pleural effusions.  Original Report Authenticated By:  Venita Sheffield., M.D.   Mr Brain Wo Contrast  05/17/2012  *RADIOLOGY REPORT*  Clinical Data: 31 year old male with headache, fever, HIV. Renal insufficiency precludes contrast.  MRI HEAD WITHOUT CONTRAST  Technique:  Multiplanar, multiecho pulse sequences of the brain and surrounding structures were obtained according to standard protocol without intravenous contrast.  Comparison: Head CT without contrast 05/12/2012.  Findings: Susceptibility artifact on diffusion, T1, and T2* imaging suggesting parenteral iron. Subsequently, the T1 images on this exam a.  No contrast was administered (but no contrast was given).  The appearance is also felt to explain occasional areas of heterogeneous increased diffusion signal.  Normal cerebral volume.  No ventriculomegaly. No midline shift, mass effect, or evidence of mass lesion.  Wallace Cullens and white matter signal is  within normal limits throughout the brain.  No areas suspicious for infarct. Major intracranial vascular flow voids are preserved.  Grossly negative visualized cervical spine. No acute intracranial hemorrhage identified.  Scattered paranasal sinus mucosal thickening.  Mild to moderate fluid signal in the mastoid air cells.  A small volume of retained secretions in the nasopharynx.  Visualized orbit soft tissues are within normal limits.  Visualized bone marrow signal is within normal limits. Negative scalp soft tissues.  IMPRESSION: 1.  Artifact on multiple sequences suggesting recent parenteral iron or iron overload (reportedly this patient may have recently had multiple transfusions). 2.  Otherwise negative noncontrast MRI appearance of the brain. Renal insufficiency precludes contrast. 3.  Mild to moderate paranasal sinus and mastoid inflammatory changes, clinical significance is unclear.  The mastoid findings likely are sterile effusions.  Original Report Authenticated By: Harley Hallmark, M.D.   Medications: I have reviewed the patient's current medications. Scheduled Meds:   . atovaquone  1,500 mg Oral Q breakfast  . azithromycin  600 mg Oral Daily  . darunavir  800 mg Oral Daily  . ethambutol  15 mg/kg Oral Daily  . fluconazole  100 mg Oral Q24H  . furosemide  40 mg Intravenous Once  . lamiVUDine  50 mg Oral Daily  . pantoprazole  40 mg Oral Q1200  . potassium chloride  40 mEq Oral Q4H  . potassium phosphate IVPB (mEq)  60 mEq Intravenous Once  . raltegravir  400 mg Oral BID  . ritonavir  100 mg Oral Daily  . senna  1 tablet Oral BID  . DISCONTD: fluconazole (DIFLUCAN) IV  100 mg Intravenous Q24H  . DISCONTD: potassium chloride  20 mEq Oral BID   Continuous Infusions:   . DISCONTD: dextrose 5 % 1,000 mL with potassium chloride 50 mEq, sodium bicarbonate 150 mEq infusion 200 mL/hr at 05/18/12 0800   PRN Meds:.acetaminophen, acetaminophen, ondansetron (ZOFRAN) IV, ondansetron,  promethazine Assessment/Plan: 38 man with HIV now AIDS, off ART for a year and now with CD4 < 10. Back with Dr. Drue Second and on dapsone, azithromycin, and diflucan pending ADAP to resume ART prior to admission. Adm for temp of 103 w/o localizing site of infection. Has thrush but on rx for > a week.   1. SIRS/ Fever: On admission he had a fever of ~103, was tachypneic, and was having diarrhea. Given CD4 count 10 the differential diagnosis was very wide including PCP, Cryptococcus, Giardia, cryptosporidium, microsporidia, isospora, bacteremia, UTI, pneumonia, meningitis, or encephalitis. No acute evidence for pneumonia even with repeat CXR in AM after fluids to rule out PCP. Urine unremarkable. Blood cultures times 2 drawn in ED. Vanc and Zosyn started on eve of 8/10. Pt with persistent fever,  tachycardia, tacpnea on 8/11 AM. New headache on admission but no signs or symptoms concerning for meningitis or encephalitis including neck stiffness or altered mental status. Pt also with pharyngitis x4 days, N/V/D x2 days on admission. Repeat CT head without contrast negative for acute changes, CT abdomen without contrast showing mild bowel inflammation with reactive lymph nodes. Zosyn stopped on eve of 8/11, pt started on Rocephin and flagyl IV that night as well as continued on Vancomycin. He then developed acute renal failure and with the lack of evidence of bacterial infection we stopped all antibiotics including Vanc on 8/12, other then dapsone. He was started on empiric treatment for MAC with azithromycin and Ethambutol. Pt was on dapsone 100mg  daily for PCP prophylaxis until 8/16, this was discontinued 2/2 concerns for dapsone-induced hemolytic anemia so he was switched to Atovaquone. Currently he continues on Ethambutol and azithromycin for MAC empiric tx, fluconazole 100mg  IV for thrush prophylaxis (renally dosed). WBC has been trending up and down and on 05/18/12 is up to 12.4. The patient continues to be febrile  with most Tmax running in the 102 range. LP via IR with opening pressure of 25 cm of water. CSF clear, with low glucose (42), normal protein, 1 WBC.Cryptococcus Ab negative, CSF culture pending, no organisms seen on G Stain. MRI head without contrast was negative. He was started on ART therapy by Dr. Daiva Eves on 05/17/12.  - LDH normal  -Toxoplasma antibody: negative  -CMV culture: pending  -CMV blood DNA: detected  - CMV Ab IgG>6.5  -AFB culture: negative -Blood cultures: pending  -Herpes simplex cx: pending  -Fungal culture with smear: negative  -Respiratory culture: not of lower respiratory airway  -Stool culture: no salmonella, shigella, campylobacter, Yersinia, or E. coli 0157:H7 isolated. Reduced normal flora.  -Urine culture: NGTD  -Ova and parasite: negative  - C diff stool PCR: negative on 8/13, negative on 8/16  - EGD biopsy: No yeast seen in tissue, No CMV or HSV seen in tissue culture,  - Colonoscopy biopsy: no yeast of fungal elements seen, HSV, CMV viral culture pending.  Plan:  - Follow up on cultures, including blood cultures redrawn on 8/17 - Continue to follow fever course.   2. Nausea/vomiting: Concern for bacterial vs viral illness given weakened immune system. CXR unremarkable and US abdomen unremarkable. EGD unremarkable for CMV, HSV, candida esophagitis but viral cultures pending. Colonoscopy negative for CMV/HSV, but cultures pending. He reports no N/V since evening of 8/14.  - Advanced diet to renal, but patient eating foods brought from home.   3. Acute kidney injury. On admission his creatinine was 0.91. This had trended up to 1.14 the day after admission likely secondary to increased insensible losses from tachypnea and fever as well as vomiting and diarrhea. He then received IV dye for his CT of abd and pelvis. The next morning his creatinine was up to 3.25. This has continue to trend upward to a peak of 6.40 on 8/15. Vanc level 41 (trough) on 8/13 . Random vanc  20.2 on 8/14. UA dirty, with granular casts but FeNa 0.6%. Likely cause is IV dye nephropathy in a setting of dehydration as well as a supratherapeutic Vancomycin level. Pt had NS 1L boluses x(3) on 8/13 and hydration with NS at 242ml/h. Condom cath was placed for strict I&O and the patient was oliguric on 8/14 with renal US showing no hydronephrosis or obstruction but increased renal cortical echogenicity. Since then his urine output has risen daily over the last  few days with 1.4L out yesterday. On 8/17 AM he states that he was short of breath and had to be placed on supplemental oxygen. CXR showed bilateral pleural effusions and he is about 30 lbs up since admission. He has bibasilar crackles up to his mid lung fields as well.  - Stopped IV fluids on 8/17 - Continue to supplement K with Kphos  - Lasix IV 40 mg once on 8/17 with slight bump in creatinine (5.94-->6.21). Will consider another small dose today, post transfusion.  - K still low this AM, will replete with PO [id number] - Mg nl today at 2.1. Will recheck Mg in AM  - renal doses of Lovenox to 30mg  SQ (Cr Cl <30) and fluconazole  -Continue trending Creatinine  -Condom cath for urine output  -Renal consulted, appreciate recommendations   4. Acute on chronic Anemia. On admission his HgB was 13.2 but dropped to 11.8 on the day after admission with fluids. He has continued to decrease daily for the last few days from 12--> 10.8 --> 9.6 --> 8.8 --> 7.7 --> 7.3-->6.7 today. Pt denies blood in the stools, dark tarry stools, hematuria, increased abdominal pain, or shortness of breath. Peripheral blood smear showing target cells and increased bands. Haptoglobin increased at 346, LDH elevated at 546, This is concerning for dapsone-induced hemolytic anemia. Dapsone discontinued on 8/16, pt started on atovaquone 1,500mg  for PCP prophylaxis. Direct Coombs test positive.  - Continue to monitor CBC daily  - Transfuse 1 unit of PRBC today given HgB less then 7.  Will consider 1 more unit if volume status/shortness of breath stable.  - Will consider adding folic acid and vitamin E therapy   5. Thrombocytopenia. Pt with platelets at 98 on admission, trended down to 83, now 43-->30 on 8/14. Concern for DIC thought could also be explained by AIDS and sepsis. Pt with no blood per rectum, has had sputum with blood smear. DIC panel with no schistocytes, elevated D-dimer but elevated fibrinogen. HIT score 3/8. Received 1 unit of platelets stat on 8/14 pre-colonoscopy. Platelets at 59 post transfusion and at 203 today. No bleeding reported per patient. Likely secondary to his HIV and bone marrow suppression.  -Continue monitoring.   6. Pharyngitis. Pt with hoarseness. Pt with pharyngitis >4 days on admission. Petechial pharyngitis with no exudates, no LAD. GI consulted for EGD. Preliminary report with no evidence for CMV or HSV esophagitis. Oropharynx with no thrush, but with edema and erythema.  -Viral and fungal cultures negative.  - Continue to monitor   7. Diarrhea. Improved yesterday but worse today. Pt with at least 4 days of diarrhea on admission. CT abdomen showing reactive abdominal lymph nodes. Stool sent for giardia, cryptosporidium/microsporidium but negative. O& P negative. C. Diff PCR negative x2.  - Continue to monitor.   8. AIDS: CD4 last 10, has been off ARVs for some time now and is in process of getting back on medications. No concern for IRIS as he had not started meds yet. Case manager to assist pt with emergency Medicaid for anti-retrovirals. Pt states that only his pastor knows of his HIV status, will continue caution. Dr. Daiva Eves following and has suggested Prezista, EPivir, Isentress, and Norvir which were started on 05/17/12.  - PPX with atovaquone and azithromycin and fluconazole  - Per ID: emergency medicaid  - Will avoid truvada 2/2 nephrotoxicity   9. Pruritis: resolved now. No rashes seen on physical exam. Sx could have been from  Dapsone. Pt now off Dapsone.  10. Dispo: Patient to remain in SDU for now with increased SOB and continued fever. Eating better. Will be ready for D/C when afebrile for 24 hours and able to get his medications as an outpatient.      LOS: 8 days   Ky Barban 05/19/2012, 8:59 AM

## 2012-05-19 NOTE — Progress Notes (Signed)
CRITICAL VALUE ALERT  Critical value received: HGB 6.7  Date of notification:  18 Aug !3  Time of notification:  0612  Critical value read back:yes  Nurse who received alert:  Corrie Mckusick  MD notified (1st page):  Dr Jenetta Loges  Time of first page:  0615  MD notified (2nd page):  Time of second page:  Responding MD:  Dr. Jenetta Loges  Time MD responded:0620

## 2012-05-20 ENCOUNTER — Encounter (HOSPITAL_COMMUNITY): Payer: Self-pay | Admitting: Oncology

## 2012-05-20 ENCOUNTER — Inpatient Hospital Stay (HOSPITAL_COMMUNITY): Payer: Medicaid Other

## 2012-05-20 DIAGNOSIS — R0989 Other specified symptoms and signs involving the circulatory and respiratory systems: Secondary | ICD-10-CM

## 2012-05-20 DIAGNOSIS — Z21 Asymptomatic human immunodeficiency virus [HIV] infection status: Secondary | ICD-10-CM

## 2012-05-20 DIAGNOSIS — J984 Other disorders of lung: Secondary | ICD-10-CM

## 2012-05-20 DIAGNOSIS — D649 Anemia, unspecified: Secondary | ICD-10-CM | POA: Insufficient documentation

## 2012-05-20 DIAGNOSIS — R0600 Dyspnea, unspecified: Secondary | ICD-10-CM | POA: Diagnosis present

## 2012-05-20 DIAGNOSIS — N179 Acute kidney failure, unspecified: Secondary | ICD-10-CM | POA: Insufficient documentation

## 2012-05-20 DIAGNOSIS — D6489 Other specified anemias: Secondary | ICD-10-CM

## 2012-05-20 LAB — PNEUMOCYSTIS JIROVECI SMEAR BY DFA: Pneumocystis jiroveci Ag: NEGATIVE

## 2012-05-20 LAB — CMV (CYTOMEGALOVIRUS) DNA ULTRAQUANT, PCR

## 2012-05-20 LAB — HEPATIC FUNCTION PANEL
ALT: 26 U/L (ref 0–53)
AST: 59 U/L — ABNORMAL HIGH (ref 0–37)
Alkaline Phosphatase: 87 U/L (ref 39–117)
Bilirubin, Direct: 0.6 mg/dL — ABNORMAL HIGH (ref 0.0–0.3)
Total Bilirubin: 1.1 mg/dL (ref 0.3–1.2)

## 2012-05-20 LAB — CBC WITH DIFFERENTIAL/PLATELET
Basophils Relative: 0 % (ref 0–1)
Eosinophils Absolute: 0.6 10*3/uL (ref 0.0–0.7)
Hemoglobin: 7 g/dL — ABNORMAL LOW (ref 13.0–17.0)
Lymphs Abs: 0.5 10*3/uL — ABNORMAL LOW (ref 0.7–4.0)
MCH: 27.1 pg (ref 26.0–34.0)
MCHC: 34.8 g/dL (ref 30.0–36.0)
MCV: 77.9 fL — ABNORMAL LOW (ref 78.0–100.0)
Monocytes Absolute: 0.3 10*3/uL (ref 0.1–1.0)
Neutro Abs: 14.6 10*3/uL — ABNORMAL HIGH (ref 1.7–7.7)
Neutrophils Relative %: 91 % — ABNORMAL HIGH (ref 43–77)

## 2012-05-20 LAB — BLOOD GAS, ARTERIAL
Acid-base deficit: 2.6 mmol/L — ABNORMAL HIGH (ref 0.0–2.0)
Bicarbonate: 21.1 mEq/L (ref 20.0–24.0)
TCO2: 22.1 mmol/L (ref 0–100)
pCO2 arterial: 32.6 mmHg — ABNORMAL LOW (ref 35.0–45.0)
pH, Arterial: 7.427 (ref 7.350–7.450)

## 2012-05-20 LAB — RENAL FUNCTION PANEL
CO2: 22 mEq/L (ref 19–32)
Calcium: 8 mg/dL — ABNORMAL LOW (ref 8.4–10.5)
Creatinine, Ser: 6.07 mg/dL — ABNORMAL HIGH (ref 0.50–1.35)
GFR calc non Af Amer: 11 mL/min — ABNORMAL LOW (ref >90)

## 2012-05-20 LAB — CBC
HCT: 23.7 % — ABNORMAL LOW (ref 39.0–52.0)
MCH: 27.9 pg (ref 26.0–34.0)
MCHC: 34.6 g/dL (ref 30.0–36.0)
RDW: 15.3 % (ref 11.5–15.5)

## 2012-05-20 LAB — CSF CULTURE W GRAM STAIN: Culture: NO GROWTH

## 2012-05-20 LAB — RETICULOCYTES
RBC.: 2.94 MIL/uL — ABNORMAL LOW (ref 4.22–5.81)
Retic Ct Pct: 1 % (ref 0.4–3.1)

## 2012-05-20 LAB — IRON AND TIBC
Iron: 225 ug/dL — ABNORMAL HIGH (ref 42–135)
TIBC: 288 ug/dL (ref 215–435)

## 2012-05-20 LAB — HEPATITIS B DNA, ULTRAQUANTITATIVE, PCR

## 2012-05-20 LAB — FERRITIN: Ferritin: 8415 ng/mL — ABNORMAL HIGH (ref 22–322)

## 2012-05-20 LAB — VIRAL CULTURE VIRC

## 2012-05-20 LAB — CYTOMEGALOVIRUS PCR, QUALITATIVE: Cytomegalovirus DNA: DETECTED — AB

## 2012-05-20 LAB — PREPARE RBC (CROSSMATCH)

## 2012-05-20 LAB — VITAMIN B12: Vitamin B-12: 420 pg/mL (ref 211–911)

## 2012-05-20 MED ORDER — LAMIVUDINE 10 MG/ML PO SOLN: 100.0000 mg | Freq: Every day | ORAL | Status: DC

## 2012-05-20 MED ORDER — FUROSEMIDE 10 MG/ML IJ SOLN
INTRAMUSCULAR | Status: AC
  Filled 2012-05-20: qty 4

## 2012-05-20 MED ORDER — FUROSEMIDE 10 MG/ML IJ SOLN
20.0000 mg | Freq: Once | INTRAMUSCULAR | Status: AC
  Administered 2012-05-20: 20 mg via INTRAVENOUS

## 2012-05-20 MED ORDER — DARBEPOETIN ALFA-POLYSORBATE 200 MCG/0.4ML IJ SOLN
200.0000 ug | Freq: Once | INTRAMUSCULAR | Status: AC
  Administered 2012-05-20: 200 ug via SUBCUTANEOUS
  Filled 2012-05-20: qty 0.4

## 2012-05-20 MED ORDER — SODIUM CHLORIDE 0.9 % IV SOLN
250.0000 mg | Freq: Two times a day (BID) | INTRAVENOUS | Status: DC
  Administered 2012-05-20: 250 mg via INTRAVENOUS
  Filled 2012-05-20 (×2): qty 250

## 2012-05-20 NOTE — Progress Notes (Signed)
Dr Manson Passey on floor spent 45 min plus talking to patient about treatment plan. Pt and POA saying they are going to leave AMA.  Dr Manson Passey attempted to convince pt to stay in hospital. Including likely hood that pt  wouldn't  Likely make it to another hospital.   And that insurance would not pay If he left against medical advice. Pt and POA still were adamant that they were leaving.  I again told pt that he wouldn't  Likely survive the trip and  That his insurance  Would not pay for his hospital stay.  PIV d/c tip intact pt's care giver refused to have nurse tech  help pt to car.   Dr Manson Passey notified.

## 2012-05-20 NOTE — Progress Notes (Signed)
Called into patient's room for patient being SOB; o2 stats in the 90's (patient sitting upright 40% venti-mask placed on patient and RT called to administer breathing treatment).  Patient's resps in the 40's and patient is very diaphoretic and HR in the 130's (ST). MD notified and states they are on their way to assess patient.

## 2012-05-20 NOTE — Progress Notes (Signed)
Subjective: He continues to have abdominal pain. He had diarrhea multiple times yesterday with no blood in his stool. He has had no diarrhea so far this morning.   His urine output yesterday was ~3.1L.   He denies confusion, neck pain or stiffness, N/V, or hematuria.  Objective: Vital signs in last 24 hours: Filed Vitals:   05/20/12 0340 05/20/12 0545 05/20/12 0700 05/20/12 0800  BP: 131/91   123/61  Pulse: 113 131  117  Temp: 98.4 F (36.9 C) 101.5 F (38.6 C) 99.6 F (37.6 C) 98.6 F (37 C)  TempSrc: Oral Oral Oral Oral  Resp: 28 27  24   Height:      Weight: 216 lb 7.9 oz (98.2 kg)     SpO2: 98% 96%  97%   Weight change: -13 lb 0.1 oz (-5.9 kg)  Intake/Output Summary (Last 24 hours) at 05/20/12 0927 Last data filed at 05/20/12 0600  Gross per 24 hour  Intake  930.5 ml  Output   2910 ml  Net -1979.5 ml  Vitals reviewed.  General: lying in bed, reading a magazine, in NAD.   HEENT: no scleral icterus, hoarseness.   Cardiac: tachycardic but regular, no rubs, murmurs or gallops  Pulm: Bibasilar crackles, Tachypnea, no wheezes, or rhonchi  Abd: soft, mild diffuse tenderness, distended, BS present in 4 quadrants.  Ext: warm and well perfused, 1+ pitting edema to the knees.  Neuro: alert and oriented X3, cranial nerves II-XII grossly intact, strength and sensation to light touch equal in bilateral upper and lower extremities, normal muscle tone and bulk in UE and LE.   Lab Results: Basic Metabolic Panel:  Lab 05/20/12 9604 05/19/12 0505  NA 138 140  K 3.5 3.0*  CL 102 100  CO2 22 26  GLUCOSE 88 95  BUN 57* 58*  CREATININE 6.07* 6.21*  CALCIUM 8.0* 7.9*  MG 1.9 2.1  PHOS 4.5 3.8   Liver Function Tests:  Lab 05/20/12 0500 05/19/12 0505 05/14/12 0530  AST 59* -- 77*  ALT 26 -- 67*  ALKPHOS 87 -- 133*  BILITOT 1.1 -- 0.9  PROT 6.2 -- 6.5  ALBUMIN 2.0*2.1* 2.1* --   CBC:  Lab 05/20/12 0500 05/19/12 1930  WBC 16.0* 15.2*  NEUTROABS 14.6* 13.3*  HGB 7.0* 7.5*   HCT 20.1* 21.4*  MCV 77.9* 77.8*  PLT 212 215   D-Dimer:  Lab 05/14/12 0954  DDIMER 7.59*   Coagulation:  Lab 05/14/12 0954  LABPROT 17.2*  INR 1.38   Anemia Panel:  Lab 05/17/12 0910 05/15/12 0425  VITAMINB12 -- --  FOLATE -- --  FERRITIN -- --  TIBC -- 149*  IRON -- 28*  RETICCTPCT 0.4 --   Urinalysis:  Lab 05/13/12 2249  COLORURINE ORANGE*  LABSPEC 1.027  PHURINE 5.0  GLUCOSEU NEGATIVE  HGBUR LARGE*  BILIRUBINUR SMALL*  KETONESUR 15*  PROTEINUR 100*  UROBILINOGEN 0.2  NITRITE POSITIVE*  LEUKOCYTESUR MODERATE*   Misc. Labs: C. Diff: Neg  DAT: Positive for complement  LDH: 546  Haptoglobin: 346  Crypto CSF: Neg  Crypto Blood: Neg  CMV CSF: Pending  Giardia/Cryptosporidium: Neg  Colonoscopy HSV/CMV: Pending  AM Cortisol 26.5  HLA B*5701: Pending  Fibrinogen: 705  EGD HSV and CMV: Neg  Microsporidia: Neg  O&P: Neg  CMV IgG: Positive  CMV DNA: Detected CMV DNA quantity: <200   Micro Results: Recent Results (from the past 240 hour(s))  URINE CULTURE     Status: Normal   Collection Time  05/11/12 12:18 PM      Component Value Range Status Comment   Specimen Description URINE, CATHETERIZED   Final    Special Requests NONE   Final    Culture  Setup Time 05/11/2012 22:56   Final    Colony Count NO GROWTH   Final    Culture NO GROWTH   Final    Report Status 05/12/2012 FINAL   Final   CULTURE, BLOOD (ROUTINE X 2)     Status: Normal   Collection Time   05/11/12  4:10 PM      Component Value Range Status Comment   Specimen Description BLOOD LEFT ARM   Final    Special Requests BOTTLES DRAWN AEROBIC AND ANAEROBIC 10CC   Final    Culture  Setup Time 05/11/2012 22:55   Final    Culture NO GROWTH 5 DAYS   Final    Report Status 05/17/2012 FINAL   Final   CULTURE, BLOOD (ROUTINE X 2)     Status: Normal   Collection Time   05/11/12  4:25 PM      Component Value Range Status Comment   Specimen Description BLOOD LEFT HAND   Final    Special  Requests BOTTLES DRAWN AEROBIC AND ANAEROBIC 10CC   Final    Culture  Setup Time 05/11/2012 22:55   Final    Culture NO GROWTH 5 DAYS   Final    Report Status 05/17/2012 FINAL   Final   AFB CULTURE, BLOOD     Status: Normal (Preliminary result)   Collection Time   05/12/12 12:33 PM      Component Value Range Status Comment   Specimen Description BLOOD LEFT ARM   Final    Special Requests BOTTLES DRAWN AEROBIC ONLY 3CC   Final    Culture     Final    Value: CULTURE WILL BE EXAMINED FOR 6 WEEKS BEFORE ISSUING A FINAL REPORT   Report Status PENDING   Incomplete   MRSA PCR SCREENING     Status: Normal   Collection Time   05/12/12  3:35 PM      Component Value Range Status Comment   MRSA by PCR NEGATIVE  NEGATIVE Final   CULTURE, EXPECTORATED SPUTUM-ASSESSMENT     Status: Normal   Collection Time   05/12/12  5:04 PM      Component Value Range Status Comment   Specimen Description SPUTUM   Final    Special Requests NONE   Final    Sputum evaluation     Final    Value: MICROSCOPIC FINDINGS SUGGEST THAT THIS SPECIMEN IS NOT REPRESENTATIVE OF LOWER RESPIRATORY SECRETIONS. PLEASE RECOLLECT.     CALLED TO MATTHEWS,M RN 05/12/12 1745 WOOTEN,K   Report Status 05/12/2012 FINAL   Final   STOOL CULTURE     Status: Normal   Collection Time   05/12/12  7:01 PM      Component Value Range Status Comment   Specimen Description STOOL   Final    Special Requests Immunocompromised   Final    Culture     Final    Value: NO SALMONELLA, SHIGELLA, CAMPYLOBACTER, YERSINIA, OR E.COLI 0157:H7 ISOLATED     Note: REDUCED NORMAL FLORA PRESENT   Report Status 05/16/2012 FINAL   Final   OVA AND PARASITE EXAMINATION     Status: Normal   Collection Time   05/12/12  7:01 PM      Component Value Range Status Comment   Specimen  Description STOOL   Final    Special Requests NONE   Final    Ova and parasites NO OVA OR PARASITES SEEN   Final    Report Status 05/15/2012 FINAL   Final   CRYPTOSPORIDIUM SMEAR, FECAL      Status: Normal   Collection Time   05/12/12  7:01 PM      Component Value Range Status Comment   Specimen Description STOOL   Final    Special Requests NONE   Final    Cryptosporidium Smear. NO Cryptosporidium Cyclospora or Isospora seen.   Final    Report Status 05/14/2012 FINAL   Final   AFB CULTURE, BLOOD     Status: Normal (Preliminary result)   Collection Time   05/13/12  9:35 AM      Component Value Range Status Comment   Specimen Description BLOOD LEFT ARM   Final    Special Requests 5CC   Final    Culture     Final    Value: CULTURE WILL BE EXAMINED FOR 6 WEEKS BEFORE ISSUING A FINAL REPORT   Report Status PENDING   Incomplete   CULTURE, EXPECTORATED SPUTUM-ASSESSMENT     Status: Normal   Collection Time   05/13/12 11:03 AM      Component Value Range Status Comment   Specimen Description SPUTUM   Final    Special Requests Immunocompromised   Final    Sputum evaluation     Final    Value: MICROSCOPIC FINDINGS SUGGEST THAT THIS SPECIMEN IS NOT REPRESENTATIVE OF LOWER RESPIRATORY SECRETIONS. PLEASE RECOLLECT.     Gram Stain Report Called to,Read Back By and Verified With: Ernestina Penna RN 12:05 05/13/12 ([email or web address])   Report Status 05/13/2012 FINAL   Final   FUNGUS CULTURE W SMEAR     Status: Normal (Preliminary result)   Collection Time   05/13/12  4:41 PM      Component Value Range Status Comment   Specimen Description TISSUE ESOPHAGUS   Final    Special Requests NONE   Final    Fungal Smear NO YEAST OR FUNGAL ELEMENTS SEEN   Final    Culture CULTURE IN PROGRESS FOR FOUR WEEKS   Final    Report Status PENDING   Incomplete   VIRAL CULTURE VIRC     Status: Normal   Collection Time   05/13/12  4:41 PM      Component Value Range Status Comment   Specimen Description TISSUE ESOPHAGUS LOOK FOR CMV AND HERPES   Final    Special Requests NONE   Final    Culture     Final    Value: No Herpes Simplex Detected in Cell Culture No Cytomegalovirus identified in cell culture                                                                 A negative result does not exclude the possibility of CMV infection;inappropriate specimen      collection,storage and transport may lead to false negative results.   Report Status 05/17/2012 FINAL   Final   CLOSTRIDIUM DIFFICILE BY PCR     Status: Normal   Collection Time   05/14/12  3:53 PM      Component Value Range Status Comment  C difficile by pcr NEGATIVE  NEGATIVE Final   FUNGUS CULTURE W SMEAR     Status: Normal (Preliminary result)   Collection Time   05/15/12  3:54 PM      Component Value Range Status Comment   Specimen Description TISSUE   Final    Special Requests     Final    Value: SAMPLE NO 1 SPECIMEN IN CUP TRANSVERSE AND DESC COLON   Fungal Smear NO YEAST OR FUNGAL ELEMENTS SEEN   Final    Culture CULTURE IN PROGRESS FOR FOUR WEEKS   Final    Report Status PENDING   Incomplete   VIRAL CULTURE VIRC     Status: Normal (Preliminary result)   Collection Time   05/15/12  3:54 PM      Component Value Range Status Comment   Specimen Description BIOPSY SIGMOID AND RECTAL LOOK FOR HSV AND CMV   Final    Special Requests NONE   Final    Culture Culture has been initiated.   Final    Report Status PENDING   Incomplete   CSF CULTURE     Status: Normal (Preliminary result)   Collection Time   05/16/12  5:20 PM      Component Value Range Status Comment   Specimen Description CSF   Final    Special Requests TUBE 2@6 .5CC   Final    Gram Stain     Final    Value: CYTOSPIN WBC PRESENT,BOTH PMN AND MONONUCLEAR     NO ORGANISMS SEEN     Performed at [facility]   Culture NO GROWTH 2 DAYS   Final    Report Status PENDING   Incomplete   GRAM STAIN     Status: Normal   Collection Time   05/16/12  5:20 PM      Component Value Range Status Comment   Specimen Description CSF   Final    Special Requests TUBE 2@6 .5CC   Final    Gram Stain     Final    Value: CYTOSPIN SLIDE     NO ORGANISMS SEEN     WBC PRESENT,BOTH PMN AND  MONONUCLEAR   Report Status 05/16/2012 FINAL   Final   TOXOPLASMA GONDII, PCR     Status: Normal   Collection Time   05/16/12  5:20 PM      Component Value Range Status Comment   Source - TGPCR CSF   Final    Toxoplasma gondii, DNA ql Not Detected  Not Detected Final   CLOSTRIDIUM DIFFICILE BY PCR     Status: Normal   Collection Time   05/17/12  7:09 PM      Component Value Range Status Comment   C difficile by pcr NEGATIVE  NEGATIVE Final   CULTURE, BLOOD (ROUTINE X 2)     Status: Normal (Preliminary result)   Collection Time   05/18/12  6:23 PM      Component Value Range Status Comment   Specimen Description BLOOD RIGHT ARM   Final    Special Requests BOTTLES DRAWN AEROBIC AND ANAEROBIC 10CC   Final    Culture  Setup Time 05/19/2012 03:55   Final    Culture     Final    Value:        BLOOD CULTURE RECEIVED NO GROWTH TO DATE CULTURE WILL BE HELD FOR 5 DAYS BEFORE ISSUING A FINAL NEGATIVE REPORT   Report Status PENDING   Incomplete    Studies/Results:  No results found. Medications: I have reviewed the patient's current medications. Scheduled Meds:   . atovaquone  1,500 mg Oral Q breakfast  . azithromycin  600 mg Oral Daily  . darunavir  800 mg Oral Daily  . ethambutol  15 mg/kg Oral Daily  . fluconazole  100 mg Oral Q24H  . furosemide  20 mg Intravenous Once  . lamiVUDine  50 mg Oral Daily  . pantoprazole  40 mg Oral Q1200  . potassium chloride  40 mEq Oral Q4H  . raltegravir  400 mg Oral BID  . ritonavir  100 mg Oral Daily  . senna  1 tablet Oral BID  . white petrolatum       Continuous Infusions:  PRN Meds:.acetaminophen, acetaminophen, albuterol, ipratropium, lidocaine-prilocaine, ondansetron (ZOFRAN) IV, ondansetron, promethazine Assessment/Plan: 44 man with HIV now AIDS, off ART for a year and now with CD4 < 10. Back with Dr. Drue Second and on dapsone, azithromycin, and diflucan pending ADAP to resume ART prior to admission. Adm for temp of 103 w/o localizing site of  infection. Has thrush but on rx for > a week.   1. SIRS/ Fever: On admission he had a fever of ~103, was tachypneic, and was having diarrhea. Given CD4 count 10 the differential diagnosis was very wide including PCP, Cryptococcus, Giardia, cryptosporidium, microsporidia, isospora, bacteremia, UTI, pneumonia, meningitis, or encephalitis. No acute evidence for pneumonia even with repeat CXR in AM after fluids to rule out PCP. Urine unremarkable. Blood cultures times 2 drawn in ED. Vanc and Zosyn started on eve of 8/10. Pt with persistent fever, tachycardia, tacpnea on 8/11 AM. New headache on admission but no signs or symptoms concerning for meningitis or encephalitis including neck stiffness or altered mental status. Pt also with pharyngitis x4 days, N/V/D x2 days on admission. Repeat CT head without contrast negative for acute changes, CT abdomen without contrast showing mild bowel inflammation with reactive lymph nodes. Zosyn stopped on eve of 8/11, pt started on Rocephin and flagyl IV that night as well as continued on Vancomycin. He then developed acute renal failure and with the lack of evidence of bacterial infection we stopped all antibiotics including Vanc on 8/12, other then dapsone. He was started on empiric treatment for MAC with azithromycin and Ethambutol. Pt was on dapsone 100mg  daily for PCP prophylaxis until 8/16, this was discontinued 2/2 concerns for dapsone-induced hemolytic anemia so he was switched to Atovaquone. Currently he continues on Ethambutol and azithromycin for MAC empiric tx, fluconazole 100mg  IV for thrush prophylaxis (renally dosed). WBC has been trending up and down and on 05/18/12 is up to 12.4. The patient continues to be febrile with most Tmax running in the 102 range. LP via IR with opening pressure of 25 cm of water. CSF clear, with low glucose (42), normal protein, 1 WBC.Cryptococcus Ab negative, CSF culture pending, no organisms seen on G Stain. MRI head without contrast was  negative. He was started on ART therapy by Dr. Daiva Eves on 05/17/12.  - LDH normal  -Toxoplasma antibody: negative  -CMV culture: pending  -CMV blood DNA: detected  - CMV Ab IgG>6.5  -AFB culture: negative  -Blood cultures: pending  -Herpes simplex cx: pending  -Fungal culture with smear: negative  -Respiratory culture: not of lower respiratory airway  -Stool culture: no salmonella, shigella, campylobacter, Yersinia, or E. coli 0157:H7 isolated. Reduced normal flora.  -Urine culture: NGTD  -Ova and parasite: negative  - C diff stool PCR: negative on 8/13, negative on 8/16  -  EGD biopsy: No yeast seen in tissue, No CMV or HSV seen in tissue culture,  - Colonoscopy biopsy: no yeast of fungal elements seen, HSV, CMV viral culture pending.  Plan:  - Follow up on cultures, including blood cultures redrawn on 8/17  - Continue to follow fever course.  - Pathology, Dr. Luisa Hart running histochemistry stains and AFB for colonoscopy tissue for CMV, HSV, and MAC, results to be reported tomorrow.   2. Nausea/vomiting: Concern for bacterial vs viral illness given weakened immune system. CXR unremarkable and US abdomen unremarkable. EGD unremarkable for CMV, HSV, candida esophagitis but viral cultures pending. Colonoscopy negative for CMV/HSV colitis pattern, but cultures and histochemistry pending. He reports no N/V since evening of 8/14.  - Advanced diet to renal, but patient eating foods brought from home.   3. Acute kidney injury. On admission his creatinine was 0.91. This had trended up to 1.14 the day after admission likely secondary to increased insensible losses from tachypnea and fever as well as vomiting and diarrhea. He then received IV dye for his CT of abd and pelvis. The next morning his creatinine was up to 3.25. This has continue to trend upward to a peak of 6.40 on 8/15. Vanc level 41 (trough) on 8/13 . Random vanc 20.2 on 8/14. UA dirty, with granular casts but FeNa 0.6%. Likely cause is IV  dye nephropathy in a setting of dehydration as well as a supratherapeutic Vancomycin level. Pt had NS 1L boluses x(3) on 8/13 and hydration with NS at 274ml/h. Condom cath was placed for strict I&O and the patient was oliguric on 8/14 with renal US showing no hydronephrosis or obstruction but increased renal cortical echogenicity. Since then his urine output has risen daily over the last few days with 3.1L out yesterday. On 8/17 AM he states that he was short of breath and had to be placed on supplemental oxygen. CXR showed bilateral pleural effusions and he was about 30 lbs up since admission. He has bibasilar crackles.  - Stopped IV fluids on 8/17  - Continue to supplement K with Kphos  - Lasix IV 40 mg once on 8/17 with slight bump in creatinine (5.94-->6.21). Will consider small doses as needed, especially, post transfusion.  - K still low yesterday AM, repleted with PO 25mEQx4  - K normal today - Mg nl today at 1.9. Will replete if below 1.6. Will recheck Mg in AM  - renal doses of Lovenox to 30mg  SQ (Cr Cl <30) and fluconazole  -Continue trending Creatinine  -Condom cath for urine output  -Renal consulted, appreciate recommendations   4. Acute on chronic Anemia. On admission his HgB was 13.2 but dropped to 11.8 on the day after admission with fluids. He has continued to decrease daily for the last few days from 12--> 10.8 --> 9.6 --> 8.8 --> 7.7 --> 7.3-->6.7 on 8/18. Pt denies blood in the stools, dark tarry stools, hematuria, increased abdominal pain, or shortness of breath. Peripheral blood smear showing target cells and increased bands. Haptoglobin increased at 346, LDH elevated at 546, This is concerning for dapsone-induced hemolytic anemia. Dapsone discontinued on 8/16, pt started on atovaquone 1,500mg  for PCP prophylaxis. Direct Coombs test positive.  - Continue to monitor CBC daily  - Transfused 1 unit of PRBC on 8/18 given HgB less then 7, Hg initially up to 7.6 post-transfusion, but  down to 7.0 this morning.  -Will transfuse 1 more unit today followed by Lasix 20mg  once.   - Will consider adding  folic acid and vitamin E therapy  - Heme consult. Appreciate recommendations.   5. Thrombocytopenia. Pt with platelets at 98 on admission, trended down to 83, now 43-->30 on 8/14. Concern for DIC thought could also be explained by AIDS and sepsis. Pt with no blood per rectum, has had sputum with blood smear. DIC panel with no schistocytes, elevated D-dimer but elevated fibrinogen. HIT score 3/8. Received 1 unit of platelets stat on 8/14 pre-colonoscopy. Platelets at 59 post transfusion and at 212 today. No bleeding reported per patient. Likely secondary to his HIV and bone marrow suppression.  -Continue monitoring.   6. Pharyngitis. Pt with hoarseness. Pt with pharyngitis >4 days on admission. Petechial pharyngitis with no exudates, no LAD. GI consulted for EGD. Preliminary report with no evidence for CMV or HSV esophagitis. Oropharynx with no thrush, but with edema and erythema.  -Viral and fungal cultures negative.  - Continue to monitor   7. Diarrhea. Worse yesterday, no BM today. Pt with at least 4 days of diarrhea on admission. CT abdomen showing reactive abdominal lymph nodes. Stool sent for giardia, cryptosporidium/microsporidium but negative. O& P negative. C. Diff PCR negative x2.  - Continue to monitor.   8. AIDS: CD4 last 10, has been off ARVs for some time now and is in process of getting back on medications. No concern for IRIS as he had not started meds yet. Case manager to assist pt with emergency Medicaid for anti-retrovirals. Pt states that only his pastor knows of his HIV status, will continue caution. Dr. Daiva Eves following and has suggested Prezista, EPivir, Isentress, and Norvir which were started on 05/17/12.  - PPX with atovaquone and azithromycin and fluconazole  - Per ID: emergency medicaid  - Will avoid truvada 2/2 nephrotoxicity   9. Pruritis: resolved now.  No rashes seen on physical exam. Sx could have been from Dapsone. Pt now off Dapsone.   10. Dispo: Patient to remain in SDU for now with increased SOB and continued fever. Eating better. Will be ready for D/C when afebrile for 24 hours and able to get his medications as an outpatient.   LOS: 9 days   Ky Barban 05/20/2012, 9:27 AM

## 2012-05-20 NOTE — Progress Notes (Signed)
Internal Medicine Progress Note  I was called to the patient's bedside, and arrived around 8:00 pm.  After confirming with the patient alone that he consented to me discussing details of his medical care with his pastor, the three of [place] sat down for a conversation.  The pastor expressed multiple grievances, which summarized included perceptions of fragmentation of care with frustration about multiple physicians seemingly not discussing plans with one another, frustrations with staff members with perceived "talking behind my back" regarding personal details of the patient's life, and ultimately asking about options regarding transfer of care to [facility].  We discussed the patient's medical course to date, including the severity of his low CD4 count, and the wide range of potential etiologies of his symptoms of diarrhea, fever, tachycardia, and tachypnea in the setting of a low CD4 count.  We discussed the fact that although cultures have been negative to date, we still felt that he needed antibiotics to treat possible pneumonia based on his chest x-ray results today.  I explained that his current respiratory distress may be due to mild volume overload secondary to his blood transfusion in the setting of AKI.  I expressed my apologies for any perceived insult or unprofessional behavior on behalf of myself or the staff, though the patient stated that nothing I personally had said had upset/offended him.  I discussed his legal options for transfer to [facility], including transfer for medical necessity (which I informed him he may not qualify for, but that the daytime medical team would know the answer to this question better than I would), chartering a private ambulance, and signing out AMA.  The patient and the pastor said that they plan on signing out AMA tonight.  I told the patient that I was literally begging him not to leave, that I was considerably concerned about his medical condition tonight, and that  any gap in treatment (including a car ride to [place] without oxygen or other medical monitoring equipment) could substantially worsen his condition, and may result in death.  I also explained the fact that all hospitals have less staff members working at night, and that there may be longer ED wait times at [facility] at night than there would be in the morning.  The patient's nurse also explained that the patient would be solely financially responsible for his hospital stay here if he left AMA, and that he would not receive any insurance coverage to help pay for this hospitalization.  The patient and pastor thanked me for my time.  Total time spent in the room was approximately 75 minutes.  At the end of the conversation, the patient remained undecided about leaving AMA.  Around 9:45 pm, I received a call from the patient's nurse that the patient did decide to leave AMA, and signed the required paperwork.  I asked the nurse to confirm with the patient once more that he understood the risk of harm and possible death if he left AMA, and the patient again confirmed that he understood.  Signed, Janalyn Harder, MD Internal Medicine Teaching Service 05/20/2012, 10:01 PM

## 2012-05-20 NOTE — Progress Notes (Signed)
Internal Medicine Attending  Date: 05/20/2012  Patient name: Zachary Hill Medical record number: [id number] Date of birth: March 20, 1981 Age: 31 y.o. Gender: male  I saw and evaluated the patient. I reviewed the resident's note by Dr. Garald Braver and I agree with the resident's findings and plans as documented in her note.

## 2012-05-20 NOTE — Progress Notes (Signed)
ID: Zachary Hill   DOB: 26-Jul-1981  MR#: [id number]  CSN#:[id number]  HISTORY OF PRESENT ILLNESS: 31 year old [place] man, HIV positive with a low CD4 count, followed by Zachary Hill, admitted with high fevers, nausea, vomiting, diarrhea; initially with normal creatinine, now with ARF felt to be at least partly secondary to IV contrast; with initial Hb 13.2, dropping to 11.8 with hydration, stable the next day at 12.0, then dropping by about 1.1/ day to nadir 6.7 yesterday; s/p PRBC transfusion 8/18 and 8/19 with Hb now 8.2. We were consulted for further evaluation and in particular for consideration of bone-marrow biopsy to r/o MAC in the marrow  REVIEW OF SYSTEMS: He had one mushy but otherwise normal-looking BM today; making "plenty" of urine; feels SOB, denies cough, phlegm, hemoptysis, or pleurisy; abdomen hurts. Friends in room  PAST MEDICAL HISTORY: Past Medical History  Diagnosis Date  . HIV disease     CD4 = 10, VL = 475K, 03/21/12.  Medication non-compliance  . Hepatitis B     03/21/12 - sAg neg, sAb pos, cAb pos, indication prior infection  . Acute renal failure   . Anemia     PAST SURGICAL HISTORY: Past Surgical History  Procedure Date  . Esophagogastroduodenoscopy 05/13/2012    Procedure: ESOPHAGOGASTRODUODENOSCOPY (EGD);  Surgeon: Zachary Friar, MD;  Location: [facility] ENDOSCOPY;  Service: Endoscopy;  Laterality: N/A;  . Colonoscopy 05/15/2012    Procedure: COLONOSCOPY;  Surgeon: Zachary Friar, MD;  Location: [facility] ENDOSCOPY;  Service: Endoscopy;  Laterality: N/A;  unprepped    FAMILY HISTORY History reviewed. No pertinent family history.  SOCIAL HISTORY: Patient lives currently with his pastor, Zachary Hill; he tells me if he becomes incapacitated he would like Zachary Hill to make decisions, and that there is a health care power of attorney document in place stating that  ADVANCED DIRECTIVES:  HEALTH MAINTENANCE: History  Substance Use Topics  .  Smoking status: Current Everyday Smoker -- 0.2 packs/day    Types: Cigarettes  . Smokeless tobacco: Never Used  . Alcohol Use: 1.5 oz/week    3 drink(s) per week     red wine , social      Allergies  Allergen Reactions  . Fruit & Vegetable Daily (Nutritional Supplements)     Allergic to RAW fruits and vegetables .  Marland Kitchen Bactrim (Sulfamethoxazole W-Trimethoprim) Itching, Rash and Other (See Comments)    "flu like symptoms"    Current Facility-Administered Medications  Medication Dose Route Frequency Provider Last Rate Last Dose  . acetaminophen (TYLENOL) suppository 650 mg  650 mg Rectal Q4H PRN Zachary Nevins, MD   650 mg at 05/12/12 2322   Or  . acetaminophen (TYLENOL) tablet 650 mg  650 mg Oral Q6H PRN Zachary Nevins, MD   650 mg at 05/20/12 1233  . albuterol (PROVENTIL) (5 MG/ML) 0.5% nebulizer solution 2.5 mg  2.5 mg Nebulization Q4H PRN Zachary Sias, MD   2.5 mg at 05/20/12 1433   And  . ipratropium (ATROVENT) nebulizer solution 0.5 mg  0.5 mg Nebulization Q4H PRN Zachary Sias, MD   0.5 mg at 05/20/12 1433  . atovaquone (MEPRON) 750 MG/5ML suspension 1,500 mg  1,500 mg Oral Q breakfast Zachary Sias, MD   1,500 mg at 05/20/12 2130  . azithromycin (ZITHROMAX) tablet 600 mg  600 mg Oral Daily Zachary Sias, MD   600 mg at 05/20/12 1000  . Darunavir Ethanolate (PREZISTA) tablet 800 mg  800 mg Oral Daily Zachary Reining  Dam, MD   800 mg at 05/20/12 1000  . ethambutol (MYAMBUTOL) tablet 15 mg/kg  15 mg/kg Oral Daily Zachary Deforest, MD      . fluconazole (DIFLUCAN) tablet 100 mg  100 mg Oral Q24H Zachary Ly, MD   100 mg at 05/20/12 4098  . furosemide (LASIX) 10 MG/ML injection           . furosemide (LASIX) 10 MG/ML injection           . furosemide (LASIX) injection 20 mg  20 mg Intravenous Once Zachary Barban, MD   20 mg at 05/20/12 1325  . furosemide (LASIX) injection 20 mg  20 mg Intravenous Once Zachary Dad, MD   20 mg at 05/20/12 1535  .  imipenem-cilastatin (PRIMAXIN) 250 mg in sodium chloride 0.9 % 100 mL IVPB  250 mg Intravenous Q12H Zachary Ly, MD      . lamiVUDine (EPIVIR) 10 MG/ML solution 100 mg  100 mg Oral Daily Zachary Barban, MD      . lidocaine-prilocaine (EMLA) cream   Topical PRN Zachary Sias, MD      . ondansetron [facility]) tablet 4 mg  4 mg Oral Q6H PRN Zachary Bonus, MD       Or  . ondansetron [facility]) injection 4 mg  4 mg Intravenous Q6H PRN Zachary Bonus, MD   4 mg at 05/16/12 2334  . pantoprazole (PROTONIX) EC tablet 40 mg  40 mg Oral Q1200 Zachary Gula, MD   40 mg at 05/20/12 1133  . promethazine (PHENERGAN) injection 12.5 mg  12.5 mg Intravenous Q6H PRN Zachary Deforest, MD   12.5 mg at 05/20/12 0145  . raltegravir (ISENTRESS) tablet 400 mg  400 mg Oral BID Zachary Hiss, MD   400 mg at 05/20/12 1000  . ritonavir (NORVIR) tablet 100 mg  100 mg Oral Daily Zachary Hiss, MD   100 mg at 05/20/12 1000  . senna (SENOKOT) tablet 8.6 mg  1 tablet Oral BID Zachary Bonus, MD   8.6 mg at 05/12/12 1020  . white petrolatum (VASELINE) gel           . DISCONTD: lamiVUDine (EPIVIR) 10 MG/ML solution 50 mg  50 mg Oral Daily Zachary Hiss, MD   50 mg at 05/20/12 1000    OBJECTIVE: young [place] American male examined in bed Filed Vitals:   05/20/12 1615  BP:   Pulse:   Temp: 98 F (36.7 C)  Resp:      Body mass index is 34.94 kg/(m^2).    ECOG FS: 3  Oropharynx clear No cervical or supraclavicular adenopathy Lungs no rales or rhonchi but breathing with effort Heart regular rate and rhythm Abd distended, diminished bowel sounds, diffusely tender MSK left posterior superior iliac spine is palpable Neuro: nonfocal; he is A& O x3  LAB RESULTS: Lab Results  Component Value Date   WBC 18.0* 05/20/2012   NEUTROABS 14.6* 05/20/2012   HGB 8.2* 05/20/2012   HCT 23.7* 05/20/2012   MCV 80.6 05/20/2012   PLT 226 05/20/2012  Results for Zachary Hill, Zachary Hill (MRN [id number]) as  of 05/20/2012 17:20  Ref. Range 05/11/2012 11:59 05/12/2012 05:23 05/13/2012 10:00 05/14/2012 05:30 05/15/2012 04:25 05/16/2012 05:00 05/17/2012 05:06 05/18/2012 10:30 05/19/2012 05:05 05/19/2012 19:30 05/20/2012 05:00 05/20/2012 14:06  Hemoglobin Latest Range: 13.0-17.0 g/dL 56.2 13.0 (L) 86.5 (L) 10.8 (L) 9.6 (L) 8.8 (L) 7.7 (L) 7.3 (L) 6.7 (LL) 7.5 (L) 7.0 (L)  8.2 (L)   Folate 5.5, B-12 420, ferritin 4098-1191, DAT positive for complement but IgG antibody negative, LDH 546 but t-bil 1.1 and haptoglobin 346; reticulocytes 29.4  @LASTCHEMISTRY @  No results found for this basename: [id number]    No components found with this basename: [id number]     Lab 05/14/12 0954  INR 1.38  C. Diff: Neg  DAT: Positive for complement  LDH: 546  Haptoglobin: 346  Crypto CSF: Neg  Crypto Blood: Neg  CMV CSF: Pending  Giardia/Cryptosporidium: Neg  Colonoscopy HSV/CMV: Pending  AM Cortisol 26.5  HLA B*5701: Pending  Fibrinogen: 705  EGD HSV and CMV: Neg  Microsporidia: Neg  O&P: Neg  CMV IgG: Positive  CMV DNA: Detected  CMV DNA quantity: <200   Urinalysis    Component Value Date/Time   COLORURINE ORANGE* 05/13/2012 2249   APPEARANCEUR TURBID* 05/13/2012 2249   LABSPEC 1.027 05/13/2012 2249   PHURINE 5.0 05/13/2012 2249   GLUCOSEU NEGATIVE 05/13/2012 2249   HGBUR LARGE* 05/13/2012 2249   BILIRUBINUR SMALL* 05/13/2012 2249   KETONESUR 15* 05/13/2012 2249   PROTEINUR 100* 05/13/2012 2249   UROBILINOGEN 0.2 05/13/2012 2249   NITRITE POSITIVE* 05/13/2012 2249   LEUKOCYTESUR MODERATE* 05/13/2012 2249    STUDIES: Dg Chest 2 View  05/17/2012  *RADIOLOGY REPORT*  Clinical Data: Fever, cough.  CHEST - 2 VIEW  Comparison: May 13, 2012.  Findings: Minimal bilateral pleural effusions are noted. Cardiomediastinal silhouette appears normal.  No pneumonia or atelectasis is noted.  IMPRESSION: Minimal bilateral pleural effusions.  Original Report Authenticated By: Venita Sheffield., M.D.   Dg Chest 2 View  05/12/2012   *RADIOLOGY REPORT*  Clinical Data: Fever  CHEST - 2 VIEW  Comparison: 05/11/2012  Findings: Normal cardiac silhouette and mediastinal contours.  Lung volumes are slightly reduced with minimal increase in bilateral infrahilar heterogeneous opacities.  No focal airspace opacity.  No definite pleural effusion or pneumothorax.  Unchanged bones.  IMPRESSION: Reduced lung volumes with worsening perihilar opacities favored to represent atelectasis.  No focal airspace opacities to suggest pneumonia.  Original Report Authenticated By: Waynard Reeds, M.D.   Dg Chest 2 View  05/11/2012  *RADIOLOGY REPORT*  Clinical Data: Fever.  Nausea vomiting.  CHEST - 2 VIEW  Comparison: None.  Findings: Cardiomediastinal silhouette unremarkable.  Lungs clear. Bronchovascular markings normal.  Pulmonary vascularity normal.  No pleural effusions.  No pneumothorax.  Visualized bony thorax intact.  IMPRESSION: Normal examination.  Original Report Authenticated By: Arnell Sieving, M.D.   Ct Head Wo Contrast  05/12/2012  *RADIOLOGY REPORT*  Clinical Data: Headache.  Diarrhea.  Fever.  HIV.  CT HEAD WITHOUT CONTRAST  Technique:  Contiguous axial images were obtained from the base of the skull through the vertex without contrast.  Comparison: None.  Findings: The brain stem, cerebellum, cerebral peduncles, thalami, basal ganglia, basilar cisterns, and ventricular system appear unremarkable.  No intracranial hemorrhage, mass lesion, or acute infarction is identified.  Chronic bilateral maxillary and ethmoid sinusitis noted.  IMPRESSION:  1.  Chronic bilateral maxillary ethmoid sinusitis.   Otherwise, no significant abnormality identified.  Original Report Authenticated By: Dellia Cloud, M.D.   Mr Brain Wo Contrast  05/17/2012  *RADIOLOGY REPORT*  Clinical Data: 31 year old male with headache, fever, HIV. Renal insufficiency precludes contrast.  MRI HEAD WITHOUT CONTRAST  Technique:  Multiplanar, multiecho pulse sequences of  the brain and surrounding structures were obtained according to standard protocol without intravenous contrast.  Comparison: Head CT without contrast 05/12/2012.  Findings: Susceptibility artifact on diffusion, T1, and T2* imaging suggesting parenteral iron. Subsequently, the T1 images on this exam a.  No contrast was administered (but no contrast was given).  The appearance is also felt to explain occasional areas of heterogeneous increased diffusion signal.  Normal cerebral volume.  No ventriculomegaly. No midline shift, mass effect, or evidence of mass lesion.  Wallace Cullens and white matter signal is within normal limits throughout the brain.  No areas suspicious for infarct. Major intracranial vascular flow voids are preserved.  Grossly negative visualized cervical spine. No acute intracranial hemorrhage identified.  Scattered paranasal sinus mucosal thickening.  Mild to moderate fluid signal in the mastoid air cells.  A small volume of retained secretions in the nasopharynx.  Visualized orbit soft tissues are within normal limits.  Visualized bone marrow signal is within normal limits. Negative scalp soft tissues.  IMPRESSION: 1.  Artifact on multiple sequences suggesting recent parenteral iron or iron overload (reportedly this patient may have recently had multiple transfusions). 2.  Otherwise negative noncontrast MRI appearance of the brain. Renal insufficiency precludes contrast. 3.  Mild to moderate paranasal sinus and mastoid inflammatory changes, clinical significance is unclear.  The mastoid findings likely are sterile effusions.  Original Report Authenticated By: Harley Hallmark, M.D.   US Abdomen Complete  05/11/2012  *RADIOLOGY REPORT*  Clinical Data:  Nausea/vomiting, abdominal pain  COMPLETE ABDOMINAL ULTRASOUND  Comparison:  None.  Findings:  Gallbladder:  Contracted gallbladder, likely related to nonfasting state.  No gallstones or pericholecystic fluid.  Common bile duct:  Measures 5 mm.  Liver:  No  focal lesion identified.  Within normal limits in parenchymal echogenicity.  IVC:  Appears normal.  Pancreas:  Poorly visualized but grossly unremarkable.  Spleen:  Measures 6.4 cm.  Right Kidney:  Measures 1.4 cm.  No mass or hydronephrosis.  Left Kidney:  Measures 11.3 cm.  No mass or hydronephrosis.  Abdominal aorta:  Poorly visualized.  No aneurysm identified.  IMPRESSION: Contracted gallbladder.  No findings to suggest acute cholecystitis.  Otherwise negative abdominal ultrasound.  Original Report Authenticated By: Charline Bills, M.D.   Ct Abdomen Pelvis W Contrast  05/12/2012  *RADIOLOGY REPORT*  Clinical Data: Fever.  Diarrhea.  Headache.  HIV.  Hepatitis B.  CT ABDOMEN AND PELVIS WITH CONTRAST  Technique:  Multidetector CT imaging of the abdomen and pelvis was performed following the standard protocol during bolus administration of intravenous contrast.  Contrast: OMNIPAQUE IOHEXOL 300 MG/ML  SOLN  Comparison: 05/11/2012  Findings: Linear subsegmental atelectasis present in both lower lobes.  Contracted gallbladder noted.  The liver, spleen, adrenal glands, and pancreas appear unremarkable.  The kidneys appear unremarkable, as do the proximal ureters.  Small porta hepatis, peripancreatic, and retroperitoneal lymph nodes are not pathologically enlarged by size criteria.  Air-fluid levels are present throughout the distal colon, characteristic for diarrheal process.  Fluid filled loops of small bowel are not dilated.  Scattered small mesenteric lymph nodes are not pathologically enlarged but are increased in number and may be reactive.  No significant colon wall thickening is observed.  No pelvic ascites.  The appendix appears normal.  IMPRESSION:  1.  Air fluid levels in nondilated small bowel and throughout the colon including the distal colon, suggesting diarrheal process and possible enteritis.  No significant abnormal bowel wall thickening is observed.  Original Report Authenticated By: Dellia Cloud, M.D.   US Renal  05/15/2012  *RADIOLOGY REPORT*  Clinical Data:   HIV positive.  Oliguria.  RENAL / URINARY TRACT ULTRASOUND  Technique:  Complete ultrasound exam of the kidneys and urinary bladder was performed.  Comparison: CT scan from 05/12/2012  Findings:  The right kidney measures 11.8 cm in long axis.  The left kidney measures 12.1 cm.  No evidence for hydronephrosis.  No renal mass. Both kidneys show increased renal cortical echogenicity.  Midline imaging of the pelvis reveals a normal appearing urinary bladder.  Impression:  Bilateral echogenic kidneys, consistent with medical renal disease. No evidence for hydronephrosis.  Original Report Authenticated By: ERIC A. MANSELL, M.D.   Dg Chest Port 1 View  05/20/2012  *RADIOLOGY REPORT*  Clinical Data: Increased shortness of breath, respiratory distress  PORTABLE CHEST - 1 VIEW  Comparison: Portable exam 1534 hours compared to 05/17/2012  Findings: Upper normal heart size. Slight pulmonary vascular congestion. Significant airspace consolidation right lower lobe, suspect left lower lobe as well. No gross pleural effusion or pneumothorax. Bones unremarkable.  IMPRESSION: Probable bilateral lower lobe infiltrates progressive since previous study.   Original Report Authenticated By: Lollie Marrow, M.D.    Dg Chest Port 1 View  05/13/2012  *RADIOLOGY REPORT*  Clinical Data: HIV.  Tachypnea.  Febrile.  PORTABLE CHEST - 1 VIEW  Comparison: 05/12/2012.  Findings: No interval change compared to prior.  Mild perihilar opacity is present, likely representing atelectasis.  No pneumothorax.  No focal airspace disease is identified. Monitoring leads are projected over the chest.  Cardiopericardial silhouette within normal limits.  Stable prominence of the right hilum.  IMPRESSION: No interval change.  Low volume chest.  Original Report Authenticated By: Andreas Newport, M.D.   Dg Fluoro Guide Lumbar Puncture  05/17/2012  *RADIOLOGY REPORT*  Clinical  Data: HIV. Fever.  Nausea and vomiting.  LUMBAR PUNCTURE FLUORO GUIDE  Comparison: None.  Findings: After explaining the purpose, procedure, risks, including bleeding, headache, infection, and medication reaction, and after obtaining written informed consent, using sterile technique and local anesthesia I inserted a 20 gauge spinal needle into the thecal sac at the L4-5 level using direct fluoroscopic visualization.  Clear colorless spinal fluid was obtained for laboratory examination.  Opening pressure was 25 cm of water.  Closing pressure was 14 cm of water.  A total of 28.5 ml of cerebrospinal fluid was sent to the lab for testing.  Fluoroscopy time:  22 seconds.  IMPRESSION: The lumbar puncture performed without complication.  Original Report Authenticated By: Gwynn Burly, M.D.    ASSESSMENT: 31 y.o. known to be HIV positive, with a CD4 % of 2 (June 2013), admitted with fever, nausea and vomiting, and anemia and thrombocytopenia, other hematology parameters as above. -- Review of the blood film shows toxic granulation in the white cells but no significant left shift, platetelets are adequate and not clumped; red cells show rouleaux, rare polychromasia, but no schistocytes, rare spherocytes.  1) do not believe we are dealing with hemolysis: a DAT positive for complement only is nonspecific, and the t-bil and haptoglobin are normal; the LDH elevation is nonspecific as well and the reticulocyte count is low, not elevated  2) patient certainly has a hypo-proliferative anemia, partly doubtless due to his ARF, partly perhaps to marrow involvement by infection, partly perhaps from medications; from a hematology point of view a bone marrow biopsy would not be helpful but if infectious disease feels they need a bone marrow culture this would likely have to be done at bedside, as I do not believe in his current situation the patient could be done  by IR or tolerate sedation  3) continue PRBC trannsfusions as  needed; will give single dose of aranesp in hopes of improving the reticulocyte count short terms  4) I do not think there is gross ongoing bleeding and stool has been repeatedly guaiac negative, but I am concerned by this patient's abdominal exam and I would concentrated diagnostic and therapeutic efforts there  Will follow with you.  PLAN:   Lowella Dell    05/20/2012

## 2012-05-20 NOTE — Progress Notes (Signed)
  Echocardiogram 2D Echocardiogram has been performed.  Jurgen Groeneveld 05/20/2012, 4:20 PM

## 2012-05-20 NOTE — Discharge Summary (Signed)
Internal Medicine Teaching [facility] Discharge Note  Name: Zachary Hill MRN: [id number] DOB: [birth year] 31 y.o.  Date of Admission: 05/11/2012  1:20 PM Date of Discharge: 05/20/2012 Attending Physician: Margarito Liner, MD  Discharge Diagnosis: Principal Problem:  *Fever Active Problems:  Human immunodeficiency virus (HIV) disease  Nausea & vomiting  Odynophagia  Diarrhea  Pharyngitis  Acute kidney injury  Thrombocytopenia  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  Dyspnea   Discharge Medications: Patient left AMA before a discharge medication plan could be made.  Disposition and follow-up:   Zachary Hill left AMA on 05/20/12, despite a long conversation with the patient and his HCPOA explaining the risks of leaving the hospital.  Follow-up Appointments: Discharge Orders    Future Appointments: Provider: Department: Dept Phone: Center:   06/10/2012 2:45 PM Judyann Munson, MD [facility] [phone] [facility]      Consultations:  Gastroenterology: Dr. Bosie Clos Infectious Disease: Dr. Daiva Eves, Dr. Drue Second, Dr. Ninetta Lights Oncology/Hematology: Dr. Darnelle Catalan Nephrology: Dr. Lowell Guitar, Dr. Caryn Section Pathology: Dr. Raynald Blend, Dr. Colonel Bald Interventional Radiology: Dr. Jena Gauss Critical Care: Dr. Delton Coombes  Procedures Performed:  Dg Chest 2 View  05/17/2012  *RADIOLOGY REPORT*  Clinical Data: Fever, cough.  CHEST - 2 VIEW  Comparison: May 13, 2012.  Findings: Minimal bilateral pleural effusions are noted. Cardiomediastinal silhouette appears normal.  No pneumonia or atelectasis is noted.  IMPRESSION: Minimal bilateral pleural effusions.  Original Report Authenticated By: Venita Sheffield., M.D.   Dg Chest 2 View  05/12/2012  *RADIOLOGY REPORT*  Clinical Data: Fever  CHEST - 2 VIEW  Comparison: 05/11/2012  Findings: Normal cardiac silhouette and mediastinal contours.  Lung volumes are slightly reduced with minimal increase in bilateral infrahilar heterogeneous opacities.  No focal airspace  opacity.  No definite pleural effusion or pneumothorax.  Unchanged bones.  IMPRESSION: Reduced lung volumes with worsening perihilar opacities favored to represent atelectasis.  No focal airspace opacities to suggest pneumonia.  Original Report Authenticated By: Waynard Reeds, M.D.   Dg Chest 2 View  05/11/2012  *RADIOLOGY REPORT*  Clinical Data: Fever.  Nausea vomiting.  CHEST - 2 VIEW  Comparison: None.  Findings: Cardiomediastinal silhouette unremarkable.  Lungs clear. Bronchovascular markings normal.  Pulmonary vascularity normal.  No pleural effusions.  No pneumothorax.  Visualized bony thorax intact.  IMPRESSION: Normal examination.  Original Report Authenticated By: Arnell Sieving, M.D.   Ct Head Wo Contrast  05/12/2012  *RADIOLOGY REPORT*  Clinical Data: Headache.  Diarrhea.  Fever.  HIV.  CT HEAD WITHOUT CONTRAST  Technique:  Contiguous axial images were obtained from the base of the skull through the vertex without contrast.  Comparison: None.  Findings: The brain stem, cerebellum, cerebral peduncles, thalami, basal ganglia, basilar cisterns, and ventricular system appear unremarkable.  No intracranial hemorrhage, mass lesion, or acute infarction is identified.  Chronic bilateral maxillary and ethmoid sinusitis noted.  IMPRESSION:  1.  Chronic bilateral maxillary ethmoid sinusitis.   Otherwise, no significant abnormality identified.  Original Report Authenticated By: Dellia Cloud, M.D.   Mr Brain Wo Contrast  05/17/2012  *RADIOLOGY REPORT*  Clinical Data: 31 year old male with headache, fever, HIV. Renal insufficiency precludes contrast.  MRI HEAD WITHOUT CONTRAST  Technique:  Multiplanar, multiecho pulse sequences of the brain and surrounding structures were obtained according to standard protocol without intravenous contrast.  Comparison: Head CT without contrast 05/12/2012.  Findings: Susceptibility artifact on diffusion, T1, and T2* imaging suggesting parenteral iron.  Subsequently, the T1 images on this exam a.  No  contrast was administered (but no contrast was given).  The appearance is also felt to explain occasional areas of heterogeneous increased diffusion signal.  Normal cerebral volume.  No ventriculomegaly. No midline shift, mass effect, or evidence of mass lesion.  Wallace Cullens and white matter signal is within normal limits throughout the brain.  No areas suspicious for infarct. Major intracranial vascular flow voids are preserved.  Grossly negative visualized cervical spine. No acute intracranial hemorrhage identified.  Scattered paranasal sinus mucosal thickening.  Mild to moderate fluid signal in the mastoid air cells.  A small volume of retained secretions in the nasopharynx.  Visualized orbit soft tissues are within normal limits.  Visualized bone marrow signal is within normal limits. Negative scalp soft tissues.  IMPRESSION: 1.  Artifact on multiple sequences suggesting recent parenteral iron or iron overload (reportedly this patient may have recently had multiple transfusions). 2.  Otherwise negative noncontrast MRI appearance of the brain. Renal insufficiency precludes contrast. 3.  Mild to moderate paranasal sinus and mastoid inflammatory changes, clinical significance is unclear.  The mastoid findings likely are sterile effusions.  Original Report Authenticated By: Harley Hallmark, M.D.   US Abdomen Complete  05/11/2012  *RADIOLOGY REPORT*  Clinical Data:  Nausea/vomiting, abdominal pain  COMPLETE ABDOMINAL ULTRASOUND  Comparison:  None.  Findings:  Gallbladder:  Contracted gallbladder, likely related to nonfasting state.  No gallstones or pericholecystic fluid.  Common bile duct:  Measures 5 mm.  Liver:  No focal lesion identified.  Within normal limits in parenchymal echogenicity.  IVC:  Appears normal.  Pancreas:  Poorly visualized but grossly unremarkable.  Spleen:  Measures 6.4 cm.  Right Kidney:  Measures 1.4 cm.  No mass or hydronephrosis.  Left Kidney:   Measures 11.3 cm.  No mass or hydronephrosis.  Abdominal aorta:  Poorly visualized.  No aneurysm identified.  IMPRESSION: Contracted gallbladder.  No findings to suggest acute cholecystitis.  Otherwise negative abdominal ultrasound.  Original Report Authenticated By: Charline Bills, M.D.   Ct Abdomen Pelvis W Contrast  05/12/2012  *RADIOLOGY REPORT*  Clinical Data: Fever.  Diarrhea.  Headache.  HIV.  Hepatitis B.  CT ABDOMEN AND PELVIS WITH CONTRAST  Technique:  Multidetector CT imaging of the abdomen and pelvis was performed following the standard protocol during bolus administration of intravenous contrast.  Contrast: OMNIPAQUE IOHEXOL 300 MG/ML  SOLN  Comparison: 05/11/2012  Findings: Linear subsegmental atelectasis present in both lower lobes.  Contracted gallbladder noted.  The liver, spleen, adrenal glands, and pancreas appear unremarkable.  The kidneys appear unremarkable, as do the proximal ureters.  Small porta hepatis, peripancreatic, and retroperitoneal lymph nodes are not pathologically enlarged by size criteria.  Air-fluid levels are present throughout the distal colon, characteristic for diarrheal process.  Fluid filled loops of small bowel are not dilated.  Scattered small mesenteric lymph nodes are not pathologically enlarged but are increased in number and may be reactive.  No significant colon wall thickening is observed.  No pelvic ascites.  The appendix appears normal.  IMPRESSION:  1.  Air fluid levels in nondilated small bowel and throughout the colon including the distal colon, suggesting diarrheal process and possible enteritis.  No significant abnormal bowel wall thickening is observed.  Original Report Authenticated By: Dellia Cloud, M.D.   US Renal  05/15/2012  *RADIOLOGY REPORT*  Clinical Data:   HIV positive.  Oliguria.  RENAL / URINARY TRACT ULTRASOUND  Technique:  Complete ultrasound exam of the kidneys and urinary bladder was performed.  Comparison: CT  scan from  05/12/2012  Findings:  The right kidney measures 11.8 cm in long axis.  The left kidney measures 12.1 cm.  No evidence for hydronephrosis.  No renal mass. Both kidneys show increased renal cortical echogenicity.  Midline imaging of the pelvis reveals a normal appearing urinary bladder.  Impression:  Bilateral echogenic kidneys, consistent with medical renal disease. No evidence for hydronephrosis.  Original Report Authenticated By: ERIC A. MANSELL, M.D.   Dg Chest Port 1 View  05/20/2012  *RADIOLOGY REPORT*  Clinical Data: Increased shortness of breath, respiratory distress  PORTABLE CHEST - 1 VIEW  Comparison: Portable exam 1534 hours compared to 05/17/2012  Findings: Upper normal heart size. Slight pulmonary vascular congestion. Significant airspace consolidation right lower lobe, suspect left lower lobe as well. No gross pleural effusion or pneumothorax. Bones unremarkable.  IMPRESSION: Probable bilateral lower lobe infiltrates progressive since previous study.   Original Report Authenticated By: Lollie Marrow, M.D.    Dg Chest Port 1 View  05/13/2012  *RADIOLOGY REPORT*  Clinical Data: HIV.  Tachypnea.  Febrile.  PORTABLE CHEST - 1 VIEW  Comparison: 05/12/2012.  Findings: No interval change compared to prior.  Mild perihilar opacity is present, likely representing atelectasis.  No pneumothorax.  No focal airspace disease is identified. Monitoring leads are projected over the chest.  Cardiopericardial silhouette within normal limits.  Stable prominence of the right hilum.  IMPRESSION: No interval change.  Low volume chest.  Original Report Authenticated By: Andreas Newport, M.D.   Dg Fluoro Guide Lumbar Puncture  05/17/2012  *RADIOLOGY REPORT*  Clinical Data: HIV. Fever.  Nausea and vomiting.  LUMBAR PUNCTURE FLUORO GUIDE  Comparison: None.  Findings: After explaining the purpose, procedure, risks, including bleeding, headache, infection, and medication reaction, and after obtaining written informed  consent, using sterile technique and local anesthesia I inserted a 20 gauge spinal needle into the thecal sac at the L4-5 level using direct fluoroscopic visualization.  Clear colorless spinal fluid was obtained for laboratory examination.  Opening pressure was 25 cm of water.  Closing pressure was 14 cm of water.  A total of 28.5 ml of cerebrospinal fluid was sent to the lab for testing.  Fluoroscopy time:  22 seconds.  IMPRESSION: The lumbar puncture performed without complication.  Original Report Authenticated By: Gwynn Burly, M.D.    2D Echo Results 05/20/12 - Left ventricle: The cavity size was normal. Systolic function was normal. The estimated ejection fraction was in the range of 55% to 60%. Wall motion was normal; there were no regional wall motion abnormalities. - Pericardium, extracardiac: A small, free-flowing pericardial effusion was identified posterior to the heart. The fluid had no internal echoes. Features were not consistent with tamponade physiology.  Admission HPI:  31yo M with h/o Hep B, HPV, and HIV and has not been taking his medications for close to a year and had a CD4 count of 10 on 7/29, who presented to the ED with nausea, vomiting, SOB, and fevers for the past week, admitted with a fever of 103 of unknown origin. He is seen by Dr. Drue Second, and was previously on Truvada/dar/rit with a CD 4 count above 200. He stopped taking his medications because he felt that he was doing well and that he was "on cloud nine" and forgot to take his meds, stating "out of site, out of mind". He presented to Dr. Drue Second in clinic on 7/29 c/o cold-like sx and painful swallowing concerning for esophageal candidiasis. She prescribed him fluconazole x 14days,  Bactrim, and Azithromycin. He is to restart his HIV medications once his ADAP is approved. He developed nausea, vomiting, fevers, chills and SOB with a rattling cough, and called Dr. Drue Second who changed the Bactrim to Dapsone. He continued  taking the medications despite continued symptoms, until deciding to go to the ED 8/10 for further evaluation and treatment. He denies sick contacts, but states that his "living conditions are deplorable." Despite his nausea and vomiting, as of today his is able to tolerate water and pretzels. He does endorse mid epigastric and RUQ abdominal pain, constipation x 3-4 days, dark urine, pruritis of his hands and feet, and itching of his groin.  Admission Physical Exam Blood pressure 135/74, pulse 111, temperature 102.8 F (39.3 C), temperature source Oral, resp. rate 20, height 5\' 6"  (1.676 m), weight 192 lb 8 oz (87.317 kg), SpO2 98.00%.  Constitutional: Vital signs reviewed. Patient is a well-developed and well-nourished male in no acute distress and cooperative with exam. Alert and oriented x3.  Head: Normocephalic and atraumatic  Mouth: No erythema or exudates, MMM  Eyes: PERRL, EOMI, conjunctivae injected bilaterally, No scleral icterus.  Neck: Supple, Trachea midline normal ROM, No JVD, mass, thyromegaly, or carotid bruit present.  Cardiovascular: Mild tachycardia, regular rhythm, S1 normal, S2 normal, no MRG, pulses symmetric and intact bilaterally  Pulmonary/Chest: Diminished breath sounds diffusely, no appreciable wheezes, rales, or rhonchi  Abdominal: Soft. TTP in mid epigastrium and RUQ inferior to rib cage, non-distended, bowel sounds are normal, no masses, organomegaly, or guarding present.  GU: Mild CVA tenderness. No rash or ulcerations seen on penis or scrotum. Musculoskeletal: No joint deformities, erythema, or stiffness, ROM full and no nontender Neurological: A&O x3, Strength is normal and symmetric bilaterally, cranial nerve II-XII are grossly intact, no focal motor deficit, sensory intact to light touch bilaterally.  Skin: Warm, dry and intact. No rash, cyanosis, or clubbing.  Psychiatric: Normal mood and affect. Speech and behavior is normal. Judgment and thought content normal.  Cognition and memory are normal.  Admission Labs Basic Metabolic Panel:   [facility]  05/11/12 1159   NA  134*   K  3.6   CL  100   CO2  20   GLUCOSE  104*   BUN  12   CREATININE  0.91   CALCIUM  9.5   MG  --   PHOS  --    Liver Function Tests:   Basename  05/11/12 1159   AST  85*   ALT  138*   ALKPHOS  293*   BILITOT  1.3*   PROT  8.4*   ALBUMIN  3.8    CBC:   Basename  05/11/12 1159   WBC  9.8   NEUTROABS  8.2*   HGB  13.2   HCT  38.6*   MCV  79.4   PLT  98*    Urinalysis:   Basename  05/11/12 1218   COLORURINE  ORANGE*   LABSPEC  1.029   PHURINE  6.0   GLUCOSEU  NEGATIVE   HGBUR  NEGATIVE   BILIRUBINUR  SMALL*   KETONESUR  15*   PROTEINUR  100*   UROBILINOGEN  4.0*   NITRITE  NEGATIVE   LEUKOCYTESUR  [facility] by problem list: 1. Fever/Diarrhea - the patient was admitted with fever, and continued to have daily fevers throughout hospitalization (Tmax = 103.1 on 8/11), along with tachypnea, tachycardia, nausea, vomiting, and diarrhea.  Given his CD4 count of 10,  the differential was initially wide, and lab work-up included urine culture (05/11/12, no growth final), blood culture x3 (05/11/12: no growth final. 05/18/12: no growth to date), blood AFB culture (05/12/12, specimen to be held for 6 weeks), stool culture (05/12/12, no pathologic organism identified, but reduced normal flora present), stool ova and parasites (05/12/12, no ova or parasites seen), fungal culture from esophageal biopsy (05/13/12, negative) and colon biopsy (05/15/12, negative), viral culture from esophageal biopsy (05/13/12, no HSV or CMV identified) and colon biopsy (05/15/12, no HSV or CMV identified), c diff PCR (05/14/12 and 05/17/12, negative), CSF gram stain/culture (05/16/12, no growth final, WBC's seen on gram stain), toxoplasma antibody (negative), CMV Ab IgG (positive, >6.5), Sputum PCP smear by DFA (05/20/12: negative). Immunohistochemistry stains for colonoscopy tissue pending  per Pathology, results expected in next 1-2 days.    The patient was initially started on Vanc (8/10-8/12) and Zosyn (8/10-8/11) out of concern for sepsis (fever, tachycardia, probable leukocytosis in the setting of suspected baseline neutropenia) likely from opportunistic infection.  He was transitioned to ceftriaxone (8/11-8/12) and flagyl (8/11-8/13), but antibiotics were discontinued due to preliminary results as detailed above which showed no clear source of infection.  He was empirically treated for MAC with ethambutol 15mg /kg starting on 05/12/12 (First dose on 05/13/12 at 0029). The patient was treated with diflucan (8/10-8/19) for possible candidal esophagitis, given symptoms of horseness and dysphagia prior to admission, though esophageal fungal cultures later in his hospitalization were negative (after receiving diflucan).  The patient was also treated with azithromycin (8/12-8/19) and Dapsone (8/10-8/15), transitioned to atovaquone (8/17-8/19), for PCP prophylaxis.  On 05/20/12, the patient had worsening tachypnea and SOB, and CXR showed pulmonary edema vs possible infiltrate.  The patient was started on Primaxin (8/19), and received 1 dose prior to leaving AMA.  2. Acute Kidney Injury - the patient's creatinine on admission was 0.91.  The patient underwent CT abdomen/pelvis (8/11) due to GI symptoms, and received 100 cc IV iohexol, with resultant acute kidney injury noted 8/12 due to ATN.  Vancomycin was discontinued on 8/12, but a vancomycin trough on 8/13 showed a supratherapeutic level of 41 ug/mL (ref range 10-20), likely also causing some degree of ATN.  The patient's creatinine peaked at 6.41 (8/15), and remained elevated at 6.07 on 8/19, causing concern about the patient's ability to recover his kidney function.  3. HIV - the patient presented with a CD4 count of 10, after 1 year of non-compliance with HIV medications.  An HIV genotype was drawn at the patient's ID clinic prior to admission.   HAART was initially held, pending genotype results, to avoid worsening of resistance patterns, but the patient was started on prezista, epivir, isentress, and norvir on 05/17/12.  4. Acute on Chronic Anemia - the patient presented with an initial Hb of 13.2, which downtrended daily.  History and physical revealed no obvious source of bleeding (no BRBPR, melena, hematuria, history of trauma).  Peripheral blood smear showed target cells and increased bands.  Workup was notable for an elevated LDH of 546 on 8/16, but high haptoglobin = 346 (8/16).  GI was consulted, and EGD and colonoscopy showed no evidence of GI bleed.  Hematology consult was obtained, who believed the patient's anemia was hypo-proliferative in etiology, rather than hemolytic, possibly secondary to medications vs bone marrow involvement.  The patient has received 2 units of pRBC's (1 unit 8/18, 1 unit 8/19), and hemoglobin at discharge was 8.2 (8/19, 2:06 pm).  5. Thrombocytopenia - the patient developed  thrombocytopenia during his hospitalization, with a fall in platelets from 98 (8/10) to a nadir of 30 (8/14), but subsequently improved to 226 on the day of discharge (8/19).  The etiology of this was likely secondary to HIV with bone marrow suppression vs sepsis.  DIC panel showed elevated D-dimer but elevated fibrinogen.  HIT score was 3/8 (low probability).  The patient received 1 unit of platelets on 8/14 in preparation for colonoscopy.  Time spent on discharge: 60 minutes  Discharge Vitals:  BP 140/95  Pulse 118  Temp 99.3 F (37.4 C) (Axillary)  Resp 36  Ht 5\' 6"  (1.676 m)  Wt 216 lb 7.9 oz (98.2 kg)  BMI 34.94 kg/m2  SpO2 100%  Discharge Labs:  Results for orders placed during the hospital encounter of 05/11/12 (from the past 24 hour(s))  RENAL FUNCTION PANEL     Status: Abnormal   Collection Time   05/20/12  5:00 AM      Component Value Range   Sodium 138  135 - 145 mEq/L   Potassium 3.5  3.5 - 5.1 mEq/L   Chloride  102  96 - 112 mEq/L   CO2 22  19 - 32 mEq/L   Glucose, Bld 88  70 - 99 mg/dL   BUN 57 (*) 6 - 23 mg/dL   Creatinine, Ser 1.61 (*) 0.50 - 1.35 mg/dL   Calcium 8.0 (*) 8.4 - 10.5 mg/dL   Phosphorus 4.5  2.3 - 4.6 mg/dL   Albumin 2.0 (*) 3.5 - 5.2 g/dL   GFR calc non Af Amer 11 (*) >90 mL/min   GFR calc Af Amer 13 (*) >90 mL/min  CBC WITH DIFFERENTIAL     Status: Abnormal   Collection Time   05/20/12  5:00 AM      Component Value Range   WBC 16.0 (*) 4.0 - 10.5 K/uL   RBC 2.58 (*) 4.22 - 5.81 MIL/uL   Hemoglobin 7.0 (*) 13.0 - 17.0 g/dL   HCT 09.6 (*) 04.5 - 40.9 %   MCV 77.9 (*) 78.0 - 100.0 fL   MCH 27.1  26.0 - 34.0 pg   MCHC 34.8  30.0 - 36.0 g/dL   RDW 81.1  91.4 - 78.2 %   Platelets 212  150 - 400 K/uL   Neutrophils Relative 91 (*) 43 - 77 %   Lymphocytes Relative 3 (*) 12 - 46 %   Monocytes Relative 2 (*) 3 - 12 %   Eosinophils Relative 4  0 - 5 %   Basophils Relative 0  0 - 1 %   Neutro Abs 14.6 (*) 1.7 - 7.7 K/uL   Lymphs Abs 0.5 (*) 0.7 - 4.0 K/uL   Monocytes Absolute 0.3  0.1 - 1.0 K/uL   Eosinophils Absolute 0.6  0.0 - 0.7 K/uL   Basophils Absolute 0.0  0.0 - 0.1 K/uL   RBC Morphology POLYCHROMASIA PRESENT     WBC Morphology TOXIC GRANULATION    MAGNESIUM     Status: Normal   Collection Time   05/20/12  5:00 AM      Component Value Range   Magnesium 1.9  1.5 - 2.5 mg/dL  HEPATIC FUNCTION PANEL     Status: Abnormal   Collection Time   05/20/12  5:00 AM      Component Value Range   Total Protein 6.2  6.0 - 8.3 g/dL   Albumin 2.1 (*) 3.5 - 5.2 g/dL   AST 59 (*) 0 - 37 U/L  ALT 26  0 - 53 U/L   Alkaline Phosphatase 87  39 - 117 U/L   Total Bilirubin 1.1  0.3 - 1.2 mg/dL   Bilirubin, Direct 0.6 (*) 0.0 - 0.3 mg/dL   Indirect Bilirubin 0.5  0.3 - 0.9 mg/dL  PNEUMOCYSTIS JIROVECI SMEAR BY DFA     Status: Normal   Collection Time   05/20/12  9:30 AM      Component Value Range   Specimen Source-PJSRC SPUTUM     Pneumocystis jiroveci Ag NEGATIVE    PREPARE RBC  (CROSSMATCH)     Status: Normal   Collection Time   05/20/12 10:10 AM      Component Value Range   Order Confirmation ORDER PROCESSED BY BLOOD BANK    CBC     Status: Abnormal   Collection Time   05/20/12  2:06 PM      Component Value Range   WBC 18.0 (*) 4.0 - 10.5 K/uL   RBC 2.94 (*) 4.22 - 5.81 MIL/uL   Hemoglobin 8.2 (*) 13.0 - 17.0 g/dL   HCT 96.0 (*) 45.4 - 09.8 %   MCV 80.6  78.0 - 100.0 fL   MCH 27.9  26.0 - 34.0 pg   MCHC 34.6  30.0 - 36.0 g/dL   RDW 11.9  14.7 - 82.9 %   Platelets 226  150 - 400 K/uL  RETICULOCYTES     Status: Abnormal   Collection Time   05/20/12  2:06 PM      Component Value Range   Retic Ct Pct 1.0  0.4 - 3.1 %   RBC. 2.94 (*) 4.22 - 5.81 MIL/uL   Retic Count, Manual 29.4  19.0 - 186.0 K/uL  BLOOD GAS, ARTERIAL     Status: Abnormal   Collection Time   05/20/12  4:08 PM      Component Value Range   FIO2 0.21     pH, Arterial 7.427  7.350 - 7.450   pCO2 arterial 32.6 (*) 35.0 - 45.0 mmHg   pO2, Arterial 81.9  80.0 - 100.0 mmHg   Bicarbonate 21.1  20.0 - 24.0 mEq/L   TCO2 22.1  0 - 100 mmol/L   Acid-base deficit 2.6 (*) 0.0 - 2.0 mmol/L   O2 Saturation 95.6     Patient temperature 98.6     Collection site LEFT RADIAL     Drawn by [id number]     Sample type ARTERIAL     Allens test (pass/fail) PASS  PASS    Signed: Janalyn Harder 05/20/2012, 11:17 PM

## 2012-05-20 NOTE — Progress Notes (Signed)
Pt tachypnic respirations increased to 40's, on 40% venin-mask SATs 100 %.  Dr Manson Passey notified   On floor to talk with patient.  Will continue to  monitor

## 2012-05-20 NOTE — Progress Notes (Signed)
Utilization review completed.  

## 2012-05-20 NOTE — Progress Notes (Addendum)
Subjective: Nurse called to notify on call team that patient is feeling more SOB after his blood transfusion even though his sat was "ok".   Patient reports that he is "struggling" to catch air.  He denies any chest pain.    Objective: Vital signs in last 24 hours: Filed Vitals:   05/20/12 1220 05/20/12 1255 05/20/12 1315 05/20/12 1435  BP:  150/91 125/73   Pulse: 118 112 126   Temp:  101.9 F (38.8 C) 101 F (38.3 C)   TempSrc: Oral Oral Oral   Resp: 32 30 33   Height:      Weight:      SpO2:    99%   Weight change: -13 lb 0.1 oz (-5.9 kg)  Intake/Output Summary (Last 24 hours) at 05/20/12 1532 Last data filed at 05/20/12 1325  Gross per 24 hour  Intake    620 ml  Output   2090 ml  Net  -1470 ml   Physical exam: General: moderate respiratory distress but able to follow commands and answers questions Lungs: decreased breath sounds bilaterally up to mid lung fields, crackles on left base, + accessory muscle use, no wheezes. Heart: tachycardic, regular rhythm, no M/G/R Abd: soft, mild diffused tenderness to palpation, normal BS Ext: +1 pitting edema on LE bilaterally Neuro: nonfocal  Lab Results: Basic Metabolic Panel:  Lab 05/20/12 1610 05/19/12 0505  NA 138 140  K 3.5 3.0*  CL 102 100  CO2 22 26  GLUCOSE 88 95  BUN 57* 58*  CREATININE 6.07* 6.21*  CALCIUM 8.0* 7.9*  MG 1.9 2.1  PHOS 4.5 3.8   Liver Function Tests:  Lab 05/20/12 0500 05/19/12 0505 05/14/12 0530  AST 59* -- 77*  ALT 26 -- 67*  ALKPHOS 87 -- 133*  BILITOT 1.1 -- 0.9  PROT 6.2 -- 6.5  ALBUMIN 2.0*2.1* 2.1* --   CBC:  Lab 05/20/12 1406 05/20/12 0500 05/19/12 1930  WBC 18.0* 16.0* --  NEUTROABS -- 14.6* 13.3*  HGB 8.2* 7.0* --  HCT 23.7* 20.1* --  MCV 80.6 77.9* --  PLT 226 212 --   Anemia Panel:  Lab 05/20/12 1406 05/17/12 0910  VITAMINB12 -- 420  FOLATE -- 5.5  FERRITIN -- --  TIBC -- 288  IRON -- 225*  RETICCTPCT 1.0 --  Urinalysis: Medications: medication  reviewed Scheduled Meds:   . atovaquone  1,500 mg Oral Q breakfast  . azithromycin  600 mg Oral Daily  . darunavir  800 mg Oral Daily  . ethambutol  15 mg/kg Oral Daily  . fluconazole  100 mg Oral Q24H  . furosemide      . furosemide      . furosemide  20 mg Intravenous Once  . furosemide  20 mg Intravenous Once  . lamiVUDine  100 mg Oral Daily  . pantoprazole  40 mg Oral Q1200  . potassium chloride  40 mEq Oral Q4H  . raltegravir  400 mg Oral BID  . ritonavir  100 mg Oral Daily  . senna  1 tablet Oral BID  . white petrolatum      . DISCONTD: lamiVUDine  50 mg Oral Daily   Continuous Infusions:  PRN Meds:.acetaminophen, acetaminophen, albuterol, ipratropium, lidocaine-prilocaine, ondansetron (ZOFRAN) IV, ondansetron, promethazine Assessment/Plan:  42 man with HIV now AIDS, off ART for a year, CD4<10 now on dapsone, azithromycin, and diflucan pending ADAP to resume ART prior to admission. Adm for temp of 103 w/o localizing site of infection likely 2/2 MAC.  1. Acute on chronic respiratory distress/tachycardia: on arrival, patient was more tachypnic and tachycardic after receiving his 1 unit of blood transfusion which was completed 2 hours prior.  He did also receive Lasix 20mg  IV post transfusion.  He has net negative 1470cc in past 24 hours.  His acute respiratory distress could be 2/2 volume overload but other differential also include occult pulmonary infection vs heart failure? On my exam, he does have decreased breath sounds bilaterally up to mid-lung fields with crackles on left lung. -Will get stat Chest Xray, stat ABG -Continue Venti-Mask for oxygenation, may need to escalate to BiPAP if respiratory status worsen, currently he is satting in the 90-100's with VentiMask -Give IV Lasix 20mg  -Stat 2-D echocardiogram to evaluate LV function -Will consult pulmonary team if no improvement , may need to rule out PE with VQ scan? -Attending, Dr. Meredith Pel was present  Addendum: CXR  shows bilateral infiltrates which is new from 8/16 and also his WBC is now 18, I discussed with Dr. Daiva Eves in detailed and he recommends Primaxin to cover for pseudomonas, gram negative organisms.  Will also consult pulmonology for possible bronchoscopy as well as in setting of worsening respiratory status- Spoke to Dr. Sung Amabile and he reviewed CXR which appears more like infiltrates. Patient may need to go to ICU.   LOS: 9 days   Zachary Hill 05/20/2012, 3:32 PM

## 2012-05-20 NOTE — Consult Note (Signed)
Name: Zachary Hill MRN: [id number] DOB: [birth year]    LOS: 9  [facility] Note   History of Present Illness: 31 y/o M with PMH of HIV (CD4 10, VL 475 6/13), medication non-compliance-off HAART >1 yr, Hepatitis B, last seen in ID clinic on 7/29 at which time he had cold like symptoms and concern for esophageal candidiasis -Rx'd with fluconazole, bactrim & Azithro.  Developed n/v and bactrim was changed to dapsone.  Presented to ED on 8/10 with vomiting, fever of 103, night sweats by IMTS.   Initial CXR negative as well as [place] of ABD.  Empirically treated with vanco/zosyn.  8/11 CT ABD/ Pelvis c/w enteritis.  Post CT, developed presumed contrast nephropathy complicated by vancomycin administration.  EGD performed on 8/12 which demonstrated mild duodenitis & gastritis.  Developed watery diarrhea and underwent colonoscopy on 8/14 which was normal except for internal hemorrhoids.  8/18 Received 1 unit PRBC's, repeated 8/19 with lasix.  Became SOB after PRBC's.  PCCM consulted 8/19 for dyspnea.    Lines / Drains:  Cultures: See Micro Section - extensive cultures  Antibiotics: Per ID  Tests / Events: 8/10 >>>admit with fever, abd [place] Contracted gallbladder. No findings to suggest acute cholecystitis. Otherwise negative abdominal ultrasound.  8/11 CT ABD / Pelvis>>>Air fluid levels in nondilated small bowel and throughout the colon including the distal colon, suggesting diarrheal process and enteritis. No significant abnormal bowel wall thickening is observed  8/11 CT Head>>>Chronic bilateral maxillary ethmoid sinusitis. Otherwise, no significant abnormality identified.  8/14 Renal US>>>Bilateral echogenic kidneys, consistent with medical renal disease. No evidence for hydronephrosis.  8/15 LP>>>neg  8/19 ECHO>>>Left ventricle: The cavity size was normal. Systolic function was normal. The estimated ejection fraction was in the range of 55% to 60%. Wall motion was normal;  there were no regional wall motion abnormalities. Pericardium, extracardiac: A small, free-flowing pericardial effusion was identified posterior to the heart. The fluid had no internal echoes. Features were not consistent with tamponade physiology.   Past Medical History  Diagnosis Date  . HIV disease     CD4 = 10, VL = 475K, 03/21/12.  Medication non-compliance  . Hepatitis B     03/21/12 - sAg neg, sAb pos, cAb pos, indication prior infection    Past Surgical History  Procedure Date  . Esophagogastroduodenoscopy 05/13/2012    Procedure: ESOPHAGOGASTRODUODENOSCOPY (EGD);  Surgeon: Shirley Friar, MD;  Location: [facility] ENDOSCOPY;  Service: Endoscopy;  Laterality: N/A;  . Colonoscopy 05/15/2012    Procedure: COLONOSCOPY;  Surgeon: Shirley Friar, MD;  Location: [facility] ENDOSCOPY;  Service: Endoscopy;  Laterality: N/A;  unprepped    Prior to Admission medications   Medication Sig Start Date End Date Taking? Authorizing Provider  azithromycin (ZITHROMAX) 600 MG tablet Take 2 tablets (1,200 mg total) by mouth every 7 (seven) days. 05/10/12 06/09/12 Yes Judyann Munson, MD  dapsone 100 MG tablet Take 1 tablet (100 mg total) by mouth daily. 05/10/12  Yes Judyann Munson, MD  darunavir (PREZISTA) 600 MG tablet Take 1 tablet (600 mg total) by mouth 2 (two) times daily with a meal. 05/10/12 05/10/13 Yes Judyann Munson, MD  emtricitabine-tenofovir (TRUVADA) 200-300 MG per tablet Take 1 tablet by mouth daily. 05/10/12 05/10/13 Yes Judyann Munson, MD  fluconazole (DIFLUCAN) 200 MG tablet Take 200 mg by mouth daily.   Yes Historical Provider, MD  ritonavir (NORVIR) 100 MG TABS Take 1 tablet (100 mg total) by mouth daily. 05/16/12   Judyann Munson, MD  Allergies Allergies  Allergen Reactions  . Fruit & Vegetable Daily (Nutritional Supplements)     Allergic to RAW fruits and vegetables .  Marland Kitchen Bactrim (Sulfamethoxazole W-Trimethoprim) Itching, Rash and Other (See Comments)    "flu like symptoms"    Family  History History reviewed. No pertinent family history.  Social History  reports that he has been smoking Cigarettes.  He has been smoking about .2 packs per day. He has never used smokeless tobacco. He reports that he drinks about 1.5 ounces of alcohol per week. He reports that he does not use illicit drugs.  Review Of Systems:   Gen: Denies weight change, fatigue.  Indicates fever, chills, night sweats HEENT: Denies blurred vision, double vision, hearing loss, tinnitus, sinus congestion, rhinorrhea, sore throat, neck stiffness, dysphagia PULM: Denies hemoptysis, wheezing.  Indicates shortness of breath, cough, thick sputum production, CV: Denies chest pain, orthopnea, paroxysmal nocturnal dyspnea, palpitations.  LE edema. GI: Denies nausea, vomiting, diarrhea, hematochezia, melena, constipation, change in bowel habits.  Now c/o abd pain GU: Denies dysuria, hematuria, polyuria, oliguria, urethral discharge Endocrine: Denies hot or cold intolerance, polyuria, polyphagia or appetite change Derm: Denies rash, dry skin, scaling or peeling skin change Heme: Denies easy bruising, bleeding, bleeding gums Neuro: Denies headache, numbness, weakness, slurred speech, loss of memory or consciousness   Vital Signs: Temp:  [98 F (36.7 C)-102.4 F (39.1 C)] 98 F (36.7 C) (08/19 1615) Pulse Rate:  [109-131] 126  (08/19 1315) Resp:  [22-33] 33  (08/19 1315) BP: (122-150)/(61-91) 125/73 mmHg (08/19 1315) SpO2:  [91 %-99 %] 99 % (08/19 1435) FiO2 (%):  [40 %] 40 % (08/19 1435) Weight:  [216 lb 7.9 oz (98.2 kg)] 216 lb 7.9 oz (98.2 kg) (08/19 0340) I/O last 3 completed shifts: In: 1650.5 [P.O.:1200; I.V.:50; Blood:400.5] Out: 3510 [Urine:3510]  Physical Examination: General: wdwn adult male in NAD Neuro: AAOx4, speech clear, MAE CV: s1s2 rrr, no m/r/g PULM: resp's even/non-labored, lungs bilaterally essentially clear GI: distended, tight Extremities: warm/dry, 1-2+ edema   Ventilator  settings: Vent Mode:  [-]  FiO2 (%):  [40 %] 40 %  Labs    CBC  Lab 05/20/12 1406 05/20/12 0500 05/19/12 1930  HGB 8.2* 7.0* 7.5*  HCT 23.7* 20.1* 21.4*  WBC 18.0* 16.0* 15.2*  PLT 226 212 215     BMET  Lab 05/20/12 0500 05/19/12 0505 05/18/12 1823 05/18/12 0630 05/17/12 2134 05/17/12 1230 05/17/12 0506  NA 138 140 137 136 136 -- --  K 3.5 3.0* -- -- -- -- --  CL 102 100 96 97 99 -- --  CO2 22 26 26 26 21  -- --  GLUCOSE 88 95 101* 118* 110* -- --  BUN 57* 58* 56* 57* 57* -- --  CREATININE 6.07* 6.21* 5.94* 5.84* 6.00* -- --  CALCIUM 8.0* 7.9* 7.8* 7.9* 7.7* -- --  MG 1.9 2.1 -- 2.3 -- 1.5 --  PHOS 4.5 3.8 -- 1.6* 2.0* -- 0.7*    Lab 05/14/12 0954  INR 1.38    Lab 05/20/12 1608 05/14/12 0800  PHART 7.427 7.414  PCO2ART 32.6* 20.3*  PO2ART 81.9 79.3*  HCO3 21.1 12.7*  TCO2 22.1 13.4  O2SAT 95.6 95.1     Radiology: 8/19 CXR>>>bilateral lower atx vs infiltrate  Assessment and Plan: Principal Problem:  *Fever Active Problems:  Human immunodeficiency virus (HIV) disease  Nausea & vomiting  Odynophagia  Diarrhea  Pharyngitis  Acute kidney injury  Thrombocytopenia  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   Extensive  work up during this admit with essentially negative cultures from sputum, stool, LP, EGD, Colonoscopy with unclear source of fever.  On exam patient has large, distended abd and c/o abd pain -difficult for him to even sit up in bed due to pain.  Dyspnea Assessment:  CXR reviewed with lower lobe atelectasis vs infiltrate -likely secondary to distended abd / pain with splinting.  Pulmonary exam essentially negative.  Consider PNA but does not display clinical findings to support dx.  Also, consideration for CT sinus findings?? Contributing to fever but would have already been treated for sinusitis or HCAP.    Plan: -O2 removed, remains >97% on RA -pulmonary hygiene, PT  -abx per ID -would re-image abd without IV contrast vs abdominal US   Canary Brim,  NP-C [facility] Pgr: [phone] 05/20/2012, 4:58 PM    Attending Addendum:  I have seen the patient, discussed the issues, test results and plans with B. Veleta Miners, NP. I agree with the Assessment and Plans as ammended above.   Levy Pupa, MD, PhD 05/20/2012, 5:58 PM [facility] [phone] or if no answer [phone]

## 2012-05-20 NOTE — Progress Notes (Signed)
Assessment/Plan:  Mr. Noyola is a 31 yo B man, history of HIV (CD4 = 10, VL = 475K, 03/21/12), prior HBV (sAg neg, sAb pos, cAb pos, 03/21/12), presenting 8/10 with nausea, vomiting,diarrhea, SOB, and fevers, consulted for acute renal failure.  1. Acute Renal Failure - Nonoliguric ? Early recovery.  Supportive therapy. 2. HIV - CD4 = 10 --per primary team   Subjective: Interval History: No active focused complaints Objective: Vital signs in last 24 hours: Temp:  [98.4 F (36.9 C)-102.4 F (39.1 C)] 100.4 F (38 C) (08/19 1220) Pulse Rate:  [109-131] 118  (08/19 1220) Resp:  [22-36] 32  (08/19 1220) BP: (121-154)/(61-91) 135/77 mmHg (08/19 1220) SpO2:  [91 %-98 %] 95 % (08/19 0938) Weight:  [98.2 kg (216 lb 7.9 oz)] 98.2 kg (216 lb 7.9 oz) (08/19 0340) Weight change: -5.9 kg (-13 lb 0.1 oz) Intake/Output from previous day: 08/18 0701 - 08/19 0700 In: 1170.5 [P.O.:720; I.V.:50; Blood:400.5] Out: 3160 [Urine:3160] Intake/Output this shift:   General appearance: distracted, fatigued and moderate distress Head: Normocephalic, without obvious abnormality, atraumatic Chest wall: no tenderness GI: soft, non-tender; bowel sounds normal; no masses,  no organomegaly Extremities: edema 2+ Neuro Patient awakens but appears uncomfortable Lab Results:  Basename 05/20/12 0500 05/19/12 1930  WBC 16.0* 15.2*  HGB 7.0* 7.5*  HCT 20.1* 21.4*  PLT 212 215   BMET:  Basename 05/20/12 0500 05/19/12 0505  NA 138 140  K 3.5 3.0*  CL 102 100  CO2 22 26  GLUCOSE 88 95  BUN 57* 58*  CREATININE 6.07* 6.21*  CALCIUM 8.0* 7.9*   No results found for this basename: PTH:2 in the last 72 hours Iron Studies: No results found for this basename: IRON,TIBC,TRANSFERRIN,FERRITIN in the last 72 hours Studies/Results: No results found. Scheduled:   . atovaquone  1,500 mg Oral Q breakfast  . azithromycin  600 mg Oral Daily  . darunavir  800 mg Oral Daily  . ethambutol  15 mg/kg Oral Daily  .  fluconazole  100 mg Oral Q24H  . furosemide  20 mg Intravenous Once  . furosemide  20 mg Intravenous Once  . lamiVUDine  50 mg Oral Daily  . pantoprazole  40 mg Oral Q1200  . potassium chloride  40 mEq Oral Q4H  . raltegravir  400 mg Oral BID  . ritonavir  100 mg Oral Daily  . senna  1 tablet Oral BID  . white petrolatum        LOS: 9 days   Yomar Mejorado C 05/20/2012,12:34 PM

## 2012-05-20 NOTE — Progress Notes (Signed)
PT Cancellation Note  Treatment cancelled today due to patient's refusal to participate. Pt in bed on arrival with HR 127 with one unit of blood complete. Pt refusing mobility today despite education for benefit for his strength and lungs. Girlfriend present and states she will try to get him to move this afternoon. Will attempt at later date.   Delaney Meigs, PT [phone]

## 2012-05-20 NOTE — Progress Notes (Signed)
[facility]    Date of Admission:  05/11/2012   Total days of antibiotics: 8        Day Azithromycin: 8        Day Ethambutol: 8        Day Mepron: 3                                                                                     Day Ritonavir, lamivudine, raltegravir, darunavir: 3  Principal Problem:  *Fever Active Problems:  Human immunodeficiency virus (HIV) disease  Nausea & vomiting  Odynophagia  Diarrhea  Pharyngitis  Acute kidney injury  Thrombocytopenia  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      . atovaquone  1,500 mg Oral Q breakfast  . azithromycin  600 mg Oral Daily  . darunavir  800 mg Oral Daily  . ethambutol  15 mg/kg Oral Daily  . fluconazole  100 mg Oral Q24H  . furosemide  20 mg Intravenous Once  . lamiVUDine  50 mg Oral Daily  . pantoprazole  40 mg Oral Q1200  . potassium chloride  40 mEq Oral Q4H  . raltegravir  400 mg Oral BID  . ritonavir  100 mg Oral Daily  . senna  1 tablet Oral BID  . white petrolatum        Subjective: Zachary Hill complaints of abdominal pain and and diarrhea. He stills spikes ferv with a T. Max 102.9.  Objective: Temp:  [98.4 F (36.9 C)-102.4 F (39.1 C)] 98.6 F (37 C) (08/19 0800) Pulse Rate:  [109-131] 117  (08/19 0800) Resp:  [22-36] 24  (08/19 0800) BP: (121-154)/(61-92) 123/61 mmHg (08/19 0800) SpO2:  [91 %-98 %] 97 % (08/19 0800) Weight:  [98.2 kg (216 lb 7.9 oz)] 98.2 kg (216 lb 7.9 oz) (08/19 0340)  General: lying in bed, A&OX3 HEENT: +bilateral conjunctival injection noted, Thrush noted on tongue  Neck: supple, no lymphadenopathy Lungs: CTAB, no wheezes, rales, ronchi Heart: RRR, no murmurs, gallops, or rubs Abdomen: soft, non-tender, non-distended, normal bowel sounds  Extremities: no cyanosis, clubbing, or edema   Lab Results Lab Results  Component Value Date   WBC 16.0* 05/20/2012   HGB 7.0* 05/20/2012   HCT 20.1* 05/20/2012   MCV 77.9* 05/20/2012   PLT 212 05/20/2012    Lab Results  Component Value Date   CREATININE 6.07* 05/20/2012   BUN 57* 05/20/2012   NA 138 05/20/2012   K 3.5 05/20/2012   CL 102 05/20/2012   CO2 22 05/20/2012    Lab Results  Component Value Date   ALT 26 05/20/2012   AST 59* 05/20/2012   ALKPHOS 87 05/20/2012   BILITOT 1.1 05/20/2012      Microbiology: Recent Results (from the past 240 hour(s))  URINE CULTURE     Status: Normal   Collection Time   05/11/12 12:18 PM      Component Value Range Status Comment   Specimen Description URINE, CATHETERIZED   Final    Special Requests NONE   Final    Culture  Setup Time 05/11/2012 22:56   Final    Colony Count NO GROWTH  Final    Culture NO GROWTH   Final    Report Status 05/12/2012 FINAL   Final   CULTURE, BLOOD (ROUTINE X 2)     Status: Normal   Collection Time   05/11/12  4:10 PM      Component Value Range Status Comment   Specimen Description BLOOD LEFT ARM   Final    Special Requests BOTTLES DRAWN AEROBIC AND ANAEROBIC 10CC   Final    Culture  Setup Time 05/11/2012 22:55   Final    Culture NO GROWTH 5 DAYS   Final    Report Status 05/17/2012 FINAL   Final   CULTURE, BLOOD (ROUTINE X 2)     Status: Normal   Collection Time   05/11/12  4:25 PM      Component Value Range Status Comment   Specimen Description BLOOD LEFT HAND   Final    Special Requests BOTTLES DRAWN AEROBIC AND ANAEROBIC 10CC   Final    Culture  Setup Time 05/11/2012 22:55   Final    Culture NO GROWTH 5 DAYS   Final    Report Status 05/17/2012 FINAL   Final   AFB CULTURE, BLOOD     Status: Normal (Preliminary result)   Collection Time   05/12/12 12:33 PM      Component Value Range Status Comment   Specimen Description BLOOD LEFT ARM   Final    Special Requests BOTTLES DRAWN AEROBIC ONLY 3CC   Final    Culture     Final    Value: CULTURE WILL BE EXAMINED FOR 6 WEEKS BEFORE ISSUING A FINAL REPORT   Report Status PENDING   Incomplete   MRSA PCR SCREENING     Status: Normal   Collection Time   05/12/12  3:35  PM      Component Value Range Status Comment   MRSA by PCR NEGATIVE  NEGATIVE Final   CULTURE, EXPECTORATED SPUTUM-ASSESSMENT     Status: Normal   Collection Time   05/12/12  5:04 PM      Component Value Range Status Comment   Specimen Description SPUTUM   Final    Special Requests NONE   Final    Sputum evaluation     Final    Value: MICROSCOPIC FINDINGS SUGGEST THAT THIS SPECIMEN IS NOT REPRESENTATIVE OF LOWER RESPIRATORY SECRETIONS. PLEASE RECOLLECT.     CALLED TO MATTHEWS,M RN 05/12/12 1745 WOOTEN,K   Report Status 05/12/2012 FINAL   Final   STOOL CULTURE     Status: Normal   Collection Time   05/12/12  7:01 PM      Component Value Range Status Comment   Specimen Description STOOL   Final    Special Requests Immunocompromised   Final    Culture     Final    Value: NO SALMONELLA, SHIGELLA, CAMPYLOBACTER, YERSINIA, OR E.COLI 0157:H7 ISOLATED     Note: REDUCED NORMAL FLORA PRESENT   Report Status 05/16/2012 FINAL   Final   OVA AND PARASITE EXAMINATION     Status: Normal   Collection Time   05/12/12  7:01 PM      Component Value Range Status Comment   Specimen Description STOOL   Final    Special Requests NONE   Final    Ova and parasites NO OVA OR PARASITES SEEN   Final    Report Status 05/15/2012 FINAL   Final   CRYPTOSPORIDIUM SMEAR, FECAL     Status: Normal   Collection  Time   05/12/12  7:01 PM      Component Value Range Status Comment   Specimen Description STOOL   Final    Special Requests NONE   Final    Cryptosporidium Smear. NO Cryptosporidium Cyclospora or Isospora seen.   Final    Report Status 05/14/2012 FINAL   Final   AFB CULTURE, BLOOD     Status: Normal (Preliminary result)   Collection Time   05/13/12  9:35 AM      Component Value Range Status Comment   Specimen Description BLOOD LEFT ARM   Final    Special Requests 5CC   Final    Culture     Final    Value: CULTURE WILL BE EXAMINED FOR 6 WEEKS BEFORE ISSUING A FINAL REPORT   Report Status PENDING    Incomplete   CULTURE, EXPECTORATED SPUTUM-ASSESSMENT     Status: Normal   Collection Time   05/13/12 11:03 AM      Component Value Range Status Comment   Specimen Description SPUTUM   Final    Special Requests Immunocompromised   Final    Sputum evaluation     Final    Value: MICROSCOPIC FINDINGS SUGGEST THAT THIS SPECIMEN IS NOT REPRESENTATIVE OF LOWER RESPIRATORY SECRETIONS. PLEASE RECOLLECT.     Gram Stain Report Called to,Read Back By and Verified With: Ernestina Penna RN 12:05 05/13/12 ([email or web address])   Report Status 05/13/2012 FINAL   Final   FUNGUS CULTURE W SMEAR     Status: Normal (Preliminary result)   Collection Time   05/13/12  4:41 PM      Component Value Range Status Comment   Specimen Description TISSUE ESOPHAGUS   Final    Special Requests NONE   Final    Fungal Smear NO YEAST OR FUNGAL ELEMENTS SEEN   Final    Culture CULTURE IN PROGRESS FOR FOUR WEEKS   Final    Report Status PENDING   Incomplete   VIRAL CULTURE VIRC     Status: Normal   Collection Time   05/13/12  4:41 PM      Component Value Range Status Comment   Specimen Description TISSUE ESOPHAGUS LOOK FOR CMV AND HERPES   Final    Special Requests NONE   Final    Culture     Final    Value: No Herpes Simplex Detected in Cell Culture No Cytomegalovirus identified in cell culture                                                                A negative result does not exclude the possibility of CMV infection;inappropriate specimen      collection,storage and transport may lead to false negative results.   Report Status 05/17/2012 FINAL   Final   CLOSTRIDIUM DIFFICILE BY PCR     Status: Normal   Collection Time   05/14/12  3:53 PM      Component Value Range Status Comment   C difficile by pcr NEGATIVE  NEGATIVE Final   FUNGUS CULTURE W SMEAR     Status: Normal (Preliminary result)   Collection Time   05/15/12  3:54 PM      Component Value Range Status Comment   Specimen Description TISSUE   Final  Special Requests      Final    Value: SAMPLE NO 1 SPECIMEN IN CUP TRANSVERSE AND DESC COLON   Fungal Smear NO YEAST OR FUNGAL ELEMENTS SEEN   Final    Culture CULTURE IN PROGRESS FOR FOUR WEEKS   Final    Report Status PENDING   Incomplete   VIRAL CULTURE VIRC     Status: Normal (Preliminary result)   Collection Time   05/15/12  3:54 PM      Component Value Range Status Comment   Specimen Description BIOPSY SIGMOID AND RECTAL LOOK FOR HSV AND CMV   Final    Special Requests NONE   Final    Culture Culture has been initiated.   Final    Report Status PENDING   Incomplete   CSF CULTURE     Status: Normal (Preliminary result)   Collection Time   05/16/12  5:20 PM      Component Value Range Status Comment   Specimen Description CSF   Final    Special Requests TUBE 2@6 .5CC   Final    Gram Stain     Final    Value: CYTOSPIN WBC PRESENT,BOTH PMN AND MONONUCLEAR     NO ORGANISMS SEEN     Performed at [facility]   Culture NO GROWTH 2 DAYS   Final    Report Status PENDING   Incomplete   GRAM STAIN     Status: Normal   Collection Time   05/16/12  5:20 PM      Component Value Range Status Comment   Specimen Description CSF   Final    Special Requests TUBE 2@6 .5CC   Final    Gram Stain     Final    Value: CYTOSPIN SLIDE     NO ORGANISMS SEEN     WBC PRESENT,BOTH PMN AND MONONUCLEAR   Report Status 05/16/2012 FINAL   Final   TOXOPLASMA GONDII, PCR     Status: Normal   Collection Time   05/16/12  5:20 PM      Component Value Range Status Comment   Source - TGPCR CSF   Final    Toxoplasma gondii, DNA ql Not Detected  Not Detected Final   CLOSTRIDIUM DIFFICILE BY PCR     Status: Normal   Collection Time   05/17/12  7:09 PM      Component Value Range Status Comment   C difficile by pcr NEGATIVE  NEGATIVE Final   CULTURE, BLOOD (ROUTINE X 2)     Status: Normal (Preliminary result)   Collection Time   05/18/12  6:23 PM      Component Value Range Status Comment   Specimen Description BLOOD RIGHT ARM    Final    Special Requests BOTTLES DRAWN AEROBIC AND ANAEROBIC 10CC   Final    Culture  Setup Time 05/19/2012 03:55   Final    Culture     Final    Value:        BLOOD CULTURE RECEIVED NO GROWTH TO DATE CULTURE WILL BE HELD FOR 5 DAYS BEFORE ISSUING A FINAL NEGATIVE REPORT   Report Status PENDING   Incomplete     Studies/Results: No results found.  Assessment/Plan:  Zachary Hill is a 31 yo male with, history of HIV (CD4 = 10, VL = 475K, 03/21/12), prior HBV presenting 8/10 with nausea, vomiting, SOB, and fevers, hospital course complicated by acute renal failure.  He still spikes fever with T max of 102.9  this morning. Plaletels trending up and is 212 today. Hb of 7.5 after receiving one pack RBC on 08/16. Had a colonoscopy on 08/ 14 which showed normally appearing colon and multiple biopsies were taken, with cultures WITH NO GROWTH OF HSV OR CMV IN VIRAL CULTURE Anti retrovirals started on 08/16 and Dapsone was changed to Mepron on 08/17. CMV PCR from CSF fluid <200 copies/ml. Toxoplasma DNA from CSF fluid not detected.   Dr. Luisa Hart histochemistry stains and AFB for tissue for CMV, HSV, MAC.   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 Please see their note for complete details. I concur with their findings with the following additions/corrections:  Patient has worsned from respiratory standpoint over the course of the day. His PCP smear on sputum is negative. He received blood transfusion today so I wonder about TRALI or volume overload in context of his renal failure and agree with attempts to diurese and to assess with 2 d echo. I also think it is reasonable to add imipenem to cover for possible  HCAP/aspiration pathogens. I would NOT add vancomycin at this point or zyvox  (latter due to concerns for possible marrow toxicity in complicated pt, former due to recent renal insult). One could consider some other agents such as tygacil or teflaro but dont wanto to got that broad yet in pt with polypharmacy.  With regards to idea of rx for PCP, the patients current dose of Atovaquone which is for prophylaxis is actually the SAME AS TREATMENT DOSE ATOVAQUONE FOR PCP.  (though one would also want to consider whether or not to add steroids esp if the pt were hypoxic)  I think it would be prudent to ask for help from CCM as bronchoscopy with BAL for quantitative cultures, AFB cultures (thought I do not suspect TB very much), legionella culture, cultures for nocardia and  repeat PCP smear might be very helpful.  I also agree with hematology consult. If bone marrow IS done I would make sure it is sent for AFB smear and culture, fungal smear and culture  Paulette Blanch Dam 05/20/2012, 5:02 PM

## 2012-05-20 NOTE — Progress Notes (Signed)
ANTIBIOTIC CONSULT NOTE - INITIAL  Pharmacy Consult for Primaxin Indication: rule out pneumonia  Allergies  Allergen Reactions  . Fruit & Vegetable Daily (Nutritional Supplements)     Allergic to RAW fruits and vegetables .  Marland Kitchen Bactrim (Sulfamethoxazole W-Trimethoprim) Itching, Rash and Other (See Comments)    "flu like symptoms"    Patient Measurements: Height: 5\' 6"  (167.6 cm) Weight: 216 lb 7.9 oz (98.2 kg) IBW/kg (Calculated) : 63.8   Vital Signs: Temp: 98 F (36.7 C) (08/19 1615) Temp src: Oral (08/19 1615) BP: 125/73 mmHg (08/19 1315) Pulse Rate: 126  (08/19 1315) Intake/Output from previous day: 08/18 0701 - 08/19 0700 In: 1170.5 [P.O.:720; I.V.:50; Blood:400.5] Out: 3160 [Urine:3160] Intake/Output from this shift: Total I/O In: 620 [P.O.:250; Blood:350; IV Piggyback:20] Out: 350 [Urine:350]  Labs:  Basename 05/20/12 1406 05/20/12 0500 05/19/12 1930 05/19/12 0505 05/18/12 1823  WBC 18.0* 16.0* 15.2* -- --  HGB 8.2* 7.0* 7.5* -- --  PLT 226 212 215 -- --  LABCREA -- -- -- -- --  CREATININE -- 6.07* -- 6.21* 5.94*   Estimated Creatinine Clearance: 19.4 ml/min (by C-G formula based on Cr of 6.07). No results found for this basename: VANCOTROUGH:2,VANCOPEAK:2,VANCORANDOM:2,GENTTROUGH:2,GENTPEAK:2,GENTRANDOM:2,TOBRATROUGH:2,TOBRAPEAK:2,TOBRARND:2,AMIKACINPEAK:2,AMIKACINTROU:2,AMIKACIN:2, in the last 72 hours   Microbiology: Recent Results (from the past 720 hour(s))  URINE CULTURE     Status: Normal   Collection Time   05/11/12 12:18 PM      Component Value Range Status Comment   Specimen Description URINE, CATHETERIZED   Final    Special Requests NONE   Final    Culture  Setup Time 05/11/2012 22:56   Final    Colony Count NO GROWTH   Final    Culture NO GROWTH   Final    Report Status 05/12/2012 FINAL   Final   CULTURE, BLOOD (ROUTINE X 2)     Status: Normal   Collection Time   05/11/12  4:10 PM      Component Value Range Status Comment   Specimen  Description BLOOD LEFT ARM   Final    Special Requests BOTTLES DRAWN AEROBIC AND ANAEROBIC 10CC   Final    Culture  Setup Time 05/11/2012 22:55   Final    Culture NO GROWTH 5 DAYS   Final    Report Status 05/17/2012 FINAL   Final   CULTURE, BLOOD (ROUTINE X 2)     Status: Normal   Collection Time   05/11/12  4:25 PM      Component Value Range Status Comment   Specimen Description BLOOD LEFT HAND   Final    Special Requests BOTTLES DRAWN AEROBIC AND ANAEROBIC 10CC   Final    Culture  Setup Time 05/11/2012 22:55   Final    Culture NO GROWTH 5 DAYS   Final    Report Status 05/17/2012 FINAL   Final   AFB CULTURE, BLOOD     Status: Normal (Preliminary result)   Collection Time   05/12/12 12:33 PM      Component Value Range Status Comment   Specimen Description BLOOD LEFT ARM   Final    Special Requests BOTTLES DRAWN AEROBIC ONLY 3CC   Final    Culture     Final    Value: CULTURE WILL BE EXAMINED FOR 6 WEEKS BEFORE ISSUING A FINAL REPORT   Report Status PENDING   Incomplete   MRSA PCR SCREENING     Status: Normal   Collection Time   05/12/12  3:35 PM  Component Value Range Status Comment   MRSA by PCR NEGATIVE  NEGATIVE Final   CULTURE, EXPECTORATED SPUTUM-ASSESSMENT     Status: Normal   Collection Time   05/12/12  5:04 PM      Component Value Range Status Comment   Specimen Description SPUTUM   Final    Special Requests NONE   Final    Sputum evaluation     Final    Value: MICROSCOPIC FINDINGS SUGGEST THAT THIS SPECIMEN IS NOT REPRESENTATIVE OF LOWER RESPIRATORY SECRETIONS. PLEASE RECOLLECT.     CALLED TO MATTHEWS,M RN 05/12/12 1745 WOOTEN,K   Report Status 05/12/2012 FINAL   Final   STOOL CULTURE     Status: Normal   Collection Time   05/12/12  7:01 PM      Component Value Range Status Comment   Specimen Description STOOL   Final    Special Requests Immunocompromised   Final    Culture     Final    Value: NO SALMONELLA, SHIGELLA, CAMPYLOBACTER, YERSINIA, OR E.COLI 0157:H7  ISOLATED     Note: REDUCED NORMAL FLORA PRESENT   Report Status 05/16/2012 FINAL   Final   OVA AND PARASITE EXAMINATION     Status: Normal   Collection Time   05/12/12  7:01 PM      Component Value Range Status Comment   Specimen Description STOOL   Final    Special Requests NONE   Final    Ova and parasites NO OVA OR PARASITES SEEN   Final    Report Status 05/15/2012 FINAL   Final   CRYPTOSPORIDIUM SMEAR, FECAL     Status: Normal   Collection Time   05/12/12  7:01 PM      Component Value Range Status Comment   Specimen Description STOOL   Final    Special Requests NONE   Final    Cryptosporidium Smear. NO Cryptosporidium Cyclospora or Isospora seen.   Final    Report Status 05/14/2012 FINAL   Final   AFB CULTURE, BLOOD     Status: Normal (Preliminary result)   Collection Time   05/13/12  9:35 AM      Component Value Range Status Comment   Specimen Description BLOOD LEFT ARM   Final    Special Requests 5CC   Final    Culture     Final    Value: CULTURE WILL BE EXAMINED FOR 6 WEEKS BEFORE ISSUING A FINAL REPORT   Report Status PENDING   Incomplete   CULTURE, EXPECTORATED SPUTUM-ASSESSMENT     Status: Normal   Collection Time   05/13/12 11:03 AM      Component Value Range Status Comment   Specimen Description SPUTUM   Final    Special Requests Immunocompromised   Final    Sputum evaluation     Final    Value: MICROSCOPIC FINDINGS SUGGEST THAT THIS SPECIMEN IS NOT REPRESENTATIVE OF LOWER RESPIRATORY SECRETIONS. PLEASE RECOLLECT.     Gram Stain Report Called to,Read Back By and Verified With: Ernestina Penna RN 12:05 05/13/12 ([email or web address])   Report Status 05/13/2012 FINAL   Final   FUNGUS CULTURE W SMEAR     Status: Normal (Preliminary result)   Collection Time   05/13/12  4:41 PM      Component Value Range Status Comment   Specimen Description TISSUE ESOPHAGUS   Final    Special Requests NONE   Final    Fungal Smear NO YEAST OR FUNGAL ELEMENTS SEEN   Final  Culture CULTURE IN PROGRESS  FOR FOUR WEEKS   Final    Report Status PENDING   Incomplete   VIRAL CULTURE VIRC     Status: Normal   Collection Time   05/13/12  4:41 PM      Component Value Range Status Comment   Specimen Description TISSUE ESOPHAGUS LOOK FOR CMV AND HERPES   Final    Special Requests NONE   Final    Culture     Final    Value: No Herpes Simplex Detected in Cell Culture No Cytomegalovirus identified in cell culture                                                                A negative result does not exclude the possibility of CMV infection;inappropriate specimen      collection,storage and transport may lead to false negative results.   Report Status 05/17/2012 FINAL   Final   CLOSTRIDIUM DIFFICILE BY PCR     Status: Normal   Collection Time   05/14/12  3:53 PM      Component Value Range Status Comment   C difficile by pcr NEGATIVE  NEGATIVE Final   FUNGUS CULTURE W SMEAR     Status: Normal (Preliminary result)   Collection Time   05/15/12  3:54 PM      Component Value Range Status Comment   Specimen Description TISSUE   Final    Special Requests     Final    Value: SAMPLE NO 1 SPECIMEN IN CUP TRANSVERSE AND DESC COLON   Fungal Smear NO YEAST OR FUNGAL ELEMENTS SEEN   Final    Culture CULTURE IN PROGRESS FOR FOUR WEEKS   Final    Report Status PENDING   Incomplete   VIRAL CULTURE VIRC     Status: Normal   Collection Time   05/15/12  3:54 PM      Component Value Range Status Comment   Specimen Description BIOPSY SIGMOID AND RECTAL LOOK FOR HSV AND CMV   Final    Special Requests NONE   Final    Culture     Final    Value: No Herpes Simplex Detected in Cell Culture No Cytomegalovirus identified in cell culture                                                                A negative result does not exclude the possibility of CMV infection;inappropriate specimen      collection,storage and transport may lead to false negative results.   Report Status 05/20/2012 FINAL   Final   CSF CULTURE      Status: Normal   Collection Time   05/16/12  5:20 PM      Component Value Range Status Comment   Specimen Description CSF   Final    Special Requests TUBE 2@6 .5CC   Final    Gram Stain     Final    Value: CYTOSPIN WBC PRESENT,BOTH PMN AND MONONUCLEAR     NO ORGANISMS SEEN  Performed at [facility]   Culture NO GROWTH 3 DAYS   Final    Report Status 05/20/2012 FINAL   Final   GRAM STAIN     Status: Normal   Collection Time   05/16/12  5:20 PM      Component Value Range Status Comment   Specimen Description CSF   Final    Special Requests TUBE 2@6 .5CC   Final    Gram Stain     Final    Value: CYTOSPIN SLIDE     NO ORGANISMS SEEN     WBC PRESENT,BOTH PMN AND MONONUCLEAR   Report Status 05/16/2012 FINAL   Final   TOXOPLASMA GONDII, PCR     Status: Normal   Collection Time   05/16/12  5:20 PM      Component Value Range Status Comment   Source - TGPCR CSF   Final    Toxoplasma gondii, DNA ql Not Detected  Not Detected Final   CLOSTRIDIUM DIFFICILE BY PCR     Status: Normal   Collection Time   05/17/12  7:09 PM      Component Value Range Status Comment   C difficile by pcr NEGATIVE  NEGATIVE Final   CULTURE, BLOOD (ROUTINE X 2)     Status: Normal (Preliminary result)   Collection Time   05/18/12  6:23 PM      Component Value Range Status Comment   Specimen Description BLOOD RIGHT ARM   Final    Special Requests BOTTLES DRAWN AEROBIC AND ANAEROBIC 10CC   Final    Culture  Setup Time 05/19/2012 03:55   Final    Culture     Final    Value:        BLOOD CULTURE RECEIVED NO GROWTH TO DATE CULTURE WILL BE HELD FOR 5 DAYS BEFORE ISSUING A FINAL NEGATIVE REPORT   Report Status PENDING   Incomplete   PNEUMOCYSTIS JIROVECI SMEAR BY DFA     Status: Normal   Collection Time   05/20/12  9:30 AM      Component Value Range Status Comment   Specimen Source-PJSRC SPUTUM   Final    Pneumocystis jiroveci Ag NEGATIVE   Final Performed at [facility]    Medical  History: Past Medical History  Diagnosis Date  . HIV disease     CD4 = 10, VL = 475K, 03/21/12.  Medication non-compliance  . Hepatitis B     03/21/12 - sAg neg, sAb pos, cAb pos, indication prior infection    Assessment: Zachary Hill with AIDS off HAART for a year, admitted on 8/10 for fever. Now CXR shows new B/L infiltrates, to start empiric Primaxin to cover gram negative organisms such as pseudomonas. Tm = 102.5, wbc = 18. Pt. Now developed acute renal failure, scr = 6.07, est. crcl ~ 20ml/min.   Plan:  - Primaxin 250mg  IV Q 12hrs - f/u renal function change and cultures.  Bayard Hugger, PharmD, BCPS  Clinical Pharmacist  Pager: [phone]  05/20/2012,4:37 PM

## 2012-05-20 NOTE — Progress Notes (Signed)
OT Cancellation Note  Treatment cancelled today due to:  Pt reporting SOB and declining OT at this time. RN at bedside addressing SOB. OT to re-attempt evaluation at a later date / time.    Harrel Carina Cassoday   OTR/L Pager: [phone] Office: [phone] .

## 2012-05-21 LAB — TYPE AND SCREEN: Unit division: 0

## 2012-05-21 NOTE — Progress Notes (Signed)
Progress Note  At about 12:30 am on 05/21/12, I called the [facility] hospital ED.  The patient safely arrived at the ED, and was being seen by an attending.  I spoke with the attending, informed him of the details of his current hospital admission, including the labs and procedures performed.  With the patient's consent, I faxed him a discharge summary and other pertinent info related to the patient's case, and provided him with the contact information for our service.  The attending expressed gratitude and understanding.  Signed, Janalyn Harder, PGY2 05/21/2012, 7:22 AM

## 2012-05-22 LAB — HLA B*5701

## 2012-05-25 LAB — CULTURE, BLOOD (ROUTINE X 2): Culture: NO GROWTH

## 2012-05-27 LAB — VIRAL CULTURE VIRC

## 2012-05-29 ENCOUNTER — Telehealth: Payer: Self-pay | Admitting: Infectious Disease

## 2012-05-29 LAB — CYTOMEGALOVIRUS PCR, QUALITATIVE

## 2012-05-29 NOTE — Telephone Encounter (Signed)
Call from MD from [facility] re pt

## 2012-06-10 ENCOUNTER — Ambulatory Visit: Payer: Self-pay | Admitting: Internal Medicine

## 2012-06-10 LAB — FUNGUS CULTURE W SMEAR: Fungal Smear: NONE SEEN

## 2012-06-12 LAB — FUNGUS CULTURE W SMEAR: Fungal Smear: NONE SEEN

## 2012-06-17 ENCOUNTER — Telehealth: Payer: Self-pay | Admitting: *Deleted

## 2012-06-17 NOTE — Telephone Encounter (Signed)
Great to hear he wants to come back to our clinic

## 2012-06-17 NOTE — Telephone Encounter (Signed)
Appt made for pt w/ Dr. Drue Second for Tues., Sept 24 @ 2:30PM.  [facility] to fax discharge information to [facility] prior to appt.  RN will notify [employer] of the pt's appt.  CD4 = 30.

## 2012-06-18 ENCOUNTER — Telehealth: Payer: Self-pay | Admitting: *Deleted

## 2012-06-18 NOTE — Telephone Encounter (Signed)
Nurse from [facility] called asking if we could do a dressing change on patient's PICC in 7 days as she can not find a Home Health agency due to pt not being insured.  Per Tomasita Morrow, RN we can do that at patient's appt on 06/25/12 with Dr. Drue Second.  [facility] is paying for patient's IV antibiotics, he will need for one of our RNs to also pull his PICC at end of therapy which is 06/28/12.  She is faxing over notes. Wendall Mola CMA

## 2012-06-24 LAB — AFB CULTURE, BLOOD

## 2012-06-25 ENCOUNTER — Encounter: Payer: Self-pay | Admitting: Internal Medicine

## 2012-06-25 ENCOUNTER — Ambulatory Visit (INDEPENDENT_AMBULATORY_CARE_PROVIDER_SITE_OTHER): Payer: Self-pay | Admitting: Internal Medicine

## 2012-06-25 ENCOUNTER — Telehealth: Payer: Self-pay | Admitting: *Deleted

## 2012-06-25 VITALS — BP 143/101 | HR 116 | Temp 98.0°F | Wt 163.0 lb

## 2012-06-25 DIAGNOSIS — D589 Hereditary hemolytic anemia, unspecified: Secondary | ICD-10-CM

## 2012-06-25 DIAGNOSIS — B20 Human immunodeficiency virus [HIV] disease: Secondary | ICD-10-CM

## 2012-06-25 DIAGNOSIS — Z21 Asymptomatic human immunodeficiency virus [HIV] infection status: Secondary | ICD-10-CM

## 2012-06-25 DIAGNOSIS — B252 Cytomegaloviral pancreatitis: Secondary | ICD-10-CM

## 2012-06-25 DIAGNOSIS — K8689 Other specified diseases of pancreas: Secondary | ICD-10-CM

## 2012-06-25 DIAGNOSIS — B259 Cytomegaloviral disease, unspecified: Secondary | ICD-10-CM

## 2012-06-25 DIAGNOSIS — D599 Acquired hemolytic anemia, unspecified: Secondary | ICD-10-CM

## 2012-06-25 LAB — AFB CULTURE, BLOOD

## 2012-06-25 NOTE — Telephone Encounter (Signed)
Returned call to [place] at [facility] in reference to the stop date on the patients PICC. Advised her Dr Drue Second wants the patient to continue until Thursday 06/27/12 and he has an appt with [place] to have it removed at that time. She advised she will make note and that will be his discharge date with them. She advised [place] to call her if we need any further info. [phone]

## 2012-06-25 NOTE — Progress Notes (Signed)
HIV CLINIC NOTE  RFV: follow up from hospitalization, HIV/AIDS Subjective:    Patient ID: Zachary Hill, male    DOB: 11/21/1980, 31 y.o.   MRN: [id number]  HPI Zachary Hill is a 31yo Male with HIV/AIDS, CD 4 count of 10, VL 475K in July 2013, admitted  in early August for fever, abdominal pain,(at [facility]) and quickly developed acute renal failure, acute hypoxemic respiratory failure and severe sepsis of unclear origin. He had a prolonged, complicated hospitalization. Sepsis thought to be due to pancreatitis, s/p drainage of pancreatitic fluid + CMV, and CMV viremia of 778,600. He developed klebseilla bacteremia on 9/11 but cleared by 9/15, treated with cipro x 14 days ended on 9/25. He was also treated for enterobacter cloacae pneumonia. His AFB sputum did isolate mycobacterium ,non-TB thought to be colonizaer. Not treated since AFB blood cultures were negative, and patient improved without treatment.   For his HIV disease, he was started on darunavir 800mg  daily, ritonavir 100mg  daily, lamivudien 150mg  bid, and raltegravir 400mg  bid. He was continued on atovaquone for PCP proph since he has allergy to bactrim, and dapsone thought to cause his anemia. He is also on azithromycin 1200mg  weekly for mac proph.   For CMV viremia, he was initially started on IV ganciclovir on 9/11 for 2 wk course then to decide if need to redose for chronic maintenance. His last dose is in 2 days.  Hospitalized for 7 wks, and 3 days. Released 1 wk ago. Has lost 33 lbs. Intubated for 2 days for pancreatitis.  Current Outpatient Prescriptions on File Prior to Visit  Medication Sig Dispense Refill  . dapsone 100 MG tablet Take 1 tablet (100 mg total) by mouth daily.  30 tablet  0  . darunavir (PREZISTA) 600 MG tablet Take 1 tablet (600 mg total) by mouth 2 (two) times daily with a meal.  60 tablet  5  . emtricitabine-tenofovir (TRUVADA) 200-300 MG per tablet Take 1 tablet by mouth daily.  30 tablet  5  .  fluconazole (DIFLUCAN) 200 MG tablet Take 200 mg by mouth daily.      . ritonavir (NORVIR) 100 MG TABS Take 1 tablet (100 mg total) by mouth daily.  30 tablet  0   Active Ambulatory Problems    Diagnosis Date Noted  . Human immunodeficiency virus (HIV) disease 03/29/2012  . Human papillomavirus in conditions classified elsewhere and of unspecified site 03/29/2012  . Candidal esophagitis 04/29/2012  . Fever 05/11/2012  . Nausea & vomiting 05/11/2012  . Odynophagia 05/13/2012  . Diarrhea 05/14/2012  . Pharyngitis 05/14/2012  . Acute kidney injury 05/14/2012  . Thrombocytopenia 05/14/2012  . 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 05/18/2012  . Acute renal failure   . Anemia   . Dyspnea 05/20/2012   Resolved Ambulatory Problems    Diagnosis Date Noted  . No Resolved Ambulatory Problems   Past Medical History  Diagnosis Date  . HIV disease   . Hepatitis B    History   Social History  . Marital Status: Single    Spouse Name: N/A    Number of Children: N/A  . Years of Education: N/A   Occupational History  . STUDENT    Social History Main Topics  . Smoking status: Current Every Day Smoker -- 0.2 packs/day    Types: Cigarettes  . Smokeless tobacco: Never Used  . Alcohol Use: 1.5 oz/week    3 drink(s) per week     red wine , social   .  Drug Use: No  . Sexually Active: Not Currently -- Male partner(s)   Other Topics Concern  . Not on file   Social History Narrative  . No narrative on file   family history is not on file.  Review of Systems   Constitutional: Negative for fever, chills, diaphoresis, activity change, appetite change, fatigue and still is below his baseline weight from his prolonged hospitalization HENT: Negative for congestion, sore throat, rhinorrhea, sneezing, trouble swallowing and sinus pressure.  Eyes: Negative for photophobia and visual disturbance.  Respiratory: Negative for cough, chest tightness, shortness of breath, wheezing and stridor.    Cardiovascular: Negative for chest pain, palpitations and leg swelling.  Gastrointestinal: Negative for nausea, vomiting, abdominal pain, diarrhea, constipation, blood in stool, abdominal distention and anal bleeding.  Genitourinary: Negative for dysuria, hematuria, flank pain and difficulty urinating.  Musculoskeletal: Negative for myalgias, back pain, joint swelling, arthralgias and gait problem.  Skin: Negative for color change, pallor, rash and wound.  Neurological: Negative for dizziness, tremors, weakness and light-headedness.  Hematological: Negative for adenopathy. Does not bruise/bleed easily.  Psychiatric/Behavioral: not back to his normal self, trying to get strength back due to prolonged hospitalization. He has some transient memory loss when he was intubated and in icu.      Objective:   Physical Exam BP 143/101  Pulse 116  Temp 98 F (36.7 C) (Oral)  Wt 163 lb (73.936 kg) Physical Exam  Constitutional: He is oriented to person, place, and time. He appears well-developed and well-nourished. No distress.  HENT:  Mouth/Throat: Oropharynx is clear and moist. No oropharyngeal exudate.  Cardiovascular: Normal rate, regular rhythm and normal heart sounds. Exam reveals no gallop and no friction rub.  No murmur heard.  Pulmonary/Chest: Effort normal and breath sounds normal. No respiratory distress. He has no wheezes.  Abdominal: Soft. Bowel sounds are normal. He exhibits no distension. There is no tenderness.  Lymphadenopathy:  He has no cervical adenopathy.  Neurological: He is alert and oriented to person, place, and time.  Skin: Skin is warm and dry. No rash noted. No erythema.  Psychiatric: He has a normal mood and affect. His behavior is normal.      Assessment & Plan:  hiv = continue on his current regimen of raltegavir, prezista, norvir, and epivir  cmv disease = 14 day course of Ganciclovir to end in 2 days. His CMV VL was 778,600 on 9/11 (increased from 5,424 on  8/21). We will check cmv viral load, and cbc with diff today. Will prescribe valcyte 900mg  BID until viremia is cleared. Will repeat CMV VL and CBC on weekly basis  Hemolytic anemia =  Will check G6PD to see if can change back to dapsone  OI proph= will continue with mepron for PCP proph, azithromycin Q wk for MAC proph.  rtc in [place] for labs, rtc in 2 days to pull picc,  Health maintenance = give flu vax in 1 wk.   rtc in 4-6 wks

## 2012-06-26 LAB — BASIC METABOLIC PANEL WITH GFR
BUN: 14 mg/dL (ref 6–23)
CO2: 23 mEq/L (ref 19–32)
Calcium: 10 mg/dL (ref 8.4–10.5)
Creat: 0.74 mg/dL (ref 0.50–1.35)
Glucose, Bld: 83 mg/dL (ref 70–99)

## 2012-06-26 LAB — CBC WITH DIFFERENTIAL/PLATELET
Basophils Absolute: 0 10*3/uL (ref 0.0–0.1)
Basophils Relative: 0 % (ref 0–1)
Eosinophils Absolute: 0.1 10*3/uL (ref 0.0–0.7)
Eosinophils Relative: 1 % (ref 0–5)
MCH: 27.1 pg (ref 26.0–34.0)
MCHC: 31.3 g/dL (ref 30.0–36.0)
MCV: 86.8 fL (ref 78.0–100.0)
Platelets: 323 10*3/uL (ref 150–400)
RDW: 17.5 % — ABNORMAL HIGH (ref 11.5–15.5)
WBC: 4.7 10*3/uL (ref 4.0–10.5)

## 2012-06-27 ENCOUNTER — Ambulatory Visit: Payer: Self-pay

## 2012-06-29 ENCOUNTER — Other Ambulatory Visit: Payer: Self-pay | Admitting: Internal Medicine

## 2012-06-29 DIAGNOSIS — B252 Cytomegaloviral pancreatitis: Secondary | ICD-10-CM | POA: Insufficient documentation

## 2012-06-29 DIAGNOSIS — B259 Cytomegaloviral disease, unspecified: Secondary | ICD-10-CM | POA: Insufficient documentation

## 2012-06-29 MED ORDER — VALGANCICLOVIR HCL 450 MG PO TABS: 900.0000 mg | ORAL_TABLET | Freq: Two times a day (BID) | ORAL | Status: DC

## 2012-07-01 ENCOUNTER — Ambulatory Visit (INDEPENDENT_AMBULATORY_CARE_PROVIDER_SITE_OTHER): Payer: Self-pay

## 2012-07-01 ENCOUNTER — Other Ambulatory Visit: Payer: Self-pay

## 2012-07-01 DIAGNOSIS — Z23 Encounter for immunization: Secondary | ICD-10-CM

## 2012-07-01 DIAGNOSIS — B259 Cytomegaloviral disease, unspecified: Secondary | ICD-10-CM

## 2012-07-01 LAB — CBC WITH DIFFERENTIAL/PLATELET
Basophils Relative: 0 % (ref 0–1)
Eosinophils Absolute: 0.1 10*3/uL (ref 0.0–0.7)
Eosinophils Relative: 3 % (ref 0–5)
MCH: 27.6 pg (ref 26.0–34.0)
MCHC: 32 g/dL (ref 30.0–36.0)
MCV: 86.3 fL (ref 78.0–100.0)
Neutrophils Relative %: 59 % (ref 43–77)
Platelets: 481 10*3/uL — ABNORMAL HIGH (ref 150–400)

## 2012-07-02 ENCOUNTER — Other Ambulatory Visit: Payer: Self-pay

## 2012-07-02 LAB — AFB CULTURE WITH SMEAR (NOT AT ARMC)

## 2012-07-08 ENCOUNTER — Other Ambulatory Visit: Payer: Self-pay | Admitting: Internal Medicine

## 2012-07-08 ENCOUNTER — Other Ambulatory Visit (INDEPENDENT_AMBULATORY_CARE_PROVIDER_SITE_OTHER): Payer: Self-pay

## 2012-07-08 DIAGNOSIS — B259 Cytomegaloviral disease, unspecified: Secondary | ICD-10-CM

## 2012-07-09 LAB — CBC WITH DIFFERENTIAL/PLATELET
Basophils Absolute: 0 10*3/uL (ref 0.0–0.1)
Basophils Relative: 0 % (ref 0–1)
Eosinophils Relative: 3 % (ref 0–5)
Lymphocytes Relative: 37 % (ref 12–46)
MCHC: 32.9 g/dL (ref 30.0–36.0)
MCV: 86.3 fL (ref 78.0–100.0)
Monocytes Absolute: 0.5 10*3/uL (ref 0.1–1.0)
Neutro Abs: 2.2 10*3/uL (ref 1.7–7.7)
Platelets: 461 10*3/uL — ABNORMAL HIGH (ref 150–400)
RDW: 18.9 % — ABNORMAL HIGH (ref 11.5–15.5)
WBC: 4.4 10*3/uL (ref 4.0–10.5)

## 2012-07-16 ENCOUNTER — Ambulatory Visit (INDEPENDENT_AMBULATORY_CARE_PROVIDER_SITE_OTHER): Payer: Self-pay | Admitting: Internal Medicine

## 2012-07-16 ENCOUNTER — Encounter: Payer: Self-pay | Admitting: Internal Medicine

## 2012-07-16 VITALS — BP 145/102 | HR 96 | Temp 98.1°F | Wt 178.0 lb

## 2012-07-16 DIAGNOSIS — Z21 Asymptomatic human immunodeficiency virus [HIV] infection status: Secondary | ICD-10-CM

## 2012-07-16 DIAGNOSIS — B20 Human immunodeficiency virus [HIV] disease: Secondary | ICD-10-CM

## 2012-07-16 DIAGNOSIS — B259 Cytomegaloviral disease, unspecified: Secondary | ICD-10-CM

## 2012-07-16 LAB — CBC WITH DIFFERENTIAL/PLATELET
Basophils Absolute: 0 10*3/uL (ref 0.0–0.1)
Basophils Relative: 0 % (ref 0–1)
Eosinophils Absolute: 0.2 10*3/uL (ref 0.0–0.7)
Eosinophils Relative: 4 % (ref 0–5)
Lymphs Abs: 1.4 10*3/uL (ref 0.7–4.0)
MCH: 28.9 pg (ref 26.0–34.0)
MCHC: 33.2 g/dL (ref 30.0–36.0)
MCV: 86.9 fL (ref 78.0–100.0)
Neutrophils Relative %: 55 % (ref 43–77)
Platelets: 323 10*3/uL (ref 150–400)
RBC: 3.74 MIL/uL — ABNORMAL LOW (ref 4.22–5.81)
RDW: 18.4 % — ABNORMAL HIGH (ref 11.5–15.5)

## 2012-07-16 NOTE — Progress Notes (Signed)
HIV CLINIC VISIT  RFV: routine follow up care Subjective:    Patient ID: Zachary Hill, male    DOB: May 16, 1981, 31 y.o.   MRN: [id number]  HPI Ying is a 31yo Male with HIV/AIDS, CD 4 count of 10, VL 475K in July 2013, admitted in early August for fever, abdominal pain,(at [facility]) and quickly developed acute renal failure, acute hypoxemic respiratory failure and severe sepsis of unclear origin. He had a prolonged, complicated hospitalization. Sepsis thought to be due to pancreatitis, s/p drainage of pancreatitic fluid + CMV, and CMV viremia of 778,600. He developed klebseilla bacteremia on 9/11 but cleared by 9/15, treated with cipro x 14 days ended on 9/25. He was also treated for enterobacter cloacae pneumonia. His AFB sputum did isolate mycobacterium ,non-TB thought to be colonizaer. Not treated since AFB blood cultures were negative, and patient improved without treatment.  For his HIV disease, he was started on darunavir 800mg  daily, ritonavir 100mg  daily, lamivudien 150mg  bid, and raltegravir 400mg  bid. He was continued on atovaquone for PCP proph since he has allergy to bactrim, and dapsone thought to cause his anemia. He is also on azithromycin 1200mg  weekly for mac proph.  For CMV viremia, he was initially started on IV ganciclovir on 9/11 for 2 wk course then to decide if need to redose for chronic maintenance  Gaining weight.   Review of Systems No fever, chills, nightsweats. Eating well. No rash    Objective:   Physical Exam BP 145/102  Pulse 96  Temp 98.1 F (36.7 C) (Oral)  Wt 178 lb (80.74 kg) Physical Exam  Constitutional: He is oriented to person, place, and time. He appears well-developed and well-nourished. No distress.  HENT:  Mouth/Throat: Oropharynx is clear and moist. No oropharyngeal exudate.  Cardiovascular: Normal rate, regular rhythm and normal heart sounds. Exam reveals no gallop and no friction rub.  No murmur heard.  Pulmonary/Chest:  Effort normal and breath sounds normal. No respiratory distress. He has no wheezes.  Abdominal: Soft. Bowel sounds are normal. He exhibits no distension. There is no tenderness.  Lymphadenopathy:  He has no cervical adenopathy.  Neurological: He is alert and oriented to person, place, and time.  Skin: Skin is warm and dry. No rash noted. No erythema.  Psychiatric: He has a normal mood and affect. His behavior is normal.      CBC    Component Value Date/Time   WBC 4.4 07/08/2012 1549   RBC 3.35* 07/08/2012 1549   HGB 9.5* 07/08/2012 1549   HCT 28.9* 07/08/2012 1549   PLT 461* 07/08/2012 1549   MCV 86.3 07/08/2012 1549   MCH 28.4 07/08/2012 1549   MCHC 32.9 07/08/2012 1549   RDW 18.9* 07/08/2012 1549   LYMPHSABS 1.6 07/08/2012 1549   MONOABS 0.5 07/08/2012 1549   EOSABS 0.1 07/08/2012 1549   BASOSABS 0.0 07/08/2012 1549        Assessment & Plan:  HIV = continue with current regimen, cd 4 count and hiv vl  PCP proph = will do dapsone, pharm [facility], azithromycin   CMV viremia = most recent viral load < 350. Will check CMV VL today; for now decrease to valcyte daily  Abdominal cramping = gerd, most likely.

## 2012-07-17 LAB — COMPREHENSIVE METABOLIC PANEL
ALT: 20 U/L (ref 0–53)
AST: 20 U/L (ref 0–37)
Alkaline Phosphatase: 77 U/L (ref 39–117)
CO2: 23 mEq/L (ref 19–32)
Creat: 0.85 mg/dL (ref 0.50–1.35)
Sodium: 138 mEq/L (ref 135–145)
Total Bilirubin: 0.4 mg/dL (ref 0.3–1.2)
Total Protein: 7.8 g/dL (ref 6.0–8.3)

## 2012-07-17 LAB — HIV-1 RNA QUANT-NO REFLEX-BLD: HIV 1 RNA Quant: 126 copies/mL — ABNORMAL HIGH (ref <20)

## 2012-07-17 LAB — T-HELPER CELL (CD4) - (RCID CLINIC ONLY): CD4 % Helper T Cell: 4 % — ABNORMAL LOW (ref 33–55)

## 2012-07-27 LAB — CMV (CYTOMEGALOVIRUS) DNA ULTRAQUANT, PCR: CMV DNA Quant: <200 copies/mL (ref <200)

## 2012-07-29 ENCOUNTER — Other Ambulatory Visit: Payer: Self-pay | Admitting: *Deleted

## 2012-07-29 ENCOUNTER — Telehealth: Payer: Self-pay | Admitting: *Deleted

## 2012-07-29 DIAGNOSIS — B20 Human immunodeficiency virus [HIV] disease: Secondary | ICD-10-CM

## 2012-07-29 MED ORDER — DAPSONE 100 MG PO TABS: 100.0000 mg | ORAL_TABLET | Freq: Every day | ORAL | Status: DC

## 2012-07-29 NOTE — Telephone Encounter (Signed)
Patient's medication regimen does not appear to be correct versus MDs last office note, please verify Thanks Wendall Mola

## 2012-07-30 ENCOUNTER — Other Ambulatory Visit: Payer: Self-pay | Admitting: *Deleted

## 2012-07-30 NOTE — Telephone Encounter (Signed)
meds on my note should be correct

## 2012-07-30 NOTE — Telephone Encounter (Signed)
hiv regimen: darunavir 800mg  daily, ritonavir 100mg  daily, lamivudine 150mg  bid, and raltegravir 400mg  bid.   proph: dapsone daily and  azithromycin 1200mg  weekly   CMV proph= valcyte 450mg  daily

## 2012-10-09 ENCOUNTER — Other Ambulatory Visit (INDEPENDENT_AMBULATORY_CARE_PROVIDER_SITE_OTHER): Payer: Medicaid Other

## 2012-10-09 DIAGNOSIS — B20 Human immunodeficiency virus [HIV] disease: Secondary | ICD-10-CM

## 2012-10-09 LAB — CBC WITH DIFFERENTIAL/PLATELET
Basophils Absolute: 0 10*3/uL (ref 0.0–0.1)
Basophils Relative: 0 % (ref 0–1)
Eosinophils Relative: 3 % (ref 0–5)
HCT: 41.6 % (ref 39.0–52.0)
MCHC: 33.9 g/dL (ref 30.0–36.0)
Monocytes Absolute: 0.7 10*3/uL (ref 0.1–1.0)
Neutro Abs: 1.1 10*3/uL — ABNORMAL LOW (ref 1.7–7.7)
Platelets: 177 10*3/uL (ref 150–400)
RDW: 15.1 % (ref 11.5–15.5)
WBC: 4.7 10*3/uL (ref 4.0–10.5)

## 2012-10-10 LAB — COMPLETE METABOLIC PANEL WITH GFR
ALT: 14 U/L (ref 0–53)
AST: 18 U/L (ref 0–37)
Alkaline Phosphatase: 65 U/L (ref 39–117)
BUN: 14 mg/dL (ref 6–23)
Creat: 1.13 mg/dL (ref 0.50–1.35)
Potassium: 4.3 mEq/L (ref 3.5–5.3)

## 2012-10-10 LAB — T-HELPER CELL (CD4) - (RCID CLINIC ONLY)
CD4 % Helper T Cell: 7 % — ABNORMAL LOW (ref 33–55)
CD4 T Cell Abs: 180 uL — ABNORMAL LOW (ref 400–2700)

## 2012-10-11 LAB — HIV-1 RNA QUANT-NO REFLEX-BLD: HIV-1 RNA Quant, Log: <1.3 {Log} (ref <1.30)

## 2012-10-23 ENCOUNTER — Ambulatory Visit: Payer: Self-pay | Admitting: Internal Medicine

## 2012-10-28 ENCOUNTER — Encounter: Payer: Self-pay | Admitting: Internal Medicine

## 2012-10-28 ENCOUNTER — Ambulatory Visit (INDEPENDENT_AMBULATORY_CARE_PROVIDER_SITE_OTHER): Payer: Medicaid Other | Admitting: Internal Medicine

## 2012-10-28 ENCOUNTER — Ambulatory Visit: Payer: Self-pay

## 2012-10-28 VITALS — BP 142/92 | HR 73 | Temp 98.6°F | Ht 70.0 in | Wt 197.0 lb

## 2012-10-28 DIAGNOSIS — J111 Influenza due to unidentified influenza virus with other respiratory manifestations: Secondary | ICD-10-CM

## 2012-10-28 MED ORDER — OSELTAMIVIR PHOSPHATE 75 MG PO CAPS
75.0000 mg | ORAL_CAPSULE | Freq: Two times a day (BID) | ORAL | Status: AC
Start: 2012-10-28 — End: 2012-11-07

## 2012-10-28 NOTE — Progress Notes (Signed)
[facility] HIV CLINIC NOTE   RFV: routine follow up Subjective:    Patient ID: Zachary Hill, male    DOB: Feb 02, 1981, 32 y.o.   MRN: [id number]  HPI  Zachary Hill is a 32yo Male with HIV/AIDS, CD 4 count  Of 180(7%)/ VL <20, currently on RAL/Lamivudine/ DRVr (nadir of of 10, VL 475K in July 2013), hx of CMV pancreatitis, , and CMV viremia of 778,600. As well as klebseilla bacteremia; on OI proph of azithro ( which he takes on Saturday)  and dapsone for PCP prophylaxis.   Feeling poorly for the last 4 days, missed last night's dose (only 1 in the last 3 months). Cough and sore throat and achiness/myalgias.   Current Outpatient Prescriptions on File Prior to Visit  Medication Sig Dispense Refill  . dapsone 100 MG tablet Take 1 tablet (100 mg total) by mouth daily.  30 tablet  6  . Darunavir Ethanolate (PREZISTA) 800 MG tablet Take 800 mg by mouth daily.      Marland Kitchen lamiVUDine (EPIVIR) 150 MG tablet Take 150 mg by mouth 2 (two) times daily.      . raltegravir (ISENTRESS) 400 MG tablet Take 400 mg by mouth 2 (two) times daily.      . ritonavir (NORVIR) 100 MG TABS Take 1 tablet (100 mg total) by mouth daily.  30 tablet  0   Active Ambulatory Problems    Diagnosis Date Noted  . Human immunodeficiency virus (HIV) disease 03/29/2012  . Human papillomavirus in conditions classified elsewhere and of unspecified site 03/29/2012  . Candidal esophagitis 04/29/2012  . Fever 05/11/2012  . Nausea & vomiting 05/11/2012  . Odynophagia 05/13/2012  . Diarrhea 05/14/2012  . Pharyngitis 05/14/2012  . Acute kidney injury 05/14/2012  . Thrombocytopenia 05/14/2012  . 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 05/18/2012  . Acute renal failure   . Anemia   . Dyspnea 05/20/2012  . CMV disease 06/29/2012  . 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 No Resolved Ambulatory Problems   Past Medical History  Diagnosis Date  . HIV disease   . Hepatitis B      Review of  Systems Otherwise eating well. Positive pertinents of 10 point ROS listed in HPI    Objective:   Physical Exam BP 142/92  Pulse 73  Temp 98.6 F (37 C) (Oral)  Ht 5\' 10"  (1.778 m)  Wt 197 lb (89.359 kg)  BMI 28.27 kg/m2 Physical Exam  Constitutional: He is oriented to person, place, and time. He appears well-developed and well-nourished. No distress.  HENT:  Mouth/Throat: Oropharynx is clear and moist. No oropharyngeal exudate.  Cardiovascular: Normal rate, regular rhythm and normal heart sounds. Exam reveals no gallop and no friction rub.  No murmur heard.  Pulmonary/Chest: Effort normal and breath sounds normal. No respiratory distress. He has no wheezes.  Abdominal: Soft. Bowel sounds are normal. He exhibits no distension. There is no tenderness.  Lymphadenopathy:  He has no cervical adenopathy.  Neurological: He is alert and oriented to person, place, and time.  Skin: Skin is warm and dry. No rash noted. No erythema.  Psychiatric: He has a normal mood and affect. His behavior is normal.         Assessment & Plan:  ILI = will give rx for tamiflu. Can also try otc tylenol pm  HIV = continue on current regimen  OI proph = continue with azithromycin will stop in April, continue with dapsone daily

## 2012-11-05 ENCOUNTER — Ambulatory Visit: Payer: Medicaid Other

## 2012-12-27 ENCOUNTER — Other Ambulatory Visit: Payer: Self-pay | Admitting: *Deleted

## 2012-12-27 DIAGNOSIS — B20 Human immunodeficiency virus [HIV] disease: Secondary | ICD-10-CM

## 2012-12-27 MED ORDER — AZITHROMYCIN 600 MG PO TABS: 1200.0000 mg | ORAL_TABLET | ORAL | Status: DC

## 2012-12-30 ENCOUNTER — Other Ambulatory Visit: Payer: Self-pay | Admitting: *Deleted

## 2012-12-30 DIAGNOSIS — B20 Human immunodeficiency virus [HIV] disease: Secondary | ICD-10-CM

## 2012-12-30 MED ORDER — AZITHROMYCIN 600 MG PO TABS: 1200.0000 mg | ORAL_TABLET | ORAL | Status: AC

## 2012-12-30 NOTE — Progress Notes (Signed)
Previous reorder was printed erroneously.

## 2013-01-08 ENCOUNTER — Encounter: Payer: Self-pay | Admitting: *Deleted

## 2013-01-23 ENCOUNTER — Ambulatory Visit: Payer: Medicaid Other | Admitting: Internal Medicine

## 2013-02-10 ENCOUNTER — Ambulatory Visit: Payer: Medicaid Other

## 2013-02-10 ENCOUNTER — Ambulatory Visit: Payer: Medicaid Other | Admitting: Internal Medicine

## 2013-02-17 ENCOUNTER — Ambulatory Visit (INDEPENDENT_AMBULATORY_CARE_PROVIDER_SITE_OTHER): Payer: Medicaid Other | Admitting: Internal Medicine

## 2013-02-17 ENCOUNTER — Ambulatory Visit: Payer: Medicaid Other

## 2013-02-17 VITALS — BP 134/84 | HR 78 | Temp 97.4°F | Wt 201.0 lb

## 2013-02-17 DIAGNOSIS — B20 Human immunodeficiency virus [HIV] disease: Secondary | ICD-10-CM

## 2013-02-17 LAB — COMPLETE METABOLIC PANEL WITH GFR
ALT: 36 U/L (ref 0–53)
AST: 50 U/L — ABNORMAL HIGH (ref 0–37)
Albumin: 4.9 g/dL (ref 3.5–5.2)
CO2: 28 mEq/L (ref 19–32)
Calcium: 9.8 mg/dL (ref 8.4–10.5)
Chloride: 105 mEq/L (ref 96–112)
Creat: 1 mg/dL (ref 0.50–1.35)
GFR, Est African American: >89 mL/min
Potassium: 4.2 mEq/L (ref 3.5–5.3)
Total Protein: 7.6 g/dL (ref 6.0–8.3)

## 2013-02-17 LAB — CBC WITH DIFFERENTIAL/PLATELET
Basophils Absolute: 0 10*3/uL (ref 0.0–0.1)
Eosinophils Relative: 3 % (ref 0–5)
HCT: 39 % (ref 39.0–52.0)
Lymphocytes Relative: 58 % — ABNORMAL HIGH (ref 12–46)
Lymphs Abs: 3.6 10*3/uL (ref 0.7–4.0)
MCV: 84.8 fL (ref 78.0–100.0)
Neutro Abs: 1.7 10*3/uL (ref 1.7–7.7)
Platelets: 215 10*3/uL (ref 150–400)
RBC: 4.6 MIL/uL (ref 4.22–5.81)
WBC: 6.2 10*3/uL (ref 4.0–10.5)

## 2013-02-17 NOTE — Progress Notes (Signed)
[facility] HIV CLINIC NOTE  RFV: routine follow up, last seen jan 2014  Subjective:    Patient ID: Zachary Hill, male    DOB: 10-10-1980, 32 y.o.   MRN: [id number]  HPI  31yo Male, CD 4 count of 180, VL 180, curently on RAL/Lamivudine/ DRVr (nadir of of 180, VL 180 in July 2013 on dapsone and azithromycin.   Current Outpatient Prescriptions on File Prior to Visit  Medication Sig Dispense Refill  . dapsone 100 MG tablet Take 1 tablet (100 mg total) by mouth daily.  30 tablet  6  . Darunavir Ethanolate (PREZISTA) 800 MG tablet Take 800 mg by mouth daily.      Marland Kitchen lamiVUDine (EPIVIR) 150 MG tablet Take 150 mg by mouth 2 (two) times daily.      . raltegravir (ISENTRESS) 400 MG tablet Take 400 mg by mouth 2 (two) times daily.      . ritonavir (NORVIR) 100 MG TABS Take 1 tablet (100 mg total) by mouth daily.  30 tablet  0   No current facility-administered medications on file prior to visit.   Social hx: cosmetology school full-time; active in church. Dating not seriously. Going on cruise to the [place] with group of 7.  Family hx: unchanged  Review of Systems  Constitutional: Negative for fever, chills, diaphoresis, activity change, appetite change, fatigue and unexpected weight change.  HENT: Negative for congestion, sore throat, rhinorrhea, sneezing, trouble swallowing and sinus pressure.  Eyes: Negative for photophobia and visual disturbance.  Respiratory: Negative for cough, chest tightness, shortness of breath, wheezing and stridor.  Cardiovascular: Negative for chest pain, palpitations and leg swelling.  Gastrointestinal: Negative for nausea, vomiting, abdominal pain, diarrhea, constipation, blood in stool, abdominal distention and anal bleeding.  Genitourinary: Negative for dysuria, hematuria, flank pain and difficulty urinating.  Musculoskeletal: Negative for myalgias, back pain, joint swelling, arthralgias and gait problem.  Skin: Negative for color change, pallor, rash and wound.   Neurological: Negative for dizziness, tremors, weakness and light-headedness.  Hematological: Negative for adenopathy. Does not bruise/bleed easily.  Psychiatric/Behavioral: Negative for behavioral problems, confusion, sleep disturbance, dysphoric mood, decreased concentration and agitation.       Objective:   Physical Exam BP 134/84  Pulse 78  Temp(Src) 97.4 F (36.3 C) (Oral)  Wt 201 lb (91.173 kg)  BMI 28.84 kg/m2 Physical Exam  Constitutional: He is oriented to person, place, and time. He appears well-developed and well-nourished. No distress.  HENT: left upper molar broken at gum line Mouth/Throat: Oropharynx is clear and moist. No oropharyngeal exudate.  Cardiovascular: Normal rate, regular rhythm and normal heart sounds. Exam reveals no gallop and no friction rub.  No murmur heard.  Pulmonary/Chest: Effort normal and breath sounds normal. No respiratory distress. He has no wheezes.  Abdominal: Soft. Bowel sounds are normal. He exhibits no distension. There is no tenderness.  Lymphadenopathy:  He has no cervical adenopathy.  Neurological: He is alert and oriented to person, place, and time.  Skin: Skin is warm and dry. No rash noted. No erythema.  Psychiatric: He has a normal mood and affect. His behavior is normal.       Assessment & Plan:  HIV =  Continue with current regimen  oi prophylaxis = continue with dapsone alone. Can stop azithromycin  Dental caries = upper left molar looks suspect need for extraction, refer to dental. No tenderness at this time. Will not give abtx for now

## 2013-02-18 ENCOUNTER — Other Ambulatory Visit: Payer: Self-pay | Admitting: *Deleted

## 2013-02-18 ENCOUNTER — Telehealth: Payer: Self-pay

## 2013-02-18 DIAGNOSIS — B20 Human immunodeficiency virus [HIV] disease: Secondary | ICD-10-CM

## 2013-02-18 MED ORDER — DARUNAVIR ETHANOLATE 800 MG PO TABS: 800.0000 mg | ORAL_TABLET | Freq: Every day | ORAL | Status: DC

## 2013-02-18 MED ORDER — LAMIVUDINE 150 MG PO TABS: 150.0000 mg | ORAL_TABLET | Freq: Two times a day (BID) | ORAL | Status: DC

## 2013-02-18 MED ORDER — RALTEGRAVIR POTASSIUM 400 MG PO TABS: 400.0000 mg | ORAL_TABLET | Freq: Two times a day (BID) | ORAL | Status: DC

## 2013-02-18 NOTE — Telephone Encounter (Signed)
Printed to go with ADAP application.

## 2013-02-18 NOTE — Telephone Encounter (Deleted)
Patient came in yesterday to renew RW/ADAP - he stated initially he had no income and is going to cosmetology school full time , then stated while he was here that he was getting disablity that started last month - told him that counted as income and needed the letter showing the amount - seemed to be resistant as he stated: "I didn't know to bring it since you didn't ask for it" I told him that I didn't ask as he stated he had no income at all - asked him to fax the SSD letter to [phone], said he would, but also wanted a list of what "exactly he needed to bring, so we would understand each other",  called this morning to remind him and left a vm. Will not be able to send the application to ADAP office without the SSD info.

## 2013-02-19 LAB — HIV-1 RNA QUANT-NO REFLEX-BLD: HIV-1 RNA Quant, Log: <1.3 {Log} (ref <1.30)

## 2013-05-06 ENCOUNTER — Other Ambulatory Visit: Payer: Self-pay

## 2013-05-20 ENCOUNTER — Ambulatory Visit (INDEPENDENT_AMBULATORY_CARE_PROVIDER_SITE_OTHER): Payer: Medicaid Other | Admitting: Internal Medicine

## 2013-05-20 ENCOUNTER — Encounter: Payer: Self-pay | Admitting: Internal Medicine

## 2013-05-20 VITALS — BP 153/95 | HR 84 | Temp 97.1°F | Wt 203.0 lb

## 2013-05-20 DIAGNOSIS — B20 Human immunodeficiency virus [HIV] disease: Secondary | ICD-10-CM

## 2013-05-20 DIAGNOSIS — Z21 Asymptomatic human immunodeficiency virus [HIV] infection status: Secondary | ICD-10-CM

## 2013-05-20 MED ORDER — DARUNAVIR ETHANOLATE 800 MG PO TABS: 800.0000 mg | ORAL_TABLET | Freq: Every day | ORAL | Status: DC

## 2013-05-20 MED ORDER — RALTEGRAVIR POTASSIUM 400 MG PO TABS: 400.0000 mg | ORAL_TABLET | Freq: Two times a day (BID) | ORAL | Status: DC

## 2013-05-20 MED ORDER — EMTRICITABINE-TENOFOVIR DF 200-300 MG PO TABS: 1.0000 | ORAL_TABLET | Freq: Every day | ORAL | Status: DC

## 2013-05-20 MED ORDER — RITONAVIR 100 MG PO TABS: 100.0000 mg | ORAL_TABLET | Freq: Every day | ORAL | Status: DC

## 2013-05-20 MED ORDER — DAPSONE 100 MG PO TABS: 100.0000 mg | ORAL_TABLET | Freq: Every day | ORAL | Status: DC

## 2013-05-20 NOTE — Progress Notes (Signed)
[facility] HIV CLINIC NOTE  RFV: routine  Subjective:    Patient ID: Zachary Hill, male    DOB: Jan 04, 1981, 32 y.o.   MRN: [id number]  HPI Zachary Hill, Zachary Hill with HIV cb/ eso candidiasis, CMV pancreatitis, CD 4 count of 360/VL <20, on RAL/DRVr/lamivudine and Oi proph of dapsone. He states that his insurance ran out thus towards the end of the regimen, spreading it to once a day dosing.  Last seen/last labs in May 2014. He reports being off of meds for 6-8 wks, due to inability to pay for co-payment. He reports numerous stressors with relationships, church.  Current Outpatient Prescriptions on File Prior to Visit  Medication Sig Dispense Refill  . azithromycin (ZITHROMAX) 600 MG tablet Take 1,200 mg by mouth every 7 (seven) days.      . dapsone 100 MG tablet Take 1 tablet (100 mg total) by mouth daily.  30 tablet  6  . Darunavir Ethanolate (PREZISTA) 800 MG tablet Take 1 tablet (800 mg total) by mouth daily.  30 tablet  6  . lamiVUDine (EPIVIR) 150 MG tablet Take 1 tablet (150 mg total) by mouth 2 (two) times daily.  60 tablet  6  . raltegravir (ISENTRESS) 400 MG tablet Take 1 tablet (400 mg total) by mouth 2 (two) times daily.  60 tablet  6  . ritonavir (NORVIR) 100 MG TABS Take 1 tablet (100 mg total) by mouth daily.  30 tablet  0   No current facility-administered medications on file prior to visit.   Active Ambulatory Problems    Diagnosis Date Noted  . Human immunodeficiency virus (HIV) disease 03/29/2012  . Human papillomavirus in conditions classified elsewhere and of unspecified site 03/29/2012  . Candidal esophagitis 04/29/2012  . Fever 05/11/2012  . Nausea & vomiting 05/11/2012  . Odynophagia 05/13/2012  . Diarrhea 05/14/2012  . Pharyngitis 05/14/2012  . Acute kidney injury 05/14/2012  . Thrombocytopenia 05/14/2012  . 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 05/18/2012  . Acute renal failure   . Anemia   . Dyspnea 05/20/2012  . CMV disease 06/29/2012  . 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 No Resolved Ambulatory Problems   Past Medical History  Diagnosis Date  . HIV disease   . Hepatitis B       Review of Systems     Objective:   Physical Exam BP 153/95  Pulse 84  Temp(Src) 97.1 F (36.2 C) (Oral)  Wt 203 lb (92.08 kg)  BMI 29.13 kg/m2 Physical Exam  Constitutional: He is oriented to person, place, and time. He appears well-developed and well-nourished. No distress.  HENT:  Mouth/Throat: Oropharynx is clear and moist. No oropharyngeal exudate.  Cardiovascular: Normal rate, regular rhythm and normal heart sounds. Exam reveals no gallop and no friction rub.  No murmur heard.  Pulmonary/Chest: Effort normal and breath sounds normal. No respiratory distress. He has no wheezes.  Abdominal: Soft. Bowel sounds are normal. He exhibits no distension. There is no tenderness.  Lymphadenopathy:  He has no cervical adenopathy.  Neurological: He is alert and oriented to person, place, and time.  Skin: Skin is warm and dry. No rash noted. No erythema.  Psychiatric: tearful during visit, appears sad.          Assessment & Plan:  hiv = off of therapy. Concern for II mutation since he was taking raltegravir once a day. We will start back on RLG BID/DRVr/truvada . D/c lamivudine. Will have  him come back in 2 wk to check bmp and cd 4 count.  oi proph = will do dapsone  Acute depression = emotional labile, interested in meeting kenny. No self harm  Financial constraint = will send [facility]  rtc in 2 wks.

## 2013-06-03 ENCOUNTER — Ambulatory Visit (INDEPENDENT_AMBULATORY_CARE_PROVIDER_SITE_OTHER): Payer: Medicaid Other | Admitting: Internal Medicine

## 2013-06-03 ENCOUNTER — Encounter: Payer: Self-pay | Admitting: Internal Medicine

## 2013-06-03 VITALS — BP 151/97 | HR 87 | Temp 98.0°F | Wt 199.0 lb

## 2013-06-03 DIAGNOSIS — Z23 Encounter for immunization: Secondary | ICD-10-CM

## 2013-06-03 NOTE — Progress Notes (Signed)
  [facility] - Pharmacist    HPI: Zachary Hill is a 32 y.o. male here for follow-up after restarting his medications.  Allergies: Allergies  Allergen Reactions  . Fruit & Vegetable Daily [Nutritional Supplements]     Allergic to RAW fruits and vegetables .  Marland Kitchen Bactrim [Sulfamethoxazole W-Trimethoprim] Itching, Rash and Other (See Comments)    "flu like symptoms"    Vitals: Temp: 98 F (36.7 C) (09/02 1030) Temp src: Oral (09/02 1030) BP: 151/97 mmHg (09/02 1030) Pulse Rate: 87 (09/02 1030)  Past Medical History: Past Medical History  Diagnosis Date  . HIV disease     CD4 = 10, VL = 475K, 03/21/12.  Medication non-compliance  . Hepatitis B     03/21/12 - sAg neg, sAb pos, cAb pos, indication prior infection  . Acute renal failure   . Anemia     Social History: History   Social History  . Marital Status: Single    Spouse Name: N/A    Number of Children: N/A  . Years of Education: N/A   Occupational History  . STUDENT    Social History Main Topics  . Smoking status: Current Some Day Smoker -- 0.20 packs/day    Types: Cigarettes  . Smokeless tobacco: Never Used     Comment: smokes when stressed  . Alcohol Use: 1.5 oz/week    3 drink(s) per week     Comment: red wine , social   . Drug Use: No  . Sexual Activity: Not Currently    Partners: Male   Other Topics Concern  . None   Social History Narrative  . None    Current Regimen: Raltegravir, Darunavir/ritonavir, Truvada  Labs: HIV 1 RNA Quant (copies/mL)  Date Value  02/17/2013 <20   10/09/2012 <20   07/16/2012 126*     CD4 T Cell Abs (cmm)  Date Value  02/17/2013 360*  10/09/2012 180*  07/16/2012 60*     Hep B S Ab (no units)  Date Value  03/21/2012 POS*     Hepatitis B Surface Ag (no units)  Date Value  03/21/2012 NEGATIVE      HCV Ab (no units)  Date Value  03/21/2012 NEGATIVE     CrCl: The CrCl is unknown because both a height and weight (above a  minimum accepted value) are required for this calculation.  Lipids:    Component Value Date/Time   CHOL 141 05/12/2012 1932   TRIG 217* 05/12/2012 1932   HDL 13* 05/12/2012 1932   CHOLHDL 10.8 05/12/2012 1932   VLDL 43* 05/12/2012 1932   LDLCALC 85 05/12/2012 1932    Assessment: Adherence - he reports taking his medications as prescribed, and with no missed doses in the past 2 weeks.  As he was inconsistent with his medications and was taking raltegravir only once daily for a period of time resistance remains a concern.  Recommendations: Adherence counseling performed with good response.  Adherence was praised and encouraged.  Pill box was offered and declined.  Recheck CD4 and viral load in 4 weeks. If viral load is high enough for genotype testing would also check for integrase resistance.  Sallee Provencal, Pharm.D., BCPS, AAHIVP Clinical Infectious Disease Pharmacist [facility] 06/03/2013, 11:38 AM

## 2013-06-03 NOTE — Progress Notes (Signed)
[facility] NOTE  RFV: follow up at 2 wk of restarting meds Subjective:    Patient ID: Zachary Hill, male    DOB: 01-14-1981, 32 y.o.   MRN: [id number]  HPI Zachary Hill, Zachary Hill with HIV cb/ eso candidiasis, CMV pancreatitis, CD 4 count of 360/VL <20 (may 2014), on RAL/DRVr/lamivudine and Oi proph of dapsone. Towards the end of the regimen, spreading it to once a day dosing.  He reports being off of meds for 6-8 wks, due to inability to pay for co-payment. We saw him on 8/19 and changed him to  RLG/DRVr/truvada.  Here for 2 wk visit  Current Outpatient Prescriptions on File Prior to Visit  Medication Sig Dispense Refill  . dapsone 100 MG tablet Take 1 tablet (100 mg total) by mouth daily.  30 tablet  6  . Darunavir Ethanolate (PREZISTA) 800 MG tablet Take 1 tablet (800 mg total) by mouth daily.  30 tablet  6  . emtricitabine-tenofovir (TRUVADA) 200-300 MG per tablet Take 1 tablet by mouth daily.  30 tablet  11  . raltegravir (ISENTRESS) 400 MG tablet Take 1 tablet (400 mg total) by mouth 2 (two) times daily.  60 tablet  6  . ritonavir (NORVIR) 100 MG TABS tablet Take 1 tablet (100 mg total) by mouth daily.  30 tablet  0   No current facility-administered medications on file prior to visit.   Active Ambulatory Problems    Diagnosis Date Noted  . Human immunodeficiency virus (HIV) disease 03/29/2012  . Human papillomavirus in conditions classified elsewhere and of unspecified site 03/29/2012  . Candidal esophagitis 04/29/2012  . Fever 05/11/2012  . Nausea & vomiting 05/11/2012  . Odynophagia 05/13/2012  . Diarrhea 05/14/2012  . Pharyngitis 05/14/2012  . Acute kidney injury 05/14/2012  . Thrombocytopenia 05/14/2012  . 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 05/18/2012  . Acute renal failure   . Anemia   . Dyspnea 05/20/2012  . CMV disease 06/29/2012  . 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 No Resolved Ambulatory Problems   Past Medical  History  Diagnosis Date  . HIV disease   . Hepatitis B     Soc hx: going on [place] trip for 5 days    Review of Systems 12 point ROS negative; situational depression improved     Objective:   Physical Exam  BP 151/97  Pulse 87  Temp(Src) 98 F (36.7 C) (Oral)  Wt 199 lb (90.266 kg)  BMI 28.55 kg/m2 Physical Exam  Constitutional: He is oriented to person, place, and time. He appears well-developed and well-nourished. No distress.  HENT:  Mouth/Throat: Oropharynx is clear and moist. No oropharyngeal exudate.  Cardiovascular: Normal rate, regular rhythm and normal heart sounds. Exam reveals no gallop and no friction rub.  No murmur heard.  Pulmonary/Chest: Effort normal and breath sounds normal. No respiratory distress. He has no wheezes.  Abdominal: Soft. Bowel sounds are normal. He exhibits no distension. There is no tenderness.  Lymphadenopathy:  He has no cervical adenopathy.  Neurological: He is alert and oriented to person, place, and time.  Skin: Skin is warm and dry. No rash noted. No erythema.  Psychiatric: He has a normal mood and affect. His behavior is normal.         Assessment & Plan:   HIV = will get CD 4 count, VL and BMP in 4-5 wk to see if any changes due to starting truvada. Also check CD  4 count to see if need to reinitiated dapsone. Anticipate still having viremia and get   Health maintenance = flu shot and hep A vaccine #1 today; hep A #2 in 6 months  Adherence = doing well in the last 10-12 days  Depression = seek counseling with Zachary Hill, will make appt today  rtc in 6 wks and labs in 4 wks

## 2013-06-17 ENCOUNTER — Other Ambulatory Visit: Payer: Self-pay | Admitting: Internal Medicine

## 2013-07-01 ENCOUNTER — Other Ambulatory Visit (INDEPENDENT_AMBULATORY_CARE_PROVIDER_SITE_OTHER): Payer: Medicaid Other

## 2013-07-01 ENCOUNTER — Ambulatory Visit: Payer: Medicaid Other

## 2013-07-01 ENCOUNTER — Other Ambulatory Visit: Payer: Self-pay | Admitting: *Deleted

## 2013-07-01 DIAGNOSIS — F4320 Adjustment disorder, unspecified: Secondary | ICD-10-CM

## 2013-07-01 DIAGNOSIS — Z79899 Other long term (current) drug therapy: Secondary | ICD-10-CM

## 2013-07-01 DIAGNOSIS — B20 Human immunodeficiency virus [HIV] disease: Secondary | ICD-10-CM

## 2013-07-01 DIAGNOSIS — Z113 Encounter for screening for infections with a predominantly sexual mode of transmission: Secondary | ICD-10-CM

## 2013-07-01 LAB — CBC WITH DIFFERENTIAL/PLATELET
Basophils Absolute: 0 10*3/uL (ref 0.0–0.1)
Basophils Relative: 1 % (ref 0–1)
Eosinophils Absolute: 0.2 10*3/uL (ref 0.0–0.7)
Eosinophils Relative: 2 % (ref 0–5)
HCT: 35.4 % — ABNORMAL LOW (ref 39.0–52.0)
Hemoglobin: 11.9 g/dL — ABNORMAL LOW (ref 13.0–17.0)
Lymphocytes Relative: 41 % (ref 12–46)
Lymphs Abs: 3.1 10*3/uL (ref 0.7–4.0)
MCH: 27.1 pg (ref 26.0–34.0)
MCHC: 33.6 g/dL (ref 30.0–36.0)
MCV: 80.6 fL (ref 78.0–100.0)
Monocytes Absolute: 1.1 10*3/uL — ABNORMAL HIGH (ref 0.1–1.0)
Monocytes Relative: 14 % — ABNORMAL HIGH (ref 3–12)
Neutro Abs: 3.2 10*3/uL (ref 1.7–7.7)
Neutrophils Relative %: 42 % — ABNORMAL LOW (ref 43–77)
Platelets: 189 10*3/uL (ref 150–400)
RBC: 4.39 MIL/uL (ref 4.22–5.81)
RDW: 14.7 % (ref 11.5–15.5)
WBC: 7.6 10*3/uL (ref 4.0–10.5)

## 2013-07-01 LAB — COMPLETE METABOLIC PANEL WITH GFR
ALT: 21 U/L (ref 0–53)
AST: 22 U/L (ref 0–37)
Albumin: 4.4 g/dL (ref 3.5–5.2)
Alkaline Phosphatase: 94 U/L (ref 39–117)
BUN: 17 mg/dL (ref 6–23)
GFR, Est Non African American: 88 mL/min
Glucose, Bld: 103 mg/dL — ABNORMAL HIGH (ref 70–99)
Potassium: 4.2 mEq/L (ref 3.5–5.3)
Sodium: 141 mEq/L (ref 135–145)
Total Bilirubin: 0.8 mg/dL (ref 0.3–1.2)
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
## 2013-07-02 ENCOUNTER — Other Ambulatory Visit: Payer: Medicaid Other

## 2013-07-02 LAB — T-HELPER CELL (CD4) - (RCID CLINIC ONLY): CD4 T Cell Abs: 270 /uL — ABNORMAL LOW (ref 400–2700)

## 2013-07-03 LAB — HIV-1 RNA QUANT-NO REFLEX-BLD: HIV-1 RNA Quant, Log: 2.32 {Log} — ABNORMAL HIGH (ref <1.30)

## 2013-07-21 ENCOUNTER — Ambulatory Visit: Payer: Medicaid Other | Admitting: Internal Medicine

## 2013-07-21 ENCOUNTER — Telehealth: Payer: Self-pay | Admitting: *Deleted

## 2013-07-21 NOTE — Telephone Encounter (Signed)
Called patient to try and reschedule his missed appt and he advised just started school and is a little overwhelmed and forgot. Advised he has some appts coming up with Bernette Redbird and I can make his appt for the same day if that would help and he advised it may. He advised the 11/18 appt he has a day off so I made his appt with Dr Drue Second for 08/19/13 at 1045 am.

## 2013-08-05 ENCOUNTER — Ambulatory Visit: Payer: Medicaid Other

## 2013-08-19 ENCOUNTER — Ambulatory Visit: Payer: Medicaid Other | Admitting: Internal Medicine

## 2013-08-19 ENCOUNTER — Ambulatory Visit: Payer: Medicaid Other

## 2013-08-22 ENCOUNTER — Telehealth: Payer: Self-pay | Admitting: *Deleted

## 2013-08-22 NOTE — Telephone Encounter (Signed)
Called patient about No Show appt this week and he advised he is in class and cannot miss anymore days but that as soon as he finished he will call and make an appt. Advised him if he knows when classes are over he should make an appt now so that there is an appt available to him when he is able to come. He advised he will check on the end date and call back to schedule as soon as he knows.

## 2013-09-30 ENCOUNTER — Telehealth: Payer: Self-pay | Admitting: *Deleted

## 2013-09-30 NOTE — Telephone Encounter (Signed)
Called patient and scheduled his follow up appt with Dr Drue Second for 11/06/13 and advised him to come for labs prior but he will need to check his schedule and call us back.

## 2013-11-06 ENCOUNTER — Ambulatory Visit (INDEPENDENT_AMBULATORY_CARE_PROVIDER_SITE_OTHER): Payer: Medicaid Other | Admitting: Internal Medicine

## 2013-11-06 ENCOUNTER — Encounter: Payer: Self-pay | Admitting: Internal Medicine

## 2013-11-06 VITALS — BP 138/92 | HR 71 | Temp 98.2°F | Wt 208.0 lb

## 2013-11-06 DIAGNOSIS — B20 Human immunodeficiency virus [HIV] disease: Secondary | ICD-10-CM

## 2013-11-06 LAB — CBC WITH DIFFERENTIAL/PLATELET
Basophils Absolute: 0 10*3/uL (ref 0.0–0.1)
Basophils Relative: 0 % (ref 0–1)
EOS PCT: 3 % (ref 0–5)
Eosinophils Absolute: 0.2 10*3/uL (ref 0.0–0.7)
HCT: 43.2 % (ref 39.0–52.0)
HEMOGLOBIN: 14.4 g/dL (ref 13.0–17.0)
Lymphocytes Relative: 54 % — ABNORMAL HIGH (ref 12–46)
Lymphs Abs: 3 10*3/uL (ref 0.7–4.0)
MCH: 27.4 pg (ref 26.0–34.0)
MCHC: 33.3 g/dL (ref 30.0–36.0)
MCV: 82.1 fL (ref 78.0–100.0)
MONOS PCT: 11 % (ref 3–12)
Monocytes Absolute: 0.6 10*3/uL (ref 0.1–1.0)
NEUTROS ABS: 1.8 10*3/uL (ref 1.7–7.7)
Neutrophils Relative %: 32 % — ABNORMAL LOW (ref 43–77)
Platelets: 214 10*3/uL (ref 150–400)
RBC: 5.26 MIL/uL (ref 4.22–5.81)
RDW: 14.5 % (ref 11.5–15.5)
WBC: 5.6 10*3/uL (ref 4.0–10.5)

## 2013-11-06 LAB — LIPID PANEL
Cholesterol: 228 mg/dL — ABNORMAL HIGH (ref 0–200)
HDL: 28 mg/dL — ABNORMAL LOW (ref >39)
TRIGLYCERIDES: 652 mg/dL — AB (ref <150)
Total CHOL/HDL Ratio: 8.1 Ratio

## 2013-11-06 LAB — COMPLETE METABOLIC PANEL WITH GFR
ALT: 16 U/L (ref 0–53)
AST: 17 U/L (ref 0–37)
Albumin: 4.3 g/dL (ref 3.5–5.2)
Alkaline Phosphatase: 91 U/L (ref 39–117)
BILIRUBIN TOTAL: 0.4 mg/dL (ref 0.2–1.2)
BUN: 18 mg/dL (ref 6–23)
CHLORIDE: 104 meq/L (ref 96–112)
CO2: 27 meq/L (ref 19–32)
Calcium: 9.7 mg/dL (ref 8.4–10.5)
Creat: 0.98 mg/dL (ref 0.50–1.35)
GFR, Est Non African American: >89 mL/min
Glucose, Bld: 136 mg/dL — ABNORMAL HIGH (ref 70–99)
Potassium: 4.2 mEq/L (ref 3.5–5.3)
SODIUM: 138 meq/L (ref 135–145)
TOTAL PROTEIN: 7.4 g/dL (ref 6.0–8.3)

## 2013-11-06 NOTE — Progress Notes (Signed)
Subjective:    Patient ID: Zachary Hill, male    DOB: 05/02/1981, 33 y.o.   MRN: [id number]  HPI 32yo M with HIV, CD 4 count of 270/VL 208 (sep 2014) on RLG/DRVr/truvada. In the last month, missed 2 doses. He has been in a good state of health since his last visit. 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  He denies any recent illnesses.   Current Outpatient Prescriptions on File Prior to Visit  Medication Sig Dispense Refill  . dapsone 100 MG tablet Take 1 tablet (100 mg total) by mouth daily.  30 tablet  6  . Darunavir Ethanolate (PREZISTA) 800 MG tablet Take 1 tablet (800 mg total) by mouth daily.  30 tablet  6  . emtricitabine-tenofovir (TRUVADA) 200-300 MG per tablet Take 1 tablet by mouth daily.  30 tablet  11  . NORVIR 100 MG TABS tablet TAKE 1 TABLET (100 MG TOTAL) BY MOUTH DAILY.  30 tablet  0  . raltegravir (ISENTRESS) 400 MG tablet Take 1 tablet (400 mg total) by mouth 2 (two) times daily.  60 tablet  6   No current facility-administered medications on file prior to visit.   Active Ambulatory Problems    Diagnosis Date Noted  . Human immunodeficiency virus (HIV) disease 03/29/2012  . Human papillomavirus in conditions classified elsewhere and of unspecified site 03/29/2012  . Candidal esophagitis 04/29/2012  . Fever 05/11/2012  . Nausea & vomiting 05/11/2012  . Odynophagia 05/13/2012  . Diarrhea 05/14/2012  . Pharyngitis 05/14/2012  . Acute kidney injury 05/14/2012  . Thrombocytopenia 05/14/2012  . 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 05/18/2012  . Acute renal failure   . Anemia   . Dyspnea 05/20/2012  . CMV disease 06/29/2012  . 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 No Resolved Ambulatory Problems   Past Medical History  Diagnosis Date  . HIV disease   . Hepatitis B      Review of Systems Constitutional: Negative for  fever, chills, diaphoresis, activity change, appetite change, fatigue and unexpected weight change.  HENT: Negative for congestion, sore throat, rhinorrhea, sneezing, trouble swallowing and sinus pressure.  Eyes: Negative for photophobia and visual disturbance.  Respiratory: Negative for cough, chest tightness, shortness of breath, wheezing and stridor.  Cardiovascular: Negative for chest pain, palpitations and leg swelling.  Gastrointestinal: Negative for nausea, vomiting, abdominal pain, diarrhea, constipation, blood in stool, abdominal distention and anal bleeding.  Genitourinary: Negative for dysuria, hematuria, flank pain and difficulty urinating.  Musculoskeletal: Negative for myalgias, back pain, joint swelling, arthralgias and gait problem.  Skin: Negative for color change, pallor, rash and wound.  Neurological: Negative for dizziness, tremors, weakness and light-headedness.  Hematological: Negative for adenopathy. Does not bruise/bleed easily.  Psychiatric/Behavioral: Negative for behavioral problems, confusion, sleep disturbance, dysphoric mood, decreased concentration and agitation.       Objective:   Physical Exam BP 138/92  Pulse 71  Temp(Src) 98.2 F (36.8 C) (Oral)  Wt 208 lb (94.348 kg) Constitutional: He is oriented to person, place, and time. He appears well-developed and well-nourished. No distress.  HENT:  Mouth/Throat: Oropharynx is clear and moist. No oropharyngeal exudate.  Cardiovascular: Normal rate, regular rhythm and normal heart sounds. Exam reveals no gallop and no friction rub.  No murmur heard.  Pulmonary/Chest: Effort normal and breath sounds normal. No respiratory distress. He  has no wheezes.  Abdominal: Soft. Bowel sounds are normal. He exhibits no distension. There is no tenderness.  Lymphadenopathy:  He has no cervical adenopathy.  Neurological: He is alert and oriented to person, place, and time.  Skin: Skin is warm and dry. No rash noted. No  erythema.  Psychiatric: He has a normal mood and affect. His behavior is normal.     Assessment & Plan:  HIV disease = will check labs today including lipids. Discussed changing to tivicay QD instead of RLG BID. Await labs to see if he is virally suppressed. If repeat VL today is undetectable, then we will change her to tivicay 50mg  daily (then he stops taking isentress) but continues taking prezista, norvir, and truvada  Adherence = spent 15 min. To discuss the importance of taking meds regularly  oi proph = can discontinue daspone  Pre-htn = will follow at next visit.  rtc in 3months

## 2013-11-07 LAB — T-HELPER CELL (CD4) - (RCID CLINIC ONLY)
CD4 T CELL HELPER: 11 % — AB (ref 33–55)
CD4 T Cell Abs: 370 /uL — ABNORMAL LOW (ref 400–2700)

## 2013-11-07 LAB — HIV-1 RNA QUANT-NO REFLEX-BLD
HIV 1 RNA Quant: 613 copies/mL — ABNORMAL HIGH (ref <20)
HIV-1 RNA Quant, Log: 2.79 {Log} — ABNORMAL HIGH (ref <1.30)

## 2013-11-12 ENCOUNTER — Other Ambulatory Visit: Payer: Self-pay | Admitting: Internal Medicine

## 2014-01-20 ENCOUNTER — Other Ambulatory Visit: Payer: Medicaid Other

## 2014-02-03 ENCOUNTER — Ambulatory Visit: Payer: Medicaid Other | Admitting: Internal Medicine

## 2014-02-18 ENCOUNTER — Other Ambulatory Visit: Payer: Self-pay | Admitting: Internal Medicine

## 2014-02-18 DIAGNOSIS — B20 Human immunodeficiency virus [HIV] disease: Secondary | ICD-10-CM

## 2014-02-18 MED ORDER — DOLUTEGRAVIR SODIUM 50 MG PO TABS: 50.0000 mg | ORAL_TABLET | Freq: Two times a day (BID) | ORAL | Status: DC

## 2014-02-18 NOTE — Progress Notes (Signed)
Misunderstanding about taking RLG. He has been taking it only once a day. I have prescribed DLG  BID for now. Will test viral load with phenotype.

## 2014-04-09 ENCOUNTER — Other Ambulatory Visit: Payer: Self-pay | Admitting: Internal Medicine

## 2014-04-09 ENCOUNTER — Telehealth: Payer: Self-pay | Admitting: *Deleted

## 2014-04-09 DIAGNOSIS — B20 Human immunodeficiency virus [HIV] disease: Secondary | ICD-10-CM

## 2014-04-09 NOTE — Telephone Encounter (Signed)
Authorized 1 month supply Prezista. Patient is overdue for an appointment - has recently changed ART, is detectable.  LM on patient's voicemail asking him to contact us, notified pharmacy. Andree CossHowell, Naome Brigandi M, RN

## 2014-05-02 IMAGING — CR DG CHEST 1V PORT
1 series · 1 of 1 positions shown · non-contrast
Comparison: Portable exam 5915 hours compared to 05/17/2012

CLINICAL DATA: Increased shortness of breath, respiratory distress

PORTABLE CHEST - 1 VIEW

[AP]
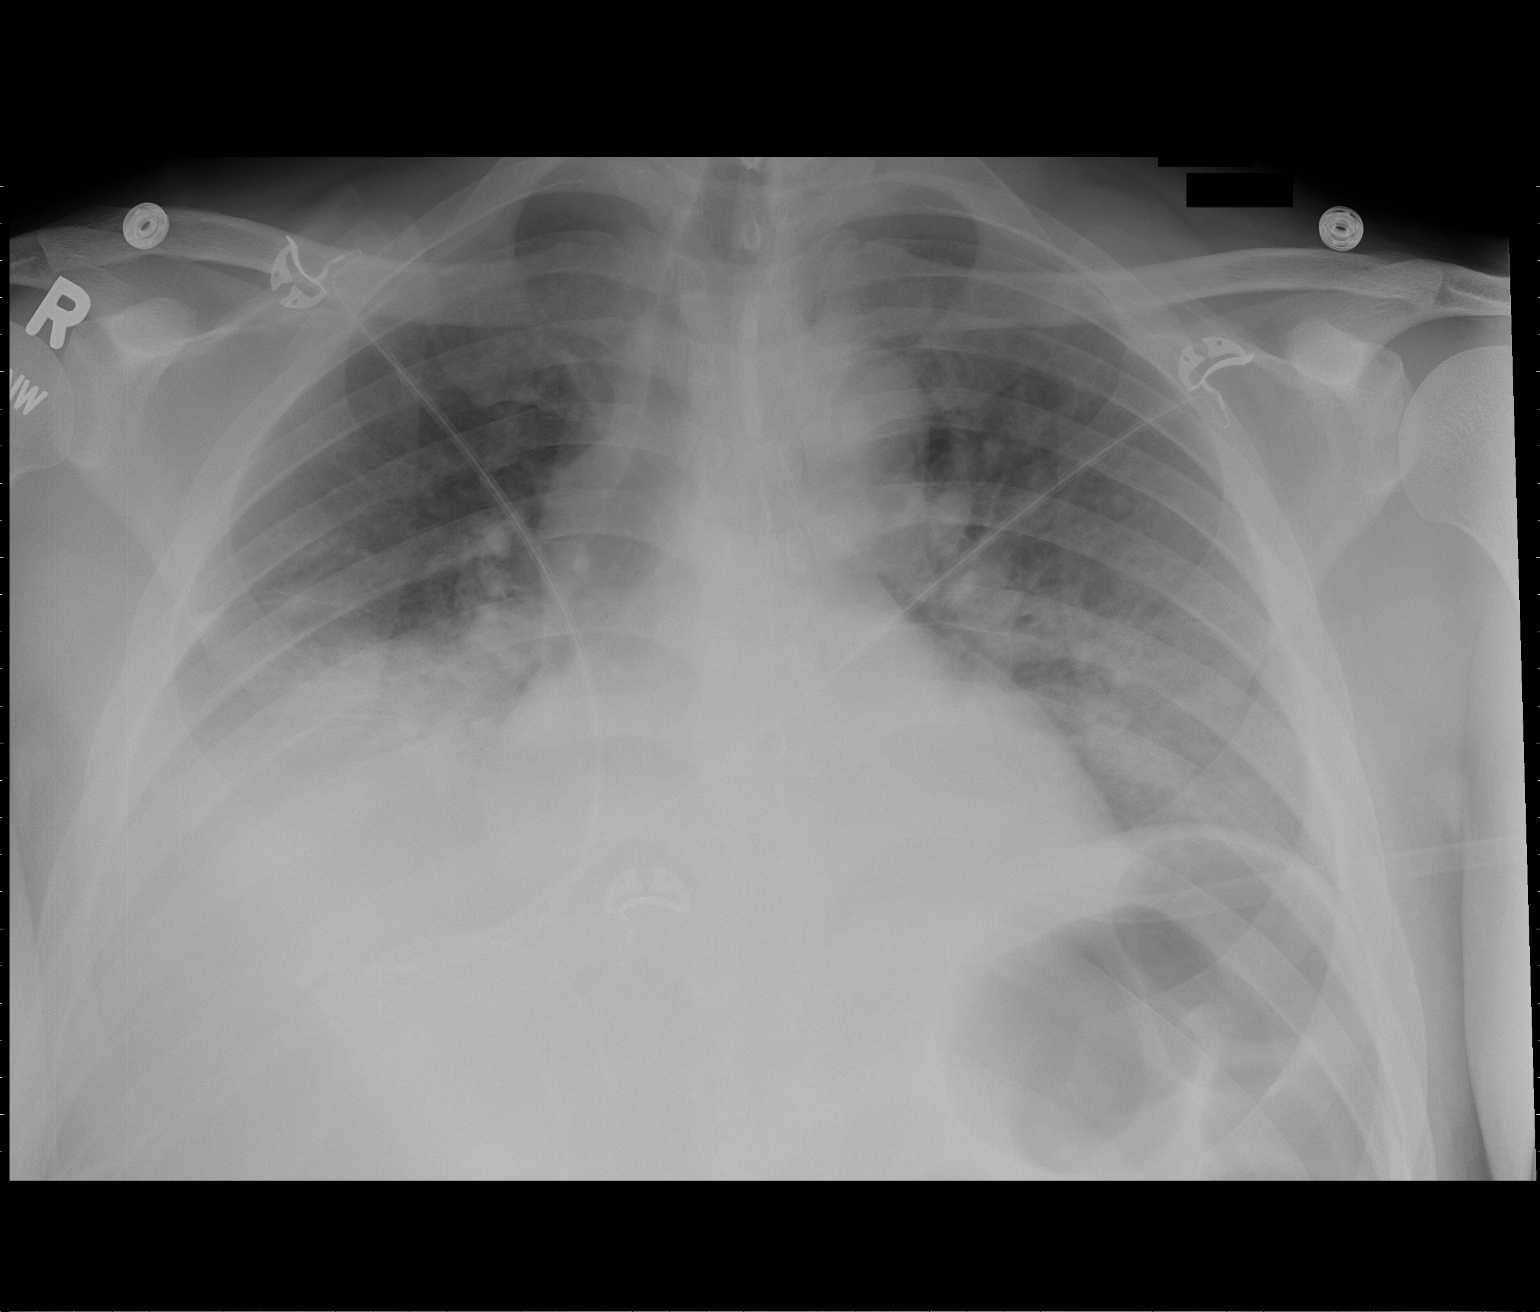

[1 of 1 positions shown; findings below may reference images not displayed]

FINDINGS: Upper normal heart size.
Slight pulmonary vascular congestion.
Significant airspace consolidation right lower lobe, suspect left
lower lobe as well.
No gross pleural effusion or pneumothorax.
Bones unremarkable.
IMPRESSION: Probable bilateral lower lobe infiltrates progressive since
previous study.

## 2014-05-11 ENCOUNTER — Other Ambulatory Visit: Payer: Self-pay | Admitting: Internal Medicine

## 2014-05-12 ENCOUNTER — Other Ambulatory Visit: Payer: Self-pay | Admitting: Internal Medicine

## 2014-06-01 ENCOUNTER — Other Ambulatory Visit: Payer: Self-pay | Admitting: Infectious Diseases

## 2014-06-02 ENCOUNTER — Telehealth: Payer: Self-pay | Admitting: *Deleted

## 2014-06-02 NOTE — Telephone Encounter (Signed)
Patient is overdue for an appointment after a regimen change, multiple attempts to reach the patient over the last 2 months have been unsuccessful.   Patient received delivery of partial regimen from [facility] last month - only Truvada, Tivicay, Norvir (NO Prezista as refill was not authorized).  RN left message telling patient that it is important he come to the clinic, gave details about the walk in clinic. MD please advise on medication refills - they are on hold pending advice. Andree Coss, RN

## 2014-06-02 NOTE — Telephone Encounter (Signed)
Can you make sure that [facility] also sends the prezista as well, just refill for 3 months

## 2014-06-03 ENCOUNTER — Telehealth: Payer: Self-pay | Admitting: *Deleted

## 2014-06-03 ENCOUNTER — Other Ambulatory Visit: Payer: Self-pay | Admitting: *Deleted

## 2014-06-03 DIAGNOSIS — B20 Human immunodeficiency virus [HIV] disease: Secondary | ICD-10-CM

## 2014-06-03 MED ORDER — DARUNAVIR ETHANOLATE 800 MG PO TABS: ORAL_TABLET | ORAL | Status: DC

## 2014-06-03 MED ORDER — RITONAVIR 100 MG PO TABS: ORAL_TABLET | ORAL | Status: DC

## 2014-06-03 NOTE — Telephone Encounter (Signed)
Sent refills

## 2014-06-03 NOTE — Telephone Encounter (Signed)
[facility] needs pt to call to arrange delivery.  Message left asking pt to call [facility].  Number given.

## 2014-06-12 ENCOUNTER — Other Ambulatory Visit: Payer: Self-pay | Admitting: Internal Medicine

## 2014-08-06 ENCOUNTER — Other Ambulatory Visit: Payer: Self-pay | Admitting: *Deleted

## 2014-08-06 ENCOUNTER — Other Ambulatory Visit: Payer: Self-pay | Admitting: Internal Medicine

## 2014-08-06 DIAGNOSIS — B20 Human immunodeficiency virus [HIV] disease: Secondary | ICD-10-CM

## 2014-08-06 MED ORDER — EMTRICITABINE-TENOFOVIR DF 200-300 MG PO TABS: ORAL_TABLET | ORAL | Status: DC

## 2014-09-04 ENCOUNTER — Telehealth: Payer: Self-pay | Admitting: *Deleted

## 2014-09-04 ENCOUNTER — Other Ambulatory Visit: Payer: Self-pay | Admitting: Internal Medicine

## 2014-09-04 NOTE — Telephone Encounter (Signed)
Denied refills.  Patient was given 3 months supply in September per Dr. Drue SecondSnider with the understanding that he MUST be seen.  Patient has not been seen in those 3 months. Gave walk in information to pharmacy to relay message to patient.  Spoke with patient. He understands that he was given a 3 month time frame to follow up with care.  Patient's medications will not be renewed until he comes in for at least lab work.  Patient informed that he can come Monday for labs.  He is informed of walk in clinics -- Monday thru Thursday, 9-11 and 2-4.   Andree CossHowell, Manika Hast M, RN

## 2014-09-14 ENCOUNTER — Other Ambulatory Visit (HOSPITAL_COMMUNITY)
Admission: RE | Admit: 2014-09-14 | Discharge: 2014-09-14 | Disposition: A | Discharge status: Home / Self Care | Payer: Medicare Other | Source: Ambulatory Visit | Attending: Internal Medicine | Admitting: Internal Medicine

## 2014-09-14 ENCOUNTER — Other Ambulatory Visit: Payer: Medicaid Other

## 2014-09-14 DIAGNOSIS — Z113 Encounter for screening for infections with a predominantly sexual mode of transmission: Secondary | ICD-10-CM

## 2014-09-14 DIAGNOSIS — B20 Human immunodeficiency virus [HIV] disease: Secondary | ICD-10-CM

## 2014-09-14 LAB — COMPLETE METABOLIC PANEL WITH GFR
ALK PHOS: 93 U/L (ref 39–117)
ALT: 20 U/L (ref 0–53)
AST: 19 U/L (ref 0–37)
Albumin: 4.2 g/dL (ref 3.5–5.2)
BUN: 13 mg/dL (ref 6–23)
CO2: 24 mEq/L (ref 19–32)
Calcium: 9.5 mg/dL (ref 8.4–10.5)
Chloride: 105 mEq/L (ref 96–112)
Creat: 1.18 mg/dL (ref 0.50–1.35)
GFR, Est African American: >89 mL/min
GFR, Est Non African American: 80 mL/min
Glucose, Bld: 108 mg/dL — ABNORMAL HIGH (ref 70–99)
POTASSIUM: 4.2 meq/L (ref 3.5–5.3)
SODIUM: 137 meq/L (ref 135–145)
TOTAL PROTEIN: 7.2 g/dL (ref 6.0–8.3)
Total Bilirubin: 0.7 mg/dL (ref 0.2–1.2)

## 2014-09-14 LAB — CBC WITH DIFFERENTIAL/PLATELET
BASOS ABS: 0 10*3/uL (ref 0.0–0.1)
BASOS PCT: 0 % (ref 0–1)
Eosinophils Absolute: 0.2 10*3/uL (ref 0.0–0.7)
Eosinophils Relative: 3 % (ref 0–5)
HCT: 43 % (ref 39.0–52.0)
Hemoglobin: 14.5 g/dL (ref 13.0–17.0)
Lymphocytes Relative: 56 % — ABNORMAL HIGH (ref 12–46)
Lymphs Abs: 3.1 10*3/uL (ref 0.7–4.0)
MCH: 27.3 pg (ref 26.0–34.0)
MCHC: 33.7 g/dL (ref 30.0–36.0)
MCV: 81 fL (ref 78.0–100.0)
MPV: 11.1 fL (ref 9.4–12.4)
Monocytes Absolute: 0.6 10*3/uL (ref 0.1–1.0)
Monocytes Relative: 11 % (ref 3–12)
NEUTROS ABS: 1.7 10*3/uL (ref 1.7–7.7)
Neutrophils Relative %: 30 % — ABNORMAL LOW (ref 43–77)
PLATELETS: 237 10*3/uL (ref 150–400)
RBC: 5.31 MIL/uL (ref 4.22–5.81)
RDW: 15 % (ref 11.5–15.5)
WBC: 5.6 10*3/uL (ref 4.0–10.5)

## 2014-09-14 NOTE — Addendum Note (Signed)
Addended bySteva Colder: Xander Jutras on: 09/14/2014 03:49 PM   Modules accepted: Orders

## 2014-09-15 LAB — URINE CYTOLOGY ANCILLARY ONLY
Chlamydia: NEGATIVE
NEISSERIA GONORRHEA: NEGATIVE

## 2014-09-15 LAB — HIV-1 RNA QUANT-NO REFLEX-BLD
HIV 1 RNA QUANT: 59 {copies}/mL — AB (ref <20)
HIV-1 RNA Quant, Log: 1.77 {Log} — ABNORMAL HIGH (ref <1.30)

## 2014-09-15 LAB — T-HELPER CELL (CD4) - (RCID CLINIC ONLY)
CD4 T CELL ABS: 430 /uL (ref 400–2700)
CD4 T CELL HELPER: 13 % — AB (ref 33–55)

## 2014-09-15 LAB — RPR

## 2014-10-29 ENCOUNTER — Ambulatory Visit: Payer: Medicaid Other | Admitting: Internal Medicine

## 2015-02-05 ENCOUNTER — Other Ambulatory Visit: Payer: Self-pay | Admitting: Internal Medicine

## 2015-02-05 DIAGNOSIS — B20 Human immunodeficiency virus [HIV] disease: Secondary | ICD-10-CM

## 2015-02-05 MED ORDER — DARUNAVIR ETHANOLATE 800 MG PO TABS: ORAL_TABLET | ORAL | Status: DC

## 2015-02-05 MED ORDER — DOLUTEGRAVIR SODIUM 50 MG PO TABS: 50.0000 mg | ORAL_TABLET | Freq: Two times a day (BID) | ORAL | Status: DC

## 2015-02-11 ENCOUNTER — Other Ambulatory Visit (HOSPITAL_COMMUNITY)
Admission: RE | Admit: 2015-02-11 | Discharge: 2015-02-11 | Disposition: A | Discharge status: Home / Self Care | Payer: Medicare Other | Source: Ambulatory Visit | Attending: Internal Medicine | Admitting: Internal Medicine

## 2015-02-11 ENCOUNTER — Ambulatory Visit (INDEPENDENT_AMBULATORY_CARE_PROVIDER_SITE_OTHER): Payer: Medicare Other | Admitting: Internal Medicine

## 2015-02-11 ENCOUNTER — Encounter: Payer: Self-pay | Admitting: Internal Medicine

## 2015-02-11 VITALS — BP 127/82 | HR 66 | Temp 98.5°F | Wt 213.0 lb

## 2015-02-11 DIAGNOSIS — Z0000 Encounter for general adult medical examination without abnormal findings: Secondary | ICD-10-CM

## 2015-02-11 DIAGNOSIS — B20 Human immunodeficiency virus [HIV] disease: Secondary | ICD-10-CM | POA: Diagnosis present

## 2015-02-11 DIAGNOSIS — Z113 Encounter for screening for infections with a predominantly sexual mode of transmission: Secondary | ICD-10-CM | POA: Insufficient documentation

## 2015-02-11 LAB — COMPLETE METABOLIC PANEL WITH GFR
ALT: 20 U/L (ref 0–53)
AST: 16 U/L (ref 0–37)
Albumin: 4.2 g/dL (ref 3.5–5.2)
Alkaline Phosphatase: 83 U/L (ref 39–117)
BILIRUBIN TOTAL: 0.6 mg/dL (ref 0.2–1.2)
BUN: 14 mg/dL (ref 6–23)
CO2: 21 meq/L (ref 19–32)
CREATININE: 1.28 mg/dL (ref 0.50–1.35)
Calcium: 9.8 mg/dL (ref 8.4–10.5)
Chloride: 107 mEq/L (ref 96–112)
GFR, EST AFRICAN AMERICAN: 84 mL/min
GFR, Est Non African American: 73 mL/min
Glucose, Bld: 110 mg/dL — ABNORMAL HIGH (ref 70–99)
Potassium: 4.2 mEq/L (ref 3.5–5.3)
Sodium: 139 mEq/L (ref 135–145)
Total Protein: 7.1 g/dL (ref 6.0–8.3)

## 2015-02-11 LAB — CBC WITH DIFFERENTIAL/PLATELET
BASOS ABS: 0 10*3/uL (ref 0.0–0.1)
Basophils Relative: 0 % (ref 0–1)
Eosinophils Absolute: 0.2 10*3/uL (ref 0.0–0.7)
Eosinophils Relative: 4 % (ref 0–5)
HCT: 43.8 % (ref 39.0–52.0)
Hemoglobin: 14.7 g/dL (ref 13.0–17.0)
Lymphocytes Relative: 53 % — ABNORMAL HIGH (ref 12–46)
Lymphs Abs: 2.8 10*3/uL (ref 0.7–4.0)
MCH: 27.7 pg (ref 26.0–34.0)
MCHC: 33.6 g/dL (ref 30.0–36.0)
MCV: 82.6 fL (ref 78.0–100.0)
MONOS PCT: 10 % (ref 3–12)
MPV: 10.4 fL (ref 8.6–12.4)
Monocytes Absolute: 0.5 10*3/uL (ref 0.1–1.0)
NEUTROS ABS: 1.7 10*3/uL (ref 1.7–7.7)
Neutrophils Relative %: 33 % — ABNORMAL LOW (ref 43–77)
PLATELETS: 207 10*3/uL (ref 150–400)
RBC: 5.3 MIL/uL (ref 4.22–5.81)
RDW: 16 % — AB (ref 11.5–15.5)
WBC: 5.2 10*3/uL (ref 4.0–10.5)

## 2015-02-11 MED ORDER — ELVITEG-COBIC-EMTRICIT-TENOFAF 150-150-200-10 MG PO TABS: 1.0000 | ORAL_TABLET | Freq: Every day | ORAL | Status: DC

## 2015-02-11 NOTE — Progress Notes (Signed)
Patient ID: Zachary Hill, male   DOB: 1980-10-26, 34 y.o.   MRN: [id number]       Patient ID: Zachary Hill, male   DOB: 1980-10-26, 34 y.o.   MRN: [id number]  HPI Zachary Hill is  34yo M with HIV disease, CD 4 count of 430/VL 59, DLG/truvada/DRVr. He has been doing well with his medicaitons and has not missed a dose. 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  He is starting to get involved in a relationship  Outpatient Encounter Prescriptions as of 02/11/2015  Medication Sig  . Darunavir Ethanolate (PREZISTA) 800 MG tablet TAKE 1 TABLET (800 MG TOTAL) BY MOUTH DAILY.  Marland Kitchen. dolutegravir (TIVICAY) 50 MG tablet Take 1 tablet (50 mg total) by mouth 2 (two) times daily.  Marland Kitchen. emtricitabine-tenofovir (TRUVADA) 200-300 MG per tablet TAKE 1 TABLET BY MOUTH DAILY.  . ritonavir (NORVIR) 100 MG TABS tablet TAKE 1 TABLET (100 MG TOTAL) BY MOUTH DAILY.   No facility-administered encounter medications on file as of 02/11/2015.     Patient Active Problem List   Diagnosis Date Noted  . CMV disease 06/29/2012  . 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 Dyspnea 05/20/2012  . Acute renal failure   . Anemia   . 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 05/18/2012  . Diarrhea 05/14/2012  . Pharyngitis 05/14/2012  . Acute kidney injury 05/14/2012  . Thrombocytopenia 05/14/2012  . Odynophagia 05/13/2012  . Fever 05/11/2012  . Nausea & vomiting 05/11/2012  . Candidal esophagitis 04/29/2012  . Human immunodeficiency virus (HIV) disease 03/29/2012  . Human papillomavirus in conditions classified elsewhere and of unspecified site 03/29/2012    Review of Systems  Constitutional: Negative for fever, chills, diaphoresis, activity change, appetite change, fatigue and unexpected weight change.  HENT: Negative for congestion, sore throat, rhinorrhea, sneezing, trouble swallowing and sinus pressure.  Eyes: Negative for photophobia and  visual disturbance.  Respiratory: Negative for cough, chest tightness, shortness of breath, wheezing and stridor.  Cardiovascular: Negative for chest pain, palpitations and leg swelling.  Gastrointestinal: Negative for nausea, vomiting, abdominal pain, diarrhea, constipation, blood in stool, abdominal distention and anal bleeding.  Genitourinary: Negative for dysuria, hematuria, flank pain and difficulty urinating.  Musculoskeletal: Negative for myalgias, back pain, joint swelling, arthralgias and gait problem.  Skin: Negative for color change, pallor, rash and wound.  Neurological: Negative for dizziness, tremors, weakness and light-headedness.  Hematological: Negative for adenopathy. Does not bruise/bleed easily.  Psychiatric/Behavioral: Negative for behavioral problems, confusion, sleep disturbance, dysphoric mood, decreased concentration and agitation.    Physical Exam   BP 127/82 mmHg  Pulse 66  Temp(Src) 98.5 F (36.9 C) (Oral)  Wt 213 lb (96.616 kg) Physical Exam  Constitutional: He is oriented to person, place, and time. He appears well-developed and well-nourished. No distress.  HENT:  Mouth/Throat: Oropharynx is clear and moist. No oropharyngeal exudate.  Cardiovascular: Normal rate, regular rhythm and normal heart sounds. Exam reveals no gallop and no friction rub.  No murmur heard.  Pulmonary/Chest: Effort normal and breath sounds normal. No respiratory distress. He has no wheezes.  Abdominal: Soft. Bowel sounds are normal. He exhibits no distension. There is no tenderness.  Lymphadenopathy:  He has no cervical adenopathy.  Neurological: He is alert and oriented to person, place, and time.  Skin: Skin is warm and dry. No rash noted. No erythema.  Psychiatric: He has a normal mood and affect. His behavior is  normal.    Lab Results  Component Value Date   CD4TCELL 13* 09/14/2014   Lab Results  Component Value Date   CD4TABS 430 09/14/2014   CD4TABS 370* 11/06/2013     CD4TABS 270* 07/01/2013   Lab Results  Component Value Date   HIV1RNAQUANT 59* 09/14/2014   Lab Results  Component Value Date   HEPBSAB POS* 03/21/2012   No results found for: RPR  CBC Lab Results  Component Value Date   WBC 5.6 09/14/2014   RBC 5.31 09/14/2014   HGB 14.5 09/14/2014   HCT 43.0 09/14/2014   PLT 237 09/14/2014   MCV 81.0 09/14/2014   MCH 27.3 09/14/2014   MCHC 33.7 09/14/2014   RDW 15.0 09/14/2014   LYMPHSABS 3.1 09/14/2014   MONOABS 0.6 09/14/2014   EOSABS 0.2 09/14/2014   BASOSABS 0.0 09/14/2014   BMET Lab Results  Component Value Date   NA 137 09/14/2014   K 4.2 09/14/2014   CL 105 09/14/2014   CO2 24 09/14/2014   GLUCOSE 108* 09/14/2014   BUN 13 09/14/2014   CREATININE 1.18 09/14/2014   CALCIUM 9.5 09/14/2014   GFRNONAA 80 09/14/2014   GFRAA >89 09/14/2014     Assessment and Plan  hiv disease = to reduce pill burden, we will switch to genovoya plus darunavir regimen. Will check labs today  Health maintenance = will check rpr and gc/chalm

## 2015-02-12 ENCOUNTER — Encounter (HOSPITAL_COMMUNITY): Payer: Self-pay | Admitting: Emergency Medicine

## 2015-02-12 ENCOUNTER — Emergency Department (HOSPITAL_COMMUNITY)
Admission: EM | Admit: 2015-02-12 | Discharge: 2015-02-13 | Disposition: A | Discharge status: Home / Self Care | Payer: Medicare Other | Attending: Emergency Medicine | Admitting: Emergency Medicine

## 2015-02-12 DIAGNOSIS — Z8619 Personal history of other infectious and parasitic diseases: Secondary | ICD-10-CM | POA: Insufficient documentation

## 2015-02-12 DIAGNOSIS — Y9389 Activity, other specified: Secondary | ICD-10-CM | POA: Insufficient documentation

## 2015-02-12 DIAGNOSIS — Z862 Personal history of diseases of the blood and blood-forming organs and certain disorders involving the immune mechanism: Secondary | ICD-10-CM | POA: Insufficient documentation

## 2015-02-12 DIAGNOSIS — S3992XA Unspecified injury of lower back, initial encounter: Secondary | ICD-10-CM | POA: Insufficient documentation

## 2015-02-12 DIAGNOSIS — Y92410 Unspecified street and highway as the place of occurrence of the external cause: Secondary | ICD-10-CM | POA: Insufficient documentation

## 2015-02-12 DIAGNOSIS — Z87448 Personal history of other diseases of urinary system: Secondary | ICD-10-CM | POA: Insufficient documentation

## 2015-02-12 DIAGNOSIS — Z79899 Other long term (current) drug therapy: Secondary | ICD-10-CM | POA: Diagnosis not present

## 2015-02-12 DIAGNOSIS — S4991XA Unspecified injury of right shoulder and upper arm, initial encounter: Secondary | ICD-10-CM | POA: Diagnosis present

## 2015-02-12 DIAGNOSIS — S42141A Displaced fracture of glenoid cavity of scapula, right shoulder, initial encounter for closed fracture: Secondary | ICD-10-CM | POA: Insufficient documentation

## 2015-02-12 DIAGNOSIS — T148XXA Other injury of unspecified body region, initial encounter: Secondary | ICD-10-CM

## 2015-02-12 DIAGNOSIS — Z720 Tobacco use: Secondary | ICD-10-CM | POA: Diagnosis not present

## 2015-02-12 DIAGNOSIS — Y998 Other external cause status: Secondary | ICD-10-CM | POA: Insufficient documentation

## 2015-02-12 LAB — T-HELPER CELL (CD4) - (RCID CLINIC ONLY)
CD4 % Helper T Cell: 14 % — ABNORMAL LOW (ref 33–55)
CD4 T CELL ABS: 400 /uL (ref 400–2700)

## 2015-02-12 LAB — URINE CYTOLOGY ANCILLARY ONLY
CHLAMYDIA, DNA PROBE: NEGATIVE
NEISSERIA GONORRHEA: NEGATIVE

## 2015-02-12 LAB — RPR
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
## 2015-02-13 ENCOUNTER — Emergency Department (HOSPITAL_COMMUNITY): Payer: Medicare Other

## 2015-02-13 DIAGNOSIS — S42141A Displaced fracture of glenoid cavity of scapula, right shoulder, initial encounter for closed fracture: Secondary | ICD-10-CM | POA: Diagnosis not present

## 2015-02-13 LAB — HIV-1 RNA QUANT-NO REFLEX-BLD
HIV 1 RNA Quant: 72 copies/mL — ABNORMAL HIGH (ref <20)
HIV-1 RNA Quant, Log: 1.86 {Log} — ABNORMAL HIGH (ref <1.30)

## 2015-02-13 MED ORDER — HYDROCODONE-ACETAMINOPHEN 5-325 MG PO TABS
1.0000 | ORAL_TABLET | Freq: Once | ORAL | Status: AC
  Administered 2015-02-13: 1 via ORAL
  Filled 2015-02-13: qty 1

## 2015-02-13 MED ORDER — HYDROCODONE-ACETAMINOPHEN 5-325 MG PO TABS: 1.0000 | ORAL_TABLET | ORAL | Status: DC | PRN

## 2015-02-13 NOTE — ED Notes (Signed)
Pt stable, ambulatory, pain decresed to 5/10, friend at bedside, states understanding of discharge instructions

## 2015-02-13 NOTE — ED Notes (Signed)
Gave pt drink and graham crackers, per Lockie ParesJaclyn - RN

## 2015-02-13 NOTE — Discharge Instructions (Signed)
Call and make an appointment to follow-up with the orthopedist. Take medications as prescribed.

## 2015-03-05 ENCOUNTER — Other Ambulatory Visit: Payer: Self-pay | Admitting: Internal Medicine

## 2016-01-19 ENCOUNTER — Other Ambulatory Visit: Payer: Self-pay | Admitting: Internal Medicine

## 2016-01-19 ENCOUNTER — Other Ambulatory Visit: Payer: Self-pay | Admitting: *Deleted

## 2016-01-19 DIAGNOSIS — B20 Human immunodeficiency virus [HIV] disease: Secondary | ICD-10-CM

## 2016-01-19 MED ORDER — DARUNAVIR ETHANOLATE 800 MG PO TABS: ORAL_TABLET | ORAL | Status: DC

## 2016-02-10 ENCOUNTER — Ambulatory Visit (INDEPENDENT_AMBULATORY_CARE_PROVIDER_SITE_OTHER): Payer: Medicare Other | Admitting: Internal Medicine

## 2016-02-10 ENCOUNTER — Other Ambulatory Visit (HOSPITAL_COMMUNITY)
Admission: RE | Admit: 2016-02-10 | Discharge: 2016-02-10 | Disposition: A | Discharge status: Home / Self Care | Payer: Medicare Other | Source: Ambulatory Visit | Attending: Internal Medicine | Admitting: Internal Medicine

## 2016-02-10 ENCOUNTER — Encounter: Payer: Self-pay | Admitting: Internal Medicine

## 2016-02-10 VITALS — BP 126/85 | HR 76 | Temp 97.6°F | Wt 217.0 lb

## 2016-02-10 DIAGNOSIS — Z0000 Encounter for general adult medical examination without abnormal findings: Secondary | ICD-10-CM

## 2016-02-10 DIAGNOSIS — B20 Human immunodeficiency virus [HIV] disease: Secondary | ICD-10-CM | POA: Diagnosis present

## 2016-02-10 DIAGNOSIS — Z113 Encounter for screening for infections with a predominantly sexual mode of transmission: Secondary | ICD-10-CM | POA: Insufficient documentation

## 2016-02-10 DIAGNOSIS — A630 Anogenital (venereal) warts: Secondary | ICD-10-CM | POA: Diagnosis not present

## 2016-02-10 MED ORDER — DARUNAVIR ETHANOLATE 800 MG PO TABS: ORAL_TABLET | ORAL | Status: DC

## 2016-02-10 MED ORDER — ELVITEG-COBIC-EMTRICIT-TENOFAF 150-150-200-10 MG PO TABS: 1.0000 | ORAL_TABLET | Freq: Every day | ORAL | Status: DC

## 2016-02-10 NOTE — Progress Notes (Signed)
Patient ID: Zachary Hill, male   DOB: Jan 07, 1981, 35 y.o.   MRN: [id number]     RFV: annual visit for hiv disease management  Patient ID: Zachary Hill, male   DOB: 1981-08-13, 35 y.o.   MRN: [id number]  HPI 35yo M with HIV disease, hx of CMV disease at time of dx, CD 4 count of 400/VL 72 (april 2017) currently on genvoya-DRV. Has not been seen in the past year but doing well. 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 He reports that he is doing well. He is starting to become intimate with new partner, they have known each other for 4 months. His partner is also hiv positive, in care at [facility]  [name] has started informal support group within his church community  Outpatient Encounter Prescriptions as of 02/10/2016  Medication Sig  . Darunavir Ethanolate (PREZISTA) 800 MG tablet TAKE 1 TABLET (800 MG TOTAL) BY MOUTH DAILY.  . GENVOYA 150-150-200-10 MG TABS tablet TAKE 1 TABLET BY MOUTH DAILY WITH BREAKFAST.  Marland Kitchen HYDROcodone-acetaminophen (NORCO) 5-325 MG per tablet Take 1 tablet by mouth every 4 (four) hours as needed for moderate pain or severe pain. (Patient not taking: Reported on 02/10/2016)   No facility-administered encounter medications on file as of 02/10/2016.     Patient Active Problem List   Diagnosis Date Noted  . 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  . 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 Dyspnea 05/20/2012  . Acute renal failure (HCC)   . Anemia   . 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) 05/18/2012  . Diarrhea 05/14/2012  . Pharyngitis 05/14/2012  . Acute kidney injury (HCC) 05/14/2012  . Thrombocytopenia (HCC) 05/14/2012  . Odynophagia 05/13/2012  . Fever 05/11/2012  . Nausea & vomiting 05/11/2012  . Candidal esophagitis (HCC) 04/29/2012  . Human immunodeficiency virus (HIV) disease (HCC) 03/29/2012  . Human papillomavirus in conditions classified elsewhere and of unspecified site 03/29/2012     Health Maintenance Due  Topic Date Due  . TETANUS/TDAP   08/26/1999     Review of Systems  Constitutional: Negative for fever, chills, diaphoresis, activity change, appetite change, fatigue and unexpected weight change.  HENT: Negative for congestion, sore throat, rhinorrhea, sneezing, trouble swallowing and sinus pressure.  Eyes: Negative for photophobia and visual disturbance.  Respiratory: Negative for cough, chest tightness, shortness of breath, wheezing and stridor.  Cardiovascular: Negative for chest pain, palpitations and leg swelling.  Gastrointestinal: Negative for nausea, vomiting, abdominal pain, diarrhea, constipation, blood in stool, abdominal distention and anal bleeding.  Genitourinary: Negative for dysuria, hematuria, flank pain and difficulty urinating.  Musculoskeletal: Negative for myalgias, back pain, joint swelling, arthralgias and gait problem.  Skin: Negative for color change, pallor, rash and wound.  Neurological: Negative for dizziness, tremors, weakness and light-headedness.  Hematological: Negative for adenopathy. Does not bruise/bleed easily.  Psychiatric/Behavioral: Negative for behavioral problems, confusion, sleep disturbance, dysphoric mood, decreased concentration and agitation.    Physical Exam   BP 126/85 mmHg  Pulse 76  Temp(Src) 97.6 F (36.4 C)  Wt 217 lb (98.431 kg)  Constitutional: He is oriented to person, place, and time. He appears well-developed and well-nourished. No distress.  HENT:  Mouth/Throat: Oropharynx is clear and moist. No oropharyngeal exudate.  Cardiovascular: Normal rate, regular rhythm and normal heart sounds. Exam reveals no gallop and no friction rub.  No murmur heard.  Pulmonary/Chest: Effort normal and breath sounds normal. No respiratory distress. He has no wheezes.  Abdominal: Soft. Bowel sounds are normal.  He exhibits no distension. There is no tenderness.  Lymphadenopathy:  He has no cervical adenopathy.  Gu: rectal exam is negative. Flat wart at 11 oclock Neurological:  He is alert and oriented to person, place, and time.  Skin: Skin is warm and dry. No rash noted. No erythema.  Psychiatric: He has a normal mood and affect. His behavior is normal.    Lab Results  Component Value Date   CD4TCELL 14* 02/11/2015   Lab Results  Component Value Date   CD4TABS 400 02/11/2015   CD4TABS 430 09/14/2014   CD4TABS 370* 11/06/2013   Lab Results  Component Value Date   HIV1RNAQUANT 72* 02/11/2015   Lab Results  Component Value Date   HEPBSAB POS* 03/21/2012   No results found for: RPR  CBC Lab Results  Component Value Date   WBC 5.2 02/11/2015   RBC 5.30 02/11/2015   HGB 14.7 02/11/2015   HCT 43.8 02/11/2015   PLT 207 02/11/2015   MCV 82.6 02/11/2015   MCH 27.7 02/11/2015   MCHC 33.6 02/11/2015   RDW 16.0* 02/11/2015   LYMPHSABS 2.8 02/11/2015   MONOABS 0.5 02/11/2015   EOSABS 0.2 02/11/2015   BASOSABS 0.0 02/11/2015   BMET Lab Results  Component Value Date   NA 139 02/11/2015   K 4.2 02/11/2015   CL 107 02/11/2015   CO2 21 02/11/2015   GLUCOSE 110* 02/11/2015   BUN 14 02/11/2015   CREATININE 1.28 02/11/2015   CALCIUM 9.8 02/11/2015   GFRNONAA 73 02/11/2015   GFRAA 84 02/11/2015     Assessment and Plan  HIV disease =continue with genvoya-DRV. Slight viral blip. Will re-check in 3 months. encouarged not to miss any doses  health maintenance = will check for gc/chlam in oral and rectal swab  Anal warts = will need to schedule HRA in next 6 months

## 2016-02-11 LAB — CYTOLOGY, (ORAL, ANAL, URETHRAL) ANCILLARY ONLY
CHLAMYDIA, DNA PROBE: POSITIVE — AB
Chlamydia: NEGATIVE
NEISSERIA GONORRHEA: NEGATIVE
Neisseria Gonorrhea: NEGATIVE

## 2016-02-14 ENCOUNTER — Ambulatory Visit: Payer: Medicare Other

## 2016-02-14 ENCOUNTER — Telehealth: Payer: Self-pay | Admitting: *Deleted

## 2016-02-14 NOTE — Telephone Encounter (Signed)
-----   Message from Judyann Munsonynthia Snider, MD sent at 02/11/2016 12:32 PM EDT ----- Can we bring him in for treatment of chlamydia. Needs 1gm of azithromycin and dose of im ceftriaxone 250mg

## 2016-02-14 NOTE — Telephone Encounter (Signed)
Per Dr Drue SecondSnider order called the patient to advise him he needs treatment for chlamydia and the patient is coming for this today 02/14/16 at 2 pm.

## 2016-02-15 ENCOUNTER — Ambulatory Visit (INDEPENDENT_AMBULATORY_CARE_PROVIDER_SITE_OTHER): Payer: Medicare Other | Admitting: *Deleted

## 2016-02-15 DIAGNOSIS — A64 Unspecified sexually transmitted disease: Secondary | ICD-10-CM | POA: Diagnosis not present

## 2016-02-15 MED ORDER — CEFTRIAXONE SODIUM 1 G IJ SOLR
250.0000 mg | Freq: Once | INTRAMUSCULAR | Status: AC
  Administered 2016-02-15: 250 mg via INTRAMUSCULAR

## 2016-02-15 MED ORDER — AZITHROMYCIN 250 MG PO TABS
1000.0000 mg | ORAL_TABLET | Freq: Once | ORAL | Status: AC
  Administered 2016-02-15: 1000 mg via ORAL

## 2016-07-31 ENCOUNTER — Other Ambulatory Visit: Payer: Self-pay | Admitting: Internal Medicine

## 2016-07-31 ENCOUNTER — Other Ambulatory Visit: Payer: Medicare Other

## 2016-07-31 DIAGNOSIS — Z21 Asymptomatic human immunodeficiency virus [HIV] infection status: Secondary | ICD-10-CM

## 2016-07-31 LAB — COMPREHENSIVE METABOLIC PANEL
ALBUMIN: 4.4 g/dL (ref 3.6–5.1)
ALT: 17 U/L (ref 9–46)
AST: 20 U/L (ref 10–40)
Alkaline Phosphatase: 77 U/L (ref 40–115)
BUN: 12 mg/dL (ref 7–25)
CHLORIDE: 104 mmol/L (ref 98–110)
CO2: 23 mmol/L (ref 20–31)
CREATININE: 1.08 mg/dL (ref 0.60–1.35)
Calcium: 9.8 mg/dL (ref 8.6–10.3)
Glucose, Bld: 82 mg/dL (ref 65–99)
Potassium: 4.5 mmol/L (ref 3.5–5.3)
SODIUM: 137 mmol/L (ref 135–146)
Total Bilirubin: 0.4 mg/dL (ref 0.2–1.2)
Total Protein: 7.3 g/dL (ref 6.1–8.1)

## 2016-07-31 LAB — CBC WITH DIFFERENTIAL/PLATELET
Basophils Absolute: 0 cells/uL (ref 0–200)
Basophils Relative: 0 %
EOS PCT: 3 %
Eosinophils Absolute: 195 cells/uL (ref 15–500)
HCT: 43.7 % (ref 38.5–50.0)
HEMOGLOBIN: 14.8 g/dL (ref 13.2–17.1)
Lymphocytes Relative: 49 %
Lymphs Abs: 3185 cells/uL (ref 850–3900)
MCH: 28.6 pg (ref 27.0–33.0)
MCHC: 33.9 g/dL (ref 32.0–36.0)
MCV: 84.5 fL (ref 80.0–100.0)
MONOS PCT: 11 %
MPV: 10.9 fL (ref 7.5–12.5)
Monocytes Absolute: 715 cells/uL (ref 200–950)
NEUTROS PCT: 37 %
Neutro Abs: 2405 cells/uL (ref 1500–7800)
PLATELETS: 244 10*3/uL (ref 140–400)
RBC: 5.17 MIL/uL (ref 4.20–5.80)
RDW: 15.3 % — ABNORMAL HIGH (ref 11.0–15.0)
WBC: 6.5 10*3/uL (ref 3.8–10.8)

## 2016-08-01 LAB — HIV-1 RNA QUANT-NO REFLEX-BLD
HIV 1 RNA Quant: 50 copies/mL — ABNORMAL HIGH (ref <20)
HIV-1 RNA Quant, Log: 1.7 Log copies/mL — ABNORMAL HIGH (ref <1.30)

## 2016-08-01 LAB — T-HELPER CELL (CD4) - (RCID CLINIC ONLY)
CD4 % Helper T Cell: 17 % — ABNORMAL LOW (ref 33–55)
CD4 T Cell Abs: 550 /uL (ref 400–2700)

## 2016-08-14 ENCOUNTER — Ambulatory Visit: Payer: Medicare Other | Admitting: Internal Medicine

## 2016-12-15 DIAGNOSIS — Z79899 Other long term (current) drug therapy: Secondary | ICD-10-CM | POA: Diagnosis not present

## 2016-12-15 DIAGNOSIS — B20 Human immunodeficiency virus [HIV] disease: Secondary | ICD-10-CM | POA: Diagnosis not present

## 2016-12-15 DIAGNOSIS — F17210 Nicotine dependence, cigarettes, uncomplicated: Secondary | ICD-10-CM | POA: Diagnosis not present

## 2016-12-15 DIAGNOSIS — Z0000 Encounter for general adult medical examination without abnormal findings: Secondary | ICD-10-CM | POA: Diagnosis not present

## 2016-12-15 DIAGNOSIS — R21 Rash and other nonspecific skin eruption: Secondary | ICD-10-CM | POA: Diagnosis not present

## 2017-02-05 DIAGNOSIS — J36 Peritonsillar abscess: Secondary | ICD-10-CM | POA: Diagnosis not present

## 2017-02-06 ENCOUNTER — Other Ambulatory Visit: Payer: Self-pay | Admitting: Internal Medicine

## 2017-02-06 DIAGNOSIS — B20 Human immunodeficiency virus [HIV] disease: Secondary | ICD-10-CM

## 2017-06-26 DIAGNOSIS — R631 Polydipsia: Secondary | ICD-10-CM | POA: Diagnosis not present

## 2017-06-26 DIAGNOSIS — R5383 Other fatigue: Secondary | ICD-10-CM | POA: Diagnosis not present

## 2017-06-26 DIAGNOSIS — R1310 Dysphagia, unspecified: Secondary | ICD-10-CM | POA: Diagnosis not present

## 2017-06-26 DIAGNOSIS — B20 Human immunodeficiency virus [HIV] disease: Secondary | ICD-10-CM | POA: Diagnosis not present

## 2017-06-26 DIAGNOSIS — R358 Other polyuria: Secondary | ICD-10-CM | POA: Diagnosis not present

## 2017-06-26 DIAGNOSIS — B379 Candidiasis, unspecified: Secondary | ICD-10-CM | POA: Diagnosis not present

## 2017-06-26 DIAGNOSIS — R3915 Urgency of urination: Secondary | ICD-10-CM | POA: Diagnosis not present

## 2017-06-26 DIAGNOSIS — R55 Syncope and collapse: Secondary | ICD-10-CM | POA: Diagnosis not present

## 2017-06-26 DIAGNOSIS — E1165 Type 2 diabetes mellitus with hyperglycemia: Secondary | ICD-10-CM | POA: Diagnosis not present

## 2017-06-26 DIAGNOSIS — B370 Candidal stomatitis: Secondary | ICD-10-CM | POA: Diagnosis not present

## 2017-07-11 DIAGNOSIS — E119 Type 2 diabetes mellitus without complications: Secondary | ICD-10-CM | POA: Diagnosis not present

## 2017-09-18 ENCOUNTER — Other Ambulatory Visit: Payer: Self-pay

## 2018-08-01 DIAGNOSIS — E860 Dehydration: Secondary | ICD-10-CM | POA: Diagnosis not present

## 2018-08-01 DIAGNOSIS — Z882 Allergy status to sulfonamides status: Secondary | ICD-10-CM | POA: Diagnosis not present

## 2018-08-01 DIAGNOSIS — E1165 Type 2 diabetes mellitus with hyperglycemia: Secondary | ICD-10-CM | POA: Diagnosis not present

## 2018-08-01 DIAGNOSIS — Z760 Encounter for issue of repeat prescription: Secondary | ICD-10-CM | POA: Diagnosis not present

## 2018-08-01 DIAGNOSIS — R739 Hyperglycemia, unspecified: Secondary | ICD-10-CM | POA: Diagnosis not present

## 2018-08-01 DIAGNOSIS — R51 Headache: Secondary | ICD-10-CM | POA: Diagnosis not present

## 2018-08-01 DIAGNOSIS — R350 Frequency of micturition: Secondary | ICD-10-CM | POA: Diagnosis not present

## 2018-12-13 ENCOUNTER — Other Ambulatory Visit: Payer: Self-pay | Admitting: *Deleted

## 2018-12-13 DIAGNOSIS — B20 Human immunodeficiency virus [HIV] disease: Secondary | ICD-10-CM

## 2018-12-16 ENCOUNTER — Other Ambulatory Visit: Payer: Medicare Other

## 2018-12-16 ENCOUNTER — Other Ambulatory Visit: Payer: Self-pay

## 2018-12-16 ENCOUNTER — Ambulatory Visit: Payer: Medicare Other

## 2018-12-16 DIAGNOSIS — B20 Human immunodeficiency virus [HIV] disease: Secondary | ICD-10-CM | POA: Diagnosis not present

## 2018-12-17 ENCOUNTER — Telehealth: Payer: Self-pay | Admitting: Behavioral Health

## 2018-12-17 ENCOUNTER — Encounter (HOSPITAL_COMMUNITY): Payer: Self-pay | Admitting: Emergency Medicine

## 2018-12-17 ENCOUNTER — Emergency Department (HOSPITAL_COMMUNITY)
Admission: EM | Admit: 2018-12-17 | Discharge: 2018-12-17 | Disposition: A | Discharge status: Home / Self Care | Payer: Medicare Other | Attending: Emergency Medicine | Admitting: Emergency Medicine

## 2018-12-17 ENCOUNTER — Other Ambulatory Visit: Payer: Self-pay

## 2018-12-17 DIAGNOSIS — R739 Hyperglycemia, unspecified: Secondary | ICD-10-CM | POA: Insufficient documentation

## 2018-12-17 DIAGNOSIS — F17210 Nicotine dependence, cigarettes, uncomplicated: Secondary | ICD-10-CM | POA: Diagnosis not present

## 2018-12-17 DIAGNOSIS — Z79899 Other long term (current) drug therapy: Secondary | ICD-10-CM | POA: Insufficient documentation

## 2018-12-17 DIAGNOSIS — E119 Type 2 diabetes mellitus without complications: Secondary | ICD-10-CM | POA: Insufficient documentation

## 2018-12-17 DIAGNOSIS — Z7984 Long term (current) use of oral hypoglycemic drugs: Secondary | ICD-10-CM | POA: Insufficient documentation

## 2018-12-17 DIAGNOSIS — B20 Human immunodeficiency virus [HIV] disease: Secondary | ICD-10-CM | POA: Diagnosis not present

## 2018-12-17 DIAGNOSIS — E1165 Type 2 diabetes mellitus with hyperglycemia: Secondary | ICD-10-CM | POA: Diagnosis not present

## 2018-12-17 HISTORY — DX: Type 2 diabetes mellitus without complications: E11.9

## 2018-12-17 LAB — CBC
HCT: 49.8 % (ref 39.0–52.0)
Hemoglobin: 15.8 g/dL (ref 13.0–17.0)
MCH: 26.6 pg (ref 26.0–34.0)
MCHC: 31.7 g/dL (ref 30.0–36.0)
MCV: 83.8 fL (ref 80.0–100.0)
NRBC: 0 % (ref 0.0–0.2)
Platelets: 217 10*3/uL (ref 150–400)
RBC: 5.94 MIL/uL — AB (ref 4.22–5.81)
RDW: 15.4 % (ref 11.5–15.5)
WBC: 5.1 10*3/uL (ref 4.0–10.5)

## 2018-12-17 LAB — BASIC METABOLIC PANEL
ANION GAP: 9 (ref 5–15)
BUN: 21 mg/dL — ABNORMAL HIGH (ref 6–20)
CO2: 23 mmol/L (ref 22–32)
CREATININE: 1.12 mg/dL (ref 0.61–1.24)
Calcium: 9.2 mg/dL (ref 8.9–10.3)
Chloride: 98 mmol/L (ref 98–111)
GFR calc Af Amer: >60 mL/min (ref >60)
Glucose, Bld: 398 mg/dL — ABNORMAL HIGH (ref 70–99)
Potassium: 5.5 mmol/L — ABNORMAL HIGH (ref 3.5–5.1)
SODIUM: 130 mmol/L — AB (ref 135–145)

## 2018-12-17 LAB — URINALYSIS, ROUTINE W REFLEX MICROSCOPIC
BILIRUBIN URINE: NEGATIVE
Bacteria, UA: NONE SEEN
Glucose, UA: >=500 mg/dL — AB
HGB URINE DIPSTICK: NEGATIVE
Ketones, ur: NEGATIVE mg/dL
LEUKOCYTE UA: NEGATIVE
Nitrite: NEGATIVE
Protein, ur: NEGATIVE mg/dL
SPECIFIC GRAVITY, URINE: 1.031 — AB (ref 1.005–1.030)
pH: 6 (ref 5.0–8.0)

## 2018-12-17 LAB — CBG MONITORING, ED
GLUCOSE-CAPILLARY: 399 mg/dL — AB (ref 70–99)
GLUCOSE-CAPILLARY: 405 mg/dL — AB (ref 70–99)
Glucose-Capillary: 474 mg/dL — ABNORMAL HIGH (ref 70–99)
Glucose-Capillary: 448 mg/dL — ABNORMAL HIGH (ref 70–99)

## 2018-12-17 LAB — T-HELPER CELL (CD4) - (RCID CLINIC ONLY)
CD4 T CELL ABS: 460 /uL (ref 400–2700)
CD4 T CELL HELPER: 23 % — AB (ref 33–55)

## 2018-12-17 MED ORDER — SODIUM CHLORIDE 0.9 % IV BOLUS
1000.0000 mL | Freq: Once | INTRAVENOUS | Status: AC
  Administered 2018-12-17: 1000 mL via INTRAVENOUS

## 2018-12-17 MED ORDER — METFORMIN HCL 500 MG PO TABS
1000.0000 mg | ORAL_TABLET | Freq: Once | ORAL | Status: AC
  Administered 2018-12-17: 1000 mg via ORAL
  Filled 2018-12-17: qty 2

## 2018-12-17 MED ORDER — METFORMIN HCL 1000 MG PO TABS: 1000.0000 mg | ORAL_TABLET | Freq: Two times a day (BID) | ORAL | 0 refills | Status: DC

## 2018-12-17 NOTE — Telephone Encounter (Signed)
Alternatively he could go to [facility] for a cheaper alternative to continue his medications for another month or so.  Thank you for helping him establish primary care too - he certainly needs diabetes help.

## 2018-12-17 NOTE — ED Provider Notes (Signed)
[facility]-EMERGENCY DEPT Provider Note   CSN: [id number] Arrival date & time: 12/17/18  1628    History   Chief Complaint Chief Complaint  Patient presents with  . Hyperglycemia    HPI Zachary Hill is a 38 y.o. male with history of diabetes mellitus, HIV disease, hepatitis B, pancreatitis presents for evaluation of hyperglycemia.  He reports known history of diabetes but has not been taking metformin.  He last had metformin prescribed to him in November and has been out since December.  He had blood work performed yesterday at his infectious disease doctor's office and received a call today that he was hyperglycemic.  Reports he has been compliant with his antiretroviral regimen and his CD4 count was within normal limits based on lab work yesterday.  He reports occasionally experiencing some excessive thirst and polyuria but otherwise "I feel fine ".  He denies nausea, vomiting, fevers, chills, chest pain, shortness of breath, or abdominal pain.  He is here requesting a refill of metformin.     The history is provided by the patient.    Past Medical History:  Diagnosis Date  . Acute renal failure (HCC)   . Anemia   . Diabetes mellitus without complication (HCC)   . Hepatitis B    03/21/12 - sAg neg, sAb pos, cAb pos, indication prior infection  . HIV disease (HCC)    CD4 = 10, VL = 475K, 03/21/12.  Medication non-compliance    Patient Active Problem List   Diagnosis Date Noted  . 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  . 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 Dyspnea 05/20/2012  . Acute renal failure (HCC)   . Anemia   . 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) 05/18/2012  . Diarrhea 05/14/2012  . Pharyngitis 05/14/2012  . Acute kidney injury (HCC) 05/14/2012  . Thrombocytopenia (HCC) 05/14/2012  . Odynophagia 05/13/2012  . Fever 05/11/2012  . Nausea & vomiting 05/11/2012  . Candidal esophagitis (HCC) 04/29/2012  . Human immunodeficiency virus (HIV) disease  (HCC) 03/29/2012  . Human papillomavirus in conditions classified elsewhere and of unspecified site 03/29/2012    Past Surgical History:  Procedure Laterality Date  . COLONOSCOPY  05/15/2012   Procedure: COLONOSCOPY;  Surgeon: Shirley Friar, MD;  Location: [facility] ENDOSCOPY;  Service: Endoscopy;  Laterality: N/A;  unprepped  . ESOPHAGOGASTRODUODENOSCOPY  05/13/2012   Procedure: ESOPHAGOGASTRODUODENOSCOPY (EGD);  Surgeon: Shirley Friar, MD;  Location: [facility] ENDOSCOPY;  Service: Endoscopy;  Laterality: N/A;        Home Medications    Prior to Admission medications   Medication Sig Start Date End Date Taking? Authorizing Provider  GENVOYA 150-150-200-10 MG TABS tablet TAKE 1 TABLET BY MOUTH DAILY WITH BREAKFAST. Patient taking differently: Take 1 tablet by mouth at bedtime.  02/06/17  Yes Judyann Munson, MD  PREZISTA 800 MG tablet TAKE 1 TABLET (800 MG TOTAL) BY MOUTH DAILY. Patient taking differently: Take 800 mg by mouth at bedtime.  02/06/17  Yes Judyann Munson, MD  metFORMIN (GLUCOPHAGE) 1000 MG tablet Take 1 tablet (1,000 mg total) by mouth 2 (two) times daily. 12/17/18   Jeanie Sewer, PA-C    Family History No family history on file.  Social History Social History   Tobacco Use  . Smoking status: Current Some Day Smoker    Packs/day: 0.20    Types: Cigarettes  . Smokeless tobacco: Never Used  Substance Use Topics  . Alcohol use: Yes    Alcohol/week: 3.0 standard drinks  Types: 3 Standard drinks or equivalent per week    Comment: red wine , social   . Drug use: No     Allergies   Fruit & vegetable daily [nutritional supplements] and Bactrim [sulfamethoxazole-trimethoprim]   Review of Systems Review of Systems  Constitutional: Negative for chills and fever.  Respiratory: Negative for shortness of breath.   Cardiovascular: Negative for chest pain.  Gastrointestinal: Negative for abdominal pain, nausea and vomiting.  Endocrine: Positive for polydipsia and  polyuria.  Genitourinary: Negative for dysuria.  Neurological: Negative for syncope, light-headedness and headaches.  All other systems reviewed and are negative.    Physical Exam Updated Vital Signs BP 123/84   Pulse 83   Temp 97.9 F (36.6 C) (Oral)   Resp 20   Ht  (1.803 m)   Wt 83.9 kg   SpO2 100%   BMI 25.80 kg/m   Physical Exam Vitals signs and nursing note reviewed.  Constitutional:      General: He is not in acute distress.    Appearance: He is well-developed.  HENT:     Head: Normocephalic and atraumatic.  Eyes:     General:        Right eye: No discharge.        Left eye: No discharge.     Conjunctiva/sclera: Conjunctivae normal.  Neck:     Vascular: No JVD.     Trachea: No tracheal deviation.  Cardiovascular:     Rate and Rhythm: Normal rate and regular rhythm.  Pulmonary:     Effort: Pulmonary effort is normal.     Breath sounds: Normal breath sounds.  Abdominal:     General: Abdomen is flat. There is no distension.     Tenderness: There is no abdominal tenderness. There is no guarding or rebound.  Skin:    General: Skin is warm and dry.     Findings: No erythema.  Neurological:     General: No focal deficit present.     Mental Status: He is alert.  Psychiatric:        Behavior: Behavior normal.      ED Treatments / Results  Labs (all labs ordered are listed, but only abnormal results are displayed) Labs Reviewed  BASIC METABOLIC PANEL - Abnormal; Notable for the following components:      Result Value   Sodium 130 (*)    Potassium 5.5 (*)    Glucose, Bld 398 (*)    BUN 21 (*)    All other components within normal limits  CBC - Abnormal; Notable for the following components:   RBC 5.94 (*)    All other components within normal limits  URINALYSIS, ROUTINE W REFLEX MICROSCOPIC - Abnormal; Notable for the following components:   Specific Gravity, Urine 1.031 (*)    Glucose, UA >=500 (*)    All other components within normal limits   CBG MONITORING, ED - Abnormal; Notable for the following components:   Glucose-Capillary 405 (*)    All other components within normal limits  CBG MONITORING, ED - Abnormal; Notable for the following components:   Glucose-Capillary 399 (*)    All other components within normal limits  CBG MONITORING, ED - Abnormal; Notable for the following components:   Glucose-Capillary 474 (*)    All other components within normal limits  CBG MONITORING, ED - Abnormal; Notable for the following components:   Glucose-Capillary 448 (*)    All other components within normal limits    EKG None  Radiology No results found.  Procedures Procedures (including critical care time)  Medications Ordered in ED Medications  sodium chloride 0.9 % bolus 1,000 mL (0 mLs Intravenous Stopped 12/17/18 1931)  sodium chloride 0.9 % bolus 1,000 mL (0 mLs Intravenous Stopped 12/17/18 2055)  metFORMIN (GLUCOPHAGE) tablet 1,000 mg (1,000 mg Oral Given 12/17/18 2054)     Initial Impression / Assessment and Plan / ED Course  I have reviewed the triage vital signs and the nursing notes.  Pertinent labs & imaging results that were available during my care of the patient were reviewed by me and considered in my medical decision making (see chart for details).        Patient presenting requesting refills of metformin.  He has been out of this since December.  He is afebrile, vital signs are stable.  He is nontoxic in appearance.  He is compliant with his antiretrovirals and his CD4 count was normal based on lab work yesterday.  In the ED he is hyperglycemic.  He does not appear to be in DKA as he has no ketones in his urine and his anion gap is within normal limits.  Reported sodium is 130 but corrected sodium is 135.  He received 2 L of fluid in the ED and metformin.  No evidence of endorgan damage.  We will refill his metformin for 1 month.  He is in the process of establishing care with a PCP here in [place] as he  recently moved from [place].  Tolerating p.o. without difficulty in the ED. Discussed strict ED return precautions. Pt verbalized understanding of and agreement with plan and is safe for discharge home at this time.  Final Clinical Impressions(s) / ED Diagnoses   Final diagnoses:  Hyperglycemia    ED Discharge Orders         Ordered    metFORMIN (GLUCOPHAGE) 1000 MG tablet  2 times daily     12/17/18 2040           Bennye Alm 12/17/18 2352    Linwood Dibbles, MD 12/18/18 [phone]

## 2018-12-17 NOTE — Discharge Instructions (Signed)
Start taking your metformin as prescribed.  Drink plenty of water.  Monitor your blood sugars at home if you are able to.  Follow-up with a primary care physician for reevaluation of your diabetes.  I have attached information for the primary care at [facility] office as well as a packet of primary care offices in the area that you can follow-up with.  Return to the emergency department if any concerning signs or symptoms develop such as altered mental status, headaches, persistent vomiting, fevers, chest pain, or shortness of breath.

## 2018-12-17 NOTE — Telephone Encounter (Signed)
[facility] called critical Lab results  Glucose 596 on CMP from 12/16/2018.  Called patient he states he feels ok this morning.  He states he ate a nutrigrain bar and a coffee drink from [place] before lab work was drawn.    However patient states he tried to establish care with a Primary Care doctor in [place] and they wanted him to go to Diabetes Education classes.  Patient states he did not have time to do the Diabetes Education so they stopped his medication.  Advised patient to go to Urgent care today and explained the importance of getting his blood sugar under control ASAP.  Patient states he would go to Urgent Care today and call his primary care provider today to re-establish care.  Angeline Slim RN

## 2018-12-17 NOTE — ED Triage Notes (Signed)
Pt reports that been out of Metformin since December. reports went for blood work yesterday and called today saying blood sugar is high.

## 2018-12-20 ENCOUNTER — Ambulatory Visit: Payer: Medicare Other | Admitting: Family

## 2018-12-20 LAB — COMPLETE METABOLIC PANEL WITH GFR
AG Ratio: 1.4 (calc) (ref 1.0–2.5)
ALT: 14 U/L (ref 9–46)
AST: 31 U/L (ref 10–40)
Albumin: 4.2 g/dL (ref 3.6–5.1)
Alkaline phosphatase (APISO): 94 U/L (ref 36–130)
BUN: 17 mg/dL (ref 7–25)
CALCIUM: 10.1 mg/dL (ref 8.6–10.3)
CO2: 22 mmol/L (ref 20–32)
Chloride: 99 mmol/L (ref 98–110)
Creat: 1.24 mg/dL (ref 0.60–1.35)
GFR, EST NON AFRICAN AMERICAN: 73 mL/min/{1.73_m2} (ref >=60)
GFR, Est African American: 85 mL/min/{1.73_m2} (ref >=60)
GLUCOSE: 596 mg/dL — AB (ref 65–99)
Globulin: 3 g/dL (calc) (ref 1.9–3.7)
Potassium: 5.1 mmol/L (ref 3.5–5.3)
Sodium: 134 mmol/L — ABNORMAL LOW (ref 135–146)
Total Bilirubin: 0.3 mg/dL (ref 0.2–1.2)
Total Protein: 7.2 g/dL (ref 6.1–8.1)

## 2018-12-20 LAB — CBC WITH DIFFERENTIAL/PLATELET
Absolute Monocytes: 396 cells/uL (ref 200–950)
Basophils Absolute: 31 cells/uL (ref 0–200)
Basophils Relative: 0.7 %
EOS ABS: 132 {cells}/uL (ref 15–500)
Eosinophils Relative: 3 %
HEMATOCRIT: 44.9 % (ref 38.5–50.0)
Hemoglobin: 14.9 g/dL (ref 13.2–17.1)
LYMPHS ABS: 1852 {cells}/uL (ref 850–3900)
MCH: 27.2 pg (ref 27.0–33.0)
MCHC: 33.2 g/dL (ref 32.0–36.0)
MCV: 81.9 fL (ref 80.0–100.0)
MONOS PCT: 9 %
MPV: 13.2 fL — AB (ref 7.5–12.5)
NEUTROS ABS: 1989 {cells}/uL (ref 1500–7800)
Neutrophils Relative %: 45.2 %
Platelets: 222 10*3/uL (ref 140–400)
RBC: 5.48 10*6/uL (ref 4.20–5.80)
RDW: 14.4 % (ref 11.0–15.0)
Total Lymphocyte: 42.1 %
WBC: 4.4 10*3/uL (ref 3.8–10.8)

## 2018-12-20 LAB — RPR: RPR Ser Ql: REACTIVE — AB

## 2018-12-20 LAB — RPR TITER: RPR Titer: 1:8 {titer} — ABNORMAL HIGH

## 2018-12-20 LAB — HIV RNA, RTPCR W/R GT (RTI, PI,INT)
HIV 1 RNA Quant: 114 copies/mL — ABNORMAL HIGH
HIV-1 RNA QUANT, LOG: 2.06 {Log_copies}/mL — AB

## 2018-12-20 LAB — FLUORESCENT TREPONEMAL AB(FTA)-IGG-BLD: Fluorescent Treponemal ABS: REACTIVE — AB

## 2019-01-07 ENCOUNTER — Encounter: Payer: Self-pay | Admitting: Internal Medicine

## 2019-01-08 ENCOUNTER — Telehealth: Payer: Self-pay | Admitting: Family

## 2019-01-08 NOTE — Telephone Encounter (Signed)
COVID-19 Pre-Screening Questions: ° °Do you currently have a fever (>100 °F), chills or unexplained body aches? No  ° °Are you currently experiencing new cough, shortness of breath, sore throat, runny nose? No  °•  °Have you recently travelled outside the state of [place] in the last 14 days? No  °•  °Have you been in contact with someone that is currently pending confirmation of Covid19 testing or has been confirmed to have the Covid19 virus?  No  °

## 2019-01-09 ENCOUNTER — Other Ambulatory Visit: Payer: Self-pay

## 2019-01-09 ENCOUNTER — Ambulatory Visit (INDEPENDENT_AMBULATORY_CARE_PROVIDER_SITE_OTHER): Payer: Medicare Other | Admitting: Family

## 2019-01-09 ENCOUNTER — Telehealth: Payer: Self-pay | Admitting: Pharmacy Technician

## 2019-01-09 ENCOUNTER — Encounter: Payer: Self-pay | Admitting: Family

## 2019-01-09 ENCOUNTER — Other Ambulatory Visit: Payer: Self-pay | Admitting: Family

## 2019-01-09 VITALS — BP 138/83 | HR 87 | Temp 97.9°F | Wt 184.0 lb

## 2019-01-09 DIAGNOSIS — E1165 Type 2 diabetes mellitus with hyperglycemia: Secondary | ICD-10-CM

## 2019-01-09 DIAGNOSIS — B20 Human immunodeficiency virus [HIV] disease: Secondary | ICD-10-CM

## 2019-01-09 DIAGNOSIS — Z23 Encounter for immunization: Secondary | ICD-10-CM

## 2019-01-09 MED ORDER — DARUNAVIR ETHANOLATE 800 MG PO TABS: ORAL_TABLET | ORAL | 3 refills | Status: DC

## 2019-01-09 MED ORDER — METFORMIN HCL 1000 MG PO TABS: 1000.0000 mg | ORAL_TABLET | Freq: Two times a day (BID) | ORAL | 3 refills | Status: DC

## 2019-01-09 MED ORDER — ELVITEG-COBIC-EMTRICIT-TENOFAF 150-150-200-10 MG PO TABS: 1.0000 | ORAL_TABLET | Freq: Every day | ORAL | 3 refills | Status: DC

## 2019-01-09 NOTE — Assessment & Plan Note (Signed)
Zachary Hill has recently had poorly controlled diabetes with blood sugar in the 500's. He is currently taking metformin without adverse side effects and is asymptomatic at present. There is no evidence of end organ damage at present. Will check A1c today. Discussed importance of decreasing carbohydrate intake with goal of <100 grams per day for now. He will check on insurance coverage for glucometer and strips.

## 2019-01-09 NOTE — Assessment & Plan Note (Signed)
Zachary Hill appears to have adequately controlled HIV disease with most recent viral load of 114. He has tolerated his Genvoya and Prezista with no adverse side effects and currently has no signs/symptoms of opportunistic infection or progressive HIV disease at present. Unable to find any genotypes with resistance patterns. Will continue current dose of Genovya and Prezista for now may consider switching to [place] or Symtuza in the near future. Plan for follow up in 3 months or sooner if needed with lab work prior to appointment.

## 2019-01-09 NOTE — Telephone Encounter (Signed)
[facility] Patient Product/process development scientist completed.    The patient in insured through [facility] and has a $3.90 copay.  We will continue to follow to see if copay assistance is needed.  Netty Starring. Dimas Aguas CPhT Specialty Pharmacy Patient [facility] for Infectious Disease Phone: [phone] Fax:  [phone]

## 2019-01-09 NOTE — Progress Notes (Signed)
Subjective:    Patient ID: Zachary Hill, male    DOB: Oct 08, 1980, 38 y.o.   MRN: [id number]  Chief Complaint  Patient presents with   HIV Positive/AIDS   Diabetes     HPI:  Zachary Hill is a 38 y.o. male who presents today to re-engage in care for HIV disease and diabetes.  Zachary Hill was last seen in the office on 02/10/16 for routine follow up with good adherence and tolerance to his ART regimen of Genvoya and Darunivir. Zachary Hill recently completed blood work was noted to have a blood sugar level of 596.  He was seen at urgent care and provided a 30-day supply of metformin with goal of obtaining primary care.  Zachary Hill has been taking his Genvoya and Prezista as prescribed until approximately 1 week ago when he ran out of Prezista.  He was advised to stop taking Genvoya as well and has not been on medication since that time. Overall he feels well today. Denies fevers, chills, night sweats, headaches, changes in vision, neck pain/stiffness, nausea, diarrhea, vomiting, lesions or rashes.  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 He remains covered through [facility] and has not had any problems obtaining his medications. Not currently sexually active. Declines feeling down, hopeless or depressed in the last 2 weeks. No recreational or illicit drug use.  Zachary Hill continues to take metformin for his diabetes with no adverse side effects or missed doses. He does not have any symptoms of blurred vision, excessive hunger, excessive thirst or excessive urination. No numbness or tingling and any extremity.   Allergies  Allergen Reactions   Fruit & Vegetable Daily [Nutritional Supplements]     Allergic to RAW fruits and vegetables .   Bactrim [Sulfamethoxazole-Trimethoprim] Itching, Rash and Other (See Comments)    "Flu like symptoms"       Outpatient Medications Prior  to Visit  Medication Sig Dispense Refill   metFORMIN (GLUCOPHAGE) 1000 MG tablet Take 1 tablet (1,000 mg total) by mouth 2 (two) times daily. 60 tablet 0   GENVOYA 150-150-200-10 MG TABS tablet TAKE 1 TABLET BY MOUTH DAILY WITH BREAKFAST. (Patient not taking: Reported on 01/09/2019) 30 tablet 5   PREZISTA 800 MG tablet TAKE 1 TABLET (800 MG TOTAL) BY MOUTH DAILY. (Patient not taking: No sig reported) 30 tablet 5   No facility-administered medications prior to visit.      Past Medical History:  Diagnosis Date   Acute renal failure (HCC)    Anemia    Diabetes mellitus without complication (HCC)    Hepatitis B    03/21/12 - sAg neg, sAb pos, cAb pos, indication prior infection   HIV disease (HCC)    CD4 = 10, VL = 475K, 03/21/12.  Medication non-compliance     Past Surgical History:  Procedure Laterality Date   COLONOSCOPY  05/15/2012   Procedure: COLONOSCOPY;  Surgeon: Shirley Friar, MD;  Location: [facility] ENDOSCOPY;  Service: Endoscopy;  Laterality: N/A;  unprepped   ESOPHAGOGASTRODUODENOSCOPY  05/13/2012   Procedure: ESOPHAGOGASTRODUODENOSCOPY (EGD);  Surgeon: Shirley Friar, MD;  Location: [facility] ENDOSCOPY;  Service: Endoscopy;  Laterality: N/A;   Review of Systems  Constitutional: Negative for appetite change, chills, fatigue, fever and unexpected weight change.  Eyes: Negative for visual disturbance.       Negative for changes in vision.  Respiratory: Negative for cough, chest tightness, shortness  of breath and wheezing.   Cardiovascular: Negative for chest pain, palpitations and leg swelling.  Gastrointestinal: Negative for abdominal pain, constipation, diarrhea, nausea and vomiting.  Endocrine: Negative for polydipsia, polyphagia and polyuria.  Genitourinary: Negative for dysuria, flank pain, frequency, genital sores, hematuria and urgency.  Skin: Negative for rash.  Allergic/Immunologic: Negative for immunocompromised state.  Neurological: Negative for dizziness,  weakness, light-headedness and headaches.      Objective:    BP 138/83    Pulse 87    Temp 97.9 F (36.6 C)    Wt 184 lb (83.5 kg)    BMI 25.66 kg/m  Nursing note and vital signs reviewed.  Physical Exam Constitutional:      General: He is not in acute distress.    Appearance: He is well-developed.  Cardiovascular:     Rate and Rhythm: Normal rate and regular rhythm.     Heart sounds: Normal heart sounds.  Pulmonary:     Effort: Pulmonary effort is normal.     Breath sounds: Normal breath sounds.  Skin:    General: Skin is warm and dry.  Neurological:     Mental Status: He is alert.  Psychiatric:        Mood and Affect: Mood normal.       Assessment & Plan:   Problem List Items Addressed This Visit      Endocrine   Type 2 diabetes mellitus with hyperglycemia, without long-term current use of insulin [facility])    Zachary Hill has recently had poorly controlled diabetes with blood sugar in the 500's. He is currently taking metformin without adverse side effects and is asymptomatic at present. There is no evidence of end organ damage at present. Will check A1c today. Discussed importance of decreasing carbohydrate intake with goal of <100 grams per day for now. He will check on insurance coverage for glucometer and strips.       Relevant Medications   metFORMIN (GLUCOPHAGE) 1000 MG tablet   Other Relevant Orders   HgB A1c     Other   Human immunodeficiency virus (HIV) disease (HCC) - Primary    Zachary Hill appears to have adequately controlled HIV disease with most recent viral load of 114. He has tolerated his Genvoya and Prezista with no adverse side effects and currently has no signs/symptoms of opportunistic infection or progressive HIV disease at present. Unable to find any genotypes with resistance patterns. Will continue current dose of Genovya and Prezista for now may consider switching to Lodge or Symtuza in the near future. Plan for follow up in 3 months or sooner if  needed with lab work prior to appointment.      Relevant Medications   darunavir (PREZISTA) 800 MG tablet   elvitegravir-cobicistat-emtricitabine-tenofovir (GENVOYA) 150-150-200-10 MG TABS tablet   Other Relevant Orders   HIV-1 RNA ultraquant reflex to gentyp+   Pneumococcal conjugate vaccine 13-valent IM (Completed)   MENINGOCOCCAL MCV4O (Completed)    Other Visit Diagnoses    Need for pneumococcal vaccination       Relevant Orders   Pneumococcal conjugate vaccine 13-valent IM (Completed)   Need for meningococcal vaccination       Relevant Orders   MENINGOCOCCAL MCV4O (Completed)       I have changed Kia Leser's Prezista to darunavir and Genvoya to elvitegravir-cobicistat-emtricitabine-tenofovir. I am also having him maintain his metFORMIN.   Meds ordered this encounter  Medications   darunavir (PREZISTA) 800 MG tablet    Sig: TAKE 1 TABLET (800 MG  TOTAL) BY MOUTH DAILY.    Dispense:  30 tablet    Refill:  3    Order Specific Question:   Supervising Provider    Answer:   Judyann MunsonSNIDER, CYNTHIA [[id number]]   elvitegravir-cobicistat-emtricitabine-tenofovir (GENVOYA) 150-150-200-10 MG TABS tablet    Sig: Take 1 tablet by mouth daily with breakfast.    Dispense:  30 tablet    Refill:  3    Order Specific Question:   Supervising Provider    Answer:   Drue SecondSNIDER, CYNTHIA [[id number]]   metFORMIN (GLUCOPHAGE) 1000 MG tablet    Sig: Take 1 tablet (1,000 mg total) by mouth 2 (two) times daily.    Dispense:  60 tablet    Refill:  3    Order Specific Question:   Supervising Provider    Answer:   Judyann MunsonSNIDER, CYNTHIA [[id number]]     Follow-up: Return in about 3 months (around 04/10/2019), or if symptoms worsen or fail to improve.   Marcos EkeGreg Tonnya Garbett, MSN, FNP-C Nurse Practitioner [facility] Main number: [phone]

## 2019-01-09 NOTE — Patient Instructions (Signed)
Nice to meet you.  We will check your blood work today.  Restart taking your Genvoya and Prezista  Continue to take metformin,  Check with pharmacy for covered meters and strips.   [email or web address]   The diabetes code - Dr. Wylene Simmer.  Plan for follow up in 3 months or sooner if needed.

## 2019-01-10 LAB — HEMOGLOBIN A1C: Hgb A1c MFr Bld: >14 % of total Hgb — ABNORMAL HIGH (ref <5.7)

## 2019-01-30 ENCOUNTER — Encounter: Payer: Medicare Other | Admitting: Internal Medicine

## 2019-02-05 ENCOUNTER — Encounter: Payer: Medicare Other | Admitting: Internal Medicine

## 2019-04-10 ENCOUNTER — Other Ambulatory Visit (HOSPITAL_COMMUNITY)
Admission: RE | Admit: 2019-04-10 | Discharge: 2019-04-10 | Disposition: A | Discharge status: Home / Self Care | Payer: Medicare Other | Source: Ambulatory Visit | Attending: Internal Medicine | Admitting: Internal Medicine

## 2019-04-10 ENCOUNTER — Other Ambulatory Visit: Payer: Self-pay

## 2019-04-10 ENCOUNTER — Other Ambulatory Visit: Payer: Medicare Other

## 2019-04-10 DIAGNOSIS — B20 Human immunodeficiency virus [HIV] disease: Secondary | ICD-10-CM

## 2019-04-11 LAB — URINE CYTOLOGY ANCILLARY ONLY
Chlamydia: NEGATIVE
Neisseria Gonorrhea: NEGATIVE

## 2019-04-11 LAB — T-HELPER CELL (CD4) - (RCID CLINIC ONLY)
CD4 % Helper T Cell: 25 % — ABNORMAL LOW (ref 33–65)
CD4 T Cell Abs: 461 /uL (ref 400–1790)

## 2019-04-16 LAB — COMPLETE METABOLIC PANEL WITH GFR
AG Ratio: 1.4 (calc) (ref 1.0–2.5)
ALT: 13 U/L (ref 9–46)
AST: 13 U/L (ref 10–40)
Albumin: 3.9 g/dL (ref 3.6–5.1)
Alkaline phosphatase (APISO): 80 U/L (ref 36–130)
BUN: 15 mg/dL (ref 7–25)
CO2: 23 mmol/L (ref 20–32)
Calcium: 9.7 mg/dL (ref 8.6–10.3)
Chloride: 101 mmol/L (ref 98–110)
Creat: 0.93 mg/dL (ref 0.60–1.35)
GFR, Est African American: 120 mL/min/{1.73_m2} (ref >=60)
GFR, Est Non African American: 104 mL/min/{1.73_m2} (ref >=60)
Globulin: 2.8 g/dL (calc) (ref 1.9–3.7)
Glucose, Bld: 333 mg/dL — ABNORMAL HIGH (ref 65–99)
Potassium: 4.6 mmol/L (ref 3.5–5.3)
Sodium: 133 mmol/L — ABNORMAL LOW (ref 135–146)
Total Bilirubin: 0.4 mg/dL (ref 0.2–1.2)
Total Protein: 6.7 g/dL (ref 6.1–8.1)

## 2019-04-16 LAB — CBC WITH DIFFERENTIAL/PLATELET
Absolute Monocytes: 426 cells/uL (ref 200–950)
Basophils Absolute: 22 cells/uL (ref 0–200)
Basophils Relative: 0.4 %
Eosinophils Absolute: 207 cells/uL (ref 15–500)
Eosinophils Relative: 3.7 %
HCT: 42.6 % (ref 38.5–50.0)
Hemoglobin: 13.9 g/dL (ref 13.2–17.1)
Lymphs Abs: 1994 cells/uL (ref 850–3900)
MCH: 26.1 pg — ABNORMAL LOW (ref 27.0–33.0)
MCHC: 32.6 g/dL (ref 32.0–36.0)
MCV: 79.9 fL — ABNORMAL LOW (ref 80.0–100.0)
MPV: 12 fL (ref 7.5–12.5)
Monocytes Relative: 7.6 %
Neutro Abs: 2951 cells/uL (ref 1500–7800)
Neutrophils Relative %: 52.7 %
Platelets: 217 10*3/uL (ref 140–400)
RBC: 5.33 10*6/uL (ref 4.20–5.80)
RDW: 14.7 % (ref 11.0–15.0)
Total Lymphocyte: 35.6 %
WBC: 5.6 10*3/uL (ref 3.8–10.8)

## 2019-04-16 LAB — HIV-1 RNA QUANT-NO REFLEX-BLD
HIV 1 RNA Quant: 67 copies/mL — ABNORMAL HIGH
HIV-1 RNA Quant, Log: 1.83 Log copies/mL — ABNORMAL HIGH

## 2019-05-01 ENCOUNTER — Telehealth: Payer: Self-pay | Admitting: Family

## 2019-05-01 NOTE — Telephone Encounter (Signed)
COVID-19 Pre-Screening Questions: ° °Do you currently have a fever (>100 °F), chills or unexplained body aches? N ° °Are you currently experiencing new cough, shortness of breath, sore throat, runny nose?N °•  °Have you recently travelled outside the state of [place] in the last 14 days? N °•  °Have you been in contact with someone that is currently pending confirmation of Covid19 testing or has been confirmed to have the Covid19 virus?  N ° °**If the patient answers NO to ALL questions -  advise the patient to please call the clinic before coming to the office should any symptoms develop.  ° °1.  ° °

## 2019-05-05 ENCOUNTER — Encounter: Payer: Medicare Other | Admitting: Family

## 2019-05-06 ENCOUNTER — Telehealth: Payer: Self-pay | Admitting: Family

## 2019-05-06 NOTE — Telephone Encounter (Signed)
COVID-19 Pre-Screening Questions: ° °Do you currently have a fever (>100 °F), chills or unexplained body aches? N ° °Are you currently experiencing new cough, shortness of breath, sore throat, runny nose? N °•  °Have you recently travelled outside the state of [place] in the last 14 days? N  °•  °Have you been in contact with someone that is currently pending confirmation of Covid19 testing or has been confirmed to have the Covid19 virus?  N ° °**If the patient answers NO to ALL questions -  advise the patient to please call the clinic before coming to the office should any symptoms develop.  ° ° ° °

## 2019-05-07 ENCOUNTER — Encounter: Payer: Self-pay | Admitting: Family

## 2019-05-07 ENCOUNTER — Other Ambulatory Visit: Payer: Self-pay

## 2019-05-07 ENCOUNTER — Ambulatory Visit (INDEPENDENT_AMBULATORY_CARE_PROVIDER_SITE_OTHER): Payer: Medicare Other | Admitting: Family

## 2019-05-07 VITALS — BP 134/87 | HR 98 | Temp 98.7°F | Wt 188.8 lb

## 2019-05-07 DIAGNOSIS — B20 Human immunodeficiency virus [HIV] disease: Secondary | ICD-10-CM | POA: Diagnosis not present

## 2019-05-07 DIAGNOSIS — Z0000 Encounter for general adult medical examination without abnormal findings: Secondary | ICD-10-CM

## 2019-05-07 DIAGNOSIS — Z23 Encounter for immunization: Secondary | ICD-10-CM

## 2019-05-07 DIAGNOSIS — E1165 Type 2 diabetes mellitus with hyperglycemia: Secondary | ICD-10-CM

## 2019-05-07 DIAGNOSIS — Z113 Encounter for screening for infections with a predominantly sexual mode of transmission: Secondary | ICD-10-CM | POA: Diagnosis not present

## 2019-05-07 MED ORDER — DARUNAVIR ETHANOLATE 800 MG PO TABS: ORAL_TABLET | ORAL | 3 refills | Status: DC

## 2019-05-07 MED ORDER — GENVOYA 150-150-200-10 MG PO TABS: 1.0000 | ORAL_TABLET | Freq: Every day | ORAL | 3 refills | Status: DC

## 2019-05-07 NOTE — Assessment & Plan Note (Signed)
Mr. Zachary Hill has well-controlled HIV disease with good adherence and tolerance to his ART regimen of Genvoya and Prezista.  Viral load improved to 56 with most recent blood work.  No signs/symptoms of opportunistic infection or progressive HIV disease at present.  He has no problems obtaining his medication from the pharmacy.  Continue current dose of Genvoya and Prezista.  Plan for follow-up in 4 months or sooner if needed with lab work 1 to 2 weeks prior to appointment.

## 2019-05-07 NOTE — Assessment & Plan Note (Signed)
   Second dose of Menveo updated today.  Discussed importance of safe sexual practice to reduce risk of acquisition/transmission of STI.  Declines condoms.

## 2019-05-07 NOTE — Patient Instructions (Signed)
Nice to see you!  Please continue to take your Genvoya and Jud.   Please schedule a flu shot for September.  Refills of medication have been sent to the pharmacy.  We will get you referred to primary care.   We will plan to follow up in 4 months or sooner if needed with lab work 1-2 weeks prior to your appointment.

## 2019-05-07 NOTE — Assessment & Plan Note (Signed)
Type 2 diabetes remains poorly controlled with most recent random blood sugar of 333.  Not currently checking his blood sugars at home and needing strips.  He will contact our office with information regarding the type of strips that he needs.  Refer to primary care for further evaluation and treatment of diabetes.  We discussed the importance of lowering blood sugar to reduce risk of cardiovascular, renal, and neurological damage in the future.  Continue current dose of metformin pending referral to primary care.

## 2019-05-07 NOTE — Progress Notes (Signed)
Subjective:    Patient ID: Zachary Hill, male    DOB: 28-Apr-1981, 38 y.o.   MRN: [id number]  Chief Complaint  Patient presents with  . HIV Positive/AIDS    HPI:  Zachary Hill is a 38 y.o. male with HIV disease and previous history of CMV pancreatitis/CMV disease last seen in the office on 01/29/2019 for routine follow-up with adequately controlled HIV disease with a viral load of 114 on his ART regimen of Genvoya and Prezista.  Most recent blood work completed on 04/10/2019 with a viral load of 67 and undetectable and CD4 count of 461.  Renal function, hepatic function, and electrolytes within normal ranges.  Glucose significantly elevated at 333.  Due for second dose of Menveo.  Zachary Hill continues to take his Prezista and Zachary Hill as prescribed no adverse side effects or missed doses.  Overall feeling well today with no new concerns. Denies fevers, chills, night sweats, headaches, changes in vision, neck pain/stiffness, nausea, diarrhea, vomiting, lesions or rashes.  Zachary Hill continues to have coverage through Medicare and has no problems obtaining his medication from the pharmacy.  Denies feelings of being down, depressed, or hopeless recently.  He consumes alcohol on occasion and continues to smoke approximately one third a pack of cigarettes per day.  No recreational or illicit drug use.  Not currently sexually active but does use condoms when he is.  Recently separated from his husband and would like to speak with our counselor if possible.    Allergies  Allergen Reactions  . Fruit & Vegetable Daily [Nutritional Supplements]     Allergic to RAW fruits and vegetables .  Marland Kitchen. Bactrim [Sulfamethoxazole-Trimethoprim] Itching, Rash and Other (See Comments)    "Flu like symptoms"       Outpatient Medications Prior to Visit  Medication Sig Dispense Refill  . metFORMIN (GLUCOPHAGE) 1000 MG tablet Take 1 tablet (1,000 mg total) by mouth 2 (two) times daily. 60 tablet 3  . darunavir  (PREZISTA) 800 MG tablet TAKE 1 TABLET (800 MG TOTAL) BY MOUTH DAILY. 30 tablet 3  . elvitegravir-cobicistat-emtricitabine-tenofovir (GENVOYA) 150-150-200-10 MG TABS tablet Take 1 tablet by mouth daily with breakfast. 30 tablet 3   No facility-administered medications prior to visit.      Past Medical History:  Diagnosis Date  . Acute renal failure (HCC)   . Anemia   . Diabetes mellitus without complication (HCC)   . Hepatitis B    03/21/12 - sAg neg, sAb pos, cAb pos, indication prior infection  . HIV disease (HCC)    CD4 = 10, VL = 475K, 03/21/12.  Medication non-compliance     Past Surgical History:  Procedure Laterality Date  . COLONOSCOPY  05/15/2012   Procedure: COLONOSCOPY;  Surgeon: Shirley FriarVincent C. Schooler, MD;  [facility];  Service: Endoscopy;  Laterality: N/A;  unprepped  . ESOPHAGOGASTRODUODENOSCOPY  05/13/2012   Procedure: ESOPHAGOGASTRODUODENOSCOPY (EGD);  Surgeon: Shirley FriarVincent C. Schooler, MD;  [facility];  Service: Endoscopy;  Laterality: N/A;       Review of Systems  Constitutional: Negative for appetite change, chills, fatigue, fever and unexpected weight change.  Eyes: Negative for visual disturbance.  Respiratory: Negative for cough, chest tightness, shortness of breath and wheezing.   Cardiovascular: Negative for chest pain and leg swelling.  Gastrointestinal: Negative for abdominal pain, constipation, diarrhea, nausea and vomiting.  Genitourinary: Negative for dysuria, flank pain, frequency, genital sores, hematuria and urgency.  Skin: Negative for rash.  Allergic/Immunologic: Negative for immunocompromised state.  Neurological: Negative  for dizziness and headaches.      Objective:    BP 134/87   Pulse 98   Temp 98.7 F (37.1 C) (Oral)   Wt 188 lb 12.8 oz (85.6 kg)   BMI 26.33 kg/m  Nursing note and vital signs reviewed.  Physical Exam Constitutional:      General: He is not in acute distress.    Appearance: He is well-developed.   Eyes:     Conjunctiva/sclera: Conjunctivae normal.  Neck:     Musculoskeletal: Neck supple.  Cardiovascular:     Rate and Rhythm: Normal rate and regular rhythm.     Heart sounds: Normal heart sounds. No murmur. No friction rub. No gallop.   Pulmonary:     Effort: Pulmonary effort is normal. No respiratory distress.     Breath sounds: Normal breath sounds. No wheezing or rales.  Chest:     Chest wall: No tenderness.  Abdominal:     General: Bowel sounds are normal.     Palpations: Abdomen is soft.     Tenderness: There is no abdominal tenderness.  Lymphadenopathy:     Cervical: No cervical adenopathy.  Skin:    General: Skin is warm and dry.     Findings: No rash.  Neurological:     Mental Status: He is alert and oriented to person, place, and time.  Psychiatric:        Behavior: Behavior normal.        Thought Content: Thought content normal.        Judgment: Judgment normal.     [facility] 2[facility] 05/07/2019 05/07/2019 02/11/2015 11/06/2013 06/03/2013  Decreased Interest 0 0 0 0 0  Down, Depressed, Hopeless 1 0 0 0 0  PHQ - 2 Score 1 0 0 0 0       Assessment & Plan:   Problem List Items Addressed This Visit      Endocrine   Type 2 diabetes mellitus with hyperglycemia, without long-term current use of insulin (HCC)    Type 2 diabetes remains poorly controlled with most recent random blood sugar of 333.  Not currently checking his blood sugars at home and needing strips.  He will contact our office with information regarding the type of strips that he needs.  Refer to primary care for further evaluation and treatment of diabetes.  We discussed the importance of lowering blood sugar to reduce risk of cardiovascular, renal, and neurological damage in the future.  Continue current dose of metformin pending referral to primary care.        Other   Human immunodeficiency virus (HIV) disease (HCC) - Primary    Zachary Hill has well-controlled HIV disease with good adherence  and tolerance to his ART regimen of Genvoya and Prezista.  Viral load improved to 56 with most recent blood work.  No signs/symptoms of opportunistic infection or progressive HIV disease at present.  He has no problems obtaining his medication from the pharmacy.  Continue current dose of Genvoya and Prezista.  Plan for follow-up in 4 months or sooner if needed with lab work 1 to 2 weeks prior to appointment.      Relevant Medications   elvitegravir-cobicistat-emtricitabine-tenofovir (GENVOYA) 150-150-200-10 MG TABS tablet   darunavir (PREZISTA) 800 MG tablet   Other Relevant Orders   T-helper cell (CD4)- ([facility] clinic only)   HIV-1 RNA quant-no reflex-bld   CBC   Comprehensive metabolic panel   T-helper cell (CD4)- ([facility] clinic only)   HIV-1 RNA quant-no  reflex-bld   Comprehensive metabolic panel   CBC   Healthcare maintenance     Second dose of Menveo updated today.  Discussed importance of safe sexual practice to reduce risk of acquisition/transmission of STI.  Declines condoms.      Relevant Orders   MENINGOCOCCAL MCV4O (Completed)    Other Visit Diagnoses    Screening for STDs (sexually transmitted diseases)       Relevant Orders   RPR   RPR   Need for meningococcal vaccination       Relevant Orders   MENINGOCOCCAL MCV4O (Completed)       I am having Roxy Horseman maintain his metFORMIN, Genvoya, and darunavir.   Meds ordered this encounter  Medications  . elvitegravir-cobicistat-emtricitabine-tenofovir (GENVOYA) 150-150-200-10 MG TABS tablet    Sig: Take 1 tablet by mouth daily with breakfast.    Dispense:  30 tablet    Refill:  3    Order Specific Question:   Supervising Provider    Answer:   Carlyle Basques [[id number]]  . darunavir (PREZISTA) 800 MG tablet    Sig: TAKE 1 TABLET (800 MG TOTAL) BY MOUTH DAILY.    Dispense:  30 tablet    Refill:  3    Order Specific Question:   Supervising Provider    Answer:   Carlyle Basques [[id number]]     Follow-up: Return in  about 4 months (around 09/06/2019), or if symptoms worsen or fail to improve.   Terri Piedra, MSN, FNP-C Nurse Practitioner [facility] for Infectious Disease [place] number: [phone]

## 2019-05-20 ENCOUNTER — Other Ambulatory Visit: Payer: Self-pay | Admitting: Family

## 2019-05-20 DIAGNOSIS — E1165 Type 2 diabetes mellitus with hyperglycemia: Secondary | ICD-10-CM

## 2019-05-22 ENCOUNTER — Ambulatory Visit: Payer: Medicare Other

## 2019-08-12 ENCOUNTER — Other Ambulatory Visit: Payer: Self-pay | Admitting: Family

## 2019-08-12 DIAGNOSIS — B20 Human immunodeficiency virus [HIV] disease: Secondary | ICD-10-CM

## 2019-08-12 DIAGNOSIS — E1165 Type 2 diabetes mellitus with hyperglycemia: Secondary | ICD-10-CM

## 2019-08-12 MED ORDER — GENVOYA 150-150-200-10 MG PO TABS: 1.0000 | ORAL_TABLET | Freq: Every day | ORAL | 3 refills | Status: AC

## 2019-08-12 MED ORDER — DARUNAVIR ETHANOLATE 800 MG PO TABS: ORAL_TABLET | ORAL | 3 refills | Status: AC

## 2019-09-03 ENCOUNTER — Other Ambulatory Visit: Payer: Medicare Other

## 2019-09-24 ENCOUNTER — Encounter: Payer: Medicare Other | Admitting: Family

## 2019-10-21 ENCOUNTER — Other Ambulatory Visit: Payer: Self-pay | Admitting: Family

## 2019-10-21 ENCOUNTER — Telehealth: Payer: Self-pay

## 2019-10-21 DIAGNOSIS — E1165 Type 2 diabetes mellitus with hyperglycemia: Secondary | ICD-10-CM

## 2019-10-21 NOTE — Telephone Encounter (Signed)
Received Request received via interface.     Requested Prescriptions   Name from pharmacy: METFORMIN HCL 1,000 MG TABLET     Will file in chart as: metFORMIN (GLUCOPHAGE) 1000 MG tablet  Sig: TAKE 1 TABLET BY MOUTH TWICE A DAY   Called patient to follow up. Per last visit patient was given information for PCP to manage diabetes. Patient states he has been taking his metformin daily. States he needs diabetic strips (One touch verio) patient has the same brand glucose monitor  And needs lancets. Provided patient with [facility] contact information.  Medication refill approved for metformin with 0 refills. Patient encouraged to contact PCP to establish care. Patient also scheduled for overdue follow up appointment.  Valarie Cones

## 2019-10-23 ENCOUNTER — Other Ambulatory Visit: Payer: Medicare Other

## 2019-11-03 ENCOUNTER — Encounter: Payer: Medicare Other | Admitting: Family

## 2019-11-21 ENCOUNTER — Other Ambulatory Visit: Payer: Self-pay | Admitting: Family

## 2019-11-21 DIAGNOSIS — E1165 Type 2 diabetes mellitus with hyperglycemia: Secondary | ICD-10-CM

## 2019-12-05 ENCOUNTER — Other Ambulatory Visit: Payer: Self-pay | Admitting: Family

## 2019-12-05 ENCOUNTER — Telehealth: Payer: Self-pay

## 2019-12-05 DIAGNOSIS — E1165 Type 2 diabetes mellitus with hyperglycemia: Secondary | ICD-10-CM

## 2019-12-05 NOTE — Telephone Encounter (Addendum)
Called patient to scheduled overdue appointment with office Will need to schedule lab and office visit two weeks after. Patient's Metformin deferred to PCP. Patient was instruced to establish primary care to get blood sugars under control.  Lorenso Courier, [place]

## 2019-12-19 DIAGNOSIS — Z23 Encounter for immunization: Secondary | ICD-10-CM | POA: Diagnosis not present

## 2020-02-06 ENCOUNTER — Other Ambulatory Visit: Payer: Self-pay | Admitting: Family

## 2020-02-06 DIAGNOSIS — E1165 Type 2 diabetes mellitus with hyperglycemia: Secondary | ICD-10-CM

## 2020-02-09 ENCOUNTER — Other Ambulatory Visit: Payer: Self-pay

## 2020-02-09 ENCOUNTER — Other Ambulatory Visit: Payer: Medicare Other

## 2020-02-09 DIAGNOSIS — B20 Human immunodeficiency virus [HIV] disease: Secondary | ICD-10-CM

## 2020-02-09 DIAGNOSIS — Z113 Encounter for screening for infections with a predominantly sexual mode of transmission: Secondary | ICD-10-CM | POA: Diagnosis not present

## 2020-02-10 ENCOUNTER — Telehealth: Payer: Self-pay

## 2020-02-10 LAB — T-HELPER CELL (CD4) - (RCID CLINIC ONLY)
CD4 % Helper T Cell: 23 % — ABNORMAL LOW (ref 33–65)
CD4 T Cell Abs: 598 /uL (ref 400–1790)

## 2020-02-10 NOTE — Telephone Encounter (Signed)
Received call from [facility] regarding patient's Glucose level's. Glucose, Bld 65 - 99 mg/dL 122QMGN     Will forward message to FNP. Patient does not have a PCP. Lorenso Courier, [place]

## 2020-02-11 LAB — CBC
HCT: 45.6 % (ref 38.5–50.0)
Hemoglobin: 15.1 g/dL (ref 13.2–17.1)
MCH: 27.6 pg (ref 27.0–33.0)
MCHC: 33.1 g/dL (ref 32.0–36.0)
MCV: 83.2 fL (ref 80.0–100.0)
MPV: 12.6 fL — ABNORMAL HIGH (ref 7.5–12.5)
Platelets: 243 10*3/uL (ref 140–400)
RBC: 5.48 10*6/uL (ref 4.20–5.80)
RDW: 13.7 % (ref 11.0–15.0)
WBC: 7.5 10*3/uL (ref 3.8–10.8)

## 2020-02-11 LAB — HIV-1 RNA QUANT-NO REFLEX-BLD
HIV 1 RNA Quant: 88 copies/mL — ABNORMAL HIGH
HIV-1 RNA Quant, Log: 1.94 Log copies/mL — ABNORMAL HIGH

## 2020-02-11 LAB — COMPREHENSIVE METABOLIC PANEL
AG Ratio: 1.6 (calc) (ref 1.0–2.5)
ALT: 21 U/L (ref 9–46)
AST: 16 U/L (ref 10–40)
Albumin: 4.2 g/dL (ref 3.6–5.1)
Alkaline phosphatase (APISO): 100 U/L (ref 36–130)
BUN: 20 mg/dL (ref 7–25)
CO2: 22 mmol/L (ref 20–32)
Calcium: 9.8 mg/dL (ref 8.6–10.3)
Chloride: 94 mmol/L — ABNORMAL LOW (ref 98–110)
Creat: 1.33 mg/dL (ref 0.60–1.35)
Globulin: 2.6 g/dL (calc) (ref 1.9–3.7)
Glucose, Bld: 461 mg/dL — ABNORMAL HIGH (ref 65–99)
Potassium: 5.3 mmol/L (ref 3.5–5.3)
Sodium: 128 mmol/L — ABNORMAL LOW (ref 135–146)
Total Bilirubin: 0.4 mg/dL (ref 0.2–1.2)
Total Protein: 6.8 g/dL (ref 6.1–8.1)

## 2020-02-11 LAB — RPR TITER: RPR Titer: 1:8 {titer} — ABNORMAL HIGH

## 2020-02-11 LAB — FLUORESCENT TREPONEMAL AB(FTA)-IGG-BLD: Fluorescent Treponemal ABS: REACTIVE — AB

## 2020-02-11 LAB — RPR: RPR Ser Ql: REACTIVE — AB

## 2020-02-12 NOTE — Telephone Encounter (Signed)
Blood sugar results noted. Will need to increase medication at next office visit.

## 2020-02-24 ENCOUNTER — Encounter: Payer: Medicare Other | Admitting: Family

## 2020-02-25 ENCOUNTER — Telehealth: Payer: Self-pay | Admitting: Pharmacist

## 2020-02-25 ENCOUNTER — Ambulatory Visit: Payer: Medicare Other | Admitting: Pharmacist

## 2020-02-25 ENCOUNTER — Telehealth: Payer: Self-pay | Admitting: Pharmacy Technician

## 2020-02-25 NOTE — Telephone Encounter (Signed)
[facility] Patient Advocate Encounter    Findings of the benefits investigation:   Insurance: [employer] partD  Test run with drug historically used Estimated copay amount: $0.00 Prior Authorization: not required  [facility] Patient Advocate will follow up once patient arrives for their appointment and see if any further assistance is needed.  Beulah Gandy, CPhT Specialty Pharmacy Patient [facility] for Infectious Disease Phone: [phone] Fax: [phone] 02/25/2020 7:48 AM

## 2020-02-25 NOTE — Telephone Encounter (Cosign Needed)
Mr. Zachary Hill cancelled his appointment today and will be transferring care to [facility]. He is currently on Prezista and Genvoya, however after reviewing his HIV history and labs, he may benefit from a regimen that has a higher barrier to resistance and may help him achieve undetectable status - Biktarvy + Prezcobix.   Fabio Neighbors, PharmD PGY1 Ambulatory Care Resident

## 2020-03-05 ENCOUNTER — Other Ambulatory Visit: Payer: Self-pay | Admitting: Family

## 2020-03-05 DIAGNOSIS — E1165 Type 2 diabetes mellitus with hyperglycemia: Secondary | ICD-10-CM

## 2020-03-27 ENCOUNTER — Other Ambulatory Visit: Payer: Self-pay | Admitting: Family

## 2020-03-27 DIAGNOSIS — E1165 Type 2 diabetes mellitus with hyperglycemia: Secondary | ICD-10-CM

## 2020-03-29 ENCOUNTER — Telehealth: Payer: Self-pay

## 2020-03-29 NOTE — Telephone Encounter (Signed)
Ok thank you 

## 2020-03-29 NOTE — Telephone Encounter (Signed)
Received medication refill request for metformin. Last encounter with pharmacy notes states he was transferring his care. Called patient o confirm care transfer and patient states he was never able to getting around doing that. Patient requesting to stay with [facility] for now and reschedule with pharmacy to restart his medication. Valarie Cones

## 2020-03-31 ENCOUNTER — Ambulatory Visit: Payer: Medicare Other | Admitting: Pharmacist

## 2020-03-31 NOTE — Telephone Encounter (Signed)
All benefits investigation for medication from 02/25/2020 remains the same.  Netty Starring. Dimas Aguas CPhT Specialty Pharmacy Patient [facility] for Infectious Disease Phone: [phone] Fax:  [phone]

## 2020-05-05 DIAGNOSIS — F17210 Nicotine dependence, cigarettes, uncomplicated: Secondary | ICD-10-CM | POA: Diagnosis not present

## 2020-05-05 DIAGNOSIS — E1100 Type 2 diabetes mellitus with hyperosmolarity without nonketotic hyperglycemic-hyperosmolar coma (NKHHC): Secondary | ICD-10-CM | POA: Diagnosis not present

## 2020-05-05 DIAGNOSIS — B20 Human immunodeficiency virus [HIV] disease: Secondary | ICD-10-CM | POA: Diagnosis not present

## 2020-05-05 DIAGNOSIS — Z7984 Long term (current) use of oral hypoglycemic drugs: Secondary | ICD-10-CM | POA: Diagnosis not present

## 2020-05-05 DIAGNOSIS — Z79899 Other long term (current) drug therapy: Secondary | ICD-10-CM | POA: Diagnosis not present

## 2020-06-08 DIAGNOSIS — B349 Viral infection, unspecified: Secondary | ICD-10-CM | POA: Diagnosis not present

## 2020-06-08 DIAGNOSIS — B3781 Candidal esophagitis: Secondary | ICD-10-CM | POA: Diagnosis not present

## 2020-06-08 DIAGNOSIS — B20 Human immunodeficiency virus [HIV] disease: Secondary | ICD-10-CM | POA: Diagnosis not present

## 2020-06-08 DIAGNOSIS — Z79899 Other long term (current) drug therapy: Secondary | ICD-10-CM | POA: Diagnosis not present

## 2020-06-08 DIAGNOSIS — F17210 Nicotine dependence, cigarettes, uncomplicated: Secondary | ICD-10-CM | POA: Diagnosis not present

## 2020-06-08 DIAGNOSIS — Z23 Encounter for immunization: Secondary | ICD-10-CM | POA: Diagnosis not present

## 2020-08-03 DIAGNOSIS — Z79899 Other long term (current) drug therapy: Secondary | ICD-10-CM | POA: Diagnosis not present

## 2020-08-03 DIAGNOSIS — Z7984 Long term (current) use of oral hypoglycemic drugs: Secondary | ICD-10-CM | POA: Diagnosis not present

## 2020-08-03 DIAGNOSIS — E1100 Type 2 diabetes mellitus with hyperosmolarity without nonketotic hyperglycemic-hyperosmolar coma (NKHHC): Secondary | ICD-10-CM | POA: Diagnosis not present

## 2020-08-03 DIAGNOSIS — B20 Human immunodeficiency virus [HIV] disease: Secondary | ICD-10-CM | POA: Diagnosis not present

## 2020-09-07 DIAGNOSIS — Z881 Allergy status to other antibiotic agents status: Secondary | ICD-10-CM | POA: Diagnosis not present

## 2020-09-07 DIAGNOSIS — Z8619 Personal history of other infectious and parasitic diseases: Secondary | ICD-10-CM | POA: Diagnosis not present

## 2020-09-07 DIAGNOSIS — Z79899 Other long term (current) drug therapy: Secondary | ICD-10-CM | POA: Diagnosis not present

## 2020-09-07 DIAGNOSIS — B20 Human immunodeficiency virus [HIV] disease: Secondary | ICD-10-CM | POA: Diagnosis not present

## 2020-09-07 DIAGNOSIS — Z7984 Long term (current) use of oral hypoglycemic drugs: Secondary | ICD-10-CM | POA: Diagnosis not present

## 2020-09-07 DIAGNOSIS — Z882 Allergy status to sulfonamides status: Secondary | ICD-10-CM | POA: Diagnosis not present

## 2020-09-07 DIAGNOSIS — E119 Type 2 diabetes mellitus without complications: Secondary | ICD-10-CM | POA: Diagnosis not present

## 2020-09-07 DIAGNOSIS — N529 Male erectile dysfunction, unspecified: Secondary | ICD-10-CM | POA: Diagnosis not present

## 2020-10-02 DIAGNOSIS — Z23 Encounter for immunization: Secondary | ICD-10-CM | POA: Diagnosis not present

## 2020-11-17 DIAGNOSIS — H5213 Myopia, bilateral: Secondary | ICD-10-CM | POA: Diagnosis not present

## 2020-11-17 DIAGNOSIS — E119 Type 2 diabetes mellitus without complications: Secondary | ICD-10-CM | POA: Diagnosis not present

## 2020-11-17 DIAGNOSIS — H52223 Regular astigmatism, bilateral: Secondary | ICD-10-CM | POA: Diagnosis not present

## 2021-09-29 DIAGNOSIS — R808 Other proteinuria: Secondary | ICD-10-CM | POA: Diagnosis not present

## 2021-09-29 DIAGNOSIS — B20 Human immunodeficiency virus [HIV] disease: Secondary | ICD-10-CM | POA: Diagnosis not present

## 2021-09-29 DIAGNOSIS — R6882 Decreased libido: Secondary | ICD-10-CM | POA: Diagnosis not present

## 2022-04-06 ENCOUNTER — Institutional Professional Consult (permissible substitution) (HOSPITAL_COMMUNITY): Payer: Medicaid Other

## 2022-04-06 ENCOUNTER — Inpatient Hospital Stay
Admit: 2022-04-06 | Discharge: 2022-05-09 | Disposition: A | Discharge status: Still In Hospital | Payer: Medicaid Other | Source: Other Acute Inpatient Hospital

## 2022-04-06 DIAGNOSIS — S062X4S Diffuse traumatic brain injury with loss of consciousness of 6 hours to 24 hours, sequela: Secondary | ICD-10-CM

## 2022-04-06 DIAGNOSIS — N179 Acute kidney failure, unspecified: Secondary | ICD-10-CM | POA: Diagnosis present

## 2022-04-06 DIAGNOSIS — J9621 Acute and chronic respiratory failure with hypoxia: Secondary | ICD-10-CM

## 2022-04-06 DIAGNOSIS — Z930 Tracheostomy status: Secondary | ICD-10-CM

## 2022-04-06 DIAGNOSIS — Z21 Asymptomatic human immunodeficiency virus [HIV] infection status: Secondary | ICD-10-CM

## 2022-04-06 MED ORDER — DIATRIZOATE MEGLUMINE & SODIUM 66-10 % PO SOLN
ORAL | Status: AC
  Filled 2022-04-06: qty 30

## 2022-04-07 DIAGNOSIS — N179 Acute kidney failure, unspecified: Secondary | ICD-10-CM

## 2022-04-07 DIAGNOSIS — Z930 Tracheostomy status: Secondary | ICD-10-CM

## 2022-04-07 DIAGNOSIS — J9621 Acute and chronic respiratory failure with hypoxia: Secondary | ICD-10-CM | POA: Diagnosis not present

## 2022-04-07 DIAGNOSIS — Z21 Asymptomatic human immunodeficiency virus [HIV] infection status: Secondary | ICD-10-CM | POA: Diagnosis not present

## 2022-04-07 LAB — COMPREHENSIVE METABOLIC PANEL
ALT: 102 U/L — ABNORMAL HIGH (ref 0–44)
AST: 32 U/L (ref 15–41)
Albumin: 2.3 g/dL — ABNORMAL LOW (ref 3.5–5.0)
Alkaline Phosphatase: 116 U/L (ref 38–126)
Anion gap: 9 (ref 5–15)
BUN: 14 mg/dL (ref 6–20)
CO2: 29 mmol/L (ref 22–32)
Calcium: 9.9 mg/dL (ref 8.9–10.3)
Chloride: 95 mmol/L — ABNORMAL LOW (ref 98–111)
Creatinine, Ser: 0.68 mg/dL (ref 0.61–1.24)
GFR, Estimated: >60 mL/min (ref >60)
Glucose, Bld: 139 mg/dL — ABNORMAL HIGH (ref 70–99)
Potassium: 3.9 mmol/L (ref 3.5–5.1)
Sodium: 133 mmol/L — ABNORMAL LOW (ref 135–145)
Total Bilirubin: 0.6 mg/dL (ref 0.3–1.2)
Total Protein: 7.3 g/dL (ref 6.5–8.1)

## 2022-04-07 LAB — CBC WITH DIFFERENTIAL/PLATELET
Abs Immature Granulocytes: 0.04 10*3/uL (ref 0.00–0.07)
Basophils Absolute: 0 10*3/uL (ref 0.0–0.1)
Basophils Relative: 0 %
Eosinophils Absolute: 0.3 10*3/uL (ref 0.0–0.5)
Eosinophils Relative: 4 %
HCT: 28.2 % — ABNORMAL LOW (ref 39.0–52.0)
Hemoglobin: 9.3 g/dL — ABNORMAL LOW (ref 13.0–17.0)
Immature Granulocytes: 1 %
Lymphocytes Relative: 18 %
Lymphs Abs: 1.3 10*3/uL (ref 0.7–4.0)
MCH: 27.3 pg (ref 26.0–34.0)
MCHC: 33 g/dL (ref 30.0–36.0)
MCV: 82.7 fL (ref 80.0–100.0)
Monocytes Absolute: 0.8 10*3/uL (ref 0.1–1.0)
Monocytes Relative: 11 %
Neutro Abs: 5.1 10*3/uL (ref 1.7–7.7)
Neutrophils Relative %: 66 %
Platelets: 330 10*3/uL (ref 150–400)
RBC: 3.41 MIL/uL — ABNORMAL LOW (ref 4.22–5.81)
RDW: 13.6 % (ref 11.5–15.5)
WBC: 7.7 10*3/uL (ref 4.0–10.5)
nRBC: 0 % (ref 0.0–0.2)

## 2022-04-07 LAB — HEMOGLOBIN A1C
Hgb A1c MFr Bld: 6.4 % — ABNORMAL HIGH (ref 4.8–5.6)
Mean Plasma Glucose: 136.98 mg/dL
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
## 2022-04-08 DIAGNOSIS — Z930 Tracheostomy status: Secondary | ICD-10-CM | POA: Diagnosis not present

## 2022-04-08 DIAGNOSIS — J9621 Acute and chronic respiratory failure with hypoxia: Secondary | ICD-10-CM | POA: Diagnosis not present

## 2022-04-08 DIAGNOSIS — N179 Acute kidney failure, unspecified: Secondary | ICD-10-CM | POA: Diagnosis not present

## 2022-04-08 DIAGNOSIS — Z21 Asymptomatic human immunodeficiency virus [HIV] infection status: Secondary | ICD-10-CM | POA: Diagnosis not present

## 2022-04-08 LAB — URINALYSIS, ROUTINE W REFLEX MICROSCOPIC
Bilirubin Urine: NEGATIVE
Glucose, UA: NEGATIVE mg/dL
Ketones, ur: NEGATIVE mg/dL
Nitrite: POSITIVE — AB
Protein, ur: >=300 mg/dL — AB
RBC / HPF: >50 RBC/hpf — ABNORMAL HIGH (ref 0–5)
Specific Gravity, Urine: 1.029 (ref 1.005–1.030)
WBC, UA: >50 WBC/hpf — ABNORMAL HIGH (ref 0–5)
pH: 5 (ref 5.0–8.0)

## 2022-04-08 LAB — LACTIC ACID, PLASMA
Lactic Acid, Venous: 3.1 mmol/L (ref 0.5–1.9)
Lactic Acid, Venous: 1 mmol/L (ref 0.5–1.9)

## 2022-04-09 ENCOUNTER — Other Ambulatory Visit (HOSPITAL_COMMUNITY): Payer: Medicaid Other

## 2022-04-09 DIAGNOSIS — Z930 Tracheostomy status: Secondary | ICD-10-CM | POA: Diagnosis not present

## 2022-04-09 DIAGNOSIS — N179 Acute kidney failure, unspecified: Secondary | ICD-10-CM | POA: Diagnosis not present

## 2022-04-09 DIAGNOSIS — Z21 Asymptomatic human immunodeficiency virus [HIV] infection status: Secondary | ICD-10-CM | POA: Diagnosis not present

## 2022-04-09 DIAGNOSIS — J9621 Acute and chronic respiratory failure with hypoxia: Secondary | ICD-10-CM | POA: Diagnosis not present

## 2022-04-09 LAB — COMPREHENSIVE METABOLIC PANEL
ALT: 77 U/L — ABNORMAL HIGH (ref 0–44)
AST: 32 U/L (ref 15–41)
Albumin: 2.9 g/dL — ABNORMAL LOW (ref 3.5–5.0)
Alkaline Phosphatase: 143 U/L — ABNORMAL HIGH (ref 38–126)
Anion gap: 11 (ref 5–15)
BUN: 18 mg/dL (ref 6–20)
CO2: 26 mmol/L (ref 22–32)
Calcium: 9.9 mg/dL (ref 8.9–10.3)
Chloride: 97 mmol/L — ABNORMAL LOW (ref 98–111)
Creatinine, Ser: 0.79 mg/dL (ref 0.61–1.24)
GFR, Estimated: >60 mL/min (ref >60)
Glucose, Bld: 198 mg/dL — ABNORMAL HIGH (ref 70–99)
Potassium: 5 mmol/L (ref 3.5–5.1)
Sodium: 134 mmol/L — ABNORMAL LOW (ref 135–145)
Total Bilirubin: 0.7 mg/dL (ref 0.3–1.2)
Total Protein: 7.6 g/dL (ref 6.5–8.1)

## 2022-04-09 LAB — MAGNESIUM: Magnesium: 1.9 mg/dL (ref 1.7–2.4)

## 2022-04-09 LAB — CBC
HCT: 31.3 % — ABNORMAL LOW (ref 39.0–52.0)
Hemoglobin: 10.1 g/dL — ABNORMAL LOW (ref 13.0–17.0)
MCH: 26.7 pg (ref 26.0–34.0)
MCHC: 32.3 g/dL (ref 30.0–36.0)
MCV: 82.8 fL (ref 80.0–100.0)
Platelets: 177 10*3/uL (ref 150–400)
RBC: 3.78 MIL/uL — ABNORMAL LOW (ref 4.22–5.81)
RDW: 14 % (ref 11.5–15.5)
WBC: 16.9 10*3/uL — ABNORMAL HIGH (ref 4.0–10.5)
nRBC: 0 % (ref 0.0–0.2)

## 2022-04-09 LAB — PHOSPHORUS: Phosphorus: 2.6 mg/dL (ref 2.5–4.6)
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
## 2022-04-09 NOTE — Progress Notes (Cosign Needed)
Pulmonary Critical Care Medicine [facility] [place]   PULMONARY CRITICAL CARE SERVICE  PROGRESS NOTE     Zachary Hill  [id number]:[id number]  DOB: 1981-08-09   DOA: 04/06/2022  Referring Physician: Luna Kitchens, MD  HPI: Zachary Hill is a 41 y.o. male being followed for ventilator/airway/oxygen weaning Acute on Chronic Respiratory Failure.  Patient seen lying in bed, currently on ATC 21%.  Bilateral wrist restraints in place.  No acute distress, no acute overnight events.  Medications: Reviewed on Rounds  Physical Exam:  Vitals: Temp 97.0, pulse 75, respirations 23, BP 134/81, SPO2 100%  Ventilator Settings ATC 21%  General: Comfortable at this time Neck: supple Cardiovascular: no malignant arrhythmias Respiratory: Bilaterally diminished Skin: no rash seen on limited exam Musculoskeletal: No gross abnormality Psychiatric:unable to assess Neurologic:no involuntary movements         Lab Data:   Basic Metabolic Panel: Recent Labs  Lab 04/07/22 0258 04/09/22 1231  NA 133* 134*  K 3.9 5.0  CL 95* 97*  CO2 29 26  GLUCOSE 139* 198*  BUN 14 18  CREATININE 0.68 0.79  CALCIUM 9.9 9.9  MG  --  1.9  PHOS  --  2.6    ABG: No results for input(s): "PHART", "PCO2ART", "PO2ART", "HCO3", "O2SAT" in the last 168 hours.  Liver Function Tests: Recent Labs  Lab 04/07/22 0258 04/09/22 1231  AST 32 32  ALT 102* 77*  ALKPHOS 116 143*  BILITOT 0.6 0.7  PROT 7.3 7.6  ALBUMIN 2.3* 2.9*   No results for input(s): "LIPASE", "AMYLASE" in the last 168 hours. No results for input(s): "AMMONIA" in the last 168 hours.  CBC: Recent Labs  Lab 04/07/22 0258 04/09/22 0942  WBC 7.7 16.9*  NEUTROABS 5.1  --   HGB 9.3* 10.1*  HCT 28.2* 31.3*  MCV 82.7 82.8  PLT 330 177    Cardiac Enzymes: No results for input(s): "CKTOTAL", "CKMB", "CKMBINDEX", "TROPONINI" in the last 168 hours.  BNP (last 3 results) No results for input(s): "BNP" in the last 8760  hours.  ProBNP (last 3 results) No results for input(s): "PROBNP" in the last 8760 hours.  Radiological Exams: DG CHEST PORT 1 VIEW  Result Date: 04/09/2022 CLINICAL DATA:  PICC placement. EXAM: PORTABLE CHEST 1 VIEW COMPARISON:  Chest x-ray dated April 06, 2022. FINDINGS: New left upper extremity PICC line with tip at the cavoatrial junction. Unchanged tracheostomy and feeding tubes. The heart size and mediastinal contours are within normal limits. Normal pulmonary vascularity. No focal consolidation, pleural effusion, or pneumothorax. Mild elevation of the right hemidiaphragm. No acute osseous abnormality. IMPRESSION: 1. New left upper extremity PICC line with tip at the cavoatrial junction. 2. No active disease. Electronically Signed   By: Obie Dredge M.D.   On: 04/09/2022 12:07    Assessment/Plan Active Problems:   Asymptomatic human immunodeficiency virus (HIV) infection status (HCC)   Acute kidney injury (HCC)   Acute on chronic respiratory failure with hypoxia (HCC)   Diffuse traumatic brain injury with LOC of 6 hours to 24 hours, sequela (HCC)   Tracheostomy status (HCC)  Acute on chronic respiratory failure with hypoxia-was admitted to hospital on aerosol trach collar 28%, have since weaned him down to 21%.  We will continue weaning per protocol and as patient tolerates.  Continue with supportive care. Status post tracheostomy-remains in place #6 cuffless.  Continue with trach care per protocol. Multi-- compartmental intracerebral hemorrhage-continue with supportive care.  Every shift neurochecks. Acute renal insufficiency follow patient's lab  work creatinine and BUN has normalized HIV asymptomatic supportive care     I have personally seen and evaluated the patient, evaluated laboratory and imaging results, formulated the assessment and plan and placed orders. The Patient requires high complexity decision making with multiple systems involvement.  Rounds were done with the  Respiratory Therapy Director and Staff therapists and discussed with nursing staff also.  Yevonne Pax, MD [facility] Pulmonary Critical Care Medicine Sleep Medicine

## 2022-04-10 DIAGNOSIS — Z930 Tracheostomy status: Secondary | ICD-10-CM

## 2022-04-10 DIAGNOSIS — J9621 Acute and chronic respiratory failure with hypoxia: Secondary | ICD-10-CM | POA: Diagnosis not present

## 2022-04-10 DIAGNOSIS — N179 Acute kidney failure, unspecified: Secondary | ICD-10-CM | POA: Diagnosis not present

## 2022-04-10 DIAGNOSIS — S062X4S Diffuse traumatic brain injury with loss of consciousness of 6 hours to 24 hours, sequela: Secondary | ICD-10-CM

## 2022-04-10 DIAGNOSIS — Z21 Asymptomatic human immunodeficiency virus [HIV] infection status: Secondary | ICD-10-CM | POA: Diagnosis not present

## 2022-04-10 LAB — VANCOMYCIN, TROUGH: Vancomycin Tr: 10 ug/mL — ABNORMAL LOW (ref 15–20)

## 2022-04-10 NOTE — Progress Notes (Signed)
Pulmonary Critical Care Medicine [facility] [place]   PULMONARY CRITICAL CARE SERVICE  PROGRESS NOTE     Zachary Hill  [id number]:[id number]  DOB: [birth year]   DOA: 04/06/2022  Referring Physician: Luna Kitchens, MD  HPI: Zachary Hill is a 41 y.o. male being followed for ventilator/airway/oxygen weaning Acute on Chronic Respiratory Failure.  Patient is off the ventilator on T collar remains nonverbal  Medications: Reviewed on Rounds  Physical Exam:  Vitals: Temperature is 97.2 pulse 106 respiratory is 11 blood pressure 138/85 saturations 96%  Ventilator Settings on T collar room air  General: Comfortable at this time Neck: supple Cardiovascular: no malignant arrhythmias Respiratory: Scattered rhonchi expansion is equal Skin: no rash seen on limited exam Musculoskeletal: No gross abnormality Psychiatric:unable to assess Neurologic:no involuntary movements         Lab Data:   Basic Metabolic Panel: Recent Labs  Lab 04/07/22 0258 04/09/22 1231  NA 133* 134*  K 3.9 5.0  CL 95* 97*  CO2 29 26  GLUCOSE 139* 198*  BUN 14 18  CREATININE 0.68 0.79  CALCIUM 9.9 9.9  MG  --  1.9  PHOS  --  2.6    ABG: No results for input(s): "PHART", "PCO2ART", "PO2ART", "HCO3", "O2SAT" in the last 168 hours.  Liver Function Tests: Recent Labs  Lab 04/07/22 0258 04/09/22 1231  AST 32 32  ALT 102* 77*  ALKPHOS 116 143*  BILITOT 0.6 0.7  PROT 7.3 7.6  ALBUMIN 2.3* 2.9*   No results for input(s): "LIPASE", "AMYLASE" in the last 168 hours. No results for input(s): "AMMONIA" in the last 168 hours.  CBC: Recent Labs  Lab 04/07/22 0258 04/09/22 0942  WBC 7.7 16.9*  NEUTROABS 5.1  --   HGB 9.3* 10.1*  HCT 28.2* 31.3*  MCV 82.7 82.8  PLT 330 177    Cardiac Enzymes: No results for input(s): "CKTOTAL", "CKMB", "CKMBINDEX", "TROPONINI" in the last 168 hours.  BNP (last 3 results) No results for input(s): "BNP" in the last 8760 hours.  ProBNP (last 3  results) No results for input(s): "PROBNP" in the last 8760 hours.  Radiological Exams: DG CHEST PORT 1 VIEW  Result Date: 04/09/2022 CLINICAL DATA:  PICC placement. EXAM: PORTABLE CHEST 1 VIEW COMPARISON:  Chest x-ray dated April 06, 2022. FINDINGS: New left upper extremity PICC line with tip at the cavoatrial junction. Unchanged tracheostomy and feeding tubes. The heart size and mediastinal contours are within normal limits. Normal pulmonary vascularity. No focal consolidation, pleural effusion, or pneumothorax. Mild elevation of the right hemidiaphragm. No acute osseous abnormality. IMPRESSION: 1. New left upper extremity PICC line with tip at the cavoatrial junction. 2. No active disease. Electronically Signed   By: Obie Dredge M.D.   On: 04/09/2022 12:07    Assessment/Plan Active Problems:   Asymptomatic human immunodeficiency virus (HIV) infection status (HCC)   Acute kidney injury (HCC)   Acute on chronic respiratory failure with hypoxia (HCC)   Diffuse traumatic brain injury with LOC of 6 hours to 24 hours, sequela (HCC)   Tracheostomy status (HCC)   Acute on chronic respiratory failure hypoxia we will continue with weaning protocol as patient is able to tolerate.  Continue with secretion management pulmonary toilet. Acute renal failure resolved Traumatic brain injury no change neurologically HIV disease asymptomatic supportive care Tracheostomy status at baseline   I have personally seen and evaluated the patient, evaluated laboratory and imaging results, formulated the assessment and plan and placed orders. The Patient requires high complexity  decision making with multiple systems involvement.  Rounds were done with the Respiratory Therapy Director and Staff therapists and discussed with nursing staff also.  Allyne Gee, MD [facility] Pulmonary Critical Care Medicine Sleep Medicine

## 2022-04-11 DIAGNOSIS — Z930 Tracheostomy status: Secondary | ICD-10-CM | POA: Diagnosis not present

## 2022-04-11 DIAGNOSIS — N179 Acute kidney failure, unspecified: Secondary | ICD-10-CM | POA: Diagnosis not present

## 2022-04-11 DIAGNOSIS — Z21 Asymptomatic human immunodeficiency virus [HIV] infection status: Secondary | ICD-10-CM | POA: Diagnosis not present

## 2022-04-11 DIAGNOSIS — J9621 Acute and chronic respiratory failure with hypoxia: Secondary | ICD-10-CM | POA: Diagnosis not present

## 2022-04-11 LAB — CBC
HCT: 29 % — ABNORMAL LOW (ref 39.0–52.0)
Hemoglobin: 9.4 g/dL — ABNORMAL LOW (ref 13.0–17.0)
MCH: 27 pg (ref 26.0–34.0)
MCHC: 32.4 g/dL (ref 30.0–36.0)
MCV: 83.3 fL (ref 80.0–100.0)
Platelets: 174 10*3/uL (ref 150–400)
RBC: 3.48 MIL/uL — ABNORMAL LOW (ref 4.22–5.81)
RDW: 14 % (ref 11.5–15.5)
WBC: 12.9 10*3/uL — ABNORMAL HIGH (ref 4.0–10.5)
nRBC: 0 % (ref 0.0–0.2)

## 2022-04-11 LAB — RPR
RPR Ser Ql: REACTIVE — AB
RPR Titer: 1:4 {titer}

## 2022-04-11 LAB — VANCOMYCIN, TROUGH: Vancomycin Tr: 34 ug/mL (ref 15–20)
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
## 2022-04-12 ENCOUNTER — Institutional Professional Consult (permissible substitution) (HOSPITAL_COMMUNITY): Payer: Medicaid Other

## 2022-04-12 DIAGNOSIS — Z21 Asymptomatic human immunodeficiency virus [HIV] infection status: Secondary | ICD-10-CM | POA: Diagnosis not present

## 2022-04-12 DIAGNOSIS — N179 Acute kidney failure, unspecified: Secondary | ICD-10-CM | POA: Diagnosis not present

## 2022-04-12 DIAGNOSIS — J9621 Acute and chronic respiratory failure with hypoxia: Secondary | ICD-10-CM | POA: Diagnosis not present

## 2022-04-12 DIAGNOSIS — Z930 Tracheostomy status: Secondary | ICD-10-CM | POA: Diagnosis not present

## 2022-04-12 LAB — T.PALLIDUM AB, TOTAL: T Pallidum Abs: REACTIVE — AB

## 2022-04-12 MED ORDER — CEFAZOLIN SODIUM-DEXTROSE 2-4 GM/100ML-% IV SOLN: 2.0000 g | INTRAVENOUS | Status: AC
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
## 2022-04-13 DIAGNOSIS — J9621 Acute and chronic respiratory failure with hypoxia: Secondary | ICD-10-CM | POA: Diagnosis not present

## 2022-04-13 DIAGNOSIS — Z21 Asymptomatic human immunodeficiency virus [HIV] infection status: Secondary | ICD-10-CM | POA: Diagnosis not present

## 2022-04-13 DIAGNOSIS — N179 Acute kidney failure, unspecified: Secondary | ICD-10-CM | POA: Diagnosis not present

## 2022-04-13 DIAGNOSIS — Z930 Tracheostomy status: Secondary | ICD-10-CM | POA: Diagnosis not present

## 2022-04-13 LAB — CBC
HCT: 28.4 % — ABNORMAL LOW (ref 39.0–52.0)
Hemoglobin: 9.3 g/dL — ABNORMAL LOW (ref 13.0–17.0)
MCH: 27.8 pg (ref 26.0–34.0)
MCHC: 32.7 g/dL (ref 30.0–36.0)
MCV: 85 fL (ref 80.0–100.0)
Platelets: 209 10*3/uL (ref 150–400)
RBC: 3.34 MIL/uL — ABNORMAL LOW (ref 4.22–5.81)
RDW: 14.6 % (ref 11.5–15.5)
WBC: 11.6 10*3/uL — ABNORMAL HIGH (ref 4.0–10.5)
nRBC: 0.4 % — ABNORMAL HIGH (ref 0.0–0.2)

## 2022-04-13 LAB — CULTURE, BLOOD (ROUTINE X 2)
Culture: NO GROWTH
Culture: NO GROWTH
Culture: NO GROWTH
Special Requests: ADEQUATE

## 2022-04-13 LAB — PROTIME-INR
INR: 1.2 (ref 0.8–1.2)
Prothrombin Time: 14.6 seconds (ref 11.4–15.2)

## 2022-04-13 NOTE — Progress Notes (Signed)
Spoke with fiance Jannet Askew via telephone who gave consent to proceed with G tube placement.   The procedure is tentatively scheduled for tomorrow pending IR schedule.   RN informed to hold TF at [facility], Lovenox has been held since yesterday.   Please call IR for questions and concerns.   Lynann Bologna Eveleen Mcnear PA-C 04/13/2022 5:23 PM
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
## 2022-04-14 DIAGNOSIS — N179 Acute kidney failure, unspecified: Secondary | ICD-10-CM | POA: Diagnosis not present

## 2022-04-14 DIAGNOSIS — J9621 Acute and chronic respiratory failure with hypoxia: Secondary | ICD-10-CM | POA: Diagnosis not present

## 2022-04-14 DIAGNOSIS — Z930 Tracheostomy status: Secondary | ICD-10-CM | POA: Diagnosis not present

## 2022-04-14 DIAGNOSIS — Z21 Asymptomatic human immunodeficiency virus [HIV] infection status: Secondary | ICD-10-CM | POA: Diagnosis not present

## 2022-04-14 LAB — VANCOMYCIN, TROUGH: Vancomycin Tr: 24 ug/mL (ref 15–20)
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
## 2022-04-15 DIAGNOSIS — N179 Acute kidney failure, unspecified: Secondary | ICD-10-CM | POA: Diagnosis not present

## 2022-04-15 DIAGNOSIS — Z21 Asymptomatic human immunodeficiency virus [HIV] infection status: Secondary | ICD-10-CM | POA: Diagnosis not present

## 2022-04-15 DIAGNOSIS — J9621 Acute and chronic respiratory failure with hypoxia: Secondary | ICD-10-CM | POA: Diagnosis not present

## 2022-04-15 DIAGNOSIS — Z930 Tracheostomy status: Secondary | ICD-10-CM | POA: Diagnosis not present

## 2022-04-15 LAB — CBC WITH DIFFERENTIAL/PLATELET
Abs Immature Granulocytes: 0.1 10*3/uL — ABNORMAL HIGH (ref 0.00–0.07)
Basophils Absolute: 0 10*3/uL (ref 0.0–0.1)
Basophils Relative: 0 %
Eosinophils Absolute: 0.2 10*3/uL (ref 0.0–0.5)
Eosinophils Relative: 2 %
HCT: 32.4 % — ABNORMAL LOW (ref 39.0–52.0)
Hemoglobin: 10.3 g/dL — ABNORMAL LOW (ref 13.0–17.0)
Immature Granulocytes: 1 %
Lymphocytes Relative: 25 %
Lymphs Abs: 3.2 10*3/uL (ref 0.7–4.0)
MCH: 26.5 pg (ref 26.0–34.0)
MCHC: 31.8 g/dL (ref 30.0–36.0)
MCV: 83.5 fL (ref 80.0–100.0)
Monocytes Absolute: 2.5 10*3/uL — ABNORMAL HIGH (ref 0.1–1.0)
Monocytes Relative: 20 %
Neutro Abs: 6.6 10*3/uL (ref 1.7–7.7)
Neutrophils Relative %: 52 %
Platelets: 264 10*3/uL (ref 150–400)
RBC: 3.88 MIL/uL — ABNORMAL LOW (ref 4.22–5.81)
RDW: 14.8 % (ref 11.5–15.5)
WBC: 12.6 10*3/uL — ABNORMAL HIGH (ref 4.0–10.5)
nRBC: 0 % (ref 0.0–0.2)

## 2022-04-15 LAB — BASIC METABOLIC PANEL
Anion gap: 14 (ref 5–15)
BUN: 10 mg/dL (ref 6–20)
CO2: 23 mmol/L (ref 22–32)
Calcium: 10.8 mg/dL — ABNORMAL HIGH (ref 8.9–10.3)
Chloride: 100 mmol/L (ref 98–111)
Creatinine, Ser: 0.84 mg/dL (ref 0.61–1.24)
GFR, Estimated: >60 mL/min (ref >60)
Glucose, Bld: 106 mg/dL — ABNORMAL HIGH (ref 70–99)
Potassium: 4.7 mmol/L (ref 3.5–5.1)
Sodium: 137 mmol/L (ref 135–145)

## 2022-04-15 LAB — TROPONIN I (HIGH SENSITIVITY): Troponin I (High Sensitivity): 7 ng/L (ref <18)

## 2022-04-15 LAB — CBC
HCT: 30.5 % — ABNORMAL LOW (ref 39.0–52.0)
Hemoglobin: 10 g/dL — ABNORMAL LOW (ref 13.0–17.0)
MCH: 26.7 pg (ref 26.0–34.0)
MCHC: 32.8 g/dL (ref 30.0–36.0)
MCV: 81.3 fL (ref 80.0–100.0)
Platelets: 234 10*3/uL (ref 150–400)
RBC: 3.75 MIL/uL — ABNORMAL LOW (ref 4.22–5.81)
RDW: 14.6 % (ref 11.5–15.5)
WBC: 12.2 10*3/uL — ABNORMAL HIGH (ref 4.0–10.5)
nRBC: 0 % (ref 0.0–0.2)

## 2022-04-15 LAB — RPR
RPR Ser Ql: REACTIVE — AB
RPR Titer: 1:4 {titer}

## 2022-04-15 LAB — MAGNESIUM: Magnesium: 1.7 mg/dL (ref 1.7–2.4)
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
## 2022-04-16 DIAGNOSIS — Z21 Asymptomatic human immunodeficiency virus [HIV] infection status: Secondary | ICD-10-CM | POA: Diagnosis not present

## 2022-04-16 DIAGNOSIS — J9621 Acute and chronic respiratory failure with hypoxia: Secondary | ICD-10-CM | POA: Diagnosis not present

## 2022-04-16 DIAGNOSIS — Z930 Tracheostomy status: Secondary | ICD-10-CM | POA: Diagnosis not present

## 2022-04-16 DIAGNOSIS — N179 Acute kidney failure, unspecified: Secondary | ICD-10-CM | POA: Diagnosis not present
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
## 2022-04-17 DIAGNOSIS — Z21 Asymptomatic human immunodeficiency virus [HIV] infection status: Secondary | ICD-10-CM | POA: Diagnosis not present

## 2022-04-17 DIAGNOSIS — J9621 Acute and chronic respiratory failure with hypoxia: Secondary | ICD-10-CM | POA: Diagnosis not present

## 2022-04-17 DIAGNOSIS — Z930 Tracheostomy status: Secondary | ICD-10-CM | POA: Diagnosis not present

## 2022-04-17 DIAGNOSIS — N179 Acute kidney failure, unspecified: Secondary | ICD-10-CM | POA: Diagnosis not present

## 2022-04-17 LAB — CBC
HCT: 32.6 % — ABNORMAL LOW (ref 39.0–52.0)
Hemoglobin: 10.2 g/dL — ABNORMAL LOW (ref 13.0–17.0)
MCH: 26.1 pg (ref 26.0–34.0)
MCHC: 31.3 g/dL (ref 30.0–36.0)
MCV: 83.4 fL (ref 80.0–100.0)
Platelets: 322 10*3/uL (ref 150–400)
RBC: 3.91 MIL/uL — ABNORMAL LOW (ref 4.22–5.81)
RDW: 15.2 % (ref 11.5–15.5)
WBC: 9 10*3/uL (ref 4.0–10.5)
nRBC: 0 % (ref 0.0–0.2)

## 2022-04-17 LAB — PROTIME-INR
INR: 1.2 (ref 0.8–1.2)
Prothrombin Time: 14.9 seconds (ref 11.4–15.2)

## 2022-04-17 LAB — T.PALLIDUM AB, TOTAL: T Pallidum Abs: REACTIVE — AB

## 2022-04-17 LAB — MAGNESIUM: Magnesium: 2 mg/dL (ref 1.7–2.4)
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
## 2022-04-18 DIAGNOSIS — S062X4S Diffuse traumatic brain injury with loss of consciousness of 6 hours to 24 hours, sequela: Secondary | ICD-10-CM

## 2022-04-18 DIAGNOSIS — J9621 Acute and chronic respiratory failure with hypoxia: Secondary | ICD-10-CM | POA: Diagnosis not present

## 2022-04-18 DIAGNOSIS — N179 Acute kidney failure, unspecified: Secondary | ICD-10-CM | POA: Diagnosis not present

## 2022-04-18 DIAGNOSIS — Z21 Asymptomatic human immunodeficiency virus [HIV] infection status: Secondary | ICD-10-CM | POA: Diagnosis not present
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
## 2022-04-19 ENCOUNTER — Other Ambulatory Visit (HOSPITAL_COMMUNITY): Payer: Medicaid Other

## 2022-04-19 DIAGNOSIS — S062X4S Diffuse traumatic brain injury with loss of consciousness of 6 hours to 24 hours, sequela: Secondary | ICD-10-CM | POA: Diagnosis not present

## 2022-04-19 DIAGNOSIS — Z21 Asymptomatic human immunodeficiency virus [HIV] infection status: Secondary | ICD-10-CM | POA: Diagnosis not present

## 2022-04-19 DIAGNOSIS — J9621 Acute and chronic respiratory failure with hypoxia: Secondary | ICD-10-CM | POA: Diagnosis not present

## 2022-04-19 DIAGNOSIS — N179 Acute kidney failure, unspecified: Secondary | ICD-10-CM | POA: Diagnosis not present

## 2022-04-19 LAB — ECHOCARDIOGRAM COMPLETE
AR max vel: 2.76 cm2
AV Peak grad: 3.4 mmHg
Ao pk vel: 0.93 m/s
S' Lateral: 3.5 cm
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
## 2022-04-20 ENCOUNTER — Other Ambulatory Visit (HOSPITAL_COMMUNITY): Payer: Medicaid Other

## 2022-04-20 DIAGNOSIS — N179 Acute kidney failure, unspecified: Secondary | ICD-10-CM | POA: Diagnosis not present

## 2022-04-20 DIAGNOSIS — S062X4S Diffuse traumatic brain injury with loss of consciousness of 6 hours to 24 hours, sequela: Secondary | ICD-10-CM | POA: Diagnosis not present

## 2022-04-20 DIAGNOSIS — J9621 Acute and chronic respiratory failure with hypoxia: Secondary | ICD-10-CM | POA: Diagnosis not present

## 2022-04-20 DIAGNOSIS — Z21 Asymptomatic human immunodeficiency virus [HIV] infection status: Secondary | ICD-10-CM | POA: Diagnosis not present

## 2022-04-20 LAB — COMPREHENSIVE METABOLIC PANEL
ALT: 52 U/L — ABNORMAL HIGH (ref 0–44)
AST: 35 U/L (ref 15–41)
Albumin: 2.9 g/dL — ABNORMAL LOW (ref 3.5–5.0)
Alkaline Phosphatase: 91 U/L (ref 38–126)
Anion gap: 9 (ref 5–15)
BUN: 12 mg/dL (ref 6–20)
CO2: 27 mmol/L (ref 22–32)
Calcium: 10.4 mg/dL — ABNORMAL HIGH (ref 8.9–10.3)
Chloride: 99 mmol/L (ref 98–111)
Creatinine, Ser: 0.97 mg/dL (ref 0.61–1.24)
GFR, Estimated: >60 mL/min (ref >60)
Glucose, Bld: 152 mg/dL — ABNORMAL HIGH (ref 70–99)
Potassium: 4.6 mmol/L (ref 3.5–5.1)
Sodium: 135 mmol/L (ref 135–145)
Total Bilirubin: 0.5 mg/dL (ref 0.3–1.2)
Total Protein: 7.1 g/dL (ref 6.5–8.1)

## 2022-04-20 LAB — CBC
HCT: 32.5 % — ABNORMAL LOW (ref 39.0–52.0)
Hemoglobin: 10.2 g/dL — ABNORMAL LOW (ref 13.0–17.0)
MCH: 26.8 pg (ref 26.0–34.0)
MCHC: 31.4 g/dL (ref 30.0–36.0)
MCV: 85.5 fL (ref 80.0–100.0)
Platelets: 291 10*3/uL (ref 150–400)
RBC: 3.8 MIL/uL — ABNORMAL LOW (ref 4.22–5.81)
RDW: 15.7 % — ABNORMAL HIGH (ref 11.5–15.5)
WBC: 9.2 10*3/uL (ref 4.0–10.5)
nRBC: 0 % (ref 0.0–0.2)

## 2022-04-20 LAB — MAGNESIUM: Magnesium: 1.6 mg/dL — ABNORMAL LOW (ref 1.7–2.4)

## 2022-04-20 LAB — PHOSPHORUS: Phosphorus: 4.4 mg/dL (ref 2.5–4.6)
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
## 2022-04-21 LAB — MAGNESIUM: Magnesium: 1.8 mg/dL (ref 1.7–2.4)

## 2022-04-21 LAB — T.PALLIDUM AB, TOTAL: T Pallidum Abs: REACTIVE — AB

## 2022-04-21 LAB — RPR
RPR Ser Ql: REACTIVE — AB
RPR Titer: 1:4 {titer}

## 2022-04-21 NOTE — Progress Notes (Signed)
IR reconsulted for possible G-tube. Anatomy was borderline based on CT imaging. Formal consult performed and procedure scheduled but ultimately decision made not to proceed. IR asked to reconsider. Imaging reviewed again with Dr. Miles Costain and Dr. Fredia Sorrow who agree that although anatomy is borderline for safe percutaneous window, IR can bring pt down and look under fluoro to see if procedure safe.  Will coordinate to stop TF and Lovenox in preparation to try procedure on Monday 7/24. Discussed with ordering MD that ultimately pt still may not be a candidate based on anatomy given close proximity of liver and colon to stomach.  Family already aware and we still have signed consent forms in dept.  Brayton El PA-C Interventional Radiology 04/21/2022 2:19 PM

## 2022-04-23 ENCOUNTER — Other Ambulatory Visit (HOSPITAL_COMMUNITY): Payer: Medicaid Other

## 2022-04-23 LAB — BASIC METABOLIC PANEL
Anion gap: 9 (ref 5–15)
BUN: 11 mg/dL (ref 6–20)
CO2: 28 mmol/L (ref 22–32)
Calcium: 10.9 mg/dL — ABNORMAL HIGH (ref 8.9–10.3)
Chloride: 100 mmol/L (ref 98–111)
Creatinine, Ser: 0.88 mg/dL (ref 0.61–1.24)
GFR, Estimated: >60 mL/min (ref >60)
Glucose, Bld: 119 mg/dL — ABNORMAL HIGH (ref 70–99)
Potassium: 4.4 mmol/L (ref 3.5–5.1)
Sodium: 137 mmol/L (ref 135–145)

## 2022-04-23 LAB — MAGNESIUM: Magnesium: 1.7 mg/dL (ref 1.7–2.4)

## 2022-04-24 ENCOUNTER — Other Ambulatory Visit (HOSPITAL_COMMUNITY): Payer: Medicaid Other

## 2022-04-24 HISTORY — PX: IR GASTROSTOMY TUBE MOD SED: IMG625

## 2022-04-24 MED ORDER — IOHEXOL 300 MG/ML  SOLN
100.0000 mL | Freq: Once | INTRAMUSCULAR | Status: AC | PRN
  Administered 2022-04-24: 25 mL

## 2022-04-24 MED ORDER — LIDOCAINE HCL 1 % IJ SOLN
INTRAMUSCULAR | Status: AC
  Administered 2022-04-24: 5 mL
  Filled 2022-04-24: qty 20

## 2022-04-24 MED ORDER — GLUCAGON HCL (RDNA) 1 MG IJ SOLR
INTRAMUSCULAR | Status: AC | PRN
  Administered 2022-04-24: .5 mg via INTRAVENOUS

## 2022-04-24 MED ORDER — MIDAZOLAM HCL 2 MG/2ML IJ SOLN
INTRAMUSCULAR | Status: AC
  Filled 2022-04-24: qty 2

## 2022-04-24 MED ORDER — FENTANYL CITRATE (PF) 100 MCG/2ML IJ SOLN
INTRAMUSCULAR | Status: AC | PRN
  Administered 2022-04-24: 25 ug via INTRAVENOUS
  Administered 2022-04-24: 50 ug via INTRAVENOUS

## 2022-04-24 MED ORDER — LIDOCAINE HCL (PF) 1 % IJ SOLN
INTRAMUSCULAR | Status: AC | PRN
  Administered 2022-04-24: 10 mL

## 2022-04-24 MED ORDER — FENTANYL CITRATE (PF) 100 MCG/2ML IJ SOLN
INTRAMUSCULAR | Status: AC
  Filled 2022-04-24: qty 2

## 2022-04-24 MED ORDER — GLUCAGON HCL RDNA (DIAGNOSTIC) 1 MG IJ SOLR
INTRAMUSCULAR | Status: AC
  Filled 2022-04-24: qty 1

## 2022-04-24 MED ORDER — CEFAZOLIN SODIUM-DEXTROSE 2-4 GM/100ML-% IV SOLN
INTRAVENOUS | Status: AC | PRN
  Administered 2022-04-24: 2 g via INTRAVENOUS

## 2022-04-24 MED ORDER — MIDAZOLAM HCL 2 MG/2ML IJ SOLN
INTRAMUSCULAR | Status: AC | PRN
  Administered 2022-04-24 (×2): .5 mg via INTRAVENOUS
  Administered 2022-04-24: 1 mg via INTRAVENOUS

## 2022-04-24 MED ORDER — CEFAZOLIN SODIUM-DEXTROSE 2-4 GM/100ML-% IV SOLN
INTRAVENOUS | Status: AC
  Filled 2022-04-24: qty 100

## 2022-04-24 NOTE — Procedures (Signed)
Interventional Radiology Procedure Note  Procedure: Gastrostomy tube placement  Complications: None  Estimated Blood Loss: < 10 mL  Findings: 20 Fr bumper retention gastrostomy tube placed with tip in body of stomach. OK to use in 24 hours.  Kennis Buell T. Apphia Cropley, M.D Pager:  [phone]   

## 2022-04-25 NOTE — Progress Notes (Signed)
    Percutaneous gastric tube placed in IR 7/24 Site is clean and dry NT No bleeding No sign of infection Afeb  May use G tube now Note placed in [facility] chart

## 2022-04-26 ENCOUNTER — Other Ambulatory Visit (HOSPITAL_COMMUNITY): Payer: Medicaid Other

## 2022-04-26 LAB — COMPREHENSIVE METABOLIC PANEL
ALT: 22 U/L (ref 0–44)
AST: 15 U/L (ref 15–41)
Albumin: 2.9 g/dL — ABNORMAL LOW (ref 3.5–5.0)
Alkaline Phosphatase: 86 U/L (ref 38–126)
Anion gap: 6 (ref 5–15)
BUN: 12 mg/dL (ref 6–20)
CO2: 27 mmol/L (ref 22–32)
Calcium: 10.1 mg/dL (ref 8.9–10.3)
Chloride: 102 mmol/L (ref 98–111)
Creatinine, Ser: 0.86 mg/dL (ref 0.61–1.24)
GFR, Estimated: >60 mL/min (ref >60)
Glucose, Bld: 138 mg/dL — ABNORMAL HIGH (ref 70–99)
Potassium: 4 mmol/L (ref 3.5–5.1)
Sodium: 135 mmol/L (ref 135–145)
Total Bilirubin: 0.3 mg/dL (ref 0.3–1.2)
Total Protein: 6.9 g/dL (ref 6.5–8.1)

## 2022-04-26 LAB — MAGNESIUM: Magnesium: 1.6 mg/dL — ABNORMAL LOW (ref 1.7–2.4)

## 2022-04-26 LAB — CBC
HCT: 29.7 % — ABNORMAL LOW (ref 39.0–52.0)
Hemoglobin: 9.6 g/dL — ABNORMAL LOW (ref 13.0–17.0)
MCH: 27 pg (ref 26.0–34.0)
MCHC: 32.3 g/dL (ref 30.0–36.0)
MCV: 83.4 fL (ref 80.0–100.0)
Platelets: 253 10*3/uL (ref 150–400)
RBC: 3.56 MIL/uL — ABNORMAL LOW (ref 4.22–5.81)
RDW: 15.7 % — ABNORMAL HIGH (ref 11.5–15.5)
WBC: 10.1 10*3/uL (ref 4.0–10.5)
nRBC: 0 % (ref 0.0–0.2)

## 2022-04-26 LAB — URINALYSIS, ROUTINE W REFLEX MICROSCOPIC
Bilirubin Urine: NEGATIVE
Glucose, UA: NEGATIVE mg/dL
Hgb urine dipstick: NEGATIVE
Ketones, ur: NEGATIVE mg/dL
Leukocytes,Ua: NEGATIVE
Nitrite: NEGATIVE
Protein, ur: NEGATIVE mg/dL
Specific Gravity, Urine: 1.012 (ref 1.005–1.030)
pH: 7 (ref 5.0–8.0)

## 2022-04-26 LAB — PHOSPHORUS: Phosphorus: 3.8 mg/dL (ref 2.5–4.6)

## 2022-04-27 ENCOUNTER — Other Ambulatory Visit (HOSPITAL_COMMUNITY): Payer: Medicaid Other

## 2022-04-27 LAB — LACTIC ACID, PLASMA: Lactic Acid, Venous: 0.8 mmol/L (ref 0.5–1.9)

## 2022-04-27 LAB — C-REACTIVE PROTEIN: CRP: 13 mg/dL — ABNORMAL HIGH (ref <1.0)

## 2022-04-27 LAB — SEDIMENTATION RATE: Sed Rate: 92 mm/hr — ABNORMAL HIGH (ref 0–16)

## 2022-04-27 MED ORDER — IOHEXOL 300 MG/ML  SOLN
100.0000 mL | Freq: Once | INTRAMUSCULAR | Status: AC | PRN
  Administered 2022-04-27: 100 mL via INTRAVENOUS

## 2022-04-28 ENCOUNTER — Other Ambulatory Visit (HOSPITAL_COMMUNITY): Payer: Medicaid Other

## 2022-04-28 LAB — URINE CULTURE: Culture: 7000 — AB

## 2022-04-29 LAB — COMPREHENSIVE METABOLIC PANEL
ALT: 49 U/L — ABNORMAL HIGH (ref 0–44)
AST: 36 U/L (ref 15–41)
Albumin: 2.6 g/dL — ABNORMAL LOW (ref 3.5–5.0)
Alkaline Phosphatase: 94 U/L (ref 38–126)
Anion gap: 8 (ref 5–15)
BUN: 10 mg/dL (ref 6–20)
CO2: 26 mmol/L (ref 22–32)
Calcium: 10.3 mg/dL (ref 8.9–10.3)
Chloride: 102 mmol/L (ref 98–111)
Creatinine, Ser: 0.79 mg/dL (ref 0.61–1.24)
GFR, Estimated: >60 mL/min (ref >60)
Glucose, Bld: 148 mg/dL — ABNORMAL HIGH (ref 70–99)
Potassium: 4.5 mmol/L (ref 3.5–5.1)
Sodium: 136 mmol/L (ref 135–145)
Total Bilirubin: 0.8 mg/dL (ref 0.3–1.2)
Total Protein: 7 g/dL (ref 6.5–8.1)

## 2022-04-29 LAB — CBC
HCT: 28.5 % — ABNORMAL LOW (ref 39.0–52.0)
Hemoglobin: 9.1 g/dL — ABNORMAL LOW (ref 13.0–17.0)
MCH: 26.5 pg (ref 26.0–34.0)
MCHC: 31.9 g/dL (ref 30.0–36.0)
MCV: 83.1 fL (ref 80.0–100.0)
Platelets: 300 10*3/uL (ref 150–400)
RBC: 3.43 MIL/uL — ABNORMAL LOW (ref 4.22–5.81)
RDW: 15.8 % — ABNORMAL HIGH (ref 11.5–15.5)
WBC: 12.4 10*3/uL — ABNORMAL HIGH (ref 4.0–10.5)
nRBC: 0 % (ref 0.0–0.2)

## 2022-04-29 LAB — VANCOMYCIN, TROUGH: Vancomycin Tr: 12 ug/mL — ABNORMAL LOW (ref 15–20)

## 2022-04-30 LAB — RPR
RPR Ser Ql: REACTIVE — AB
RPR Titer: 1:4 {titer}
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
## 2022-05-01 ENCOUNTER — Other Ambulatory Visit (HOSPITAL_COMMUNITY): Payer: Medicaid Other

## 2022-05-01 LAB — VANCOMYCIN, TROUGH: Vancomycin Tr: 8 ug/mL — ABNORMAL LOW (ref 15–20)

## 2022-05-01 LAB — CULTURE, BLOOD (ROUTINE X 2)
Culture: NO GROWTH
Culture: NO GROWTH
Special Requests: ADEQUATE
Special Requests: ADEQUATE

## 2022-05-01 LAB — T.PALLIDUM AB, TOTAL: T Pallidum Abs: REACTIVE — AB

## 2022-05-02 LAB — BASIC METABOLIC PANEL
Anion gap: 8 (ref 5–15)
BUN: 12 mg/dL (ref 6–20)
CO2: 28 mmol/L (ref 22–32)
Calcium: 10.8 mg/dL — ABNORMAL HIGH (ref 8.9–10.3)
Chloride: 104 mmol/L (ref 98–111)
Creatinine, Ser: 0.96 mg/dL (ref 0.61–1.24)
GFR, Estimated: >60 mL/min (ref >60)
Glucose, Bld: 148 mg/dL — ABNORMAL HIGH (ref 70–99)
Potassium: 4 mmol/L (ref 3.5–5.1)
Sodium: 140 mmol/L (ref 135–145)

## 2022-05-02 LAB — CBC
HCT: 26.6 % — ABNORMAL LOW (ref 39.0–52.0)
Hemoglobin: 8.1 g/dL — ABNORMAL LOW (ref 13.0–17.0)
MCH: 25.8 pg — ABNORMAL LOW (ref 26.0–34.0)
MCHC: 30.5 g/dL (ref 30.0–36.0)
MCV: 84.7 fL (ref 80.0–100.0)
Platelets: 370 10*3/uL (ref 150–400)
RBC: 3.14 MIL/uL — ABNORMAL LOW (ref 4.22–5.81)
RDW: 15.7 % — ABNORMAL HIGH (ref 11.5–15.5)
WBC: 7 10*3/uL (ref 4.0–10.5)
nRBC: 0 % (ref 0.0–0.2)

## 2022-05-02 LAB — MAGNESIUM: Magnesium: 1.8 mg/dL (ref 1.7–2.4)

## 2022-05-03 LAB — VANCOMYCIN, TROUGH: Vancomycin Tr: 10 ug/mL — ABNORMAL LOW (ref 15–20)

## 2022-05-04 LAB — RPR
RPR Ser Ql: REACTIVE — AB
RPR Titer: 1:4 {titer}

## 2022-05-05 ENCOUNTER — Institutional Professional Consult (permissible substitution) (HOSPITAL_COMMUNITY): Payer: Medicaid Other

## 2022-05-05 LAB — T.PALLIDUM AB, TOTAL: T Pallidum Abs: REACTIVE — AB

## 2022-05-05 MED ORDER — LIDOCAINE HCL (PF) 1 % IJ SOLN
5.0000 mL | Freq: Once | INTRAMUSCULAR | Status: AC
  Administered 2022-05-05: 5 mL via INTRADERMAL

## 2022-05-05 NOTE — Progress Notes (Signed)
Pt was brought to fluoro for diagnostic LP. Pt was unable to remain still for procedure.  Pt stated he did not want to continue.  LP was aborted.  Ordering physician made aware.  Pt transported back to room.     Alex Gardener, AGNP-BC 05/05/2022, 2:52 PM
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
## 2022-05-09 ENCOUNTER — Other Ambulatory Visit: Payer: Self-pay

## 2022-05-09 ENCOUNTER — Inpatient Hospital Stay (HOSPITAL_COMMUNITY)
Admission: RE | Admit: 2022-05-09 | Discharge: 2022-05-31 | DRG: 945 | Disposition: A | Discharge status: Home / Self Care | Payer: Medicare Other | Source: Other Acute Inpatient Hospital | Attending: Physical Medicine & Rehabilitation | Admitting: Physical Medicine & Rehabilitation

## 2022-05-09 ENCOUNTER — Encounter (HOSPITAL_COMMUNITY): Payer: Self-pay | Admitting: Physical Medicine & Rehabilitation

## 2022-05-09 DIAGNOSIS — R32 Unspecified urinary incontinence: Secondary | ICD-10-CM | POA: Diagnosis present

## 2022-05-09 DIAGNOSIS — Z79899 Other long term (current) drug therapy: Secondary | ICD-10-CM

## 2022-05-09 DIAGNOSIS — S0240ED Zygomatic fracture, right side, subsequent encounter for fracture with routine healing: Secondary | ICD-10-CM

## 2022-05-09 DIAGNOSIS — F17210 Nicotine dependence, cigarettes, uncomplicated: Secondary | ICD-10-CM | POA: Diagnosis present

## 2022-05-09 DIAGNOSIS — Z888 Allergy status to other drugs, medicaments and biological substances status: Secondary | ICD-10-CM

## 2022-05-09 DIAGNOSIS — A523 Neurosyphilis, unspecified: Secondary | ICD-10-CM | POA: Diagnosis present

## 2022-05-09 DIAGNOSIS — B20 Human immunodeficiency virus [HIV] disease: Secondary | ICD-10-CM | POA: Diagnosis present

## 2022-05-09 DIAGNOSIS — R451 Restlessness and agitation: Secondary | ICD-10-CM | POA: Diagnosis present

## 2022-05-09 DIAGNOSIS — Z21 Asymptomatic human immunodeficiency virus [HIV] infection status: Secondary | ICD-10-CM | POA: Diagnosis not present

## 2022-05-09 DIAGNOSIS — Z6820 Body mass index (BMI) 20.0-20.9, adult: Secondary | ICD-10-CM | POA: Diagnosis not present

## 2022-05-09 DIAGNOSIS — S0531XD Ocular laceration without prolapse or loss of intraocular tissue, right eye, subsequent encounter: Secondary | ICD-10-CM

## 2022-05-09 DIAGNOSIS — S062XAD Diffuse traumatic brain injury with loss of consciousness status unknown, subsequent encounter: Secondary | ICD-10-CM | POA: Diagnosis not present

## 2022-05-09 DIAGNOSIS — I10 Essential (primary) hypertension: Secondary | ICD-10-CM | POA: Diagnosis present

## 2022-05-09 DIAGNOSIS — R1310 Dysphagia, unspecified: Secondary | ICD-10-CM | POA: Diagnosis present

## 2022-05-09 DIAGNOSIS — Z91018 Allergy to other foods: Secondary | ICD-10-CM

## 2022-05-09 DIAGNOSIS — S0285XD Fracture of orbit, unspecified, subsequent encounter for fracture with routine healing: Secondary | ICD-10-CM

## 2022-05-09 DIAGNOSIS — D75839 Thrombocytosis, unspecified: Secondary | ICD-10-CM | POA: Diagnosis present

## 2022-05-09 DIAGNOSIS — R5383 Other fatigue: Secondary | ICD-10-CM | POA: Diagnosis not present

## 2022-05-09 DIAGNOSIS — E46 Unspecified protein-calorie malnutrition: Secondary | ICD-10-CM | POA: Diagnosis present

## 2022-05-09 DIAGNOSIS — E119 Type 2 diabetes mellitus without complications: Secondary | ICD-10-CM | POA: Diagnosis present

## 2022-05-09 DIAGNOSIS — Z794 Long term (current) use of insulin: Secondary | ICD-10-CM | POA: Diagnosis not present

## 2022-05-09 DIAGNOSIS — R339 Retention of urine, unspecified: Secondary | ICD-10-CM | POA: Diagnosis present

## 2022-05-09 DIAGNOSIS — E440 Moderate protein-calorie malnutrition: Secondary | ICD-10-CM | POA: Diagnosis present

## 2022-05-09 DIAGNOSIS — R410 Disorientation, unspecified: Secondary | ICD-10-CM | POA: Diagnosis present

## 2022-05-09 DIAGNOSIS — S069X9S Unspecified intracranial injury with loss of consciousness of unspecified duration, sequela: Secondary | ICD-10-CM | POA: Diagnosis not present

## 2022-05-09 DIAGNOSIS — Z781 Physical restraint status: Secondary | ICD-10-CM

## 2022-05-09 DIAGNOSIS — R1312 Dysphagia, oropharyngeal phase: Secondary | ICD-10-CM | POA: Diagnosis not present

## 2022-05-09 DIAGNOSIS — R159 Full incontinence of feces: Secondary | ICD-10-CM | POA: Diagnosis present

## 2022-05-09 DIAGNOSIS — I479 Paroxysmal tachycardia, unspecified: Secondary | ICD-10-CM | POA: Diagnosis present

## 2022-05-09 DIAGNOSIS — S069X0A Unspecified intracranial injury without loss of consciousness, initial encounter: Secondary | ICD-10-CM

## 2022-05-09 DIAGNOSIS — S36409D Unspecified injury of unspecified part of small intestine, subsequent encounter: Secondary | ICD-10-CM

## 2022-05-09 DIAGNOSIS — S2232XD Fracture of one rib, left side, subsequent encounter for fracture with routine healing: Secondary | ICD-10-CM

## 2022-05-09 DIAGNOSIS — R509 Fever, unspecified: Secondary | ICD-10-CM | POA: Diagnosis not present

## 2022-05-09 DIAGNOSIS — D649 Anemia, unspecified: Secondary | ICD-10-CM | POA: Diagnosis present

## 2022-05-09 DIAGNOSIS — S069X0S Unspecified intracranial injury without loss of consciousness, sequela: Secondary | ICD-10-CM | POA: Diagnosis not present

## 2022-05-09 DIAGNOSIS — R4189 Other symptoms and signs involving cognitive functions and awareness: Secondary | ICD-10-CM | POA: Diagnosis present

## 2022-05-09 DIAGNOSIS — S069XAA Unspecified intracranial injury with loss of consciousness status unknown, initial encounter: Secondary | ICD-10-CM | POA: Diagnosis present

## 2022-05-09 DIAGNOSIS — R000 Tachycardia, unspecified: Secondary | ICD-10-CM | POA: Diagnosis not present

## 2022-05-09 DIAGNOSIS — S7291XD Unspecified fracture of right femur, subsequent encounter for closed fracture with routine healing: Secondary | ICD-10-CM

## 2022-05-09 DIAGNOSIS — Z931 Gastrostomy status: Secondary | ICD-10-CM

## 2022-05-09 DIAGNOSIS — S062X4S Diffuse traumatic brain injury with loss of consciousness of 6 hours to 24 hours, sequela: Secondary | ICD-10-CM | POA: Diagnosis not present

## 2022-05-09 DIAGNOSIS — S37042D Minor laceration of left kidney, subsequent encounter: Secondary | ICD-10-CM

## 2022-05-09 DIAGNOSIS — G90A Postural orthostatic tachycardia syndrome (POTS): Secondary | ICD-10-CM | POA: Diagnosis not present

## 2022-05-09 DIAGNOSIS — S069X3S Unspecified intracranial injury with loss of consciousness of 1 hour to 5 hours 59 minutes, sequela: Secondary | ICD-10-CM | POA: Diagnosis not present

## 2022-05-09 LAB — MAGNESIUM: Magnesium: 2 mg/dL (ref 1.7–2.4)

## 2022-05-09 LAB — CBC
HCT: 35.4 % — ABNORMAL LOW (ref 39.0–52.0)
Hemoglobin: 10.9 g/dL — ABNORMAL LOW (ref 13.0–17.0)
MCH: 26.6 pg (ref 26.0–34.0)
MCHC: 30.8 g/dL (ref 30.0–36.0)
MCV: 86.3 fL (ref 80.0–100.0)
Platelets: 494 10*3/uL — ABNORMAL HIGH (ref 150–400)
RBC: 4.1 MIL/uL — ABNORMAL LOW (ref 4.22–5.81)
RDW: 16.8 % — ABNORMAL HIGH (ref 11.5–15.5)
WBC: 11.4 10*3/uL — ABNORMAL HIGH (ref 4.0–10.5)
nRBC: 0 % (ref 0.0–0.2)

## 2022-05-09 LAB — COMPREHENSIVE METABOLIC PANEL
ALT: 28 U/L (ref 0–44)
AST: 14 U/L — ABNORMAL LOW (ref 15–41)
Albumin: 3.3 g/dL — ABNORMAL LOW (ref 3.5–5.0)
Alkaline Phosphatase: 90 U/L (ref 38–126)
Anion gap: 11 (ref 5–15)
BUN: 21 mg/dL — ABNORMAL HIGH (ref 6–20)
CO2: 28 mmol/L (ref 22–32)
Calcium: 11.3 mg/dL — ABNORMAL HIGH (ref 8.9–10.3)
Chloride: 102 mmol/L (ref 98–111)
Creatinine, Ser: 1 mg/dL (ref 0.61–1.24)
GFR, Estimated: >60 mL/min (ref >60)
Glucose, Bld: 131 mg/dL — ABNORMAL HIGH (ref 70–99)
Potassium: 4.4 mmol/L (ref 3.5–5.1)
Sodium: 141 mmol/L (ref 135–145)
Total Bilirubin: 0.2 mg/dL — ABNORMAL LOW (ref 0.3–1.2)
Total Protein: 7.9 g/dL (ref 6.5–8.1)

## 2022-05-09 LAB — GLUCOSE, CAPILLARY
Glucose-Capillary: 118 mg/dL — ABNORMAL HIGH (ref 70–99)
Glucose-Capillary: 166 mg/dL — ABNORMAL HIGH (ref 70–99)

## 2022-05-09 LAB — PHOSPHORUS: Phosphorus: 4.1 mg/dL (ref 2.5–4.6)

## 2022-05-09 MED ORDER — METHOCARBAMOL 500 MG PO TABS
500.0000 mg | ORAL_TABLET | Freq: Four times a day (QID) | ORAL | Status: DC | PRN
  Administered 2022-05-12: 500 mg via ORAL
  Filled 2022-05-09: qty 1

## 2022-05-09 MED ORDER — TRAZODONE HCL 50 MG PO TABS
25.0000 mg | ORAL_TABLET | Freq: Every evening | ORAL | Status: DC | PRN
  Administered 2022-05-12: 50 mg via ORAL
  Filled 2022-05-09: qty 1

## 2022-05-09 MED ORDER — EMTRICITABINE-TENOFOVIR AF 200-25 MG PO TABS
1.0000 | ORAL_TABLET | Freq: Every day | ORAL | Status: DC
  Administered 2022-05-10 – 2022-05-23 (×14): 1
  Filled 2022-05-09 (×14): qty 1

## 2022-05-09 MED ORDER — DILTIAZEM HCL 90 MG PO TABS
90.0000 mg | ORAL_TABLET | Freq: Two times a day (BID) | ORAL | Status: DC
  Administered 2022-05-09 – 2022-05-31 (×43): 90 mg
  Filled 2022-05-09 (×47): qty 1

## 2022-05-09 MED ORDER — GUAIFENESIN-DM 100-10 MG/5ML PO SYRP: 5.0000 mL | ORAL_SOLUTION | Freq: Four times a day (QID) | ORAL | Status: DC | PRN

## 2022-05-09 MED ORDER — GLUCERNA 1.2 CAL PO LIQD
1000.0000 mL | ORAL | Status: AC
  Administered 2022-05-09: 1000 mL
  Filled 2022-05-09: qty 1000

## 2022-05-09 MED ORDER — PROSOURCE TF20 ENFIT COMPATIBL EN LIQD
60.0000 mL | Freq: Two times a day (BID) | ENTERAL | Status: DC
Start: 2022-05-09 — End: 2022-05-10
  Administered 2022-05-09 – 2022-05-10 (×2): 60 mL
  Filled 2022-05-09 (×4): qty 60

## 2022-05-09 MED ORDER — PROCHLORPERAZINE EDISYLATE 10 MG/2ML IJ SOLN: 5.0000 mg | Freq: Four times a day (QID) | INTRAMUSCULAR | Status: DC | PRN

## 2022-05-09 MED ORDER — DEXTROSE 5 % IV SOLN: 2.0000 10*6.[IU] | INTRAVENOUS | Status: DC

## 2022-05-09 MED ORDER — ATORVASTATIN CALCIUM 40 MG PO TABS
40.0000 mg | ORAL_TABLET | Freq: Every day | ORAL | Status: DC
  Administered 2022-05-09 – 2022-05-22 (×14): 40 mg
  Filled 2022-05-09 (×15): qty 1

## 2022-05-09 MED ORDER — GLUCERNA 1.2 CAL PO LIQD
1000.0000 mL | ORAL | Status: DC
Start: 2022-05-10 — End: 2022-05-10
  Filled 2022-05-09: qty 1000

## 2022-05-09 MED ORDER — METOPROLOL TARTRATE 25 MG PO TABS
25.0000 mg | ORAL_TABLET | Freq: Two times a day (BID) | ORAL | Status: DC
  Administered 2022-05-09 – 2022-05-23 (×28): 25 mg
  Filled 2022-05-09 (×28): qty 1

## 2022-05-09 MED ORDER — FOLIC ACID 1 MG PO TABS
1.0000 mg | ORAL_TABLET | Freq: Every day | ORAL | Status: DC
  Administered 2022-05-10 – 2022-05-13 (×4): 1 mg via ORAL
  Filled 2022-05-09 (×4): qty 1

## 2022-05-09 MED ORDER — INSULIN ASPART 100 UNIT/ML IJ SOLN
0.0000 [IU] | Freq: Three times a day (TID) | INTRAMUSCULAR | Status: DC
  Administered 2022-05-10: 2 [IU] via SUBCUTANEOUS
  Administered 2022-05-10 (×2): 3 [IU] via SUBCUTANEOUS
  Administered 2022-05-11 (×2): 5 [IU] via SUBCUTANEOUS
  Administered 2022-05-11 – 2022-05-12 (×2): 3 [IU] via SUBCUTANEOUS
  Administered 2022-05-12 – 2022-05-13 (×2): 2 [IU] via SUBCUTANEOUS
  Administered 2022-05-13: 3 [IU] via SUBCUTANEOUS
  Administered 2022-05-13 – 2022-05-14 (×2): 2 [IU] via SUBCUTANEOUS
  Administered 2022-05-14: 5 [IU] via SUBCUTANEOUS
  Administered 2022-05-14 – 2022-05-15 (×3): 2 [IU] via SUBCUTANEOUS
  Administered 2022-05-16: 5 [IU] via SUBCUTANEOUS
  Administered 2022-05-16: 3 [IU] via SUBCUTANEOUS
  Administered 2022-05-16: 2 [IU] via SUBCUTANEOUS
  Administered 2022-05-17: 3 [IU] via SUBCUTANEOUS
  Administered 2022-05-17: 2 [IU] via SUBCUTANEOUS
  Administered 2022-05-17: 3 [IU] via SUBCUTANEOUS
  Administered 2022-05-18 – 2022-05-21 (×5): 2 [IU] via SUBCUTANEOUS

## 2022-05-09 MED ORDER — GLUCERNA 1.2 CAL PO LIQD
1000.0000 mL | ORAL | Status: DC
Start: 2022-05-09 — End: 2022-05-09
  Filled 2022-05-09: qty 1000

## 2022-05-09 MED ORDER — BISACODYL 10 MG RE SUPP: 10.0000 mg | Freq: Every day | RECTAL | Status: DC | PRN

## 2022-05-09 MED ORDER — INSULIN GLARGINE-YFGN 100 UNIT/ML ~~LOC~~ SOLN
18.0000 [IU] | Freq: Two times a day (BID) | SUBCUTANEOUS | Status: DC
  Administered 2022-05-09 – 2022-05-11 (×4): 18 [IU] via SUBCUTANEOUS
  Filled 2022-05-09 (×6): qty 0.18

## 2022-05-09 MED ORDER — LORAZEPAM 0.5 MG PO TABS
0.5000 mg | ORAL_TABLET | Freq: Two times a day (BID) | ORAL | Status: DC | PRN
Start: 2022-05-09 — End: 2022-05-23
  Administered 2022-05-13: 0.5 mg
  Filled 2022-05-09 (×2): qty 1

## 2022-05-09 MED ORDER — ACETAMINOPHEN 325 MG PO TABS
325.0000 mg | ORAL_TABLET | ORAL | Status: DC | PRN
  Administered 2022-05-10 – 2022-05-12 (×2): 650 mg via ORAL
  Filled 2022-05-09 (×2): qty 2

## 2022-05-09 MED ORDER — DOLUTEGRAVIR SODIUM 50 MG PO TABS
50.0000 mg | ORAL_TABLET | Freq: Every day | ORAL | Status: DC
Start: 2022-05-10 — End: 2022-05-23
  Administered 2022-05-10 – 2022-05-23 (×14): 50 mg
  Filled 2022-05-09 (×14): qty 1

## 2022-05-09 MED ORDER — GABAPENTIN 250 MG/5ML PO SOLN
100.0000 mg | Freq: Two times a day (BID) | ORAL | Status: DC
  Administered 2022-05-09 – 2022-05-23 (×28): 100 mg
  Filled 2022-05-09 (×30): qty 2

## 2022-05-09 MED ORDER — ERYTHROMYCIN 5 MG/GM OP OINT
TOPICAL_OINTMENT | Freq: Two times a day (BID) | OPHTHALMIC | Status: DC
  Administered 2022-05-13 – 2022-05-21 (×5): 1 via OPHTHALMIC
  Filled 2022-05-09: qty 3.5

## 2022-05-09 MED ORDER — MAGNESIUM HYDROXIDE 400 MG/5ML PO SUSP: 30.0000 mL | Freq: Every day | ORAL | Status: DC | PRN

## 2022-05-09 MED ORDER — MAGNESIUM OXIDE -MG SUPPLEMENT 400 (240 MG) MG PO TABS
400.0000 mg | ORAL_TABLET | Freq: Every day | ORAL | Status: DC
  Administered 2022-05-10 – 2022-05-22 (×13): 400 mg
  Filled 2022-05-09 (×13): qty 1

## 2022-05-09 MED ORDER — TAB-A-VITE/IRON PO TABS
1.0000 | ORAL_TABLET | Freq: Every day | ORAL | Status: DC
  Administered 2022-05-10 – 2022-05-30 (×21): 1 via ORAL
  Filled 2022-05-09 (×23): qty 1

## 2022-05-09 MED ORDER — PENICILLIN G POTASSIUM 20000000 UNITS IJ SOLR
4.0000 10*6.[IU] | INTRAVENOUS | Status: AC
  Administered 2022-05-09 – 2022-05-12 (×19): 4 10*6.[IU] via INTRAVENOUS
  Filled 2022-05-09 (×20): qty 4

## 2022-05-09 MED ORDER — SERTRALINE HCL 20 MG/ML PO CONC
25.0000 mg | Freq: Every day | ORAL | Status: DC
  Administered 2022-05-10 – 2022-05-13 (×4): 25 mg via ORAL
  Filled 2022-05-09 (×4): qty 1.25

## 2022-05-09 MED ORDER — PROCHLORPERAZINE MALEATE 5 MG PO TABS: 5.0000 mg | ORAL_TABLET | Freq: Four times a day (QID) | ORAL | Status: DC | PRN

## 2022-05-09 MED ORDER — CIPROFLOXACIN HCL 0.3 % OP SOLN
1.0000 [drp] | Freq: Four times a day (QID) | OPHTHALMIC | Status: DC
  Administered 2022-05-09 – 2022-05-31 (×80): 1 [drp] via OPHTHALMIC
  Filled 2022-05-09 (×2): qty 2.5

## 2022-05-09 MED ORDER — ATROPINE SULFATE 1 % OP SOLN
1.0000 [drp] | Freq: Two times a day (BID) | OPHTHALMIC | Status: DC
  Administered 2022-05-09 – 2022-05-31 (×41): 1 [drp] via OPHTHALMIC
  Filled 2022-05-09 (×2): qty 2

## 2022-05-09 MED ORDER — POLYETHYLENE GLYCOL 3350 17 G PO PACK
17.0000 g | PACK | Freq: Every day | ORAL | Status: DC
  Administered 2022-05-12 – 2022-05-23 (×10): 17 g
  Filled 2022-05-09 (×12): qty 1

## 2022-05-09 MED ORDER — THIAMINE MONONITRATE 100 MG PO TABS
100.0000 mg | ORAL_TABLET | Freq: Every day | ORAL | Status: DC
  Administered 2022-05-10 – 2022-05-23 (×14): 100 mg
  Filled 2022-05-09 (×28): qty 1

## 2022-05-09 MED ORDER — PREDNISOLONE ACETATE 1 % OP SUSP
1.0000 [drp] | Freq: Four times a day (QID) | OPHTHALMIC | Status: DC
Start: 2022-05-09 — End: 2022-05-31
  Administered 2022-05-09 – 2022-05-31 (×81): 1 [drp] via OPHTHALMIC
  Filled 2022-05-09: qty 5

## 2022-05-09 MED ORDER — FLEET ENEMA 7-19 GM/118ML RE ENEM: 1.0000 | ENEMA | Freq: Once | RECTAL | Status: DC | PRN

## 2022-05-09 MED ORDER — ALUM & MAG HYDROXIDE-SIMETH 200-200-20 MG/5ML PO SUSP: 30.0000 mL | ORAL | Status: DC | PRN

## 2022-05-09 MED ORDER — FAMOTIDINE 20 MG PO TABS
20.0000 mg | ORAL_TABLET | Freq: Every day | ORAL | Status: DC
  Administered 2022-05-10 – 2022-05-23 (×14): 20 mg
  Filled 2022-05-09 (×14): qty 1

## 2022-05-09 MED ORDER — OLANZAPINE 10 MG PO TABS
10.0000 mg | ORAL_TABLET | Freq: Every day | ORAL | Status: DC
  Administered 2022-05-10 – 2022-05-22 (×13): 10 mg
  Filled 2022-05-09 (×13): qty 1

## 2022-05-09 MED ORDER — METHYLPHENIDATE HCL 5 MG PO TABS
5.0000 mg | ORAL_TABLET | Freq: Two times a day (BID) | ORAL | Status: DC
  Administered 2022-05-10 – 2022-05-16 (×13): 5 mg
  Filled 2022-05-09 (×13): qty 1

## 2022-05-09 MED ORDER — PROCHLORPERAZINE 25 MG RE SUPP: 12.5000 mg | Freq: Four times a day (QID) | RECTAL | Status: DC | PRN
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
## 2022-05-09 NOTE — Progress Notes (Signed)
Inpatient Rehabilitation Admission Medication Review by a Pharmacist  A complete drug regimen review was completed for this patient to identify any potential clinically significant medication issues.  High Risk Drug Classes Is patient taking? Indication by Medication  Antipsychotic Yes Zyprexa - psychosis Compazine - N/V  Anticoagulant No   Antibiotic Yes PCN - neurosyphilis  Opioid No   Antiplatelet No   Hypoglycemics/insulin Yes SSI/Semglee - DM  Vasoactive Medication Yes Metoprolol/diltiazem - HTN  Chemotherapy No   Other Yes Lipitor - HLD Tivicay/Descovy - HIV Robaxin - spasms Zoloft - depression Famotidine - reflux Ciloxan/erythromycin/prednisone/atropine eye drops  - infection/allergy     Type of Medication Issue Identified Description of Issue Recommendation(s)  Drug Interaction(s) (clinically significant)  Tivicay and Tube feeds D/w Wendi Maya - changed timing  Duplicate Therapy     Allergy     No Medication Administration End Date     Incorrect Dose  PCN at inappropriate dose prior to transfer.  D/w Wendi Maya - increase to appropriate dose and added end date  Additional Drug Therapy Needed     Significant med changes from prior encounter (inform family/care partners about these prior to discharge).    Other       Clinically significant medication issues were identified that warrant physician communication and completion of prescribed/recommended actions by midnight of the next day:  No  Name of provider notified for urgent issues identified: Wendi Maya, PA  Provider Method of Notification: Secure Chat    Pharmacist comments:   Time spent performing this drug regimen review (minutes):  30   Ulyses Southward, PharmD, [place], AAHIVP, CPP Infectious Disease Pharmacist 05/09/2022 6:57 PM

## 2022-05-09 NOTE — Progress Notes (Signed)
Pt tx from [facility]. He has been on PCN 2 million units q4 for suspected neurosyphillis. D/w Wendi Maya and we will change the dose to 4 million units. Add stop date of 05/12/22. We will also change tube feeds to hold 1 hr before giving tivicay and 2 hrs after to avoid interaction with cations in TF.  Ulyses Southward, PharmD, BCIDP, AAHIVP, CPP Infectious Disease Pharmacist 05/09/2022 5:25 PM
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
## 2022-05-10 ENCOUNTER — Ambulatory Visit (HOSPITAL_COMMUNITY): Payer: Medicare Other

## 2022-05-10 DIAGNOSIS — S069X9S Unspecified intracranial injury with loss of consciousness of unspecified duration, sequela: Secondary | ICD-10-CM | POA: Diagnosis not present

## 2022-05-10 DIAGNOSIS — E119 Type 2 diabetes mellitus without complications: Secondary | ICD-10-CM | POA: Diagnosis not present

## 2022-05-10 DIAGNOSIS — R1312 Dysphagia, oropharyngeal phase: Secondary | ICD-10-CM | POA: Diagnosis not present

## 2022-05-10 DIAGNOSIS — Z21 Asymptomatic human immunodeficiency virus [HIV] infection status: Secondary | ICD-10-CM | POA: Diagnosis not present

## 2022-05-10 DIAGNOSIS — S062X4S Diffuse traumatic brain injury with loss of consciousness of 6 hours to 24 hours, sequela: Secondary | ICD-10-CM | POA: Diagnosis not present

## 2022-05-10 LAB — CBC WITH DIFFERENTIAL/PLATELET
Abs Immature Granulocytes: 0.02 10*3/uL (ref 0.00–0.07)
Basophils Absolute: 0 10*3/uL (ref 0.0–0.1)
Basophils Relative: 0 %
Eosinophils Absolute: 0.1 10*3/uL (ref 0.0–0.5)
Eosinophils Relative: 1 %
HCT: 34.9 % — ABNORMAL LOW (ref 39.0–52.0)
Hemoglobin: 10.8 g/dL — ABNORMAL LOW (ref 13.0–17.0)
Immature Granulocytes: 0 %
Lymphocytes Relative: 32 %
Lymphs Abs: 3 10*3/uL (ref 0.7–4.0)
MCH: 26.5 pg (ref 26.0–34.0)
MCHC: 30.9 g/dL (ref 30.0–36.0)
MCV: 85.7 fL (ref 80.0–100.0)
Monocytes Absolute: 0.7 10*3/uL (ref 0.1–1.0)
Monocytes Relative: 7 %
Neutro Abs: 5.7 10*3/uL (ref 1.7–7.7)
Neutrophils Relative %: 60 %
Platelets: 487 10*3/uL — ABNORMAL HIGH (ref 150–400)
RBC: 4.07 MIL/uL — ABNORMAL LOW (ref 4.22–5.81)
RDW: 17 % — ABNORMAL HIGH (ref 11.5–15.5)
WBC: 9.5 10*3/uL (ref 4.0–10.5)
nRBC: 0 % (ref 0.0–0.2)

## 2022-05-10 LAB — COMPREHENSIVE METABOLIC PANEL
ALT: 27 U/L (ref 0–44)
AST: 15 U/L (ref 15–41)
Albumin: 3.3 g/dL — ABNORMAL LOW (ref 3.5–5.0)
Alkaline Phosphatase: 90 U/L (ref 38–126)
Anion gap: 9 (ref 5–15)
BUN: 21 mg/dL — ABNORMAL HIGH (ref 6–20)
CO2: 30 mmol/L (ref 22–32)
Calcium: 11.5 mg/dL — ABNORMAL HIGH (ref 8.9–10.3)
Chloride: 100 mmol/L (ref 98–111)
Creatinine, Ser: 0.94 mg/dL (ref 0.61–1.24)
GFR, Estimated: >60 mL/min (ref >60)
Glucose, Bld: 161 mg/dL — ABNORMAL HIGH (ref 70–99)
Potassium: 3.9 mmol/L (ref 3.5–5.1)
Sodium: 139 mmol/L (ref 135–145)
Total Bilirubin: 0.3 mg/dL (ref 0.3–1.2)
Total Protein: 8 g/dL (ref 6.5–8.1)

## 2022-05-10 LAB — GLUCOSE, CAPILLARY
Glucose-Capillary: 122 mg/dL — ABNORMAL HIGH (ref 70–99)
Glucose-Capillary: 138 mg/dL — ABNORMAL HIGH (ref 70–99)
Glucose-Capillary: 140 mg/dL — ABNORMAL HIGH (ref 70–99)
Glucose-Capillary: 140 mg/dL — ABNORMAL HIGH (ref 70–99)
Glucose-Capillary: 158 mg/dL — ABNORMAL HIGH (ref 70–99)
Glucose-Capillary: 177 mg/dL — ABNORMAL HIGH (ref 70–99)

## 2022-05-10 MED ORDER — FREE WATER
200.0000 mL | Freq: Two times a day (BID) | Status: DC
Start: 2022-05-10 — End: 2022-05-12
  Administered 2022-05-10 – 2022-05-12 (×5): 200 mL

## 2022-05-10 MED ORDER — PROSOURCE TF20 ENFIT COMPATIBL EN LIQD
60.0000 mL | Freq: Every day | ENTERAL | Status: DC
Start: 2022-05-10 — End: 2022-05-12
  Administered 2022-05-10 – 2022-05-12 (×3): 60 mL
  Filled 2022-05-10 (×3): qty 60

## 2022-05-10 MED ORDER — JEVITY 1.5 CAL/FIBER PO LIQD
1000.0000 mL | ORAL | Status: DC
  Administered 2022-05-10 – 2022-05-11 (×2): 1000 mL
  Filled 2022-05-10 (×6): qty 1000
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
## 2022-05-10 NOTE — Plan of Care (Addendum)
  Problem: RH Swallowing Goal: LTG Patient will consume least restrictive diet using compensatory strategies with assistance (SLP) Description: LTG:  Patient will consume least restrictive diet using compensatory strategies with assistance (SLP) Flowsheets (Taken 05/10/2022 1147) LTG: Pt Patient will consume least restrictive diet using compensatory strategies with assistance of (SLP): Minimal Assistance - Patient > 75% Goal: LTG Patient will participate in dysphagia therapy to increase swallow function with assistance (SLP) Description: LTG:  Patient will participate in dysphagia therapy to increase swallow function with assistance (SLP) Flowsheets (Taken 05/10/2022 1147) LTG: Pt will participate in dysphagia therapy to increase swallow function with assistance of (SLP): Minimal Assistance - Patient > 75% Goal: LTG Pt will demonstrate functional change in swallow as evidenced by bedside/clinical objective assessment (SLP) Description: LTG: Patient will demonstrate functional change in swallow as evidenced by bedside/clinical objective assessment (SLP) Flowsheets (Taken 05/10/2022 1147) LTG: Patient will demonstrate functional change in swallow as evidenced by bedside/clinical objective assessment: Oropharyngeal swallow   Problem: RH Problem Solving Goal: LTG Patient will demonstrate problem solving for (SLP) Description: LTG:  Patient will demonstrate problem solving for basic/complex daily situations with cues  (SLP) Flowsheets (Taken 05/10/2022 1147) LTG: Patient will demonstrate problem solving for (SLP): Basic daily situations LTG Patient will demonstrate problem solving for: Minimal Assistance - Patient > 75%   Problem: RH Memory Goal: LTG Patient will use memory compensatory aids to (SLP) Description: LTG:  Patient will use memory compensatory aids to recall biographical/new, daily complex information with cues (SLP) Flowsheets (Taken 05/10/2022 1147) LTG: Patient will use memory compensatory  aids to (SLP): Moderate Assistance - Patient 50 - 74%   Problem: RH Attention Goal: LTG Patient will demonstrate this level of attention during functional activites (SLP) Description: LTG:  Patient will will demonstrate this level of attention during functional activites (SLP) Flowsheets (Taken 05/10/2022 1147) Patient will demonstrate during cognitive/linguistic activities the attention type of: Sustained LTG: Patient will demonstrate this level of attention during cognitive/linguistic activities with assistance of (SLP): Minimal Assistance - Patient > 75%   Problem: RH Awareness Goal: LTG: Patient will demonstrate awareness during functional activites type of (SLP) Description: LTG: Patient will demonstrate awareness during functional activites type of (SLP) Flowsheets (Taken 05/10/2022 1147) Patient will demonstrate during cognitive/linguistic activities awareness type of: Intellectual LTG: Patient will demonstrate awareness during cognitive/linguistic activities with assistance of (SLP): Minimal Assistance - Patient > 75%

## 2022-05-10 NOTE — Progress Notes (Addendum)
Pt d/c'd another IV.  Wrist restraints were already on pt.  Looked like he had worked on pulling at them.  This LPN ordered another IV consult.
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
## 2022-05-10 NOTE — Progress Notes (Signed)
Patient received from [facility]. Patient arrived alone on unit and patient was aware of the transfer and family was contacted.   Rito Ehrlich, LPN

## 2022-05-10 NOTE — Progress Notes (Signed)
Updated education binder with TBI book and packet, DM information with how to take CBG and administrating insulin via pen and syringe, blood sugar record for home, HTN, PEG care at home, diet modifications and HLD.  Patient not in room at this time.
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
## 2022-05-10 NOTE — Progress Notes (Signed)
Inpatient Rehabilitation  Patient information reviewed and entered into eRehab system by Hiroki Wint Shakea Isip, OTR/L.   Information including medical coding, functional ability and quality indicators will be reviewed and updated through discharge.    
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
## 2022-05-10 NOTE — Progress Notes (Signed)
Lower extremity venous bilateral study completed.   Please see CV Proc for preliminary results.   Frannie Shedrick, RDMS, RVT  
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
## 2022-05-10 NOTE — Plan of Care (Signed)
  Problem: Sit to Stand Goal: LTG:  Patient will perform sit to stand with assistance level (PT) Description: LTG:  Patient will perform sit to stand with assistance level (PT) Flowsheets (Taken 05/10/2022 1517) LTG: PT will perform sit to stand in preparation for functional mobility with assistance level: Supervision/Verbal cueing   Problem: RH Bed Mobility Goal: LTG Patient will perform bed mobility with assist (PT) Description: LTG: Patient will perform bed mobility with assistance, with/without cues (PT). Flowsheets (Taken 05/10/2022 1517) LTG: Pt will perform bed mobility with assistance level of: Independent   Problem: RH Bed to Chair Transfers Goal: LTG Patient will perform bed/chair transfers w/assist (PT) Description: LTG: Patient will perform bed to chair transfers with assistance (PT). Flowsheets (Taken 05/10/2022 1517) LTG: Pt will perform Bed to Chair Transfers with assistance level: Contact Guard/Touching assist   Problem: RH Car Transfers Goal: LTG Patient will perform car transfers with assist (PT) Description: LTG: Patient will perform car transfers with assistance (PT). Flowsheets (Taken 05/10/2022 1517) LTG: Pt will perform car transfers with assist:: Contact Guard/Touching assist   Problem: RH Ambulation Goal: LTG Patient will ambulate in controlled environment (PT) Description: LTG: Patient will ambulate in a controlled environment, # of feet with assistance (PT). Flowsheets (Taken 05/10/2022 1517) LTG: Pt will ambulate in controlled environ  assist needed:: Contact Guard/Touching assist LTG: Ambulation distance in controlled environment: 150 Goal: LTG Patient will ambulate in home environment (PT) Description: LTG: Patient will ambulate in home environment, # of feet with assistance (PT). Flowsheets (Taken 05/10/2022 1517) LTG: Pt will ambulate in home environ  assist needed:: Contact Guard/Touching assist LTG: Ambulation distance in home environment: 50   Problem: RH  Stairs Goal: LTG Patient will ambulate up and down stairs w/assist (PT) Description: LTG: Patient will ambulate up and down # of stairs with assistance (PT) Flowsheets (Taken 05/10/2022 1517) LTG: Pt will ambulate up/down stairs assist needed:: Contact Guard/Touching assist LTG: Pt will  ambulate up and down number of stairs: 4
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
## 2022-05-11 DIAGNOSIS — E440 Moderate protein-calorie malnutrition: Secondary | ICD-10-CM

## 2022-05-11 DIAGNOSIS — E119 Type 2 diabetes mellitus without complications: Secondary | ICD-10-CM | POA: Diagnosis not present

## 2022-05-11 DIAGNOSIS — R5383 Other fatigue: Secondary | ICD-10-CM

## 2022-05-11 DIAGNOSIS — S069X9S Unspecified intracranial injury with loss of consciousness of unspecified duration, sequela: Secondary | ICD-10-CM | POA: Diagnosis not present

## 2022-05-11 LAB — GLUCOSE, CAPILLARY
Glucose-Capillary: 143 mg/dL — ABNORMAL HIGH (ref 70–99)
Glucose-Capillary: 181 mg/dL — ABNORMAL HIGH (ref 70–99)
Glucose-Capillary: 183 mg/dL — ABNORMAL HIGH (ref 70–99)
Glucose-Capillary: 213 mg/dL — ABNORMAL HIGH (ref 70–99)
Glucose-Capillary: 216 mg/dL — ABNORMAL HIGH (ref 70–99)
Glucose-Capillary: 231 mg/dL — ABNORMAL HIGH (ref 70–99)

## 2022-05-11 MED ORDER — INSULIN GLARGINE-YFGN 100 UNIT/ML ~~LOC~~ SOLN
20.0000 [IU] | Freq: Two times a day (BID) | SUBCUTANEOUS | Status: DC
Start: 2022-05-11 — End: 2022-05-28
  Administered 2022-05-11 – 2022-05-28 (×33): 20 [IU] via SUBCUTANEOUS
  Filled 2022-05-11 (×38): qty 0.2

## 2022-05-11 NOTE — Progress Notes (Signed)
Occupational Therapy Session Note  Patient Details  Name: Zachary Hill MRN: [id number] Date of Birth: 19-Mar-1981  Today's Date: 05/11/2022 OT Individual Time: 1400-1445 OT Individual Time Calculation (min): 45 min    Short Term Goals: Week 1:  OT Short Term Goal 1 (Week 1): Pt will terminate oral care wiht no more than 2 cues OT Short Term Goal 2 (Week 1): Pt will appropriately use ADL items with no cuing to demo improved ideation OT Short Term Goal 3 (Week 1): Pt will complete functional trnasfers wiht CGA OT Short Term Goal 4 (Week 1): Pt will sequence bathing body parts with MOD cuing OT Short Term Goal 5 (Week 1): Pt will don shirt with supervsion  Skilled Therapeutic Interventions/Progress Updates:  Pt resting in bed upon OT arrival with B wrist restraints/mittens in place and calm demeanor. OT provided introduction as novel clinician and RO with use of posted calender with cues to scan to R due to R visual deficits. Pt able to state location but inaccurate on month and year. For safety and falls prevention concerns, OT set up simple self care routine supplies on tray table. With mod vc's pt able to move from supine to sit and perform the following: UB bathing, weight shifts for removal of soiled incontinence brief, doffing slipper socks, simple peri hygiene for front region, lotion application to upper LE's and UB, slipper socks donning, and oral care with suction tooth brush. Pt required mod-max cues via verbal and demo for All else, OT provided max support for. OT redirected several times to maintaining EOB and focus on task as pt easily distracted and tangential. OT added low auditory stim of requested gospel music for high interest request of patient during closing of session. Pt was able to transition between tasks with mod cues and once all completed, pt easily moved from EOB to supine without resistance and min A. Bridging and scooting skills improving. Pt then left with HOB at 30  degrees and restraints/mittens replaced with bed exit engaged and call button and needs in reach.     05/11/22 1400  Observation Details  Observation Environment CIR  Start of observation period - Date 05/11/22  Start of observation period - Time 1400  End of observation period - Date 05/11/22  End of observation period - Time 1445  Agitated Behavior Scale (DO NOT LEAVE BLANKS)  Short attention span, easy distractibility, inability to concentrate 3  Impulsive, impatient, low tolerance for pain or frustration 1  Uncooperative, resistant to care, demanding 1  Violent and/or threatening violence toward people or property 1  Explosive and/or unpredictable anger 1  Rocking, rubbing, moaning, or other self-stimulating behavior 1  Pulling at tubes, restraints, etc. 2  Wandering from treatment areas 1  Restlessness, pacing, excessive movement 1  Repetitive behaviors, motor, and/or verbal 1  Rapid, loud, or excessive talking 1  Sudden changes of mood 1  Easily initiated or excessive crying and/or laughter 1  Self-abusiveness, physical and/or verbal 1  Agitated behavior scale total score 17    Therapy Documentation Precautions:  Precautions Precautions: Fall Precaution Comments: PEG (with abdominal binder covering), unable to fully open R eye, fear/behavioral, impulsive, poor safety awareness Restrictions Weight Bearing Restrictions: No RUE Weight Bearing: Non weight bearing RLE Weight Bearing: Touchdown weight bearing LLE Weight Bearing: Touchdown weight bearing    Therapy/Group: Individual Therapy  Vicenta Dunning 05/11/2022, 6:07 PM

## 2022-05-11 NOTE — Progress Notes (Signed)
Patient ID: Zachary Hill, male   DOB: 08/03/1981, 41 y.o.   MRN: [id number]  SW made contact with pt fiance [name] to inform on ELOS, CAP/DA referral made, and SW will follow-up after team conference. Discussed again scheduling phone interview with social security.   Cecile Sheerer, MSW, LCSWA Office: [phone] Cell: [phone] Fax: [phone]

## 2022-05-11 NOTE — Care Management (Signed)
Inpatient Rehabilitation Center Individual Statement of Services  Patient Name:  Zachary Hill  Date:  05/11/2022  Welcome to the Inpatient Rehabilitation Center.  Our goal is to provide you with an individualized program based on your diagnosis and situation, designed to meet your specific needs.  With this comprehensive rehabilitation program, you will be expected to participate in at least 3 hours of rehabilitation therapies Monday-Friday, with modified therapy programming on the weekends.  Your rehabilitation program will include the following services:  Physical Therapy (PT), Occupational Therapy (OT), Speech Therapy (ST), 24 hour per day rehabilitation nursing, Therapeutic Recreaction (TR), Psychology, Neuropsychology, Care Coordinator, Rehabilitation Medicine, Nutrition Services, Pharmacy Services, and Other  Weekly team conferences will be held on Tuesdays to discuss your progress.  Your Inpatient Rehabilitation Care Coordinator will talk with you frequently to get your input and to update you on team discussions.  Team conferences with you and your family in attendance may also be held.  Expected length of stay: 14-18 days     Overall anticipated outcome: Contact Guard to Supervision  Depending on your progress and recovery, your program may change. Your Inpatient Rehabilitation Care Coordinator will coordinate services and will keep you informed of any changes. Your Inpatient Rehabilitation Care Coordinator's name and contact numbers are listed  below.  The following services may also be recommended but are not provided by the Inpatient Rehabilitation Center:  Driving Evaluations Home Health Rehabiltiation Services Outpatient Rehabilitation Services Vocational Rehabilitation   Arrangements will be made to provide these services after discharge if needed.  Arrangements include referral to agencies that provide these services.  Your insurance has been verified to be:  Medicare  A/B  Your primary doctor is:  Default provider  Pertinent information will be shared with your doctor and your insurance company.  Inpatient Rehabilitation Care Coordinator:  Susie Cassette [phone] or [phone]  Information discussed with and copy given to patient by: Gretchen Short, 05/11/2022, 9:57 AM

## 2022-05-11 NOTE — Progress Notes (Signed)
Speech Language Pathology TBI Note  Patient Details  Name: Zachary Hill MRN: [id number] Date of Birth: 1981-08-11  Today's Date: 05/11/2022 SLP Individual Time: 1030-1130 SLP Individual Time Calculation (min): 60 min  Short Term Goals: Week 1: SLP Short Term Goal 1 (Week 1): Patient will demonstrate sustained attention to functional tasks for 10 minutes with Max verbal cues for redirection. SLP Short Term Goal 2 (Week 1): Patient will demonstrate functional problem solving for basic and familiar tasks with Max A multimodal cues. SLP Short Term Goal 3 (Week 1): Patient will utilize external memory aids for orientation to place, time, and situation with Mod verbal and visual cues. SLP Short Term Goal 4 (Week 1): Patient will verbalize 1 cognitive and 1 physical deficit with Max A multimodal cues. SLP Short Term Goal 5 (Week 1): Patient will participate in repeat MBS to assess swallow function. SLP Short Term Goal 6 (Week 1): Patient will consume trials of ice chips with minimal overt s/s of aspiration with Mod verbal cues for use of swallowing compensatory strategies.  Skilled Therapeutic Interventions: Skilled treatment session focused on cognitive and dysphagia goals. Upon arrival, patient had been incontinent of a large, loose bowel without awareness. SLP facilitated session by providing Min verbal and tactile cues for initiation with bed mobility during peri care. Patient demonstrated improved orientation today and was independently oriented to place, age and situation (brain injury) with total A for orientation to date. An external aid was created to maximize recall and carryover of information. Patient also identified 1 physical and 1 cognitive deficit independently. Patient consumed trials of ice chips with minimal overt s/s of aspiration, recommend MBS tomorrow to assess swallow function. Throughout session, patient appeared lethargic and mildly restless but was easily redirected. Patient  handed off to OT. Continue with current plan of care.      Pain No/Denies Pain   Agitated Behavior Scale: TBI Observation Details Observation Environment: CIR Start of observation period - Date: 05/11/22 Start of observation period - Time: 1030 End of observation period - Date: 05/11/22 End of observation period - Time: 1130 Agitated Behavior Scale (DO NOT LEAVE BLANKS) Short attention span, easy distractibility, inability to concentrate: Present to a moderate degree Impulsive, impatient, low tolerance for pain or frustration: Absent Uncooperative, resistant to care, demanding: Absent Violent and/or threatening violence toward people or property: Absent Explosive and/or unpredictable anger: Absent Rocking, rubbing, moaning, or other self-stimulating behavior: Absent Pulling at tubes, restraints, etc.: Absent Wandering from treatment areas: Absent Restlessness, pacing, excessive movement: Present to a slight degree Repetitive behaviors, motor, and/or verbal: Absent Rapid, loud, or excessive talking: Absent Sudden changes of mood: Absent Easily initiated or excessive crying and/or laughter: Absent Self-abusiveness, physical and/or verbal: Absent Agitated behavior scale total score: 17  Therapy/Group: Individual Therapy  Sharnetta Gielow 05/11/2022, 3:09 PM

## 2022-05-11 NOTE — Progress Notes (Signed)
Occupational Therapy TBI Note  Patient Details  Name: Zachary Hill MRN: [id number] Date of Birth: 02/22/1981  Today's Date: 05/11/2022 OT Individual Time: 1130-1205 OT Individual Time Calculation (min): 35 min    Short Term Goals: Week 1:  OT Short Term Goal 1 (Week 1): Pt will terminate oral care wiht no more than 2 cues OT Short Term Goal 2 (Week 1): Pt will appropriately use ADL items with no cuing to demo improved ideation OT Short Term Goal 3 (Week 1): Pt will complete functional trnasfers wiht CGA OT Short Term Goal 4 (Week 1): Pt will sequence bathing body parts with MOD cuing OT Short Term Goal 5 (Week 1): Pt will don shirt with supervsion  Skilled Therapeutic Interventions/Progress Updates:     Therapeutic activity Direct handoff from SLP. Pt politely declining all OOB tx d/t lethrgy/fatigue. 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 Pt eventually agreeable to completing leggo puzzle to look at spatial/visual perceptual skills. Pt able to complete level 1 puzzle but having trouble building because building from the top down and leggos not stable. When provided cue to try building bottom up, unalbe to change strategy. Pt demo significant difficulty with level 2 puzzle with shape and following color. With pointing out errors pt able to change shape accurately, but unable to reorder colors.  Pt left at end of session in bed with exit alarm on, wrist restraints and mitts reapplied, call light in reach and all needs met   Therapy Documentation Precautions:  Precautions Precautions: Fall Precaution Comments: PEG (with abdominal binder covering), unable to fully open R eye, fear/behavioral, impulsive, poor safety awareness Restrictions Weight Bearing Restrictions: No RUE Weight Bearing: Non weight bearing RLE Weight Bearing: Touchdown weight bearing LLE Weight Bearing: Touchdown weight bearing General:       Agitated Behavior Scale: TBI  Observation Details Observation Environment: CIR Start of observation period - Date: 05/11/22 Start of observation period - Time: 1130 End of observation period - Date: 05/11/22 End of observation period - Time: 1211 Agitated Behavior Scale (DO NOT LEAVE BLANKS) Short attention span, easy distractibility, inability to concentrate: Present to a moderate degree Impulsive, impatient, low tolerance for pain or frustration: Absent Uncooperative, resistant to care, demanding: Absent Violent and/or threatening violence toward people or property: Absent Explosive and/or unpredictable anger: Absent Rocking, rubbing, moaning, or other self-stimulating behavior: Present to a slight degree Pulling at tubes, restraints, etc.: Present to a slight degree Wandering from treatment areas: Absent Restlessness, pacing, excessive movement: Present to a slight degree Repetitive behaviors, motor, and/or verbal: Absent Rapid, loud, or excessive talking: Absent Sudden changes of mood: Absent Easily initiated or excessive crying and/or laughter: Absent Self-abusiveness, physical and/or verbal: Absent Agitated behavior scale total score: 19    Therapy/Group: Individual Therapy   M  05/11/2022, 12:19 PM 
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
## 2022-05-11 NOTE — Progress Notes (Signed)
Physical Therapy TBI Note  Patient Details  Name: Zachary Hill MRN: [id number] Date of Birth: [birth year]  Today's Date: 05/11/2022 PT Individual Time: 0900-1000 PT Individual Time Calculation (min): 60 min   Short Term Goals: Week 1:  PT Short Term Goal 1 (Week 1): Patient will perform stand pivot transfer with LRAD and minimal assistance PT Short Term Goal 2 (Week 1): Patient will ambulate x50' with LRAD and moderate assistance x1 PT Short Term Goal 3 (Week 1): Patient will ascend/descend x4 steps with L HR and minimal assistance  Skilled Therapeutic Interventions/Progress Updates:     Pt received supine in bed and agrees to therapy. No complaint of pain. Wrist restrains and mitten removed. Pt requires modA/maxA for supine to sit with cues for sequencing and positioning. Pt performs sit to stand with modA and manual facilitation of anterior weight shift and hip extension, with tactile cueing at trunk for upright posture and safety. RN present and changes pt's brief in standing. Pt transfers to [facility] with modA/maxA and cues for sequencing and positioning. WC transport to gym for time management. Pt performs multiple reps of sit to stand with modA HHA on L, and cued to attempt ambulation. Pt steps forward with R lower extremity but then shakes head and sits down. Pt repeats this process multiple times and appears anxious to attempt ambulation. RW provided and pt attempts ambulation and is able to step away from [facility]. Pt ambulates x75' slowly with minA/modA and cues for increasing proximity to RW for safety, maintaining upright gaze to improve posture and balance, and remaining on task due to pt becoming easily internally distracted. Pt sits on mat due to fatigue and attempts to lay down without cueing. PT cues pt for safety and provides modA for scooting to center of mat. Pt then performs sit to supine with minA and takes supine rest break. Supine to sit with modA. Stand step transfer from mat>WC>bed  with modA. Sit to supine with minA. Pt left with restraints in place.  Therapy Documentation Precautions:  Precautions Precautions: Fall Precaution Comments: PEG (with abdominal binder covering), unable to fully open R eye, fear/behavioral, impulsive, poor safety awareness Restrictions Weight Bearing Restrictions: No RUE Weight Bearing: Non weight bearing RLE Weight Bearing: Touchdown weight bearing LLE Weight Bearing: Touchdown weight bearing Agitated Behavior Scale: TBI Observation Details Observation Environment: CIR Start of observation period - Date: 05/11/22 Start of observation period - Time: 1030 End of observation period - Date: 05/11/22 End of observation period - Time: 1130 Agitated Behavior Scale (DO NOT LEAVE BLANKS) Short attention span, easy distractibility, inability to concentrate: Present to a moderate degree Impulsive, impatient, low tolerance for pain or frustration: Absent Uncooperative, resistant to care, demanding: Absent Violent and/or threatening violence toward people or property: Absent Explosive and/or unpredictable anger: Absent Rocking, rubbing, moaning, or other self-stimulating behavior: Absent Pulling at tubes, restraints, etc.: Absent Wandering from treatment areas: Absent Restlessness, pacing, excessive movement: Present to a slight degree Repetitive behaviors, motor, and/or verbal: Absent Rapid, loud, or excessive talking: Absent Sudden changes of mood: Absent Easily initiated or excessive crying and/or laughter: Absent Self-abusiveness, physical and/or verbal: Absent Agitated behavior scale total score: 17     Therapy/Group: Individual Therapy  Beau Fanny, PT, DPT 05/11/2022, 5:29 PM

## 2022-05-12 ENCOUNTER — Inpatient Hospital Stay (HOSPITAL_COMMUNITY): Payer: Medicare Other

## 2022-05-12 DIAGNOSIS — R1310 Dysphagia, unspecified: Secondary | ICD-10-CM

## 2022-05-12 DIAGNOSIS — R000 Tachycardia, unspecified: Secondary | ICD-10-CM

## 2022-05-12 DIAGNOSIS — E119 Type 2 diabetes mellitus without complications: Secondary | ICD-10-CM | POA: Diagnosis not present

## 2022-05-12 DIAGNOSIS — R5383 Other fatigue: Secondary | ICD-10-CM | POA: Diagnosis not present

## 2022-05-12 DIAGNOSIS — S069X9S Unspecified intracranial injury with loss of consciousness of unspecified duration, sequela: Secondary | ICD-10-CM | POA: Diagnosis not present

## 2022-05-12 DIAGNOSIS — E440 Moderate protein-calorie malnutrition: Secondary | ICD-10-CM

## 2022-05-12 LAB — GLUCOSE, CAPILLARY
Glucose-Capillary: 195 mg/dL — ABNORMAL HIGH (ref 70–99)
Glucose-Capillary: 155 mg/dL — ABNORMAL HIGH (ref 70–99)
Glucose-Capillary: 117 mg/dL — ABNORMAL HIGH (ref 70–99)
Glucose-Capillary: 205 mg/dL — ABNORMAL HIGH (ref 70–99)

## 2022-05-12 MED ORDER — FREE WATER
100.0000 mL | Freq: Three times a day (TID) | Status: DC
Start: 2022-05-12 — End: 2022-05-13
  Administered 2022-05-12 – 2022-05-13 (×2): 100 mL

## 2022-05-12 MED ORDER — JEVITY 1.5 CAL/FIBER PO LIQD
474.0000 mL | Freq: Three times a day (TID) | ORAL | Status: DC
Start: 2022-05-12 — End: 2022-05-18
  Administered 2022-05-12 – 2022-05-17 (×14): 474 mL
  Filled 2022-05-12: qty 1000
  Filled 2022-05-12 (×2): qty 474
  Filled 2022-05-12 (×2): qty 1000
  Filled 2022-05-12: qty 474
  Filled 2022-05-12 (×2): qty 1000
  Filled 2022-05-12: qty 474
  Filled 2022-05-12: qty 1000
  Filled 2022-05-12: qty 474
  Filled 2022-05-12 (×4): qty 1000
  Filled 2022-05-12: qty 474
  Filled 2022-05-12 (×4): qty 1000
  Filled 2022-05-12: qty 474

## 2022-05-12 MED ORDER — FREE WATER
140.0000 mL | Status: DC
  Administered 2022-05-12 – 2022-05-15 (×16): 140 mL

## 2022-05-12 MED ORDER — PROSOURCE TF20 ENFIT COMPATIBL EN LIQD
60.0000 mL | Freq: Two times a day (BID) | ENTERAL | Status: DC
  Administered 2022-05-12 – 2022-05-23 (×22): 60 mL
  Filled 2022-05-12 (×22): qty 60
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
## 2022-05-12 NOTE — Progress Notes (Signed)
Nutrition Brief Note   Reached out to MD to transition to bolus tube feeds. MD agreeable.  Pt scheduled to have MBS today.   Tube Feeds regimen as below: 2 cartons Jevity 1.5 - TID 60 mL ProSource TF 20 - BID 50 mL free water flush before and after each bolus + 140 mL free water flush q4h Provides 2293 kcal, 130 gm of protein, and 2220 mL total free water daily.   RD to continue to follow.   Kirby Crigler RD, LDN Clinical Dietitian See Loretha Stapler for contact information.

## 2022-05-12 NOTE — Plan of Care (Signed)
Behavioral Plan   [place] Level: V  Behavior to decrease/ eliminate:   -lethargy -decreased participation in treatment sessions  -pulling/removing lines   Changes to environment:  -Lights on, blinds open during the day; off and closed at night -TV off -encourage OOB activity as much as possible -encourage staying in recliner/chair (patient would need to be at RN station)   Interventions:  -Telesitter -Bilateral wrist restraints/mitts  -Bed alarm -Safety belt in chair -Sleep/wake chart  Recommendations for interactions with patient:  -Limit number of visitors at one time -Don't ask patient to do something, try to encourage instead (ex. We need to change your brief instead of can we change your brief) -Offer music during therapy to improve participation   Attendees:    Feliberto Gottron, SLP [place] , PT [place]anch [place], [place] Vedia Pereyra, RN

## 2022-05-12 NOTE — Progress Notes (Signed)
Occupational Therapy TBI Note  Patient Details  Name: Zachary Hill MRN: [id number] Date of Birth: 1981-09-02  Today's Date: 05/12/2022 OT Individual Time: 7846-9629 OT Individual Time Calculation (min): 95 min    Short Term Goals: Week 1:  OT Short Term Goal 1 (Week 1): Pt will terminate oral care wiht no more than 2 cues OT Short Term Goal 2 (Week 1): Pt will appropriately use ADL items with no cuing to demo improved ideation OT Short Term Goal 3 (Week 1): Pt will complete functional trnasfers wiht CGA OT Short Term Goal 4 (Week 1): Pt will sequence bathing body parts with MOD cuing OT Short Term Goal 5 (Week 1): Pt will don shirt with supervsion  Skilled Therapeutic Interventions/Progress Updates:     Pt received in bed with mild head ache. Pt offered to request medication from RN but declined. Pt requires profound amount of time to initiate any mobility, therapeutic rest to tolerate therapist presencew/cuing, and conversation. Pt increasingly confused today confabulating about going to breakfast with boyfriend prior to session, asking this clincian, "have you and your boyfriend had sex at my [place] yet." Provided gentle reorientation to situation but no evidence of carryover. P  Eventually pt able ot sup<>sit<>TIS 20x20 ROHO. Pt does not like cushion and impulsively gets bak into bed and remains there despite trying to convince pt to clean up. Pt very resistant because "I dont want to right now." Redirected to lay down and OT gets a 16x16 w/c with regular cushion. Pt able to be redirected to walking with walker with MIN-MOD A for ~25 feet then 10 feet before fatiguing. Pt taken to window in TIS then eventually redirected by LPN to change out of soiled clothing into clean gown with MOD A to stand while LPN cleanses pt.   Patient required increased time for initiation, cuing, rest breaks, and for completion of tasks throughout session. Utilized therapeutic use of self throughout to  promote efficiency.   Pt left at end of session in TIS with restraints in place, exit alarm on, call light in reach and all needs met   Therapy Documentation Precautions:  Precautions Precautions: Fall Precaution Comments: PEG (with abdominal binder covering), unable to fully open R eye, fear/behavioral, impulsive, poor safety awareness Restrictions Weight Bearing Restrictions: Yes RUE Weight Bearing: Non weight bearing RLE Weight Bearing: Touchdown weight bearing LLE Weight Bearing: Touchdown weight bearing    Agitated Behavior Scale: TBI  Observation Details Observation Environment: cir Start of observation period - Date: 05/12/22 Start of observation period - Time: 1030 End of observation period - Date: 05/12/22 End of observation period - Time: 1231 Agitated Behavior Scale (DO NOT LEAVE BLANKS) Short attention span, easy distractibility, inability to concentrate: Present to a moderate degree Impulsive, impatient, low tolerance for pain or frustration: Present to a moderate degree Uncooperative, resistant to care, demanding: Present to a moderate degree Violent and/or threatening violence toward people or property: Absent Explosive and/or unpredictable anger: Absent Rocking, rubbing, moaning, or other self-stimulating behavior: Absent Pulling at tubes, restraints, etc.: Present to a slight degree Wandering from treatment areas: Absent Restlessness, pacing, excessive movement: Present to a moderate degree Repetitive behaviors, motor, and/or verbal: Present to a slight degree Rapid, loud, or excessive talking: Absent Sudden changes of mood: Absent Easily initiated or excessive crying and/or laughter: Absent Self-abusiveness, physical and/or verbal: Absent Agitated behavior scale total score: 24  Therapy/Group: Individual Therapy  Tonny Branch 05/12/2022, 12:31 PM

## 2022-05-12 NOTE — Progress Notes (Signed)
Physical Therapy TBI Note  Patient Details  Name: Zachary Hill MRN: [id number] Date of Birth: 1981/05/06  Today's Date: 05/12/2022 PT Individual Time: 1331-1414 PT Individual Time Calculation (min): 43 min   Short Term Goals: Week 1:  PT Short Term Goal 1 (Week 1): Patient will perform stand pivot transfer with LRAD and minimal assistance PT Short Term Goal 2 (Week 1): Patient will ambulate x50' with LRAD and moderate assistance x1 PT Short Term Goal 3 (Week 1): Patient will ascend/descend x4 steps with L HR and minimal assistance  Skilled Therapeutic Interventions/Progress Updates:     Pt received supine in bed. Initially pt says that he will "get up later" but PT is able to redirect pt and eventually he is agreeable to mobilizing with PT. Pt performs supine to sit with modA and cues for body mechanics and sequencing. X2 reps of sit to stand required for pt to perform transfers to tilt in space WC with modA and cues for positoining and safety, with pt attempting to sit several times before being safely in front of WC. WC transport to gym. Pt performs stand step transfer to mat with modA and manual facilitation of anterior weight shift and upright posture. Ptcued to sit in short sitting on mat to work on sitting balance and activity tolerance. PT engages pt in conversation for a minute or two and then pt begins to lay down. PT attempts to redirect pt but pt is insistent on laying down. PT cues for positioning for safety. Pt takes supine rest break. ModA for return to sitting. PT cues pt to transfer back to [facility] but pt insists on laying back down. After several minutes PT re-cues pt for supine to sit, completed with modA and cues for body mechanics and positioning. Stand step transfer form mat>WC>bed with modA. Sit to supine with minA. Pt left supine with wrist restraints and mittens in place.  Therapy Documentation Precautions:  Precautions Precautions: Fall Precaution Comments: PEG (with  abdominal binder covering), unable to fully open R eye, fear/behavioral, impulsive, poor safety awareness Restrictions Weight Bearing Restrictions: Yes RUE Weight Bearing: Non weight bearing RLE Weight Bearing: Touchdown weight bearing LLE Weight Bearing: Touchdown weight bearing  Agitated Behavior Scale: TBI Observation Details Observation Environment: cir Start of observation period - Date: 05/12/22 Start of observation period - Time: 1030 End of observation period - Date: 05/12/22 End of observation period - Time: 1231 Agitated Behavior Scale (DO NOT LEAVE BLANKS) Short attention span, easy distractibility, inability to concentrate: Present to a moderate degree Impulsive, impatient, low tolerance for pain or frustration: Present to a moderate degree Uncooperative, resistant to care, demanding: Present to a moderate degree Violent and/or threatening violence toward people or property: Absent Explosive and/or unpredictable anger: Absent Rocking, rubbing, moaning, or other self-stimulating behavior: Absent Pulling at tubes, restraints, etc.: Present to a slight degree Wandering from treatment areas: Absent Restlessness, pacing, excessive movement: Present to a moderate degree Repetitive behaviors, motor, and/or verbal: Present to a slight degree Rapid, loud, or excessive talking: Absent Sudden changes of mood: Absent Easily initiated or excessive crying and/or laughter: Absent Self-abusiveness, physical and/or verbal: Absent Agitated behavior scale total score: 24    Therapy/Group: Individual Therapy  Beau Fanny 05/12/2022, 1:53 PM

## 2022-05-12 NOTE — Progress Notes (Signed)
Occupational Therapy TBI Note  Patient Details  Name: Zachary Hill MRN: [id number] Date of Birth: 1981/01/22  Today's Date: 05/12/2022 OT Individual Time: 4098-1191 OT Individual Time Calculation (min): 55 min    Short Term Goals: Week 1:  OT Short Term Goal 1 (Week 1): Pt will terminate oral care wiht no more than 2 cues OT Short Term Goal 2 (Week 1): Pt will appropriately use ADL items with no cuing to demo improved ideation OT Short Term Goal 3 (Week 1): Pt will complete functional trnasfers wiht CGA OT Short Term Goal 4 (Week 1): Pt will sequence bathing body parts with MOD cuing OT Short Term Goal 5 (Week 1): Pt will don shirt with supervsion  Skilled Therapeutic Interventions/Progress Updates:  Skilled OT intervention completed with focus on initiation, activity tolerance. Pt received upright in bed, no c/o pain, nurse present administering meds. Pt was unable to accurately recall place, time and date, however oriented to self. Therapist provided re-orientation. Pt was easily distractible, I.e. stated "I was listening to the ticking of the clock" when pt did not initiate transitioning to EOB despite pt verbally stating he was ready to. Emphasis of session in regards to pt initiating movements and tasks vs therapist enforcing. Also was perseverative on his fiances presence in the room (though fiance was not in room and therapist oriented pt to therapist and pt in room only).  Bilateral wrist restraints removed, then pt was able to sit up from [facility] with use of bilateral hand rails with supervision, with mod A for donning shirt. Difficulty with sequencing and orienting shirt noted. Donned deodorant sitting upright in bed, with supervision, without cues for placement and no inappropriate use of item noted. Significant time needed to transition to EOB, with pt recovering himself when therapist asked for permission to assist him with covers and received verbal permission. Able to transition  with supervision, thread BLE with supervision then donn over hips at the CGA level in stance with CGA for powering up as well.   Encouraged pt to side step towards [facility] for bed positioning however some anxiety noted with pt preferring to sit back down and boost hips 5 separate times til at [facility]. Supervision for sit > supine. Bilateral wrist restraints donned, with pt left upright in bed, bed alarm on and all needs in reach at end of session, awaiting MBS with SLP.   Therapy Documentation Precautions:  Precautions Precautions: Fall Precaution Comments: PEG (with abdominal binder covering), unable to fully open R eye, fear/behavioral, impulsive, poor safety awareness Restrictions Weight Bearing Restrictions: No RUE Weight Bearing: Non weight bearing RLE Weight Bearing: Touchdown weight bearing LLE Weight Bearing: Touchdown weight bearing  Agitated Behavior Scale: TBI Observation Details Observation Environment: CIR Start of observation period - Date: 05/11/22 Start of observation period - Time: 0800 End of observation period - Date: 05/11/22 End of observation period - Time: 0855 Agitated Behavior Scale (DO NOT LEAVE BLANKS) Short attention span, easy distractibility, inability to concentrate: Present to a moderate degree Impulsive, impatient, low tolerance for pain or frustration: Absent Uncooperative, resistant to care, demanding: Present to a slight degree Violent and/or threatening violence toward people or property: Absent Explosive and/or unpredictable anger: Absent Rocking, rubbing, moaning, or other self-stimulating behavior: Absent Pulling at tubes, restraints, etc.: Absent Wandering from treatment areas: Absent Restlessness, pacing, excessive movement: Absent Repetitive behaviors, motor, and/or verbal: Absent Rapid, loud, or excessive talking: Absent Sudden changes of mood: Absent Easily initiated or excessive crying and/or laughter: Absent Self-abusiveness, physical  and/or verbal: Absent Agitated behavior scale total score: 17     Therapy/Group: Individual Therapy  Melvyn Novas, MS, OTR/L  05/12/2022, 11:59 AM
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
## 2022-05-12 NOTE — Progress Notes (Signed)
Modified Barium Swallow Progress Note  Patient Details  Name: Zachary Hill MRN: [id number] Date of Birth: 10/19/1980  Today's Date: 05/12/2022  Modified Barium Swallow completed.  Full report located under Chart Review in the Imaging Section.  Brief recommendations include the following:  Clinical Impression  During study, patient presented with increased lethargy and appeared more confused compared to previous day. Patient demonstrates a mild oropharyngeal dysphagia characterized by decreased bolus cohesion and premature spillage with all liquids. Patient with mild-moderate pyriform residue with all consistencies that required multiple swallows to clear. Suspect residue is from decreased pharyngeal peristalsis due to what appears to be hypermobility during laryngeal excursion.  Despite residue, patient able to protect his airway except for trace, shallow penetration of thin liquids via cup X 1. Due to patient's fluctuating level of arousal and severe cognitive deficits, recommend trials of Dys. 1 textures with thin liquids with SLP only with plans to upgrade at bedside when appropriate.  Patient educated on results but will need reinforcement.   Swallow Evaluation Recommendations       SLP Diet Recommendations: NPO;Alternative means - long-term (Trials of Dys. 1 textures with thin liquids via cup with SLP)       Medication Administration: Via alternative means               Oral Care Recommendations: Oral care QID        Zachary Hill 05/12/2022,3:44 PM

## 2022-05-13 LAB — GLUCOSE, CAPILLARY
Glucose-Capillary: 142 mg/dL — ABNORMAL HIGH (ref 70–99)
Glucose-Capillary: 154 mg/dL — ABNORMAL HIGH (ref 70–99)
Glucose-Capillary: 168 mg/dL — ABNORMAL HIGH (ref 70–99)
Glucose-Capillary: 181 mg/dL — ABNORMAL HIGH (ref 70–99)
Glucose-Capillary: 183 mg/dL — ABNORMAL HIGH (ref 70–99)
Glucose-Capillary: 199 mg/dL — ABNORMAL HIGH (ref 70–99)
Glucose-Capillary: 200 mg/dL — ABNORMAL HIGH (ref 70–99)

## 2022-05-13 MED ORDER — ALUM & MAG HYDROXIDE-SIMETH 200-200-20 MG/5ML PO SUSP: 30.0000 mL | ORAL | Status: DC | PRN

## 2022-05-13 MED ORDER — ORAL CARE MOUTH RINSE
15.0000 mL | OROMUCOSAL | Status: DC
  Administered 2022-05-14 – 2022-05-23 (×32): 15 mL via OROMUCOSAL

## 2022-05-13 MED ORDER — ACETAMINOPHEN 325 MG PO TABS
325.0000 mg | ORAL_TABLET | ORAL | Status: DC | PRN
  Administered 2022-05-13: 325 mg
  Administered 2022-05-14 – 2022-05-30 (×11): 650 mg
  Filled 2022-05-13 (×13): qty 2

## 2022-05-13 MED ORDER — TRAZODONE HCL 50 MG PO TABS
25.0000 mg | ORAL_TABLET | Freq: Every evening | ORAL | Status: DC | PRN
  Administered 2022-05-13 – 2022-05-22 (×8): 50 mg
  Filled 2022-05-13 (×8): qty 1

## 2022-05-13 MED ORDER — GUAIFENESIN-DM 100-10 MG/5ML PO SYRP: 5.0000 mL | ORAL_SOLUTION | Freq: Four times a day (QID) | ORAL | Status: DC | PRN

## 2022-05-13 MED ORDER — SERTRALINE HCL 20 MG/ML PO CONC
25.0000 mg | Freq: Every day | ORAL | Status: DC
  Administered 2022-05-14 – 2022-05-23 (×10): 25 mg
  Filled 2022-05-13 (×11): qty 1.25

## 2022-05-13 MED ORDER — ORAL CARE MOUTH RINSE: 15.0000 mL | OROMUCOSAL | Status: DC | PRN

## 2022-05-13 MED ORDER — METHOCARBAMOL 500 MG PO TABS
500.0000 mg | ORAL_TABLET | Freq: Four times a day (QID) | ORAL | Status: DC | PRN
  Administered 2022-05-13 – 2022-05-19 (×2): 500 mg
  Filled 2022-05-13 (×2): qty 1

## 2022-05-13 MED ORDER — FOLIC ACID 1 MG PO TABS
1.0000 mg | ORAL_TABLET | Freq: Every day | ORAL | Status: DC
  Administered 2022-05-14 – 2022-05-23 (×10): 1 mg
  Filled 2022-05-13 (×10): qty 1
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
## 2022-05-13 NOTE — Progress Notes (Signed)
Physical Therapy TBI Note  Patient Details  Name: Zachary Hill MRN: [id number] Date of Birth: Apr 04, 1981  Today's Date: 05/13/2022 PT Individual Time: 1307-1406 PT Individual Time Calculation (min): 59 min   Short Term Goals: Week 1:  PT Short Term Goal 1 (Week 1): Patient will perform stand pivot transfer with LRAD and minimal assistance PT Short Term Goal 2 (Week 1): Patient will ambulate x50' with LRAD and moderate assistance x1 PT Short Term Goal 3 (Week 1): Patient will ascend/descend x4 steps with L HR and minimal assistance  Skilled Therapeutic Interventions/Progress Updates:    Pt received supine in bed with nurse present for medication administration and pt agreeable to therapy session. Pt wearing B soft wrist restraints - doffed during session.   Supine>sitting, L EOB, HOB partially elevated and reaching out for R HHA with min assist for trunk upright.   Pt wearing abdominal binder over his PEG for safety.  Pt benefits from increased time and counting down from 3 to initiate tasks.  Sit>stands with light mod assist for lifting throughout session - progressed to min assist when using RW.  R stand pivot transfer EOB>TIS w/c HHA and light mod assist for balance.   Transported to/from gym in w/c for time management and energy conservation.  Sit>stand w/c>L HHA transitioned to B HHA once upright with mod assist.  Gait training 49ft using B HHA with +2 heavy min assist as pt demonstrates antalgic gait pattern with limited tolerance to R LE WBing during stance phase - very slow gait speed with decreased B LE step lengths.   While sitting EOB, therapist notified nurse for pain medication administration. Pt repeatedly attempting to lie down for a rest break while EOM but therapist provided repeated encouragement to remain upright and provided him a supported seated rest break - pt does well tolerating this with gentle encouragement.  Provided pt with RW to use for gait training  to allow ability to off-weight R LE more successfully and allow increased tolerance for gait training.  Gait training 79ft + 60ft x2 using RW with min assist of 1 and pt demonstrating improved upright posture and more fluid gait mechanics with increased gait speed and longer step lengths by end of 3rd trial. Pt reports increased comfort and tolerance to R LE WBing using RW.  Therapist utilizing timer during session to take 3 minute rest breaks because pt frequently needing time without stimulus.   Pt noted to be incontinent of bladder - pt aware of this but did not inform therapist. Transported back to room.  Sit>stand at sink with min assist. Standing at sink dependent assist LB clothing management for cleanliness - provided pt washcloth and cued him to perform peri-hygiene but instead he starts to wash his face requiring dependent peri-care.   L partial stand pivot w/c>EOB with heavy min assist. Sit>supine using bedrails with CGA. Pt left supine in bed with needs in reach, bed alarm on, and B soft wrist restrains donned.   Therapy Documentation Precautions:  Precautions Precautions: Fall, Other (comment) Precaution Comments: PEG (with abdominal binder covering), unable to fully open R eye, fear/behavioral, impulsive, poor safety awareness Restrictions Weight Bearing Restrictions: No   Pain: Reports 8/10 pain in R LE during 1st gait trail - notified nurse for medication administration - provided RW to off-weight R LE more during standing and gait training - provided distraction and emotional support for pain management.   Agitated Behavior Scale: TBI  Observation Details Observation Environment: CIR Start of observation  period - Date: 05/13/22 Start of observation period - Time: 1307 End of observation period - Date: 05/13/22 End of observation period - Time: 1406 Agitated Behavior Scale (DO NOT LEAVE BLANKS) Short attention span, easy distractibility, inability to concentrate:  Present to a slight degree Impulsive, impatient, low tolerance for pain or frustration: Present to a slight degree Uncooperative, resistant to care, demanding: Present to a slight degree Violent and/or threatening violence toward people or property: Absent Explosive and/or unpredictable anger: Absent Rocking, rubbing, moaning, or other self-stimulating behavior: Absent Pulling at tubes, restraints, etc.: Absent Wandering from treatment areas: Absent Restlessness, pacing, excessive movement: Present to a slight degree Repetitive behaviors, motor, and/or verbal: Absent Rapid, loud, or excessive talking: Absent Sudden changes of mood: Present to a slight degree Easily initiated or excessive crying and/or laughter: Absent Self-abusiveness, physical and/or verbal: Absent Agitated behavior scale total score: 19     Therapy/Group: Individual Therapy  Ginny Forth , PT, DPT, NCS, CSRS 05/13/2022, 12:19 PM

## 2022-05-13 NOTE — Progress Notes (Signed)
Occupational Therapy TBI Note  Patient Details  Name: Zachary Hill MRN: [id number] Date of Birth: [birth year]  Today's Date: 05/13/2022 OT Individual Time: 2952-8413 OT Individual Time Calculation (min): 65 min    Short Term Goals: Week 1:  OT Short Term Goal 1 (Week 1): Pt will terminate oral care wiht no more than 2 cues OT Short Term Goal 2 (Week 1): Pt will appropriately use ADL items with no cuing to demo improved ideation OT Short Term Goal 3 (Week 1): Pt will complete functional trnasfers wiht CGA OT Short Term Goal 4 (Week 1): Pt will sequence bathing body parts with MOD cuing OT Short Term Goal 5 (Week 1): Pt will don shirt with supervsion  Skilled Therapeutic Interventions/Progress Updates:    1:1. Pt received in bed oriented to self and TBI, but believes we are at church. Gently reoriented pt to situation. Pt initially missed 10 min d/t not wanting to get up but set a time and agreeable when OT returned with education on getting up/mobilizing to help with alertness/arousal and back pain from laying in bed. Pt received medication during session and MOD A to complete peri care/brief change. Pt requires MAX A to dress LB clothing and SPT with MOD A to w/c. Patient required increased time for initiation, cuing, rest breaks, and for completion of tasks throughout session. Utilized therapeutic use of self throughout to promote efficiency.  Pt completes seated BITS activity: with A-O scanning with MAX A to do first 3 letters d/t in periphery. Pt able to complete the rest with ~6-9 sec reaction time. Attempted bells cancellation test with pt fully ignroing R half of screen and after hitting ~5 bells begins to select any stimulus despite cuing. Exited session with pt seated in TIS at EN station with Wrist restraints applied, exit alarm on and call light in reach   Therapy Documentation Precautions:  Precautions Precautions: Fall Precaution Comments: PEG (with abdominal binder covering),  unable to fully open R eye, fear/behavioral, impulsive, poor safety awareness Restrictions Weight Bearing Restrictions: Yes RUE Weight Bearing: Non weight bearing RLE Weight Bearing: Touchdown weight bearing LLE Weight Bearing: Touchdown weight bearing General: General OT Amount of Missed Time: 10 Minutes Vital Signs:   Pain: Pain Assessment Pain Scale: 0-10 Pain Score: 0-No pain Agitated Behavior Scale: TBI  Observation Details Observation Environment: CIR Start of observation period - Date: 05/13/22 Start of observation period - Time: 0825 End of observation period - Date: 05/13/22 End of observation period - Time: 0930 Agitated Behavior Scale (DO NOT LEAVE BLANKS) Short attention span, easy distractibility, inability to concentrate: Present to a moderate degree Impulsive, impatient, low tolerance for pain or frustration: Present to a moderate degree Uncooperative, resistant to care, demanding: Present to a slight degree Violent and/or threatening violence toward people or property: Absent Explosive and/or unpredictable anger: Absent Rocking, rubbing, moaning, or other self-stimulating behavior: Absent Pulling at tubes, restraints, etc.: Present to a slight degree Wandering from treatment areas: Absent Restlessness, pacing, excessive movement: Present to a slight degree Repetitive behaviors, motor, and/or verbal: Present to a slight degree Rapid, loud, or excessive talking: Absent Sudden changes of mood: Absent Easily initiated or excessive crying and/or laughter: Absent Self-abusiveness, physical and/or verbal: Absent Agitated behavior scale total score: 22  Therapy/Group: Individual Therapy  Shon Hale 05/13/2022, 12:14 PM

## 2022-05-14 DIAGNOSIS — S069X0S Unspecified intracranial injury without loss of consciousness, sequela: Secondary | ICD-10-CM

## 2022-05-14 LAB — GLUCOSE, CAPILLARY
Glucose-Capillary: 131 mg/dL — ABNORMAL HIGH (ref 70–99)
Glucose-Capillary: 207 mg/dL — ABNORMAL HIGH (ref 70–99)
Glucose-Capillary: 146 mg/dL — ABNORMAL HIGH (ref 70–99)
Glucose-Capillary: 201 mg/dL — ABNORMAL HIGH (ref 70–99)
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
## 2022-05-14 NOTE — Progress Notes (Signed)
Speech Language Pathology Daily Session Note  Patient Details  Name: Zachary Hill MRN: [id number] Date of Birth: 1981/05/09  Today's Date: 05/14/2022 SLP Individual Time: 1045-1130 SLP Individual Time Calculation (min): 45 min  Short Term Goals: Week 1: SLP Short Term Goal 1 (Week 1): Patient will demonstrate sustained attention to functional tasks for 10 minutes with Max verbal cues for redirection. SLP Short Term Goal 2 (Week 1): Patient will demonstrate functional problem solving for basic and familiar tasks with Max A multimodal cues. SLP Short Term Goal 3 (Week 1): Patient will utilize external memory aids for orientation to place, time, and situation with Mod verbal and visual cues. SLP Short Term Goal 4 (Week 1): Patient will verbalize 1 cognitive and 1 physical deficit with Max A multimodal cues. SLP Short Term Goal 5 (Week 1): Patient will participate in repeat MBS to assess swallow function. SLP Short Term Goal 6 (Week 1): Patient will consume trials of ice chips with minimal overt s/s of aspiration with Mod verbal cues for use of swallowing compensatory strategies.  Skilled Therapeutic Interventions: skilled slp intervention focused on diet tolerance and cognition. He consumed 3 oz of dys 1 texture and 1 sip of thin liquid (water via straw). He tolerated with no overt s/sx of aspiration or penetration.He refused further trials of thin liquid stating it was disgusting. Pt oriented to date, place time and situation using calendar and clock on wall with min verbal cues. Pt often demonstrated confused speech with responses to questions and made unrelated statements to topics. He sustained attention for 15 min to name items in category for letters A-z. He participated well in skilled slp intervention. Cont with poc.      Pain Pain Assessment Pain Scale: Faces Pain Score: 0-No pain Faces Pain Scale: No hurt  Therapy/Group: Individual Therapy  Carlean Jews Tomeca Helm 05/14/2022, 11:27 AM

## 2022-05-15 DIAGNOSIS — R1312 Dysphagia, oropharyngeal phase: Secondary | ICD-10-CM | POA: Diagnosis not present

## 2022-05-15 DIAGNOSIS — Z21 Asymptomatic human immunodeficiency virus [HIV] infection status: Secondary | ICD-10-CM | POA: Diagnosis not present

## 2022-05-15 DIAGNOSIS — S069X9S Unspecified intracranial injury with loss of consciousness of unspecified duration, sequela: Secondary | ICD-10-CM | POA: Diagnosis not present

## 2022-05-15 DIAGNOSIS — E119 Type 2 diabetes mellitus without complications: Secondary | ICD-10-CM | POA: Diagnosis not present

## 2022-05-15 LAB — BASIC METABOLIC PANEL
Anion gap: 10 (ref 5–15)
BUN: 25 mg/dL — ABNORMAL HIGH (ref 6–20)
CO2: 25 mmol/L (ref 22–32)
Calcium: 10.8 mg/dL — ABNORMAL HIGH (ref 8.9–10.3)
Chloride: 99 mmol/L (ref 98–111)
Creatinine, Ser: 0.95 mg/dL (ref 0.61–1.24)
GFR, Estimated: >60 mL/min (ref >60)
Glucose, Bld: 143 mg/dL — ABNORMAL HIGH (ref 70–99)
Potassium: 4.3 mmol/L (ref 3.5–5.1)
Sodium: 134 mmol/L — ABNORMAL LOW (ref 135–145)

## 2022-05-15 LAB — CBC
HCT: 33 % — ABNORMAL LOW (ref 39.0–52.0)
Hemoglobin: 10.4 g/dL — ABNORMAL LOW (ref 13.0–17.0)
MCH: 26.4 pg (ref 26.0–34.0)
MCHC: 31.5 g/dL (ref 30.0–36.0)
MCV: 83.8 fL (ref 80.0–100.0)
Platelets: 306 10*3/uL (ref 150–400)
RBC: 3.94 MIL/uL — ABNORMAL LOW (ref 4.22–5.81)
RDW: 16.6 % — ABNORMAL HIGH (ref 11.5–15.5)
WBC: 6 10*3/uL (ref 4.0–10.5)
nRBC: 0 % (ref 0.0–0.2)

## 2022-05-15 LAB — GLUCOSE, CAPILLARY
Glucose-Capillary: 129 mg/dL — ABNORMAL HIGH (ref 70–99)
Glucose-Capillary: 110 mg/dL — ABNORMAL HIGH (ref 70–99)
Glucose-Capillary: 131 mg/dL — ABNORMAL HIGH (ref 70–99)
Glucose-Capillary: 162 mg/dL — ABNORMAL HIGH (ref 70–99)

## 2022-05-15 MED ORDER — FREE WATER
200.0000 mL | Status: DC
Start: 2022-05-15 — End: 2022-05-19
  Administered 2022-05-15 – 2022-05-19 (×24): 200 mL

## 2022-05-15 MED ORDER — NICOTINE 7 MG/24HR TD PT24
7.0000 mg | MEDICATED_PATCH | Freq: Every day | TRANSDERMAL | Status: DC
  Administered 2022-05-15 – 2022-05-31 (×17): 7 mg via TRANSDERMAL
  Filled 2022-05-15 (×17): qty 1

## 2022-05-15 MED ORDER — NICOTINE 21 MG/24HR TD PT24
21.0000 mg | MEDICATED_PATCH | Freq: Every day | TRANSDERMAL | Status: DC
Start: 2022-05-15 — End: 2022-05-15

## 2022-05-15 MED ORDER — QUETIAPINE FUMARATE 25 MG PO TABS: 25.0000 mg | ORAL_TABLET | Freq: Three times a day (TID) | ORAL | Status: DC | PRN

## 2022-05-15 NOTE — Progress Notes (Signed)
Patient attempted to get out of bed multiple times this shift with one successful attempt. Verbalized that he was trying to find his shoes and jacket because he was going outside to smoke a cigarette. Informed patient that we could not go outside to smoke at this time, able to redirect following multiple attempts. Notified PA. Please see new orders. Patient resting in bed at this time with bed alarms in place and call bell within reach.

## 2022-05-15 NOTE — Progress Notes (Signed)
Physical Therapy TBI Note  Patient Details  Name: Zachary Hill MRN: [id number] Date of Birth: December 27, 1980  Today's Date: 05/15/2022 PT Individual Time: 1045-1150 PT Individual Time Calculation (min): 65 min   Short Term Goals: Week 1:  PT Short Term Goal 1 (Week 1): Patient will perform stand pivot transfer with LRAD and minimal assistance PT Short Term Goal 2 (Week 1): Patient will ambulate x50' with LRAD and moderate assistance x1 PT Short Term Goal 3 (Week 1): Patient will ascend/descend x4 steps with L HR and minimal assistance  Skilled Therapeutic Interventions/Progress Updates:     Patient in bed listening to music upon PT arrival. Patient alert and agreeable to PT session. Patient reported un-rated lower abdominal and back pain with trunk extension in standing and lying activities during session, RN made aware. PT provided repositioning, rest breaks, and distraction as pain interventions throughout session. Notable keloid scaring and palpable scar tissue over abdominal incision site. Incision closed and without erythema.   Orientation: Self: correct name and DOB Situation: unable to recall Time: correct year, month "November," unable to answer date and day of the week Location: "[place]"  Provided orientation to time and situation, reviewed education about his injuries and general TBI symptoms and strategies for management.   Patient with improved activity and stimulation tolerance today, continues to be limited by fatigue. Tolerated >45 min out of the room in the Day Room with moderate environmental stimulation. Played classical music throughout session for improved patient participation. Patient calm and quiet while outside of the room, upon return to the room, patient became more verbally engaged with intermittent language of confusion x2, otherwise appropriate throughout. Patient required increased time for initiation, cuing, rest breaks, and for completion of  tasks throughout session. Utilized therapeutic use of self throughout to promote efficiency.   Therapeutic Activity: Bed Mobility: Patient performed supine to/from sit with supervision in the hospital bed and on a mat table. Provided verbal cues for initiation and sequencing. Transfers: Patient performed stand pivot bed>TIS w/c with CGA-close supervision using arm rests for stabilizing support. He performed sit to/from stand x4 with CGA using RW. Provided verbal cues for safe use of AD, hand placement, and completing turns before sitting.  Gait Training:  Patient ambulated 35 feet x2, 10 feet, and 86 feet using RW with CGA. Ambulated with decreased gait speed, decreased step length and height, forefoot placement at initial contact, significant forward trunk lean, and downward head gaze. Provided verbal cues for erect posture and safe proximity to RW. Patient reported increased abdominal and low back pain with trunk extension limiting tolerance.   Therapeutic Exercise: Patient performed the following exercises with verbal and tactile cues for proper technique. -progressive anterior trunk stretching, hook-lying with blue wedge>single leg straight with blue wedge>B legs straight with blue wedge 2x2 min>hook-lying without wedge>single leg straight without wedge -bridging 2x5 -B SLR AAROM 2x5 -B heel slides 2x5  Patient in bed at end of session with breaks locked, bed alarm set, Telesitter in place, and all needs within reach. Patient requested to rest in the room with 10 min remaining in session. Reported increased fatigue and declined further therapeutic interventions appropriately. Patient missed 10 min of skilled PT due to fatigue, RN made aware. Will attempt to make-up missed time as able.     Therapy Documentation Precautions:  Precautions Precautions: Fall, Other (comment) Precaution Comments: PEG (with abdominal binder covering), unable to fully open R eye, fear/behavioral, impulsive, poor  safety awareness Restrictions Weight Bearing  Restrictions: No RUE Weight Bearing: Non weight bearing RLE Weight Bearing: Touchdown weight bearing LLE Weight Bearing: Touchdown weight bearing General: PT Amount of Missed Time (min): 10 Minutes PT Missed Treatment Reason: Patient fatigue  Agitated Behavior Scale: TBI Observation Details Observation Environment: [address]/day room Start of observation period - Date: 05/15/22 Start of observation period - Time: 1045 End of observation period - Date: 05/15/22 End of observation period - Time: 1150 Agitated Behavior Scale (DO NOT LEAVE BLANKS) Short attention span, easy distractibility, inability to concentrate: Present to a slight degree Impulsive, impatient, low tolerance for pain or frustration: Absent Uncooperative, resistant to care, demanding: Present to a slight degree Violent and/or threatening violence toward people or property: Absent Explosive and/or unpredictable anger: Absent Rocking, rubbing, moaning, or other self-stimulating behavior: Absent Pulling at tubes, restraints, etc.: Absent Wandering from treatment areas: Absent Restlessness, pacing, excessive movement: Absent Repetitive behaviors, motor, and/or verbal: Absent Rapid, loud, or excessive talking: Absent Sudden changes of mood: Absent Easily initiated or excessive crying and/or laughter: Absent Self-abusiveness, physical and/or verbal: Absent Agitated behavior scale total score: 16    Therapy/Group: Individual Therapy  Karaline Buresh L Ashtynn Berke PT, DPT, NCS, CBIS  05/15/2022, 12:25 PM

## 2022-05-15 NOTE — Progress Notes (Signed)
Speech Language Pathology TBI Note  Patient Details  Name: Zachary Hill MRN: [id number] Date of Birth: 06/09/1981  Today's Date: 05/15/2022 SLP Individual Time: 1256-1355 SLP Individual Time Calculation (min): 59 min  Short Term Goals: Week 1: SLP Short Term Goal 1 (Week 1): Patient will demonstrate sustained attention to functional tasks for 10 minutes with Max verbal cues for redirection. SLP Short Term Goal 2 (Week 1): Patient will demonstrate functional problem solving for basic and familiar tasks with Max A multimodal cues. SLP Short Term Goal 3 (Week 1): Patient will utilize external memory aids for orientation to place, time, and situation with Mod verbal and visual cues. SLP Short Term Goal 4 (Week 1): Patient will verbalize 1 cognitive and 1 physical deficit with Max A multimodal cues. SLP Short Term Goal 5 (Week 1): Patient will participate in repeat MBS to assess swallow function. SLP Short Term Goal 6 (Week 1): Patient will consume trials of ice chips with minimal overt s/s of aspiration with Mod verbal cues for use of swallowing compensatory strategies.  Skilled Therapeutic Interventions: Skilled treatment session focused on dysphagia and cognitive goals. Upon arrival, patient was awake while supine in bed and appeared lethargic. Patient agreeable to treatment session but required extra time and encouragement for initiation with all tasks. Patient sat EOB and consumed a snack of Dys. 1 textures with thin liquids via cup. Patient with mild impulsivity with self-feeding requiring Mod verbal cues for use of multiple swallows and small sips resulting in throat clearing X 1. Patient also with frequent belching and reports of a stomach ache after PO intake (1/2 pudding and ~3 oz of water). SLP provided external aids for recall of timeline of current hospitalization and for recall and carryover of daily events. Patient required total A for recall while recording information in the notebook  with Max verbal cues needed for utilization throughout session. Patient with language of confusion and confabulations throughout session that were easily redirected. Patient left upright in bed with alarm on and all needs within reach. Continue with current plan of care.      Pain Pain Assessment Pain Score: 0-No pain  Agitated Behavior Scale: TBI Observation Details Observation Environment: Patient's room Start of observation period - Date: 05/15/22 Start of observation period - Time: 1255 End of observation period - Date: 05/15/22 End of observation period - Time: 1355 Agitated Behavior Scale (DO NOT LEAVE BLANKS) Short attention span, easy distractibility, inability to concentrate: Present to a slight degree Impulsive, impatient, low tolerance for pain or frustration: Absent Uncooperative, resistant to care, demanding: Present to a slight degree Violent and/or threatening violence toward people or property: Absent Explosive and/or unpredictable anger: Absent Rocking, rubbing, moaning, or other self-stimulating behavior: Absent Pulling at tubes, restraints, etc.: Absent Wandering from treatment areas: Absent Restlessness, pacing, excessive movement: Absent Repetitive behaviors, motor, and/or verbal: Absent Rapid, loud, or excessive talking: Absent Sudden changes of mood: Absent Easily initiated or excessive crying and/or laughter: Absent Self-abusiveness, physical and/or verbal: Absent Agitated behavior scale total score: 16  Therapy/Group: Individual Therapy  Tisa Weisel 05/15/2022, 2:09 PM

## 2022-05-15 NOTE — Progress Notes (Signed)
PROGRESS NOTE   Subjective/Complaints:   Pt awake in bed. Nurse in room. He's listening to piano music. Appears relaxed. Has no complaints.  ROS: Limited due to cognitive/behavioral    Objective:   No results found. Recent Labs    05/15/22 0541  WBC 6.0  HGB 10.4*  HCT 33.0*  PLT 306   Recent Labs    05/15/22 0541  NA 134*  K 4.3  CL 99  CO2 25  GLUCOSE 143*  BUN 25*  CREATININE 0.95  CALCIUM 10.8*   No intake or output data in the 24 hours ending 05/15/22 1007      Physical Exam: Vital Signs Blood pressure 116/73, pulse (!) 102, temperature 98.1 F (36.7 C), temperature source Oral, resp. rate 18, height 5\' 11"  (1.803 m), weight 67.2 kg, SpO2 100 %.  Constitutional: No distress . Vital signs reviewed. HEENT:  oral membranes moist Neck: supple Cardiovascular: RRR without murmur. No JVD    Respiratory/Chest: CTA Bilaterally without wheezes or rales. Normal effort    GI/Abdomen: BS +, non-tender, non-distended Ext: no clubbing, cyanosis, or edema Psych: flat but pleasant and cooperative Skin: No evidence of breakdown, no evidence of rash Musculoskeletal:     Cervical back: Neck supple.     Right lower leg: No edema.     Left lower leg: No edema.  Skin:    General: Skin is warm and dry.     G tube site ok, right face/eye scarring present  Neurological:     Comments: oriented to hospital and TBI and self  Could not provide date Continues to have increased impulsivity. Followed all basic commands but inconsistent . Moved all 4 limbs with no visible limitations.      Assessment/Plan: 1. Functional deficits which require 3+ hours per day of interdisciplinary therapy in a comprehensive inpatient rehab setting. Physiatrist is providing close team supervision and 24 hour management of active medical problems listed below. Physiatrist and rehab team continue to assess barriers to discharge/monitor patient  progress toward functional and medical goals  Care Tool:  Bathing  Bathing activity did not occur: Refused           Bathing assist       Upper Body Dressing/Undressing Upper body dressing   What is the patient wearing?: Pull over shirt    Upper body assist Assist Level: Supervision/Verbal cueing    Lower Body Dressing/Undressing Lower body dressing      What is the patient wearing?: Pants     Lower body assist Assist for lower body dressing: Contact Guard/Touching assist     Toileting Toileting    Toileting assist Assist for toileting: Total Assistance - Patient < 25%     Transfers Chair/bed transfer  Transfers assist  Chair/bed transfer activity did not occur: Safety/medical concerns  Chair/bed transfer assist level: Moderate Assistance - Patient 50 - 74%     Locomotion Ambulation   Ambulation assist      Assist level: 2 helpers Assistive device: Hand held assist Max distance: 15   Walk 10 feet activity   Assist     Assist level: 2 helpers Assistive device: Hand held assist   Walk  50 feet activity   Assist Walk 50 feet with 2 turns activity did not occur: Safety/medical concerns (Behavior)         Walk 150 feet activity   Assist Walk 150 feet activity did not occur: Safety/medical concerns (Behavior)         Walk 10 feet on uneven surface  activity   Assist     Assist level: 2 helpers Assistive device: Hand held assist   Wheelchair     Assist Is the patient using a wheelchair?: No             Wheelchair 50 feet with 2 turns activity    Assist            Wheelchair 150 feet activity     Assist          Blood pressure 116/73, pulse (!) 102, temperature 98.1 F (36.7 C), temperature source Oral, resp. rate 18, height 5\' 11"  (1.803 m), weight 67.2 kg, SpO2 100 %.  Medical Problem List and Plan: 1. Functional deficits secondary to TBI, polytrauma, intra-abdominal injuries suffered on  03/08/22             -patient may shower             -ELOS/Goals: 18-24 days, supervision to min assist             -RLAS V/VI  -Continue CIR therapies including PT, OT, and SLP  2.  Antithrombotics: -DVT/anticoagulation:   Mechanical:  Antiembolism stockings, knee (TED hose)  Bilateral lower extremities -dopplers-Neg 8/9             -antiplatelet therapy: none 3. Pain Management: Tylenol as needed 4. Mood/Behavior/Sleep: agitation has improved             -continue Zyprexa 10 mg daily             -Ativan 0.5 mg BID (changed to PRN)             -Haldol 5 mg q 6 hours prn -Neurontin 100 mg BID -Zoloft 25 mg daily             -antipsychotic agents: see above             -continue sleep chart 5. Neuropsych/cognition: This patient is not capable of making decisions on his own behalf.             -continue ritalin for arousal and initiation 6. Skin/Wound Care: routine skin care checks             -routine PEG site care (placed 7/24) 7. Fluids/Electrolytes/Nutrition: Routine Is and Os and follow-up chemistries             -dysphagia 1 diet with thin liquids             -tube feeds>>increased to 60 cc/hr; continue ProSource             -consulted dietician  -8/4 further elevation of BUN--increase h2o flushes further 8: Syphilis/neurosyphilis: treated with Rocephin and doxycycline             -  pen G IV q 4 hours completed 8/11 9: HIV positivity: continue Truvada (substituted Descovy) and Tivicay 10: DM-2: continue CBGs, SSI             -Lantus 18 units BID  CBG (last 3)  Recent Labs    05/14/22 1640 05/14/22 2101 05/15/22 0607  GLUCAP 146* 201* 129*    -Increased to 20 units  BID improving 8/13   -follow for pattern 11: Paroxysmal tachycardia/hypertension: continue diltiazem, metoprolol             -heart rate currently controlled             -mag oxide 400 mg daily   Vitals:   05/14/22 1937 05/15/22 0200  BP: 113/80 116/73  Pulse: (!) 101 (!) 102  Resp: 18 18  Temp: 98.4 F  (36.9 C) 98.1 F (36.7 C)  SpO2: 100% 100%    12: Urinary retention:  voiding trial, timed voids  -currently incontinent 13: 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 Dysphagia -NPO ,   tube feeds to bolus  LOS: 6 days A FACE TO FACE EVALUATION WAS PERFORMED  Ranelle Oyster 05/15/2022, 10:07 AM

## 2022-05-15 NOTE — Progress Notes (Signed)
Patient reported to be restless and irritated--asking for  cigarettes. Will add nicotine patch 7 mg daily.

## 2022-05-15 NOTE — Progress Notes (Signed)
Occupational Therapy Session Note  Patient Details  Name: Zachary Hill MRN: [id number] Date of Birth: Apr 20, 1981  Today's Date: 05/15/2022 OT Individual Time: 4196-2229 OT Individual Time Calculation (min): 60 min    Short Term Goals: Week 1:  OT Short Term Goal 1 (Week 1): Pt will terminate oral care wiht no more than 2 cues OT Short Term Goal 2 (Week 1): Pt will appropriately use ADL items with no cuing to demo improved ideation OT Short Term Goal 3 (Week 1): Pt will complete functional trnasfers wiht CGA OT Short Term Goal 4 (Week 1): Pt will sequence bathing body parts with MOD cuing OT Short Term Goal 5 (Week 1): Pt will don shirt with supervsion  Skilled Therapeutic Interventions/Progress Updates:   Upon OT arrival, pt semi recumbent in bed reporting no pain. Pt agreeable to OT treatment session with encouragement. Pt pleasant throughout session and no s/s of distress. Treatment intervention with a focus on self care retraining and cognition. Pt requires increased time to engage in treatment session. Pt able to identify why he is here in the hospital but does not know the day of the week or month. Pt completes supine to sit transfer with Min A and sits EOB with Supervision. Pt able to choose his own clothes to wear with options provided. Pt requires verbal cues for initiation throughout session. Pt dons deodorant with supervision, complete oral care with Min A, don shirt with Supervision, pants with Min A for balance, TED hose with Total A and shoes with Supervision. Pt able to scan environment to locate deodorant during session. Pt reports headache and RN notified. Pt able to engage in conversation with this therapist regarding what types of things he enjoys doing and shows he enjoys watching. Pt completes sit to supine transfer with Supervision and was left in bed at end of session with all needs met and safety measures in place. Pt limited by decreased activity tolerance and continues  to benefit from OT services to maximize independence and safety during self care.   Therapy Documentation Precautions:  Precautions Precautions: Fall, Other (comment) Precaution Comments: PEG (with abdominal binder covering), unable to fully open R eye, fear/behavioral, impulsive, poor safety awareness Restrictions Weight Bearing Restrictions: No RUE Weight Bearing: Non weight bearing RLE Weight Bearing: Touchdown weight bearing LLE Weight Bearing: Touchdown weight bearing    Therapy/Group: Individual Therapy  Marvetta Gibbons 05/15/2022, 9:46 AM

## 2022-05-16 DIAGNOSIS — Z21 Asymptomatic human immunodeficiency virus [HIV] infection status: Secondary | ICD-10-CM | POA: Diagnosis not present

## 2022-05-16 DIAGNOSIS — E119 Type 2 diabetes mellitus without complications: Secondary | ICD-10-CM | POA: Diagnosis not present

## 2022-05-16 DIAGNOSIS — R1312 Dysphagia, oropharyngeal phase: Secondary | ICD-10-CM | POA: Diagnosis not present

## 2022-05-16 DIAGNOSIS — S069X9S Unspecified intracranial injury with loss of consciousness of unspecified duration, sequela: Secondary | ICD-10-CM | POA: Diagnosis not present

## 2022-05-16 LAB — GLUCOSE, CAPILLARY
Glucose-Capillary: 134 mg/dL — ABNORMAL HIGH (ref 70–99)
Glucose-Capillary: 209 mg/dL — ABNORMAL HIGH (ref 70–99)
Glucose-Capillary: 175 mg/dL — ABNORMAL HIGH (ref 70–99)
Glucose-Capillary: 191 mg/dL — ABNORMAL HIGH (ref 70–99)

## 2022-05-16 MED ORDER — METHYLPHENIDATE HCL 5 MG PO TABS
10.0000 mg | ORAL_TABLET | Freq: Two times a day (BID) | ORAL | Status: DC
  Administered 2022-05-16 – 2022-05-23 (×14): 10 mg
  Filled 2022-05-16 (×14): qty 2

## 2022-05-16 NOTE — Progress Notes (Signed)
PROGRESS NOTE   Subjective/Complaints:  Pt resting comfortably in bed. Attempted to get OOB yesterday evening. Nicotine patch added due to cigarette craving.   ROS: Limited due to cognitive/behavioral    Objective:   No results found. Recent Labs    05/15/22 0541  WBC 6.0  HGB 10.4*  HCT 33.0*  PLT 306   Recent Labs    05/15/22 0541  NA 134*  K 4.3  CL 99  CO2 25  GLUCOSE 143*  BUN 25*  CREATININE 0.95  CALCIUM 10.8*   No intake or output data in the 24 hours ending 05/16/22 1132      Physical Exam: Vital Signs Blood pressure 123/85, pulse 98, temperature 98 F (36.7 C), resp. rate 16, height 5\' 11"  (1.803 m), weight 67.2 kg, SpO2 100 %.  Constitutional: No distress . Vital signs reviewed. HEENT: NCAT, EOMI, oral membranes moist. Opening eye a bit more today Neck: supple Cardiovascular: RRR without murmur. No JVD    Respiratory/Chest: CTA Bilaterally without wheezes or rales. Normal effort    GI/Abdomen: BS +, non-tender, non-distended. PEG intact Ext: no clubbing, cyanosis, or edema Psych: pleasant and cooperative  Skin: No evidence of breakdown, no evidence of rash. Scarring about abdomen.  Musculoskeletal:     Cervical back: Neck supple.     Right lower leg: No edema.     Left lower leg: No edema.  Skin:    General: Skin is warm and dry.     G tube site ok, right face/eye scarring present  Neurological:     Comments: oriented to hospital and TBI and self. Fair insight but still lacking awareness of medical condition, functional abilities.  Followed all basic commands but inconsistent . Moved all 4 limbs with no visible limitations.      Assessment/Plan: 1. Functional deficits which require 3+ hours per day of interdisciplinary therapy in a comprehensive inpatient rehab setting. Physiatrist is providing close team supervision and 24 hour management of active medical problems listed  below. Physiatrist and rehab team continue to assess barriers to discharge/monitor patient progress toward functional and medical goals  Care Tool:  Bathing  Bathing activity did not occur: Refused           Bathing assist       Upper Body Dressing/Undressing Upper body dressing   What is the patient wearing?: Pull over shirt    Upper body assist Assist Level: Supervision/Verbal cueing    Lower Body Dressing/Undressing Lower body dressing      What is the patient wearing?: Pants     Lower body assist Assist for lower body dressing: Contact Guard/Touching assist     Toileting Toileting    Toileting assist Assist for toileting: Total Assistance - Patient < 25%     Transfers Chair/bed transfer  Transfers assist  Chair/bed transfer activity did not occur: Safety/medical concerns  Chair/bed transfer assist level: Moderate Assistance - Patient 50 - 74%     Locomotion Ambulation   Ambulation assist      Assist level: 2 helpers Assistive device: Hand held assist Max distance: 15   Walk 10 feet activity   Assist     Assist  level: 2 helpers Assistive device: Hand held assist   Walk 50 feet activity   Assist Walk 50 feet with 2 turns activity did not occur: Safety/medical concerns (Behavior)         Walk 150 feet activity   Assist Walk 150 feet activity did not occur: Safety/medical concerns (Behavior)         Walk 10 feet on uneven surface  activity   Assist     Assist level: 2 helpers Assistive device: Hand held assist   Wheelchair     Assist Is the patient using a wheelchair?: No             Wheelchair 50 feet with 2 turns activity    Assist            Wheelchair 150 feet activity     Assist          Blood pressure 123/85, pulse 98, temperature 98 F (36.7 C), resp. rate 16, height 5\' 11"  (1.803 m), weight 67.2 kg, SpO2 100 %.  Medical Problem List and Plan: 1. Functional deficits secondary  to TBI, polytrauma, intra-abdominal injuries suffered on 03/08/22             -patient may shower             -ELOS/Goals: 18-24 days, supervision to min assist             -RLAS V/VI  -Continue CIR therapies including PT, OT, and SLP. Interdisciplinary team conference today to discuss goals, barriers to discharge, and dc planning.    2.  Antithrombotics: -DVT/anticoagulation:   Mechanical:  Antiembolism stockings, knee (TED hose)  Bilateral lower extremities -dopplers-Neg 8/9             -antiplatelet therapy: none 3. Pain Management: Tylenol as needed 4. Mood/Behavior/Sleep: agitation has improved             -continue Zyprexa 10 mg daily             -Ativan 0.5 mg BID (changed to PRN)             -Haldol 5 mg q 6 hours prn -Neurontin 100 mg BID -Zoloft 25 mg daily             -antipsychotic agents: see above             -continue sleep chart--is sleeping more consistently  -nicotine patch added 5. Neuropsych/cognition: This patient is not capable of making decisions on his own behalf.             -8/15 ritalin for arousal and initiation--increase to 10mg  to improve functional initiation 6. Skin/Wound Care: routine skin care checks             -routine PEG site care (placed 7/24) 7. Fluids/Electrolytes/Nutrition: Routine Is and Os and follow-up chemistries             -dysphagia 1 diet with thin liquids             -tube feeds>>increased to 60 cc/hr; continue ProSource             -consulted dietician  -8/14 further elevation of BUN--increased h2o flushes further   -recheck labs Thursday 8: Syphilis/neurosyphilis: treated with Rocephin and doxycycline             -  pen G IV q 4 hours completed 8/11 9: HIV positivity: continue Truvada (substituted Descovy) and Tivicay 10: DM-2: continue CBGs, SSI             -  Lantus 18 units BID  CBG (last 3)  Recent Labs    05/15/22 1637 05/15/22 2106 05/16/22 0608  GLUCAP 131* 162* 134*    -Increased to 20 units BID improving 8/13  -8/15  improved control 11: Paroxysmal tachycardia/hypertension: continue diltiazem, metoprolol             -heart rate currently controlled             -mag oxide 400 mg daily   Vitals:   05/15/22 2110 05/16/22 0606  BP: 121/76 123/85  Pulse: 89 98  Resp: 18 16  Temp:  98 F (36.7 C)  SpO2: 99% 100%    12: Urinary retention:  voiding trial, timed voids  -currently incontinent 13: 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 Dysphagia -NPO , bolus tf -MBS on 8/11 -beginning feeding trials per SLP in therapy.   LOS: 7 days A FACE TO FACE EVALUATION WAS PERFORMED  Meredith Staggers 05/16/2022, 11:32 AM

## 2022-05-16 NOTE — Progress Notes (Signed)
Occupational Therapy TBI Note  Patient Details  Name: Ryne Mctigue MRN: [id number] Date of Birth: 09-12-1981  Today's Date: 05/16/2022 OT Individual Time: 0922-1000 OT Individual Time Calculation (min): 38 min   Today's Date: 05/16/2022 OT Individual Time:  -      Short Term Goals: Week 1:  OT Short Term Goal 1 (Week 1): Pt will terminate oral care wiht no more than 2 cues OT Short Term Goal 2 (Week 1): Pt will appropriately use ADL items with no cuing to demo improved ideation OT Short Term Goal 3 (Week 1): Pt will complete functional trnasfers wiht CGA OT Short Term Goal 4 (Week 1): Pt will sequence bathing body parts with MOD cuing OT Short Term Goal 5 (Week 1): Pt will don shirt with supervsion  Skilled Therapeutic Interventions/Progress Updates:    Session 1: Pt received in bed with no pain today.  ADL: Pt completes ADL at overall MIN A Level. Skilled interventions include: skilled motivation/encouragement and use of rest breaks for management of poor initiation/motivation. Pt completes sup>sit with set up and dons shirt with set up after cuing for initiating bed mobility. Pt wanting rest break beofre donning pants. 5 min rest break provided in supine. Pt   Pt left at end of session in bed with exit alarm on, call light in reach and all needs met  Session 2: Pt received in TIS from SLP. No pain reported  Transported pt to tx gym to complete tx in quieter space. Pt requesting to take off shoes and OT provides MOD A to complete as pt wanting to walk to the mat with only 1 shoe on. Pt insists on walking hunched over with NO AD to mat despite cues for upright posture. Pt verbalizes irritation with OT cuing to stay sitting up and to wait for OT to get a pillow case for pillow on the mat. Pt states out of frsutration, " you and Jenny Reichmann just be talking but saying nothing." OT had asked pt to stay sitting up EOM despite pt wanting to lay down on mat. Despite education on sitting upright to  help with core stability and strengthening pt still insists on laying down and states, "leave me alone." Pt given therapeutic rest to trial tolerating therapeutic cuing. After break OT sits with pt on mat and asks for pt to clarify frustration and pt states, "I just feel like anytime you come you dont have a plan for what we will be doing." Educated that OT provides verbal options but pt often declines. Came to agreement for therapist to provide 4 written options for each session so pt knows what the plan will be and has choice and control in some tx. Pt requires significant time to initiate getting to EOM with A for trunk initiation. Pt reporting needing to use bathroom MIN A functional mobility with RW with cuing for more upright posture duing mobility. Pt able to complete 3/3 components of toileting with increased time for small BM in toilet. Pt returns to bed with RW and MIN A.  Pt left at end of session in bed with exit alarm on, call light in reach and all needs met   Therapy Documentation Precautions:  Precautions Precautions: Fall, Other (comment) Precaution Comments: PEG (with abdominal binder covering), unable to fully open R eye, fear/behavioral, impulsive, poor safety awareness Restrictions Weight Bearing Restrictions: No RUE Weight Bearing: Non weight bearing RLE Weight Bearing: Touchdown weight bearing LLE Weight Bearing: Touchdown weight bearing General:  Agitated Behavior Scale: TBI  Observation Details Observation Environment: CIR Start of observation period - Date: 05/16/22 Start of observation period - Time: 1400 End of observation period - Date: 05/16/22 End of observation period - Time: 1435 Agitated Behavior Scale (DO NOT LEAVE BLANKS) Short attention span, easy distractibility, inability to concentrate: Present to a slight degree Impulsive, impatient, low tolerance for pain or frustration: Present to an extreme degree Uncooperative, resistant to care, demanding:  Present to an extreme degree Violent and/or threatening violence toward people or property: Absent Explosive and/or unpredictable anger: Absent Rocking, rubbing, moaning, or other self-stimulating behavior: Absent Pulling at tubes, restraints, etc.: Absent Wandering from treatment areas: Present to a moderate degree Restlessness, pacing, excessive movement: Absent Repetitive behaviors, motor, and/or verbal: Absent Rapid, loud, or excessive talking: Absent Sudden changes of mood: Present to a moderate degree Easily initiated or excessive crying and/or laughter: Absent Self-abusiveness, physical and/or verbal: Absent Agitated behavior scale total score: 25  Therapy/Group: Individual Therapy  Tonny Branch 05/16/2022, 7:00 AM
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
## 2022-05-16 NOTE — Progress Notes (Signed)
Patient ID: Ry Moody, male   DOB: [birth year], 41 y.o.   MRN: [id number]  1300- SW called pt fiance [name] to provide updates from team conference. SW got VM. SW will call back again later.   1547- SW called pt fiance [name] to provide updates from team conference and inform on d/c date 8/30. SW asked if he is available for family meeting next Thursday (8/24) at 10am or 11am. SW waiting on follow-up for him to confirm.   Cecile Sheerer, MSW, LCSWA Office: [phone] Cell: [phone] Fax: [phone]

## 2022-05-16 NOTE — Progress Notes (Signed)
Speech Language Pathology TBI Note  Patient Details  Name: Zachary Hill MRN: [id number] Date of Birth: [birth year]  Today's Date: 05/16/2022 SLP Individual Time: 1305-1400 SLP Individual Time Calculation (min): 55 min  Short Term Goals: Week 1: SLP Short Term Goal 1 (Week 1): Patient will demonstrate sustained attention to functional tasks for 10 minutes with Max verbal cues for redirection. SLP Short Term Goal 2 (Week 1): Patient will demonstrate functional problem solving for basic and familiar tasks with Max A multimodal cues. SLP Short Term Goal 3 (Week 1): Patient will utilize external memory aids for orientation to place, time, and situation with Mod verbal and visual cues. SLP Short Term Goal 4 (Week 1): Patient will verbalize 1 cognitive and 1 physical deficit with Max A multimodal cues. SLP Short Term Goal 5 (Week 1): Patient will participate in repeat MBS to assess swallow function. SLP Short Term Goal 6 (Week 1): Patient will consume trials of ice chips with minimal overt s/s of aspiration with Mod verbal cues for use of swallowing compensatory strategies.  Skilled Therapeutic Interventions: Skilled treatment session focused on cognitive and dysphagia goals. Upon arrival, patient was awake in bed. Patient was slow to initiate but with extra time and moderate encouragement, patient transferred to the wheelchair and agreeable to go to the SLP office. SLP facilitated session by providing a snack of Dys. 1 textures with thin liquids. Patient with initial throat clear but no further overt s/s of aspiration were noted throughout PO intake. Mod verbal cues were needed for use of small bites/sips. SLP also facilitated session by providing Mod-Max verbal cues for functional problem solving and error awareness during a basic money management task. Patient requested to use the bathroom and was continent of bladder with Mod verbal cues needed for safety with task. At end of session, patient able to  utilize schedule with Min verbal cues for recall of therapy sessions but required total A to recall specific events within the treatment session to record in his memory notebook. Throughout session, patient with language of confusion and confabulations that were easily redirected. Patient handed off to OT. Continue with current plan of care.      Pain No/Denies Pain   Agitated Behavior Scale: TBI Observation Details Observation Environment: CIR Start of observation period - Date: 05/16/22 Start of observation period - Time: 1305 End of observation period - Date: 05/16/22 End of observation period - Time: 1400 Agitated Behavior Scale (DO NOT LEAVE BLANKS) Short attention span, easy distractibility, inability to concentrate: Present to a slight degree Impulsive, impatient, low tolerance for pain or frustration: Absent Uncooperative, resistant to care, demanding: Absent Violent and/or threatening violence toward people or property: Absent Explosive and/or unpredictable anger: Absent Rocking, rubbing, moaning, or other self-stimulating behavior: Absent Pulling at tubes, restraints, etc.: Absent Wandering from treatment areas: Absent Restlessness, pacing, excessive movement: Absent Repetitive behaviors, motor, and/or verbal: Absent Rapid, loud, or excessive talking: Absent Sudden changes of mood: Absent Easily initiated or excessive crying and/or laughter: Absent Self-abusiveness, physical and/or verbal: Absent Agitated behavior scale total score: 15  Therapy/Group: Individual Therapy  Zachary Hill 05/16/2022, 2:11 PM
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
## 2022-05-17 DIAGNOSIS — R1312 Dysphagia, oropharyngeal phase: Secondary | ICD-10-CM | POA: Diagnosis not present

## 2022-05-17 DIAGNOSIS — Z21 Asymptomatic human immunodeficiency virus [HIV] infection status: Secondary | ICD-10-CM | POA: Diagnosis not present

## 2022-05-17 DIAGNOSIS — S069X9S Unspecified intracranial injury with loss of consciousness of unspecified duration, sequela: Secondary | ICD-10-CM | POA: Diagnosis not present

## 2022-05-17 DIAGNOSIS — E119 Type 2 diabetes mellitus without complications: Secondary | ICD-10-CM | POA: Diagnosis not present

## 2022-05-17 DIAGNOSIS — S069X3S Unspecified intracranial injury with loss of consciousness of 1 hour to 5 hours 59 minutes, sequela: Secondary | ICD-10-CM

## 2022-05-17 LAB — GLUCOSE, CAPILLARY
Glucose-Capillary: 158 mg/dL — ABNORMAL HIGH (ref 70–99)
Glucose-Capillary: 160 mg/dL — ABNORMAL HIGH (ref 70–99)
Glucose-Capillary: 124 mg/dL — ABNORMAL HIGH (ref 70–99)
Glucose-Capillary: 128 mg/dL — ABNORMAL HIGH (ref 70–99)
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
## 2022-05-17 NOTE — Progress Notes (Signed)
Discussed progress with nursing today. Patient advanced to dysphagia 1 diet by SLP and he ate 100% of lunch. I feel it is appropriate to hold his bolus tube feeds if he consumes >75% of meals.

## 2022-05-17 NOTE — Progress Notes (Signed)
Physical Therapy Weekly Progress Note  Patient Details  Name: Zachary Hill MRN: [id number] Date of Birth: [birth year]  Beginning of progress report period: May 10, 2022 End of progress report period: May 17, 2022  Today's Date: 05/17/2022 PT Individual Time: 1100-1200 PT Individual Time Calculation (min): 60 min   Patient has met 2 of 3 short term goals.  Patient with slow, but steady progress this week. Currently performing bed mobility with supervision-mod I, transfers with CGA with and without RW, gait up to 86 feet using RW, and 4 steps using B rails with mod A. Patient demonstrates improved carry-over of events and cues from session to session, fluctuating orientation, and improved, but continued, reduced initiation and increased fatigue requiring increased time and rest breaks with all therapy sessions. Initiated patient education and will incorporate caregivers in education as available and during scheduled family meeting and hands on training sessions closer to d/c.   Patient continues to demonstrate the following deficits muscle weakness and muscle joint tightness, decreased cardiorespiratoy endurance, decreased attention to right, decreased initiation, decreased attention, decreased awareness, decreased problem solving, decreased safety awareness, decreased memory, delayed processing, and demonstrates behaviors consistent with [place] Level VI, and decreased sitting balance, decreased standing balance, decreased postural control, and decreased balance strategies and therefore will continue to benefit from skilled PT intervention to increase functional independence with mobility.  Patient progressing toward long term goals.  Continue plan of care.  PT Short Term Goals Week 1:  PT Short Term Goal 1 (Week 1): Patient will perform stand pivot transfer with LRAD and minimal assistance PT Short Term Goal 1 - Progress (Week 1): Met PT Short Term Goal 2 (Week 1): Patient will ambulate  x50' with LRAD and moderate assistance x1 PT Short Term Goal 2 - Progress (Week 1): Met PT Short Term Goal 3 (Week 1): Patient will ascend/descend x4 steps with L HR and minimal assistance PT Short Term Goal 3 - Progress (Week 1): Progressing toward goal Week 2:  PT Short Term Goal 1 (Week 2): Patient will perform basic transfers with CGA consistently using LRAD. PT Short Term Goal 2 (Week 2): Patient will ambulate >100 feet using LRAD with min A. PT Short Term Goal 3 (Week 2): Patient will sit OOB >1 hour between therapy sessions. PT Short Term Goal 4 (Week 2): Patient will perform 4 steps with L rail per home set-up with min A.  Skilled Therapeutic Interventions/Progress Updates:     Patient asleep in bed upon PT arrival. Patient very slow to arouse and agreeable to PT session. Patient denied pain during session.  Patient with difficulty maintaining arousal initially. Patient cleared for thin liquids per SLP, patient reports he drinks coffee in the morning usually PTA. Provided patient with coffee (cooled with ice for safety). Patient sipped coffee sitting up throughout session for improved arousal without signs of aspiration.   Educated patient on importance of OOB mobility and sitting OOB for recovery. Discussed patient progress and goals for mobility. Patient stated understanding and in agreement with progressive increased mobility OOB throughout the day. Also dicussed maintaining daytime arousal to improve night time sleep quality.   Patient initiated mobility following education, however, quickly became fatigued and would not continue without a lying rest break. Became slightly agitated when therapist provided encouragement. Utilized patient selected music to promote participation with limited improvement, however, patient did sing and sway to the music intermittently.  Therapeutic Activity: Bed Mobility: Patient performed supine to/from sit with supervision with use of hospital  bed  features x1 and min A for trunk control to come to sitting due to reduced initiation x1. Provided max cues for initiation. Transfers: Patient performed sit to/from stand x1 with min-mod A without AD with L knee buckling in standing.   Patient in bed with music and lights on to promote arousal at end of session with breaks locked, bed alarm set, Telesitter in place, and all needs within reach.   ABS: 19  Therapy Documentation Precautions:  Precautions Precautions: Fall, Other (comment) Precaution Comments: PEG (with abdominal binder covering), unable to fully open R eye, fear/behavioral, impulsive, poor safety awareness Restrictions Weight Bearing Restrictions: No RUE Weight Bearing: Non weight bearing RLE Weight Bearing: Touchdown weight bearing LLE Weight Bearing: Touchdown weight bearing   Therapy/Group: Individual Therapy  Chari Parmenter L Amiah Frohlich PT, DPT, NCS, CBIS  05/17/2022, 4:26 PM

## 2022-05-17 NOTE — Discharge Instructions (Addendum)
Inpatient Rehab Discharge Instructions  Zachary Hill Discharge date and time: 05/31/2022  Activities/Precautions/ Functional Status: Activity: no lifting, driving, or strenuous exercise until cleared by MD Diet:  dysphagia 2 diet with thin liquids:  chop solid foods into fine pieces, chew thoroughly and avoid distractions and conversation while eating Wound Care: keep wound clean and dry Functional status:  ___ No restrictions     ___ Walk up steps independently _x__ 24/7 supervision/assistance   ___ Walk up steps with assistance ___ Intermittent supervision/assistance  ___ Bathe/dress independently ___ Walk with walker     __x_ Bathe/dress with assistance ___ Walk Independently    ___ Shower independently ___ Walk with assistance    __x_ Shower with assistance _x__ No alcohol     ___ Return to work/school ________  Special Instructions: No driving, alcohol consumption or tobacco use.   Do your glucose fingersticks four times a day and record. Take this information with you to your primary care provider.   COMMUNITY REFERRALS UPON DISCHARGE:    Outpatient: PT     OT    ST             Agency: [facility]       Phone: [phone]             Appointment Date/Time: *Please expect follow-up within 7-10 business days to schedule your appointment. If you have not received follow-up, be sure to follow-up with the site directly.*  Medical Equipment/Items Ordered: incontinence supplies                                                  Agency/Supplier: [employer] [phone]; liaison- Anise Salvo [phone]   GENERAL COMMUNITY RESOURCES FOR PATIENT/FAMILY:  Be sure to follow-up with [facility] CAP/DA [phone] to discuss status of referral. Reminder the packets will be coming from [facility] so this could delay the time in which it is received.     My questions have been answered and I understand these instructions. I will adhere to these goals and  the provided educational materials after my discharge from the hospital.  Patient/Caregiver Signature _______________________________ Date __________  Clinician Signature _______________________________________ Date __________  Please bring this form and your medication list with you to all your follow-up doctor's appointments.

## 2022-05-17 NOTE — Progress Notes (Signed)
Speech Language Pathology Weekly Progress and Session Note  Patient Details  Name: Zachary Hill MRN: [id number] Date of Birth: Feb 02, 1981  Beginning of progress report period: May 10, 2012 End of progress report period: May 17, 2022  Today's Date: 05/17/2022 SLP Individual Time: 7353-2992 SLP Individual Time Calculation (min): 55 min  Short Term Goals: Week 1: SLP Short Term Goal 1 (Week 1): Patient will demonstrate sustained attention to functional tasks for 10 minutes with Max verbal cues for redirection. SLP Short Term Goal 1 - Progress (Week 1): Met SLP Short Term Goal 2 (Week 1): Patient will demonstrate functional problem solving for basic and familiar tasks with Max A multimodal cues. SLP Short Term Goal 2 - Progress (Week 1): Met SLP Short Term Goal 3 (Week 1): Patient will utilize external memory aids for orientation to place, time, and situation with Mod verbal and visual cues. SLP Short Term Goal 3 - Progress (Week 1): Met SLP Short Term Goal 4 (Week 1): Patient will verbalize 1 cognitive and 1 physical deficit with Max A multimodal cues. SLP Short Term Goal 4 - Progress (Week 1): Met SLP Short Term Goal 5 (Week 1): Patient will participate in repeat MBS to assess swallow function. SLP Short Term Goal 5 - Progress (Week 1): Met SLP Short Term Goal 6 (Week 1): Patient will consume trials of ice chips with minimal overt s/s of aspiration with Mod verbal cues for use of swallowing compensatory strategies. SLP Short Term Goal 6 - Progress (Week 1): Met    New Short Term Goals: Week 2: SLP Short Term Goal 1 (Week 2): Patient will demonstrate sustained attention to functional tasks for 20 minutes with Max verbal cues for redirection. SLP Short Term Goal 2 (Week 2): Patient will demonstrate functional problem solving for basic and familiar tasks with Mod A multimodal cues. SLP Short Term Goal 3 (Week 2): Patient will utilize external memory aids for orientation to place,  time, and situation with Min verbal and visual cues. SLP Short Term Goal 4 (Week 2): Patient will consume current diet with minimal overt s/s of aspiration with Mod verbal cues for use of swallowing compensatory strategies. SLP Short Term Goal 5 (Week 2): Patient will demonstrate efficient mastication with complete oral clearance without overt s/s of aspiration with Dys. 2 texture trials over 3 sessions prior to upgrade.  Weekly Progress Updates: Patient has made slow but functional gains and has met 6 of 6 STGs this reporting period. Currently, patient demonstrates behaviors consistent with a Rancho Level V and requires overall Max A multimodal cues to complete functional and familiar tasks safely in regards to sustained attention and functional problem solving. Patient also requires Mod verbal cues for use of visual cues/external aids for orientation to place, time and situation. Patient continues to demonstrate intermittent language of confusion and confabulations with progress and participation being limited at times by fatigue. Patient had a repeat MBS and will start on a diet today of Dys. 1 textures with thin liquids with full supervision to maximize safety with PO intake. Patient and family education ongoing. Patient would benefit from continued skilled SLP intervention to maximize his swallowing and cognitive functioning prior to discharge.     Intensity: Minumum of 1-2 x/day, 30 to 90 minutes Frequency: 3 to 5 out of 7 days Duration/Length of Stay: 05/31/22 Treatment/Interventions: Cognitive remediation/compensation;Dysphagia/aspiration precaution training;Internal/external aids;Speech/Language facilitation;Cueing hierarchy;Environmental controls;Therapeutic Activities;Functional tasks;Patient/family education   Daily Session  Skilled Therapeutic Interventions:  Skilled treatment session focused on cognitive and  dysphagia goals. Upon arrival, patient was asleep in bed and required extra time  and moderate verbal and tactile cues to rouse. With extra time, patient transferred to the tilt-in-space wheelchair with Min A. Patient consumed a snack of dys. 1 textures with thin liquids via cup without overt s/s of aspiration and spontaneously utilized multiple swallows. Mod-Max A verbal cues were needed for attention to task as patient's lethargy was impacting his overall PO intake. Recommend patient initiate a diet of Dys. 1 textures with thin liquids via cup with full supervision to maximize safety with PO intake. Patient requested to return to bed after being in wheelchair for ~30 minutes despite encouragement from SLP. Mod verbal cues were needed for safety with transfer due to impulsivity. Once back in bed, Max-Total A multimodal cues were needed for recall of events from previous therapy sessions to record in his memory notebook. Patient left upright in bed with alarm on and all needs within reach. Continue with current plan of care.    Pain Pain Assessment Pain Scale: 0-10 Pain Score: 0-No pain  Therapy/Group: Individual Therapy  Tara Wich 05/17/2022, 11:25 AM

## 2022-05-17 NOTE — Progress Notes (Signed)
Occupational Therapy Weekly Progress Note  Patient Details  Name: Zachary Hill MRN: [id number] Date of Birth: [birth year]  Beginning of progress report period: May 10, 2022 End of progress report period: May 17, 2022  Today's Date: 05/17/2022 OT Individual Time: 4580-9983 OT Individual Time Calculation (min): 75 min    Patient has met 4 of 5 short term goals.  Pt is making steady progress towards OT goals at this time. While pt can be as good as supervision for ADL, pt continues to requires heavy cuing and encouragement for initiation, sequencing and termination of ADL tasks. Pts participation in [place] tx has fluctuated and been impaired by his cognitive deficits. Plan to have family conference in the next reporting period to discuss with support system what he will need at home  Patient continues to demonstrate the following deficits: muscle weakness, decreased cardiorespiratoy endurance, decreased coordination, decreased visual perceptual skills, decreased attention to right, decreased initiation, decreased attention, decreased awareness, decreased problem solving, decreased safety awareness, decreased memory, delayed processing, and demonstrates behaviors consistent with Rancho Level 5 emerging 6, and decreased sitting balance, decreased standing balance, decreased postural control, and decreased balance strategies and therefore will continue to benefit from skilled OT intervention to enhance overall performance with BADL and iADL.  Patient progressing toward long term goals..  Continue plan of care.  OT Short Term Goals Week 1:  OT Short Term Goal 1 (Week 1): Pt will terminate oral care wiht no more than 2 cues OT Short Term Goal 1 - Progress (Week 1): Progressing toward goal OT Short Term Goal 2 (Week 1): Pt will appropriately use ADL items with no cuing to demo improved ideation OT Short Term Goal 2 - Progress (Week 1): Met OT Short Term Goal 3 (Week 1): Pt will complete functional  trnasfers wiht CGA OT Short Term Goal 3 - Progress (Week 1): Met OT Short Term Goal 4 (Week 1): Pt will sequence bathing body parts with MOD cuing OT Short Term Goal 4 - Progress (Week 1): Met OT Short Term Goal 5 (Week 1): Pt will don shirt with supervsion OT Short Term Goal 5 - Progress (Week 1): Met  Skilled Therapeutic Interventions/Progress Updates:     17 ABS  Pt received in bed with no pain. Pt provided with 3 options for session: shower, balance/games, and vision. Pt stated, "whatever you think we should do." Pt agreeable to shower. ADL: Pt completes ADL at overall supervision-CGA Level. Skilled interventions include: utilized music to motivate pt to get up OOB which both helped initiation of bed mobility, however hindered initiation of sit to stand and termination of shower. Used # of songs to help terminate shower as pt bathed body/hair parts 3x despite question cuing. When asked how many times he normally washes hair, pt stated, "well ive been here for so long it is really dirty." Pt initially trying to stand for entire shower demoing poor awareness to endurance and balance deficits. Pt with incredibly stooped posture and when asked ~5 min into shower if pt wanted to sit, initially pt declines but then states, "actually I'm getting tired, let me have that chair." Pt requires time to process and terminate shower with gentle cuing as this was pts first shower in over 2 months. Pt often singing along with music in shower and in overall good spirits. Pt requires A to put on lotion d/t time constraints and dons UB clothing with MIN A to don tight tank under shirt, S for tshirt and CGA for  LB clothing.  Patient required increased time for initiation, cuing, rest breaks, and for completion of tasks throughout session. Utilized therapeutic use of self throughout to promote efficiency.   Pt left at end of session in bed with exit alarm on, call light in reach and all needs met   Therapy  Documentation Precautions:  Precautions Precautions: Fall, Other (comment) Precaution Comments: PEG (with abdominal binder covering), unable to fully open R eye, fear/behavioral, impulsive, poor safety awareness Restrictions Weight Bearing Restrictions: No RUE Weight Bearing: Non weight bearing RLE Weight Bearing: Touchdown weight bearing LLE Weight Bearing: Touchdown weight bearing  Therapy/Group: Individual Therapy  Tonny Branch 05/17/2022, 6:51 AM

## 2022-05-17 NOTE — Progress Notes (Signed)
PROGRESS NOTE   Subjective/Complaints:  No new issues overnight. Says he's comfortable this morning. Appears to have slept  ROS: Limited due to cognitive/behavioral    Objective:   No results found. Recent Labs    05/15/22 0541  WBC 6.0  HGB 10.4*  HCT 33.0*  PLT 306   Recent Labs    05/15/22 0541  NA 134*  K 4.3  CL 99  CO2 25  GLUCOSE 143*  BUN 25*  CREATININE 0.95  CALCIUM 10.8*   No intake or output data in the 24 hours ending 05/17/22 0830      Physical Exam: Vital Signs Blood pressure 105/64, pulse 90, temperature 97.6 F (36.4 C), resp. rate 16, height 5\' 11"  (1.803 m), weight 67.2 kg, SpO2 98 %.  Constitutional: No distress . Vital signs reviewed. HEENT: NCAT, EOMI, oral membranes moist Neck: supple Cardiovascular: RRR without murmur. No JVD    Respiratory/Chest: CTA Bilaterally without wheezes or rales. Normal effort    GI/Abdomen: BS +, non-tender, non-distended, +PEG Ext: no clubbing, cyanosis, or edema Psych: pleasant and cooperative  Skin: No evidence of breakdown, no evidence of rash. Scarring about abdomen.  Musculoskeletal:     Cervical back: Neck supple.     Right lower leg: No edema.     Left lower leg: No edema.  Skin:    General: Skin is warm and dry.     G tube site ok, right face/eye scarring present  Neurological:     Comments: oriented to hospital and TBI and self. Follows basic commands sometimes with delay.  Fair insight but still lacking awareness of medical condition, functional abilities.   Moved all 4 limbs with no visible limitations.      Assessment/Plan: 1. Functional deficits which require 3+ hours per day of interdisciplinary therapy in a comprehensive inpatient rehab setting. Physiatrist is providing close team supervision and 24 hour management of active medical problems listed below. Physiatrist and rehab team continue to assess barriers to discharge/monitor  patient progress toward functional and medical goals  Care Tool:  Bathing  Bathing activity did not occur: Refused           Bathing assist       Upper Body Dressing/Undressing Upper body dressing   What is the patient wearing?: Pull over shirt    Upper body assist Assist Level: Supervision/Verbal cueing    Lower Body Dressing/Undressing Lower body dressing      What is the patient wearing?: Pants     Lower body assist Assist for lower body dressing: Contact Guard/Touching assist     Toileting Toileting    Toileting assist Assist for toileting: Total Assistance - Patient < 25%     Transfers Chair/bed transfer  Transfers assist  Chair/bed transfer activity did not occur: Safety/medical concerns  Chair/bed transfer assist level: Moderate Assistance - Patient 50 - 74%     Locomotion Ambulation   Ambulation assist      Assist level: 2 helpers Assistive device: Hand held assist Max distance: 15   Walk 10 feet activity   Assist     Assist level: 2 helpers Assistive device: Hand held assist   Walk 50  feet activity   Assist Walk 50 feet with 2 turns activity did not occur: Safety/medical concerns (Behavior)         Walk 150 feet activity   Assist Walk 150 feet activity did not occur: Safety/medical concerns (Behavior)         Walk 10 feet on uneven surface  activity   Assist     Assist level: 2 helpers Assistive device: Hand held assist   Wheelchair     Assist Is the patient using a wheelchair?: No             Wheelchair 50 feet with 2 turns activity    Assist            Wheelchair 150 feet activity     Assist          Blood pressure 105/64, pulse 90, temperature 97.6 F (36.4 C), resp. rate 16, height 5\' 11"  (1.803 m), weight 67.2 kg, SpO2 98 %.  Medical Problem List and Plan: 1. Functional deficits secondary to TBI, polytrauma, intra-abdominal injuries suffered on 03/08/22              -patient may shower             -ELOS/Goals: 18-24 days, supervision to min assist             -RLAS V/VI  -Continue CIR therapies including PT, OT, and SLP    2.  Antithrombotics: -DVT/anticoagulation:   Mechanical:  Antiembolism stockings, knee (TED hose)  Bilateral lower extremities -dopplers-Neg 8/9             -antiplatelet therapy: none 3. Pain Management: Tylenol as needed 4. Mood/Behavior/Sleep: agitation has improved             -continue Zyprexa 10 mg daily             -Ativan 0.5 mg BID (changed to PRN)             -Haldol 5 mg q 6 hours prn -Neurontin 100 mg BID -Zoloft 25 mg daily             -antipsychotic agents: see above             -continue sleep chart--is sleeping more consistently  -nicotine patch added 5. Neuropsych/cognition: This patient is not capable of making decisions on his own behalf.             -8/15 ritalin for arousal and initiation--increase to 10mg  to improve functional initiation 6. Skin/Wound Care: routine skin care checks             -routine PEG site care (placed 7/24) 7. Fluids/Electrolytes/Nutrition: Routine Is and Os and follow-up chemistries             -dysphagia 1 diet with thin liquids             -tube feeds>>increased to 60 cc/hr; continue [employer]             -consulted dietician  -8/14 further elevation of BUN--increased h2o flushes    -8/15 recheck labs Thursday 8: Syphilis/neurosyphilis: treated with Rocephin and doxycycline             -  pen G IV q 4 hours completed 8/11 9: HIV positivity: continue Truvada (substituted Descovy) and Tivicay 10: DM-2: continue CBGs, SSI             -Lantus 18 units BID  CBG (last 3)  Recent Labs    05/16/22 1712  05/16/22 2139 05/17/22 0557  GLUCAP 175* 191* 158*    -Increased to 20 units BID improving 8/13  -8/16 slight bump recently, but improved control--no changes 11: Paroxysmal tachycardia/hypertension: continue diltiazem, metoprolol             -heart rate currently controlled              -mag oxide 400 mg daily   Vitals:   05/16/22 1935 05/17/22 0327  BP: 123/83 105/64  Pulse: 100 90  Resp: 17 16  Temp: 99.3 F (37.4 C) 97.6 F (36.4 C)  SpO2: 100% 98%    12: Urinary retention:  voiding trial, timed voids  -currently incontinent 13: 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 Dysphagia -NPO currently, bolus tf -MBS on 8/11 -begin diet per SLP recs this week  LOS: 8 days A FACE TO FACE EVALUATION WAS PERFORMED  Meredith Staggers 05/17/2022, 8:30 AM

## 2022-05-18 DIAGNOSIS — E119 Type 2 diabetes mellitus without complications: Secondary | ICD-10-CM | POA: Diagnosis not present

## 2022-05-18 DIAGNOSIS — Z21 Asymptomatic human immunodeficiency virus [HIV] infection status: Secondary | ICD-10-CM | POA: Diagnosis not present

## 2022-05-18 DIAGNOSIS — R1312 Dysphagia, oropharyngeal phase: Secondary | ICD-10-CM | POA: Diagnosis not present

## 2022-05-18 DIAGNOSIS — S069X9S Unspecified intracranial injury with loss of consciousness of unspecified duration, sequela: Secondary | ICD-10-CM | POA: Diagnosis not present

## 2022-05-18 LAB — BASIC METABOLIC PANEL
Anion gap: 8 (ref 5–15)
BUN: 27 mg/dL — ABNORMAL HIGH (ref 6–20)
CO2: 27 mmol/L (ref 22–32)
Calcium: 11 mg/dL — ABNORMAL HIGH (ref 8.9–10.3)
Chloride: 102 mmol/L (ref 98–111)
Creatinine, Ser: 1.13 mg/dL (ref 0.61–1.24)
GFR, Estimated: >60 mL/min (ref >60)
Glucose, Bld: 134 mg/dL — ABNORMAL HIGH (ref 70–99)
Potassium: 4.2 mmol/L (ref 3.5–5.1)
Sodium: 137 mmol/L (ref 135–145)

## 2022-05-18 LAB — GLUCOSE, CAPILLARY
Glucose-Capillary: 106 mg/dL — ABNORMAL HIGH (ref 70–99)
Glucose-Capillary: 140 mg/dL — ABNORMAL HIGH (ref 70–99)
Glucose-Capillary: 144 mg/dL — ABNORMAL HIGH (ref 70–99)

## 2022-05-18 NOTE — Progress Notes (Signed)
Physical Therapy Session Note  Patient Details  Name: Zachary Hill MRN: [id number] Date of Birth: 08/20/1981  Today's Date: 05/18/2022 PT Individual Time: 1130-1200 and 1330-1430 PT Individual Time Calculation (min): 30 min and 60 min  Short Term Goals: Week 2:  PT Short Term Goal 1 (Week 2): Patient will perform basic transfers with CGA consistently using LRAD. PT Short Term Goal 2 (Week 2): Patient will ambulate >100 feet using LRAD with min A. PT Short Term Goal 3 (Week 2): Patient will sit OOB >1 hour between therapy sessions. PT Short Term Goal 4 (Week 2): Patient will perform 4 steps with L rail per home set-up with min A.  Skilled Therapeutic Interventions/Progress Updates:     Session 1: Patient in bed upon PT arrival. Patient alert and agreeable to PT session. Patient denied pain during session.  Patient with improved initiation and participation this session. Patient oriented to self, place, situation, and stated the date was May 28, 2020, able to correct date to current date using calendar with mod cues.   Therapeutic Activity: Bed Mobility: Patient performed supine to/from sit with supervision with use of hospital bed features. Provided verbal cues for initiation and increased independence with mobility, as patient continues to reach out to PT for assist. Transfers: Patient performed sit to/from stand x5 with CGA using RW. Provided verbal cues for hand placement, forward weight shift, safe use of AD, and reaching back to sit.  Gait Training:  Patient ambulated 10 feet, 70 feet x2, and 25 feet using RW with CGA. Ambulated with decreased gait speed, decreased step length and height R>L, mild R antalgic gait, increased forward trunk lean (improved from previous sessions, no leaning on forearms throughout), and downward head gaze. Provided verbal cues for erect posture as tolerated, increased gait speed and step height, and reciprocal gait pattern for increased R weight  bearing as tolerated. Patient ascended/descended 4x6" steps using B hands on L rail to simulate home set-up with min A-CGA. Performed step-to gait pattern leading with L while ascending and R while descending. Provided demonstration and max cues for technique and sequencing throughout.   Patient sitting up in recliner drinking juice without signs of aspiration at end of session with breaks locked, seat belt alarm set, and all needs within reach. Patient agreeable to sit up for lunch following session.   ABS 15  Session 2: Patient in bed asleep upon PT arrival. Patient slow to arouse and agreeable to PT session. Patient denied pain during session.  Patient reported increased fatigue from morning therapy sessions. Reported that he enjoyed eating lunch in the recliner today. Offered to perform scar tissue massage for abdominal scar at incision site to promote improved trunk mobility, patient declined, reported he did not want to be touched at this time. Spent increased time reviewing patient's current deficits and mobility level. Patient appropriately identified several cognitive and functional deficits limiting independence at this time. Patient demonstrated improved incite into deficits as he stated he does not want to burden his partner with his care at d/c, states he is aware Zachary Hill (SO) has to care for his mother and work and stated that he would prefer to return to his home with his grandmother at d/c to reduce the burden of care on Zachary Hill. Patient reports his POA and primary contact is his pastor and would have some incite into this d/c plan as they have known each other >20 years. CSW made aware of patient's request and patient agreeable to CSW speaking  with his primary contacts about this.   Patient expressed distress over his present deficits and concerns about family dynamics at d/c. PT provided therapeutic listening and education on coping strategies and importance of prioritizing his recovery  and asking for assistance at this time. Patient receptive to education.   Patient agreeable to going outside this afternoon. Tolerated increased environmental stimulation in community environment well >10 min and very appreciative of going outside after.   Therapeutic Activity: Bed Mobility: Patient performed supine to/from sit as above. Patient noted to be incontinent of bladder upon sitting up. Doffed/donned pants with supervision and doffed/donned incontinence brief with total A. Patient declined peri-care and unwilling to participate in peri-care initially. Did participate once in the bathroom with set-up assist. Transfers: Patient performed sit to/from stand x3 and toilet transfer x1 with CGA-close supervision using RW. Provided verbal cues as above.  Gait Training:  Patient ambulated 15 feet x4 using RW with CGA, as above.   Patient in bed at end of session with breaks locked, bed alarm set, 4 rails up as restraint per orders, Telesitter in place, and all needs within reach.   ABS 16  Therapy Documentation Precautions:  Precautions Precautions: Fall, Other (comment) Precaution Comments: PEG (with abdominal binder covering), unable to fully open R eye, fear/behavioral, impulsive, poor safety awareness Restrictions Weight Bearing Restrictions: No RUE Weight Bearing: Non weight bearing RLE Weight Bearing: Touchdown weight bearing LLE Weight Bearing: Touchdown weight bearing    Therapy/Group: Individual Therapy  Zachary Hill L Zachary Hill PT, DPT, NCS, CBIS  05/18/2022, 5:55 PM

## 2022-05-18 NOTE — Progress Notes (Signed)
Occupational Therapy TBI Note  Patient Details  Name: Zachary Hill MRN: [id number] Date of Birth: [birth year]  Today's Date: 05/18/2022 OT Individual Time: 5366-4403 OT Individual Time Calculation (min): 69 min    Short Term Goals: Week 1:  OT Short Term Goal 1 (Week 1): Pt will terminate oral care wiht no more than 2 cues OT Short Term Goal 1 - Progress (Week 1): Progressing toward goal OT Short Term Goal 2 (Week 1): Pt will appropriately use ADL items with no cuing to demo improved ideation OT Short Term Goal 2 - Progress (Week 1): Met OT Short Term Goal 3 (Week 1): Pt will complete functional trnasfers wiht CGA OT Short Term Goal 3 - Progress (Week 1): Met OT Short Term Goal 4 (Week 1): Pt will sequence bathing body parts with MOD cuing OT Short Term Goal 4 - Progress (Week 1): Met OT Short Term Goal 5 (Week 1): Pt will don shirt with supervsion OT Short Term Goal 5 - Progress (Week 1): Met  Skilled Therapeutic Interventions/Progress Updates:     Pt received in EOB with with no pain. ADL: Pt completes ambulation in room with RW with stooped posture despite cuing and R trunk shortening. Pt completes oral care in standing at sink with forearms on sink with improved termination than when sitting d/t poor activity tolerance lending him to need to sit from fatigue. Attempted to direct pr to sit in standard chair, however pt states, "let me sit on the bed." Pt gived 5 min break to reset before attempting to get pt to get OOB.   Therapeutic activity Pt completes functional mobility to/from room part way each direction w~75 feet prior to trying to rest forearms on walker and OT initated a rest break. Overall pt demo some R inattention with decrease head/trunk turning to complensate for vision loss during mobility requiring increased cuing. Pt completes seated Wii bowling for 4 frames with moderate successs following commands, however once other people enter gym pt loses attention.   BITS  activities improved from first/second session: Scanning first 15 letters with 1 min 4 second after 1 cue to recall taks Bells cancelation test in 3 min with 13 misses across whole board. Improved R scanning and able to only hit bells this date demoing improved command following  Attempted pt to fill out memory notebook but only could recall with context clues going to sink to brush teeth. Unable to recall bell test which was just 5 min prior.  Pt left at end of session in bed with exit alarm on, call light in reach and all needs met   Therapy Documentation Precautions:  Precautions Precautions: Fall, Other (comment) Precaution Comments: PEG (with abdominal binder covering), unable to fully open R eye, fear/behavioral, impulsive, poor safety awareness Restrictions Weight Bearing Restrictions: No RUE Weight Bearing: Non weight bearing RLE Weight Bearing: Touchdown weight bearing LLE Weight Bearing: Touchdown weight bearing General:   Agitated Behavior Scale: TBI  Observation Details Observation Environment: pt room Start of observation period - Date: 05/18/22 Start of observation period - Time: 0930 End of observation period - Date: 05/18/22 End of observation period - Time: 1045 Agitated Behavior Scale (DO NOT LEAVE BLANKS) Short attention span, easy distractibility, inability to concentrate: Present to a slight degree Impulsive, impatient, low tolerance for pain or frustration: Present to a slight degree Uncooperative, resistant to care, demanding: Absent Violent and/or threatening violence toward people or property: Absent Explosive and/or unpredictable anger: Absent Rocking, rubbing, moaning, or other self-stimulating  behavior: Absent Pulling at tubes, restraints, etc.: Absent Wandering from treatment areas: Absent Restlessness, pacing, excessive movement: Absent Repetitive behaviors, motor, and/or verbal: Absent Rapid, loud, or excessive talking: Absent Sudden changes of  mood: Absent Easily initiated or excessive crying and/or laughter: Absent Self-abusiveness, physical and/or verbal: Absent Agitated behavior scale total score: 16   Therapy/Group: Individual Therapy  Tonny Branch 05/18/2022, 12:11 PM

## 2022-05-18 NOTE — Progress Notes (Signed)
Patient exited bed and found to be standing at bedside with hands on arms of recliner chair. Noted an odor of bowel movement. Attempted to bring patient to restroom. While he was initially agreeable he quickly declined. Attempted to bring patient to the bathroom x2. Patient declined toiletting attempt and refused to let staff check his brief. He stated that if he "had gone to the bathroom he would know it". Assisted patient to a lying position in the bed. Care ongoing. Bed alarm on with call bells in reach.

## 2022-05-18 NOTE — Progress Notes (Signed)
Speech Language Pathology TBI Note  Patient Details  Name: Zachary Hill MRN: [id number] Date of Birth: 06-30-1981  Today's Date: 05/18/2022 SLP Individual Time: 0725-0825 SLP Individual Time Calculation (min): 60 min  Short Term Goals: Week 2: SLP Short Term Goal 1 (Week 2): Patient will demonstrate sustained attention to functional tasks for 20 minutes with Max verbal cues for redirection. SLP Short Term Goal 2 (Week 2): Patient will demonstrate functional problem solving for basic and familiar tasks with Mod A multimodal cues. SLP Short Term Goal 3 (Week 2): Patient will utilize external memory aids for orientation to place, time, and situation with Min verbal and visual cues. SLP Short Term Goal 4 (Week 2): Patient will consume current diet with minimal overt s/s of aspiration with Mod verbal cues for use of swallowing compensatory strategies. SLP Short Term Goal 5 (Week 2): Patient will demonstrate efficient mastication with complete oral clearance without overt s/s of aspiration with Dys. 2 texture trials over 3 sessions prior to upgrade.  Skilled Therapeutic Interventions: Skilled treatment session focused on dysphagia and cognitive goals. Upon arrival, patient was awake in bed and agreeable to treatment session. Patient sat edge of bed and consumed his meal of Dys. 1 textures with thin liquids without overt s/s of aspiration with Min verbal cues needed for use of swallowing compensatory strategies. Recommend patient continue current diet. SLP also facilitated session by setting up an "orientation station" to maximize recall of orientation information in which patient utilized with Mod verbal cues. Patient was incontinent of bowel and bladder with minimal awareness and required Mod verbal cues to initiate getting out of bed and going to the bathroom to change his brief and pants. Max verbal cues were needed for attention and safety with task. Throughout session, patient was pleasant but with  language of confusion with confabulations. Patient transferred back to bed at end of session and left with alarm on and all needs within reach. Continue with current plan of care.      Pain No/Denies Pain   Agitated Behavior Scale: TBI Observation Details Observation Environment: CIR Start of observation period - Date: 05/18/22 Start of observation period - Time: 0725 End of observation period - Date: 05/18/22 End of observation period - Time: 0825 Agitated Behavior Scale (DO NOT LEAVE BLANKS) Short attention span, easy distractibility, inability to concentrate: Present to a slight degree Impulsive, impatient, low tolerance for pain or frustration: Present to a slight degree Uncooperative, resistant to care, demanding: Present to a slight degree Violent and/or threatening violence toward people or property: Absent Explosive and/or unpredictable anger: Absent Rocking, rubbing, moaning, or other self-stimulating behavior: Absent Pulling at tubes, restraints, etc.: Absent Wandering from treatment areas: Absent Restlessness, pacing, excessive movement: Absent Repetitive behaviors, motor, and/or verbal: Absent Rapid, loud, or excessive talking: Absent Sudden changes of mood: Absent Easily initiated or excessive crying and/or laughter: Absent Self-abusiveness, physical and/or verbal: Absent Agitated behavior scale total score: 17  Therapy/Group: Individual Therapy  Maisie Hauser 05/18/2022, 3:09 PM
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
## 2022-05-19 DIAGNOSIS — R1312 Dysphagia, oropharyngeal phase: Secondary | ICD-10-CM | POA: Diagnosis not present

## 2022-05-19 DIAGNOSIS — S069X9S Unspecified intracranial injury with loss of consciousness of unspecified duration, sequela: Secondary | ICD-10-CM | POA: Diagnosis not present

## 2022-05-19 DIAGNOSIS — E119 Type 2 diabetes mellitus without complications: Secondary | ICD-10-CM | POA: Diagnosis not present

## 2022-05-19 DIAGNOSIS — Z21 Asymptomatic human immunodeficiency virus [HIV] infection status: Secondary | ICD-10-CM | POA: Diagnosis not present

## 2022-05-19 LAB — BASIC METABOLIC PANEL
Anion gap: 8 (ref 5–15)
BUN: 28 mg/dL — ABNORMAL HIGH (ref 6–20)
CO2: 26 mmol/L (ref 22–32)
Calcium: 10.8 mg/dL — ABNORMAL HIGH (ref 8.9–10.3)
Chloride: 102 mmol/L (ref 98–111)
Creatinine, Ser: 0.92 mg/dL (ref 0.61–1.24)
GFR, Estimated: >60 mL/min (ref >60)
Glucose, Bld: 118 mg/dL — ABNORMAL HIGH (ref 70–99)
Potassium: 3.9 mmol/L (ref 3.5–5.1)
Sodium: 136 mmol/L (ref 135–145)

## 2022-05-19 LAB — GLUCOSE, CAPILLARY
Glucose-Capillary: 127 mg/dL — ABNORMAL HIGH (ref 70–99)
Glucose-Capillary: 104 mg/dL — ABNORMAL HIGH (ref 70–99)
Glucose-Capillary: 106 mg/dL — ABNORMAL HIGH (ref 70–99)
Glucose-Capillary: 158 mg/dL — ABNORMAL HIGH (ref 70–99)

## 2022-05-19 MED ORDER — FREE WATER
250.0000 mL | Status: DC
Start: 2022-05-19 — End: 2022-05-24
  Administered 2022-05-19 – 2022-05-24 (×26): 250 mL

## 2022-05-19 NOTE — Progress Notes (Signed)
Physical Therapy Session Note  Patient Details  Name: Zachary Hill MRN: [id number] Date of Birth: [birth year]  Today's Date: 05/19/2022 PT Individual Time: 1305-1400 PT Individual Time Calculation (min): 55 min   Short Term Goals: Week 1:  PT Short Term Goal 1 (Week 1): Patient will perform stand pivot transfer with LRAD and minimal assistance PT Short Term Goal 1 - Progress (Week 1): Met PT Short Term Goal 2 (Week 1): Patient will ambulate x50' with LRAD and moderate assistance x1 PT Short Term Goal 2 - Progress (Week 1): Met PT Short Term Goal 3 (Week 1): Patient will ascend/descend x4 steps with L HR and minimal assistance PT Short Term Goal 3 - Progress (Week 1): Progressing toward goal Week 2:  PT Short Term Goal 1 (Week 2): Patient will perform basic transfers with CGA consistently using LRAD. PT Short Term Goal 2 (Week 2): Patient will ambulate >100 feet using LRAD with min A. PT Short Term Goal 3 (Week 2): Patient will sit OOB >1 hour between therapy sessions. PT Short Term Goal 4 (Week 2): Patient will perform 4 steps with L rail per home set-up with min A.  Skilled Therapeutic Interventions/Progress Updates:   Pt received supine in bed and agreeable to PT. Supine>sit transfer with min assist and cues to intiate movement. Noted incontinent bowel movement. Ambulatory transfer to toilet with RW and supervision assist. Pt able to perform hygiene with set up assist from PT and multiple seated rest breaks. Sit<>stand from low toilet with supervision assist throughout session. Hand hygiene at sink with supervision assist with cues for propewr use of soap.  RN then present to administer medication. Pt transproted to entrance of [place]. Gait tranining with RW and supervision assist x 71f and 218fwith cues for posture initially. [facility] returned to [facility] and all needs met.         Therapy Documentation Precautions:   Precautions Precautions: Fall, Other (comment) Precaution Comments: PEG (with abdominal binder covering), unable to fully open R eye, fear/behavioral, impulsive, poor safety awareness Restrictions Weight Bearing Restrictions: No    Pain denies   Therapy/Group: Individual Therapy  AuLorie Phenix/18/2023, 2:04 PM

## 2022-05-19 NOTE — Progress Notes (Signed)
Patient ID: Zachary Hill, male   DOB: 30-Jul-1981, 41 y.o.   MRN: [id number]  SW received return phone call from pt fiance Zachary Hill about family meeting. He reports he is available to meet on Thursday (8/24) at 10am. He is also willing to be available for family education 10:30am-4pm as well on this day. He indicates he is still waiting on the CAP/DA forms to be mailed to the home. States that he has a friend from church that will stay with him until he gets home from work.   Cecile Sheerer, MSW, LCSWA Office: [phone] Cell: [phone] Fax: [phone]

## 2022-05-19 NOTE — Progress Notes (Signed)
Patient had very good night. Slept through the night. Tolerated care from staff. Patient remained incontinent through the night.

## 2022-05-19 NOTE — Progress Notes (Signed)
Occupational Therapy TBI Note  Patient Details  Name: Zachary Hill MRN: [id number] Date of Birth: 10-Oct-1980  Today's Date: 05/19/2022 OT Individual Time: 6378-5885 OT Individual Time Calculation (min): 70 min   Today's Date: 05/19/2022 OT Individual Time: 1400-1430 OT Individual Time Calculation (min): 30 min   Short Term Goals: Week 2:  OT Short Term Goal 1 (Week 2): Pt will appropriately terminate grooming tasks at sink wiht no more than 2 cues OT Short Term Goal 2 (Week 2): Pt will initate mobility throughout session wiht no more than MOD cuing OT Short Term Goal 3 (Week 2): Pt will request use of shower chiar to demo improved awareness into deficits OT Short Term Goal 4 (Week 2): Pt will complete dressing in <10 min to demo improved initiation  Skilled Therapeutic Interventions/Progress Updates:    10 min prior to session OT walks by and orients pt to intention of showering during session. Pt agreeable upon arrival Pt received in bed with no pain  ADL: Pt completes ADL at overall supervision-CGA Level, but requires heavy skilled cuing and redirection for safety d/t disorganized thinking, impaired initiation, termination and awareness. Pt completes BADL at shower level with OT setting up shower with shower chair which when asked pt states, "I think it would be better to sit for the shower." Pt ambulates throughout with RW, supervision and stooped posture. Pt initially walks into bathrooms, undresses on the toilet then walks out of the bathroom asking OT to close the curtain to the shower. Pt then redirected back to the bathroom, sits to shwoer with use of timer to manage pacing with OT behind curtain using reflective surface of the shower faucet to obseve sequencing and safety with standing while also giving pt sense of privacy. Pt able to sequence and pace appropriately with cuing for time left even terminating shower before 20 min timer goes off. Pt able to dress with cuing for pacing  and thoroughly drying/applying lotion. Pt often would sit back and require a cue to start next step while OT tidying up room to remain looking busy to not bother pt by hovering over him.   Pt left at end of session in bed with exit alarm on, call light in reach and all needs met   Session 2: Pt received in bed with back pain. Continued reinforcement of sitting OOB to help with back pain. Pt verbalized understanding but no evidence of learning as wants to get back in bed at end of session. Pt completes back LB stretches as written below for pain  ADL: Pt completes toileting with supervision with cuing for safe use of RW. Pt unable to have BM after increased time. Pt completes mobility throughotu room/hallway with S and stooped posture despite cuing to stand up with tall posture.   Therapeutic exercise 1x30-45 sec each Hamstring BLE Supine twist SL knee to chest BLE SL piriformis stretch  Pt left at end of session in bed with exit alarm on, call light in reach and all needs met   Therapy Documentation Precautions:  Precautions Precautions: Fall, Other (comment) Precaution Comments: PEG (with abdominal binder covering), unable to fully open R eye, fear/behavioral, impulsive, poor safety awareness Restrictions Weight Bearing Restrictions: No RUE Weight Bearing: Non weight bearing RLE Weight Bearing: Touchdown weight bearing LLE Weight Bearing: Touchdown weight bearing General:    Therapy/Group: Individual Therapy  Tonny Branch 05/19/2022, 6:52 AM

## 2022-05-19 NOTE — Progress Notes (Signed)
Speech Language Pathology TBI Note  Patient Details  Name: Zachary Hill MRN: [id number] Date of Birth: 1980/10/04  Today's Date: 05/19/2022 SLP Individual Time: 0720-0815 SLP Individual Time Calculation (min): 55 min  Short Term Goals: Week 2: SLP Short Term Goal 1 (Week 2): Patient will demonstrate sustained attention to functional tasks for 20 minutes with Max verbal cues for redirection. SLP Short Term Goal 2 (Week 2): Patient will demonstrate functional problem solving for basic and familiar tasks with Mod A multimodal cues. SLP Short Term Goal 3 (Week 2): Patient will utilize external memory aids for orientation to place, time, and situation with Min verbal and visual cues. SLP Short Term Goal 4 (Week 2): Patient will consume current diet with minimal overt s/s of aspiration with Mod verbal cues for use of swallowing compensatory strategies. SLP Short Term Goal 5 (Week 2): Patient will demonstrate efficient mastication with complete oral clearance without overt s/s of aspiration with Dys. 2 texture trials over 3 sessions prior to upgrade.  Skilled Therapeutic Interventions: Skilled treatment session focused on dysphagia and cognitive goals. Treatment session started early due to patient attempting to get out of the bed without assistance. Upon arrival, patient was attempting to walk to the bathroom as he had been incontinent of bowel and bladder. SLP facilitated session by providing total A for peri care with Mod verbal cues needed to maintain standing for ~2 minute intervals. Patient also donned a new shirt independently and SLP assisted in threading his pants. Patient sat up in the wheelchair to consume his breakfast meal of Dys. 1 textures with thin liquids. Patient consumed ~50% of meal without overt s/s of aspiration but required Mod verbal cues for sustained attention to task as patient was frequently requesting to return to bed. Patient eventually transferred back to bed due to  increased impulsivity with attempting to stand from wheelchair. Patient utilized external aids for orientation to place, time and situation with Min verbal cues but requires Max-Total A for recall of events from therapy session to record in his memory notebook. SLP assisted patient with making goals for next week that focused on spending more time out of bed. Patient left upright in bed with alarm on and all needs within reach. Continue with current plan of care.      Pain No/Denies Pain   Agitated Behavior Scale: TBI Observation Details Observation Environment: pt room Start of observation period - Date: 05/19/22 Start of observation period - Time: 0720 End of observation period - Date: 05/19/22 End of observation period - Time: 0815 Agitated Behavior Scale (DO NOT LEAVE BLANKS) Short attention span, easy distractibility, inability to concentrate: Present to a slight degree Impulsive, impatient, low tolerance for pain or frustration: Present to a slight degree Uncooperative, resistant to care, demanding: Present to a slight degree Violent and/or threatening violence toward people or property: Absent Explosive and/or unpredictable anger: Absent Rocking, rubbing, moaning, or other self-stimulating behavior: Absent Pulling at tubes, restraints, etc.: Absent Wandering from treatment areas: Absent Restlessness, pacing, excessive movement: Absent Repetitive behaviors, motor, and/or verbal: Absent Rapid, loud, or excessive talking: Absent Sudden changes of mood: Absent Easily initiated or excessive crying and/or laughter: Absent Self-abusiveness, physical and/or verbal: Absent Agitated behavior scale total score: 17  Therapy/Group: Individual Therapy  Alazia Crocket 05/19/2022, 9:39 AM

## 2022-05-19 NOTE — Progress Notes (Signed)
PROGRESS NOTE   Subjective/Complaints:  Pt had a good night. Feels rested this morning. Wants to go outside, get out of room.   ROS: Patient denies fever, rash, sore throat, blurred vision, dizziness, nausea, vomiting, diarrhea, cough, shortness of breath or chest pain, joint or back/neck pain, headache, or mood change.    Objective:   No results found. No results for input(s): "WBC", "HGB", "HCT", "PLT" in the last 72 hours.  Recent Labs    05/18/22 0618 05/19/22 0627  NA 137 136  K 4.2 3.9  CL 102 102  CO2 27 26  GLUCOSE 134* 118*  BUN 27* 28*  CREATININE 1.13 0.92  CALCIUM 11.0* 10.8*    Intake/Output Summary (Last 24 hours) at 05/19/2022 0926 Last data filed at 05/18/2022 1756 Gross per 24 hour  Intake 377 ml  Output --  Net 377 ml        Physical Exam: Vital Signs Blood pressure 112/79, pulse 100, temperature 98.6 F (37 C), temperature source Oral, resp. rate 18, height 5\' 11"  (1.803 m), weight 66.1 kg, SpO2 100 %.  Constitutional: No distress . Vital signs reviewed. HEENT: NCAT, EOMI, oral membranes moist. No vision OD Neck: supple Cardiovascular: RRR without murmur. No JVD    Respiratory/Chest: CTA Bilaterally without wheezes or rales. Normal effort    GI/Abdomen: BS +, non-tender, non-distended Ext: no clubbing, cyanosis, or edema Psych: pleasant and cooperative  Skin: No evidence of breakdown, no evidence of rash. Scarring about abdomen is minimal.  Musculoskeletal:     Cervical back: Neck supple.     Right lower leg: No edema.     Left lower leg: No edema.  Skin:    General: Skin is warm and dry.     G tube site ok, right face/eye scarring present  Neurological:     Comments: oriented to hospital and TBI and self. Demonstrating better insight and awareness.  Speech clear. Improved attention, conversation. Moved all 4 limbs with no visible limitations.     Assessment/Plan: 1. Functional  deficits which require 3+ hours per day of interdisciplinary therapy in a comprehensive inpatient rehab setting. Physiatrist is providing close team supervision and 24 hour management of active medical problems listed below. Physiatrist and rehab team continue to assess barriers to discharge/monitor patient progress toward functional and medical goals  Care Tool:  Bathing  Bathing activity did not occur: Refused           Bathing assist       Upper Body Dressing/Undressing Upper body dressing   What is the patient wearing?: Pull over shirt    Upper body assist Assist Level: Supervision/Verbal cueing    Lower Body Dressing/Undressing Lower body dressing      What is the patient wearing?: Pants     Lower body assist Assist for lower body dressing: Contact Guard/Touching assist     Toileting Toileting    Toileting assist Assist for toileting: Total Assistance - Patient < 25%     Transfers Chair/bed transfer  Transfers assist  Chair/bed transfer activity did not occur: Safety/medical concerns  Chair/bed transfer assist level: Moderate Assistance - Patient 50 - 74%     Locomotion  Ambulation   Ambulation assist      Assist level: 2 helpers Assistive device: Hand held assist Max distance: 15   Walk 10 feet activity   Assist     Assist level: 2 helpers Assistive device: Hand held assist   Walk 50 feet activity   Assist Walk 50 feet with 2 turns activity did not occur: Safety/medical concerns (Behavior)         Walk 150 feet activity   Assist Walk 150 feet activity did not occur: Safety/medical concerns (Behavior)         Walk 10 feet on uneven surface  activity   Assist     Assist level: 2 helpers Assistive device: Hand held assist   Wheelchair     Assist Is the patient using a wheelchair?: No             Wheelchair 50 feet with 2 turns activity    Assist            Wheelchair 150 feet activity      Assist          Blood pressure 112/79, pulse 100, temperature 98.6 F (37 C), temperature source Oral, resp. rate 18, height 5\' 11"  (1.803 m), weight 66.1 kg, SpO2 100 %.  Medical Problem List and Plan: 1. Functional deficits secondary to TBI, polytrauma, intra-abdominal injuries suffered on 03/08/22             -patient may shower             -ELOS/Goals: 18-24 days, supervision to min assist             -RLAS VI  -Continue CIR therapies including PT, OT, and SLP    2.  Antithrombotics: -DVT/anticoagulation:   Mechanical:  Antiembolism stockings, knee (TED hose)  Bilateral lower extremities -dopplers-Neg 8/9             -antiplatelet therapy: none 3. Pain Management: Tylenol as needed 4. Mood/Behavior/Sleep: agitation has improved             -continue Zyprexa 10 mg daily             -Ativan 0.5 mg BID (changed to PRN)             -Haldol 5 mg q 6 hours prn -Neurontin 100 mg BID -Zoloft 25 mg daily             -antipsychotic agents: see above             -continue sleep chart--is sleeping more consistently  -nicotine patch added  8/18 really doing quite well. I'm hesitant to make changes right now 5. Neuropsych/cognition: This patient is not capable of making decisions on his own behalf.             -8/17 ritalin 10mg  bid for initiation---seems to be helping   -continue current dosing 6. Skin/Wound Care: routine skin care checks             -routine PEG site care (placed 7/24) 7. Fluids/Electrolytes/Nutrition: Routine Is and Os and follow-up chemistries             -dysphagia 1 diet with thin liquids being tolerated             -dc bolus feeds, keep h20 flushes             -8/14 further elevation of BUN--increased h2o flushes    -8/18 BUN continues to slowly climb, 28 today. The patient and  I discussed the importance of his fuid intake -will increase fluid boluses for now thru peg 8: Syphilis/neurosyphilis: treated with Rocephin and doxycycline             -  pen G  IV q4 hours completed 8/11 9: HIV positivity: continue Truvada (substituted Descovy) and Tivicay 10: DM-2: continue CBGs, SSI             -Lantus 18 units BID  CBG (last 3)  Recent Labs    05/18/22 1642 05/18/22 2125 05/19/22 0622  GLUCAP 140* 144* 127*    -Increased to 20 units BID improving 8/13  -8/18 fair control 11: Paroxysmal tachycardia/hypertension: continue diltiazem, metoprolol             -heart rate currently controlled             -mag oxide 400 mg daily   Vitals:   05/18/22 2017 05/19/22 0623  BP: 118/79 112/79  Pulse: (!) 107 100  Resp: 16 18  Temp: 99.1 F (37.3 C) 98.6 F (37 C)  SpO2: 99% 100%    12: Urinary retention:  voiding trial, timed voids  -currently incontinent 13: 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 Dysphagia -D1, thins---tolerating well -MBS on 8/11   LOS: 10 days A FACE TO FACE EVALUATION WAS PERFORMED  Ranelle Oyster 05/19/2022, 9:26 AM

## 2022-05-20 DIAGNOSIS — E119 Type 2 diabetes mellitus without complications: Secondary | ICD-10-CM | POA: Diagnosis not present

## 2022-05-20 DIAGNOSIS — R1312 Dysphagia, oropharyngeal phase: Secondary | ICD-10-CM | POA: Diagnosis not present

## 2022-05-20 DIAGNOSIS — Z21 Asymptomatic human immunodeficiency virus [HIV] infection status: Secondary | ICD-10-CM | POA: Diagnosis not present

## 2022-05-20 DIAGNOSIS — S069X9S Unspecified intracranial injury with loss of consciousness of unspecified duration, sequela: Secondary | ICD-10-CM | POA: Diagnosis not present

## 2022-05-20 LAB — GLUCOSE, CAPILLARY
Glucose-Capillary: 129 mg/dL — ABNORMAL HIGH (ref 70–99)
Glucose-Capillary: 90 mg/dL (ref 70–99)
Glucose-Capillary: 114 mg/dL — ABNORMAL HIGH (ref 70–99)
Glucose-Capillary: 114 mg/dL — ABNORMAL HIGH (ref 70–99)

## 2022-05-20 NOTE — Progress Notes (Signed)
PROGRESS NOTE   Subjective/Complaints:  No new issues. Feeling well. Night was uneventful  ROS: Patient denies fever, rash, sore throat, blurred vision, dizziness, nausea, vomiting, diarrhea, cough, shortness of breath or chest pain, joint or back/neck pain, headache, or mood change.    Objective:   No results found. No results for input(s): "WBC", "HGB", "HCT", "PLT" in the last 72 hours.  Recent Labs    05/18/22 0618 05/19/22 0627  NA 137 136  K 4.2 3.9  CL 102 102  CO2 27 26  GLUCOSE 134* 118*  BUN 27* 28*  CREATININE 1.13 0.92  CALCIUM 11.0* 10.8*    Intake/Output Summary (Last 24 hours) at 05/20/2022 1048 Last data filed at 05/20/2022 0834 Gross per 24 hour  Intake 471 ml  Output --  Net 471 ml        Physical Exam: Vital Signs Blood pressure 108/76, pulse 93, temperature 98 F (36.7 C), resp. rate 18, height 5\' 11"  (1.803 m), weight 67.9 kg, SpO2 100 %.  Constitutional: No distress . Vital signs reviewed. HEENT:     oral membranes moist. No vision OD Neck: supple Cardiovascular: RRR without murmur. No JVD    Respiratory/Chest: CTA Bilaterally without wheezes or rales. Normal effort    GI/Abdomen: BS +, non-tender, non-distended Ext: no clubbing, cyanosis, or edema Psych: pleasant and cooperative  Skin: No evidence of breakdown, no evidence of rash. Scarring about abdomen is minimal.  Musculoskeletal:     Cervical back: Neck supple.     Right lower leg: No edema.     Left lower leg: No edema.  Skin:    General: Skin is warm and dry.     G tube site ok, right face/eye scarring present  Neurological:     Comments: oriented to hospital and TBI and self. Demonstrating better insight and awareness.  Speech clear. Improved attention, conversation. Moved all 4 limbs with no visible limitations.     Assessment/Plan: 1. Functional deficits which require 3+ hours per day of interdisciplinary therapy in  a comprehensive inpatient rehab setting. Physiatrist is providing close team supervision and 24 hour management of active medical problems listed below. Physiatrist and rehab team continue to assess barriers to discharge/monitor patient progress toward functional and medical goals  Care Tool:  Bathing  Bathing activity did not occur: Refused           Bathing assist       Upper Body Dressing/Undressing Upper body dressing   What is the patient wearing?: Pull over shirt    Upper body assist Assist Level: Supervision/Verbal cueing    Lower Body Dressing/Undressing Lower body dressing      What is the patient wearing?: Pants     Lower body assist Assist for lower body dressing: Contact Guard/Touching assist     Toileting Toileting    Toileting assist Assist for toileting: Total Assistance - Patient < 25%     Transfers Chair/bed transfer  Transfers assist  Chair/bed transfer activity did not occur: Safety/medical concerns  Chair/bed transfer assist level: Moderate Assistance - Patient 50 - 74%     Locomotion Ambulation   Ambulation assist      Assist  level: 2 helpers Assistive device: Hand held assist Max distance: 15   Walk 10 feet activity   Assist     Assist level: 2 helpers Assistive device: Hand held assist   Walk 50 feet activity   Assist Walk 50 feet with 2 turns activity did not occur: Safety/medical concerns (Behavior)         Walk 150 feet activity   Assist Walk 150 feet activity did not occur: Safety/medical concerns (Behavior)         Walk 10 feet on uneven surface  activity   Assist     Assist level: 2 helpers Assistive device: Hand held assist   Wheelchair     Assist Is the patient using a wheelchair?: No             Wheelchair 50 feet with 2 turns activity    Assist            Wheelchair 150 feet activity     Assist          Blood pressure 108/76, pulse 93, temperature 98 F  (36.7 C), resp. rate 18, height 5\' 11"  (1.803 m), weight 67.9 kg, SpO2 100 %.  Medical Problem List and Plan: 1. Functional deficits secondary to TBI, polytrauma, intra-abdominal injuries suffered on 03/08/22             -patient may shower             -ELOS/Goals: 18-24 days, supervision to min assist             -RLAS VI  -Continue CIR therapies including PT, OT, and SLP  2.  Antithrombotics: -DVT/anticoagulation:   Mechanical:  Antiembolism stockings, knee (TED hose)  Bilateral lower extremities -dopplers-Neg 8/9             -antiplatelet therapy: none 3. Pain Management: Tylenol as needed 4. Mood/Behavior/Sleep: agitation has improved             -continue Zyprexa 10 mg daily             -Ativan 0.5 mg BID (changed to PRN)             -Haldol 5 mg q 6 hours prn -Neurontin 100 mg BID -Zoloft 25 mg daily             -antipsychotic agents: see above             -continue sleep chart--is sleeping more consistently  -nicotine patch added  8/19 really doing quite well. I'm hesitant to make changes right now   -might be able to remove gabapentin 5. Neuropsych/cognition: This patient is not capable of making decisions on his own behalf.             -8/17 ritalin 10mg  bid for initiation---seems to be helping   -continue current dosing 6. Skin/Wound Care: routine skin care checks             -routine PEG site care (placed 7/24) 7. Fluids/Electrolytes/Nutrition: Routine Is and Os and follow-up chemistries             -dysphagia 1 diet with thin liquids being tolerated             -dc bolus feeds, keep h20 flushes             -8/14 further elevation of BUN--increased h2o flushes    -8/18 BUN continues to slowly climb, 28 today. The patient and I discussed the importance of his fuid intake -  increased fluid boluses for now thru peg -check bmet 8/20 8: Syphilis/neurosyphilis: treated with Rocephin and doxycycline             -  pen G IV q4 hours completed 8/11 9: HIV positivity: continue  Truvada (substituted Descovy) and Tivicay 10: DM-2: continue CBGs, SSI             -Lantus 18 units BID  CBG (last 3)  Recent Labs    05/19/22 1626 05/19/22 2050 05/20/22 0609  GLUCAP 106* 158* 129*    -Increased to 20 units BID improving 8/13  -8/19 fair control 11: Paroxysmal tachycardia/hypertension: continue diltiazem, metoprolol             -heart rate currently controlled             -mag oxide 400 mg daily   Vitals:   05/19/22 1924 05/20/22 0339  BP: 131/89 108/76  Pulse: 99 93  Resp: 15 18  Temp: 98.7 F (37.1 C) 98 F (36.7 C)  SpO2: 100% 100%    12: Urinary retention:  voiding trial, timed voids  -currently incontinent 13: 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 Dysphagia -D1, thins---tolerating well -MBS on 8/11   LOS: 11 days A FACE TO FACE EVALUATION WAS PERFORMED  Ranelle Oyster 05/20/2022, 10:48 AM

## 2022-05-20 NOTE — Progress Notes (Signed)
Physical Therapy Session Note  Patient Details  Name: Zachary Hill MRN: [id number] Date of Birth: 1981/01/06  Today's Date: 05/20/2022 PT Individual Time: 7169-6789 PT Individual Time Calculation (min): 45 min   Short Term Goals: Week 2:  PT Short Term Goal 1 (Week 2): Patient will perform basic transfers with CGA consistently using LRAD. PT Short Term Goal 2 (Week 2): Patient will ambulate >100 feet using LRAD with min A. PT Short Term Goal 3 (Week 2): Patient will sit OOB >1 hour between therapy sessions. PT Short Term Goal 4 (Week 2): Patient will perform 4 steps with L rail per home set-up with min A.  Skilled Therapeutic Interventions/Progress Updates:    Chart reviewed and pt agreeable to therapy. Pt received semi-reclined in bed with no c/o pain. Also of note, pt able to confirm location, but unable to give correct month or day. Session focused on functional transfers and safe ambulation to promote safe home access. Pt initiated session with transfer to EOB using ModI. Pt then completed sit to stand + 26ft amb to WC using CGA + RW. Pt transported to therapy gym for time conservation. In therapy gym, pt instructed to practice safety judgement with ambulation distance. Pt tasked with walking as far as possible and returning to [facility] without reaching maximal exertion. Through 4 rounds, pt demonstrated ability to amb 50 ft + 75 ft + 50 ft + 50 ft before needing rest break. Of note, pt also displays significant forward trunk flexion 2/2 abdominal incision. Pt very receptive to verbal cuing for slow upright standing. Pt then transferred back to room. In room, pt practiced static stand with no hand hold for 3x30 secs with CGA. ABS measured and documented in flowsheet. At end of session, pt was left seated in recliner with alarm engaged to promote OOB goal, nurse call bell and all needs in reach.     Therapy Documentation Precautions:  Precautions Precautions: Fall, Other (comment) Precaution  Comments: PEG (with abdominal binder covering), unable to fully open R eye, fear/behavioral, impulsive, poor safety awareness Restrictions Weight Bearing Restrictions: No RUE Weight Bearing: Non weight bearing RLE Weight Bearing: Touchdown weight bearing LLE Weight Bearing: Touchdown weight bearing   Therapy/Group: Individual Therapy  Dionne Milo, PT, DPT 05/20/2022, 12:31 PM

## 2022-05-20 NOTE — Progress Notes (Signed)
Physical Therapy Session Note  Patient Details  Name: Zachary Hill MRN: [id number] Date of Birth: July 12, 1981  Today's Date: 05/20/2022 PT Individual Time: 1100-1153 PT Individual Time Calculation (min): 53 min   Short Term Goals: Week 1:  PT Short Term Goal 1 (Week 1): Patient will perform stand pivot transfer with LRAD and minimal assistance PT Short Term Goal 1 - Progress (Week 1): Met PT Short Term Goal 2 (Week 1): Patient will ambulate x50' with LRAD and moderate assistance x1 PT Short Term Goal 2 - Progress (Week 1): Met PT Short Term Goal 3 (Week 1): Patient will ascend/descend x4 steps with L HR and minimal assistance PT Short Term Goal 3 - Progress (Week 1): Progressing toward goal Week 2:  PT Short Term Goal 1 (Week 2): Patient will perform basic transfers with CGA consistently using LRAD. PT Short Term Goal 2 (Week 2): Patient will ambulate >100 feet using LRAD with min A. PT Short Term Goal 3 (Week 2): Patient will sit OOB >1 hour between therapy sessions. PT Short Term Goal 4 (Week 2): Patient will perform 4 steps with L rail per home set-up with min A. Week 3:     Skilled Therapeutic Interventions/Progress Updates:   Pt received supine in bed and agreeable to PT. Supine>sit transfer with superivsion assist and cues for initiation of movement. Donning shoes EOB with set up assist only from PT. Tranfers including sit<>stand and stand puvot to various surfaces throughout session performed with supervision assist and cues for safe AAD positioning to reduce fall risk.. Gait training in room  to locate shirt and jacket with RW. Donnign shirt in room with supervision assist ing standing with no AD. Pt transported to entrance of hospital. Gait training performed with RW x 74f, [id number] cement sidewalk at entrance. Cues for posture and AD management around people and obstacles but supervision assist only required from PT with no overt LOB.    Pt returned to room and  performed ambulatory transfer to bed with RW and supervision assist. Sit>supine completed with supervision Assist for LLE positioning in bed to prevent hitting knee on bed rail. Pt left supine in bed with call bell in reach, all 4 rails up per request, and all needs met.       Therapy Documentation Precautions:  Precautions Precautions: Fall, Other (comment) Precaution Comments: PEG (with abdominal binder covering), unable to fully open R eye, fear/behavioral, impulsive, poor safety awareness Restrictions Weight Bearing Restrictions: No RUE Weight Bearing: Non weight bearing    Vital Signs: Therapy Vitals Temp: 98 F (36.7 C) Temp Source: Oral Pulse Rate: 96 Resp: 17 BP: 108/76 Patient Position (if appropriate): Sitting Oxygen Therapy SpO2: 100 % O2 Device: Room Air Pain:  denies    Therapy/Group: Individual Therapy  AustLorie Phenix9/2023, 5:23 PM

## 2022-05-20 NOTE — Progress Notes (Signed)
Occupational Therapy TBI Note  Patient Details  Name: Zachary Hill MRN: [id number] Date of Birth: 03/29/1981  Today's Date: 05/20/2022 OT Individual Time: 1300-1345 OT Individual Time Calculation (min): 45 min    Short Term Goals: Week 1:  OT Short Term Goal 1 (Week 1): Pt will terminate oral care wiht no more than 2 cues OT Short Term Goal 1 - Progress (Week 1): Progressing toward goal OT Short Term Goal 2 (Week 1): Pt will appropriately use ADL items with no cuing to demo improved ideation OT Short Term Goal 2 - Progress (Week 1): Met OT Short Term Goal 3 (Week 1): Pt will complete functional trnasfers wiht CGA OT Short Term Goal 3 - Progress (Week 1): Met OT Short Term Goal 4 (Week 1): Pt will sequence bathing body parts with MOD cuing OT Short Term Goal 4 - Progress (Week 1): Met OT Short Term Goal 5 (Week 1): Pt will don shirt with supervsion OT Short Term Goal 5 - Progress (Week 1): Met  Skilled Therapeutic Interventions/Progress Updates:     Pt received in be. Provided 5 min "warning" prior to session that pt would be expected to get up to improve pts initiation and awareness around mobility.   Therapeutic activity Pt completes all mobility with supervision and cuing for improved posture. Pt moderately responsive to cues this date Pt with no recall of events of previous sessions and shallow generalized responses when asked.  BITS seated memory recall-immediate: up to 5 words with intermittent errors 60% of time with sustained attention for >1 min  Visual memory up to level 3 then pt able to recal general shape on geoboard but unable to execute exact details.   Pt completes bean bag toss in standing position with RW AD for balance and CGA-S A overall. Activity performed to improve dynamic balance and functional reach in mod ranges outside BOS in prep for BADLs/IADLs. Attempted to have pt keep score with MOD A overall for recalling/accurately calculating 3 rounds of score  simplified with 1 point per bean bag. Pt able to calculate 2/3 rounds    Pt left at end of session in recliner with exit alarm on, call light in reach and all needs met   Therapy Documentation Precautions:  Precautions Precautions: Fall, Other (comment) Precaution Comments: PEG (with abdominal binder covering), unable to fully open R eye, fear/behavioral, impulsive, poor safety awareness Restrictions Weight Bearing Restrictions: No RUE Weight Bearing: Non weight bearing RLE Weight Bearing: Touchdown weight bearing LLE Weight Bearing: Touchdown weight bearing General:    Agitated Behavior Scale:  TBI Observation Details Observation Environment: [facility] Start of observation period - Date: 05/20/22 Start of observation period - Time: 1300 End of observation period - Date: 05/20/22 End of observation period - Time: 1345 Agitated Behavior Scale (DO NOT LEAVE BLANKS) Short attention span, easy distractibility, inability to concentrate: Present to a slight degree Impulsive, impatient, low tolerance for pain or frustration: Absent Uncooperative, resistant to care, demanding: Absent Violent and/or threatening violence toward people or property: Absent Explosive and/or unpredictable anger: Absent Rocking, rubbing, moaning, or other self-stimulating behavior: Absent Pulling at tubes, restraints, etc.: Absent Wandering from treatment areas: Absent Restlessness, pacing, excessive movement: Absent Repetitive behaviors, motor, and/or verbal: Absent Rapid, loud, or excessive talking: Absent Sudden changes of mood: Absent Easily initiated or excessive crying and/or laughter: Absent Self-abusiveness, physical and/or verbal: Absent Agitated behavior scale total score: 15  Therapy/Group: Individual Therapy   M  05/20/2022, 2:16 PM 

## 2022-05-21 LAB — GLUCOSE, CAPILLARY
Glucose-Capillary: 106 mg/dL — ABNORMAL HIGH (ref 70–99)
Glucose-Capillary: 86 mg/dL (ref 70–99)
Glucose-Capillary: 137 mg/dL — ABNORMAL HIGH (ref 70–99)
Glucose-Capillary: 112 mg/dL — ABNORMAL HIGH (ref 70–99)

## 2022-05-21 LAB — BASIC METABOLIC PANEL
Anion gap: 9 (ref 5–15)
BUN: 24 mg/dL — ABNORMAL HIGH (ref 6–20)
CO2: 25 mmol/L (ref 22–32)
Calcium: 10.7 mg/dL — ABNORMAL HIGH (ref 8.9–10.3)
Chloride: 101 mmol/L (ref 98–111)
Creatinine, Ser: 0.94 mg/dL (ref 0.61–1.24)
GFR, Estimated: >60 mL/min (ref >60)
Glucose, Bld: 109 mg/dL — ABNORMAL HIGH (ref 70–99)
Potassium: 3.9 mmol/L (ref 3.5–5.1)
Sodium: 135 mmol/L (ref 135–145)

## 2022-05-21 NOTE — Progress Notes (Signed)
Speech Language Pathology Daily Session Note  Patient Details  Name: Zachary Hill MRN: [id number] Date of Birth: 07/15/1981  Today's Date: 05/21/2022 SLP Individual Time: 1345-1425 SLP Individual Time Calculation (min): 40 min  Short Term Goals: Week 2: SLP Short Term Goal 1 (Week 2): Patient will demonstrate sustained attention to functional tasks for 20 minutes with Max verbal cues for redirection. SLP Short Term Goal 2 (Week 2): Patient will demonstrate functional problem solving for basic and familiar tasks with Mod A multimodal cues. SLP Short Term Goal 3 (Week 2): Patient will utilize external memory aids for orientation to place, time, and situation with Min verbal and visual cues. SLP Short Term Goal 4 (Week 2): Patient will consume current diet with minimal overt s/s of aspiration with Mod verbal cues for use of swallowing compensatory strategies. SLP Short Term Goal 5 (Week 2): Patient will demonstrate efficient mastication with complete oral clearance without overt s/s of aspiration with Dys. 2 texture trials over 3 sessions prior to upgrade.  Skilled Therapeutic Interventions:  Pt was seen for skilled ST targeting cognitive and dysphagia goals.  Upon arrival, pt was resting in bed but awakened easily to voice and was agreeable to participating in treatment.  Pt was oriented to exact date, place, and situation with mod cues to use external orientation aids posted in room.  During functional conversations with SLP, pt was noted to be confabulatory at times (ie, pointing to the pictures of the CIR doctors posted in his room stating that it was a picture of him) but he could be redirected to more coherent topics of conversation for brief periods.  SLP also facilitated the session with a functional snack of dys 1 textures and thin liquids to monitor toleration of currently prescribed diet.  No overt s/s of aspiration were evident with purees or thin liquids.  Recommend that pt remain on  his currently prescribed diet.  Pt was left in bed with bed alarm set and call bell within reach.  Continue per current plan of care.    Pain Pain Assessment Pain Scale: 0-10 Pain Score: 0-No pain  Therapy/Group: Individual Therapy  Quaneshia Wareing, Melanee Spry 05/21/2022, 2:19 PM

## 2022-05-22 DIAGNOSIS — S069X3S Unspecified intracranial injury with loss of consciousness of 1 hour to 5 hours 59 minutes, sequela: Secondary | ICD-10-CM | POA: Diagnosis not present

## 2022-05-22 LAB — CBC
HCT: 32 % — ABNORMAL LOW (ref 39.0–52.0)
Hemoglobin: 10.3 g/dL — ABNORMAL LOW (ref 13.0–17.0)
MCH: 26.5 pg (ref 26.0–34.0)
MCHC: 32.2 g/dL (ref 30.0–36.0)
MCV: 82.5 fL (ref 80.0–100.0)
Platelets: 253 10*3/uL (ref 150–400)
RBC: 3.88 MIL/uL — ABNORMAL LOW (ref 4.22–5.81)
RDW: 16.8 % — ABNORMAL HIGH (ref 11.5–15.5)
WBC: 5.9 10*3/uL (ref 4.0–10.5)
nRBC: 0 % (ref 0.0–0.2)

## 2022-05-22 LAB — BASIC METABOLIC PANEL
Anion gap: 8 (ref 5–15)
BUN: 22 mg/dL — ABNORMAL HIGH (ref 6–20)
CO2: 25 mmol/L (ref 22–32)
Calcium: 10.8 mg/dL — ABNORMAL HIGH (ref 8.9–10.3)
Chloride: 100 mmol/L (ref 98–111)
Creatinine, Ser: 0.96 mg/dL (ref 0.61–1.24)
GFR, Estimated: >60 mL/min (ref >60)
Glucose, Bld: 114 mg/dL — ABNORMAL HIGH (ref 70–99)
Potassium: 3.8 mmol/L (ref 3.5–5.1)
Sodium: 133 mmol/L — ABNORMAL LOW (ref 135–145)

## 2022-05-22 LAB — GLUCOSE, CAPILLARY
Glucose-Capillary: 108 mg/dL — ABNORMAL HIGH (ref 70–99)
Glucose-Capillary: 101 mg/dL — ABNORMAL HIGH (ref 70–99)
Glucose-Capillary: 102 mg/dL — ABNORMAL HIGH (ref 70–99)
Glucose-Capillary: 130 mg/dL — ABNORMAL HIGH (ref 70–99)

## 2022-05-22 NOTE — Progress Notes (Addendum)
Occupational Therapy TBI Note  Patient Details  Name: Joanthan Hlavacek MRN: [id number] Date of Birth: Feb 29, 1981  Today's Date: 05/22/2022 OT Individual Time: 1407-1505 OT Individual Time Calculation (min): 58 min    Short Term Goals: Week 2:  OT Short Term Goal 1 (Week 2): Pt will appropriately terminate grooming tasks at sink wiht no more than 2 cues OT Short Term Goal 2 (Week 2): Pt will initate mobility throughout session wiht no more than MOD cuing OT Short Term Goal 3 (Week 2): Pt will request use of shower chiar to demo improved awareness into deficits OT Short Term Goal 4 (Week 2): Pt will complete dressing in <10 min to demo improved initiation  Skilled Therapeutic Interventions/Progress Updates:    Pt greeted semi-reclined in bed and agreeable to OT treatment session. Pt agreeable to shower this afternoon. Practiced functional ambulation to bathroom without AD. Pt needed min A and cues not to furniture walk as he was grabbing for doorway and bed rail. OT offered pt's RW to the rest of the way in bathroom and was then supervision with ambulation. He needed min cues throughout session for safety and for task termination, but overall much improved safety awareness. Dressing tasks completed from EOB with overall supervision with sit<>stands and increased time to initiate due to intermittent internal and external distractions. Pt completed stand-pivot to recliner with close supervision. Pt left seated in recliner with alarm belt on, call bell in reach and needs met.   Therapy Documentation Precautions:  Precautions Precautions: Fall, Other (comment) Precaution Comments: PEG (with abdominal binder covering), unable to fully open R eye, fear/behavioral, impulsive, poor safety awareness Restrictions Weight Bearing Restrictions: No RUE Weight Bearing: Non weight bearing RLE Weight Bearing: Touchdown weight bearing LLE Weight Bearing: Touchdown weight bearing Pain: Pain Assessment Pain  Score: 0-No pain Faces Pain Scale: Hurts little more Pain Type: Acute pain Pain Location: Head Pain Descriptors / Indicators: Aching Pain Onset: On-going Pain Intervention(s): RN made aware Agitated Behavior Scale: TBI Observation Details Observation Environment: [facility] Start of observation period - Date: 05/22/22 Start of observation period - Time: 1400 End of observation period - Date: 05/22/22 End of observation period - Time: 1500 Agitated Behavior Scale (DO NOT LEAVE BLANKS) Short attention span, easy distractibility, inability to concentrate: Absent Impulsive, impatient, low tolerance for pain or frustration: Absent Uncooperative, resistant to care, demanding: Absent Violent and/or threatening violence toward people or property: Absent Explosive and/or unpredictable anger: Absent Rocking, rubbing, moaning, or other self-stimulating behavior: Absent Pulling at tubes, restraints, etc.: Absent Wandering from treatment areas: Absent Restlessness, pacing, excessive movement: Absent Repetitive behaviors, motor, and/or verbal: Absent Rapid, loud, or excessive talking: Absent Sudden changes of mood: Absent Easily initiated or excessive crying and/or laughter: Absent Self-abusiveness, physical and/or verbal: Absent Agitated behavior scale total score: 14   Therapy/Group: Individual Therapy  Valma Cava 05/22/2022, 3:40 PM

## 2022-05-22 NOTE — Progress Notes (Signed)
Speech Language Pathology Daily Session Note  Patient Details  Name: Zachary Hill MRN: [id number] Date of Birth: 11-01-1980  Today's Date: 05/22/2022 SLP Individual Time: 1010-1110 SLP Individual Time Calculation (min): 60 min  Short Term Goals: Week 2: SLP Short Term Goal 1 (Week 2): Patient will demonstrate sustained attention to functional tasks for 20 minutes with Max verbal cues for redirection. SLP Short Term Goal 2 (Week 2): Patient will demonstrate functional problem solving for basic and familiar tasks with Mod A multimodal cues. SLP Short Term Goal 3 (Week 2): Patient will utilize external memory aids for orientation to place, time, and situation with Min verbal and visual cues. SLP Short Term Goal 4 (Week 2): Patient will consume current diet with minimal overt s/s of aspiration with Mod verbal cues for use of swallowing compensatory strategies. SLP Short Term Goal 5 (Week 2): Patient will demonstrate efficient mastication with complete oral clearance without overt s/s of aspiration with Dys. 2 texture trials over 3 sessions prior to upgrade.  Skilled Therapeutic Interventions:Skilled ST services focused on swallow and cognitive skills. SLP facilitated orientation to place/time/situation. Pt was only orientated to month and year. Pt mod I referred to visual aids posted on wall, but supported unable to read them from far away due to visual deficits. SLP brought visual aids closer and pt was then orientated to place and situation mod I use of aids and min A verbal cues for date/DOW. SLP facilitated recall of orientation information with spaced retrieval tasks, pt demonstrated ability to recall in 5-10 minute intervals fading to mod I. SLP also facilitated sustained attention, basic problem solving and recall in novel card task, pt required min A fade to supervision A verbal cues. Pt consumed pureed texture and thin liquids via cup snack, exhibiting only a mild oral holding with remainder  of swallow function appearing WFL. Pt was left in room with call bell within reach and bed alarm set. SLP recommends to continue skilled services.     Pain Pain Assessment Pain Scale: 0-10 Pain Score: 0-No pain Faces Pain Scale: Hurts little more Pain Type: Acute pain Pain Location: Head Pain Descriptors / Indicators: Aching Pain Onset: On-going Pain Intervention(s): RN made aware  Therapy/Group: Individual Therapy  Haidee Stogsdill  [facility] 05/22/2022, 11:59 AM
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
## 2022-05-23 DIAGNOSIS — S069X9S Unspecified intracranial injury with loss of consciousness of unspecified duration, sequela: Secondary | ICD-10-CM | POA: Diagnosis not present

## 2022-05-23 DIAGNOSIS — R1312 Dysphagia, oropharyngeal phase: Secondary | ICD-10-CM | POA: Diagnosis not present

## 2022-05-23 DIAGNOSIS — Z21 Asymptomatic human immunodeficiency virus [HIV] infection status: Secondary | ICD-10-CM | POA: Diagnosis not present

## 2022-05-23 DIAGNOSIS — E119 Type 2 diabetes mellitus without complications: Secondary | ICD-10-CM | POA: Diagnosis not present

## 2022-05-23 LAB — GLUCOSE, CAPILLARY
Glucose-Capillary: 106 mg/dL — ABNORMAL HIGH (ref 70–99)
Glucose-Capillary: 119 mg/dL — ABNORMAL HIGH (ref 70–99)
Glucose-Capillary: 112 mg/dL — ABNORMAL HIGH (ref 70–99)
Glucose-Capillary: 161 mg/dL — ABNORMAL HIGH (ref 70–99)

## 2022-05-23 MED ORDER — METHOCARBAMOL 500 MG PO TABS
500.0000 mg | ORAL_TABLET | Freq: Four times a day (QID) | ORAL | Status: DC | PRN
  Administered 2022-05-28: 500 mg via ORAL
  Filled 2022-05-23: qty 1

## 2022-05-23 MED ORDER — FAMOTIDINE 20 MG PO TABS
20.0000 mg | ORAL_TABLET | Freq: Every day | ORAL | Status: DC
  Administered 2022-05-24 – 2022-05-31 (×8): 20 mg via ORAL
  Filled 2022-05-23 (×8): qty 1

## 2022-05-23 MED ORDER — DOLUTEGRAVIR SODIUM 50 MG PO TABS
50.0000 mg | ORAL_TABLET | Freq: Every day | ORAL | Status: DC
  Administered 2022-05-24 – 2022-05-31 (×8): 50 mg via ORAL
  Filled 2022-05-23 (×8): qty 1

## 2022-05-23 MED ORDER — ATORVASTATIN CALCIUM 40 MG PO TABS
40.0000 mg | ORAL_TABLET | Freq: Every day | ORAL | Status: DC
Start: 2022-05-23 — End: 2022-05-31
  Administered 2022-05-23 – 2022-05-30 (×7): 40 mg via ORAL
  Filled 2022-05-23 (×9): qty 1

## 2022-05-23 MED ORDER — MAGNESIUM OXIDE -MG SUPPLEMENT 400 (240 MG) MG PO TABS
400.0000 mg | ORAL_TABLET | Freq: Every day | ORAL | Status: DC
  Administered 2022-05-23 – 2022-05-30 (×8): 400 mg via ORAL
  Filled 2022-05-23 (×8): qty 1

## 2022-05-23 MED ORDER — EMTRICITABINE-TENOFOVIR AF 200-25 MG PO TABS
1.0000 | ORAL_TABLET | Freq: Every day | ORAL | Status: DC
  Administered 2022-05-24 – 2022-05-31 (×8): 1 via ORAL
  Filled 2022-05-23 (×8): qty 1

## 2022-05-23 MED ORDER — OLANZAPINE 10 MG PO TABS
10.0000 mg | ORAL_TABLET | Freq: Every day | ORAL | Status: DC
Start: 2022-05-23 — End: 2022-05-31
  Administered 2022-05-23 – 2022-05-30 (×7): 10 mg via ORAL
  Filled 2022-05-23 (×10): qty 1

## 2022-05-23 MED ORDER — METOPROLOL TARTRATE 25 MG PO TABS
25.0000 mg | ORAL_TABLET | Freq: Two times a day (BID) | ORAL | Status: DC
Start: 2022-05-23 — End: 2022-05-31
  Administered 2022-05-23 – 2022-05-31 (×15): 25 mg via ORAL
  Filled 2022-05-23 (×17): qty 1

## 2022-05-23 MED ORDER — TRAZODONE HCL 50 MG PO TABS
25.0000 mg | ORAL_TABLET | Freq: Every evening | ORAL | Status: DC | PRN
  Administered 2022-05-23: 50 mg via ORAL
  Filled 2022-05-23: qty 1

## 2022-05-23 MED ORDER — GUAIFENESIN-DM 100-10 MG/5ML PO SYRP: 5.0000 mL | ORAL_SOLUTION | Freq: Four times a day (QID) | ORAL | Status: DC | PRN

## 2022-05-23 MED ORDER — SERTRALINE HCL 20 MG/ML PO CONC
25.0000 mg | Freq: Every day | ORAL | Status: DC
Start: 2022-05-24 — End: 2022-05-31
  Administered 2022-05-24 – 2022-05-31 (×8): 25 mg via ORAL
  Filled 2022-05-23 (×8): qty 1.25

## 2022-05-23 MED ORDER — METHYLPHENIDATE HCL 5 MG PO TABS
10.0000 mg | ORAL_TABLET | Freq: Two times a day (BID) | ORAL | Status: DC
  Administered 2022-05-23 – 2022-05-31 (×15): 10 mg via ORAL
  Filled 2022-05-23 (×15): qty 2

## 2022-05-23 MED ORDER — MAGNESIUM HYDROXIDE 400 MG/5ML PO SUSP: 30.0000 mL | Freq: Every day | ORAL | Status: DC | PRN

## 2022-05-23 MED ORDER — LORAZEPAM 0.5 MG PO TABS
0.5000 mg | ORAL_TABLET | Freq: Two times a day (BID) | ORAL | Status: DC | PRN
  Administered 2022-05-26 – 2022-05-27 (×2): 0.5 mg via ORAL
  Filled 2022-05-23 (×2): qty 1

## 2022-05-23 MED ORDER — POLYETHYLENE GLYCOL 3350 17 G PO PACK
17.0000 g | PACK | Freq: Every day | ORAL | Status: DC
  Administered 2022-05-24 – 2022-05-31 (×5): 17 g via ORAL
  Filled 2022-05-23 (×8): qty 1

## 2022-05-23 MED ORDER — FOLIC ACID 1 MG PO TABS
1.0000 mg | ORAL_TABLET | Freq: Every day | ORAL | Status: DC
  Administered 2022-05-24 – 2022-05-31 (×8): 1 mg via ORAL
  Filled 2022-05-23 (×8): qty 1
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
## 2022-05-23 NOTE — Progress Notes (Addendum)
Speech Language Pathology TBI Note  Patient Details  Name: Zachary Hill MRN: [id number] Date of Birth: 06/28/1981  Today's Date: 05/23/2022 SLP Individual Time: 5329-9242 SLP Individual Time Calculation (min): 60 min  Short Term Goals: Week 2: SLP Short Term Goal 1 (Week 2): Patient will demonstrate sustained attention to functional tasks for 20 minutes with Max verbal cues for redirection. SLP Short Term Goal 2 (Week 2): Patient will demonstrate functional problem solving for basic and familiar tasks with Mod A multimodal cues. SLP Short Term Goal 3 (Week 2): Patient will utilize external memory aids for orientation to place, time, and situation with Min verbal and visual cues. SLP Short Term Goal 4 (Week 2): Patient will consume current diet with minimal overt s/s of aspiration with Mod verbal cues for use of swallowing compensatory strategies. SLP Short Term Goal 5 (Week 2): Patient will demonstrate efficient mastication with complete oral clearance without overt s/s of aspiration with Dys. 2 texture trials over 3 sessions prior to upgrade.  Skilled Therapeutic Interventions: Skilled ST services focused on swallow and cognitive skills. Pt was orientated to place, situation, month, DOW and year, improvement from yesterday's session. SLP facilitated PO intake of breakfast tray dys 1 textures and thine liquids via cup. Pt demonstrated ability to self fed with min A verbal cues for problem solving, only utilizing right hand (due to cognition deficits verse physical) therefore had difficulty opening items. SLP also facilitated snack trials of dys 2 and dys 3 textures, pt demonstrated only mild oral holding on both textures due to attention deficits but the remainder of swallow function appeared [facility]. SLP recommends trial tray of advanced solids next session prior to upgrade to assess fatigue and cognition factors. Pt participated in ALFA money management task. Pt initially required mod A verbal cues  for problem solving/error awareness in counting/displaying requested change however after instruction for use of written aid faded to supervision A verbal cues. Pt was able to add simple change with written aid but required max A verbal cues for subtracting and multiplying with littler awareness of errors. SLP recorded events in memory notebook, pt required min A verbal cues to recall session events. Pt was left in room with call bell within reach and bed alarm set. SLP recommends to continue skilled services.      Pain Pain Assessment Pain Scale: 0-10 Pain Score: 0-No pain  Agitated Behavior Scale: TBI Observation Details Observation Environment: CIR Start of observation period - Date: 05/23/22 Start of observation period - Time: 0817 End of observation period - Date: 05/23/22 End of observation period - Time: 0917 Agitated Behavior Scale (DO NOT LEAVE BLANKS) Short attention span, easy distractibility, inability to concentrate: Present to a slight degree Impulsive, impatient, low tolerance for pain or frustration: Absent Uncooperative, resistant to care, demanding: Absent Violent and/or threatening violence toward people or property: Absent Explosive and/or unpredictable anger: Absent Rocking, rubbing, moaning, or other self-stimulating behavior: Absent Pulling at tubes, restraints, etc.: Absent Wandering from treatment areas: Absent Restlessness, pacing, excessive movement: Absent Repetitive behaviors, motor, and/or verbal: Absent Rapid, loud, or excessive talking: Absent Sudden changes of mood: Absent Easily initiated or excessive crying and/or laughter: Absent Self-abusiveness, physical and/or verbal: Absent Agitated behavior scale total score: 15  Therapy/Group: Individual Therapy  Demarus Latterell  [facility] 05/23/2022, 9:46 AM

## 2022-05-23 NOTE — Progress Notes (Signed)
Occupational Therapy TBI Note  Patient Details  Name: Zachary Hill MRN: [id number] Date of Birth: 1981-04-03  Today's Date: 05/23/2022 OT Individual Time: 1135-1200 OT Individual Time Calculation (min): 25 min   Today's Date: 05/23/2022 OT Individual Time: 1135-1200 OT Individual Time Calculation (min): 25 min   Short Term Goals: Week 1:  OT Short Term Goal 1 (Week 1): Pt will terminate oral care wiht no more than 2 cues OT Short Term Goal 1 - Progress (Week 1): Progressing toward goal OT Short Term Goal 2 (Week 1): Pt will appropriately use ADL items with no cuing to demo improved ideation OT Short Term Goal 2 - Progress (Week 1): Met OT Short Term Goal 3 (Week 1): Pt will complete functional trnasfers wiht CGA OT Short Term Goal 3 - Progress (Week 1): Met OT Short Term Goal 4 (Week 1): Pt will sequence bathing body parts with MOD cuing OT Short Term Goal 4 - Progress (Week 1): Met OT Short Term Goal 5 (Week 1): Pt will don shirt with supervsion OT Short Term Goal 5 - Progress (Week 1): Met  Skilled Therapeutic Interventions/Progress Updates:     Pt received in bed with low back pain. Shower provided for pain relief.  ADL: Pt completes ADL at overall supervision-min cuing Level. Skilled interventions include: cuing for pacing/initiation of shower/mobility, cuing for safety with RW management, improved termination/sequencing of of tasks. Pt able to state that his memory is "generalized" and needs the notebook to improve true recall. Pt still confused thinking this therapist when to high school with pt. Pt also able to verbalize that he has trouble keeping track of days. Pt with continent bladder void in shower. Left EOB with LPN in room with pt finishing lotion and donning gown  Pt left at end of session in bed with exit alarm on, call light in reach and all needs met  Session 2: Pt received in standing with telesitter going off. Pt completes mobility into dayroom with S and  intermittent cuing for posture. Pt completes connect 4 in sitting for 3 rounds with mod cuing for attention to whole board with 50% success for pt to slow down and strategize. Pt able to reach to floor to pick up pieces after each game. Pt assists with cleaning pieces with instruction of using gloves. Pt ambulates back with room with min cuing to locate room. Exited session with pt seated in recliner, exit alarm on and call light in reach    Therapy Documentation Precautions:  Precautions Precautions: Fall, Other (comment) Precaution Comments: PEG (with abdominal binder covering), unable to fully open R eye, fear/behavioral, impulsive, poor safety awareness Restrictions Weight Bearing Restrictions: No RUE Weight Bearing: Non weight bearing RLE Weight Bearing: Touchdown weight bearing LLE Weight Bearing: Touchdown weight bearing General:   Vital Signs:   Pain: Pain Assessment Pain Scale: 0-10 Pain Score: 0-No pain Agitated Behavior Scale: TBI  Observation Details Observation Environment: CIR Start of observation period - Date: 05/23/22 Start of observation period - Time: 0920 End of observation period - Date: 05/23/22 End of observation period - Time: 1000 Agitated Behavior Scale (DO NOT LEAVE BLANKS) Short attention span, easy distractibility, inability to concentrate: Present to a slight degree Impulsive, impatient, low tolerance for pain or frustration: Absent Uncooperative, resistant to care, demanding: Absent Violent and/or threatening violence toward people or property: Absent Explosive and/or unpredictable anger: Absent Rocking, rubbing, moaning, or other self-stimulating behavior: Absent Pulling at tubes, restraints, etc.: Absent Wandering from treatment areas: Absent Restlessness,  pacing, excessive movement: Absent Repetitive behaviors, motor, and/or verbal: Absent Rapid, loud, or excessive talking: Absent Sudden changes of mood: Absent Easily initiated or excessive  crying and/or laughter: Absent Self-abusiveness, physical and/or verbal: Absent Agitated behavior scale total score: 15  ADL:   Vision   Perception    Praxis   Exercises:   Other Treatments:     Therapy/Group: Individual Therapy  Tonny Branch 05/23/2022, 12:30 PM
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
## 2022-05-23 NOTE — Progress Notes (Signed)
Patient ID: Zachary Hill, male   DOB: 1981/08/27, 41 y.o.   MRN: [id number]  SW received message from pt fiance Rosana Fret stating he can only do conference call on Thursday for family meeting and will arrange another day for family edu.   *SW spoke with Rosana Fret and he confirms family mtg as conference call on 8/24 and family edu on 8/28am 7am-3pm.    Cecile Sheerer, MSW, LCSWA Office: [phone] Cell: [phone] Fax: [phone]
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
## 2022-05-23 NOTE — Progress Notes (Addendum)
Patient's heart rate was in the 120's after walking in the room. Vitals was going to be rechecked but the patient wanted to wash up first after a haircut. Patient denied any complaints. Vitals rechecked 99 114/74 110 apical pulse 18.

## 2022-05-23 NOTE — Progress Notes (Signed)
Sleep chart completed as ordered.    05/22/22 1900 05/22/22 2000 05/22/22 2100  Sleep Record  Is patient sleeping? Yes Yes Yes    05/22/22 2200 05/22/22 2300 05/23/22 0000  Sleep Record  Is patient sleeping? No No Yes    05/23/22 0100 05/23/22 0200 05/23/22 0300  Sleep Record  Is patient sleeping? Yes Yes Yes    05/23/22 0400 05/23/22 0500 05/23/22 0600  Sleep Record  Is patient sleeping? Yes Yes Yes    05/23/22 0653  Sleep Record  Is patient sleeping? No     Mylo Red, LPN
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
## 2022-05-24 DIAGNOSIS — Z21 Asymptomatic human immunodeficiency virus [HIV] infection status: Secondary | ICD-10-CM | POA: Diagnosis not present

## 2022-05-24 DIAGNOSIS — R1312 Dysphagia, oropharyngeal phase: Secondary | ICD-10-CM | POA: Diagnosis not present

## 2022-05-24 DIAGNOSIS — E119 Type 2 diabetes mellitus without complications: Secondary | ICD-10-CM | POA: Diagnosis not present

## 2022-05-24 DIAGNOSIS — S069X9S Unspecified intracranial injury with loss of consciousness of unspecified duration, sequela: Secondary | ICD-10-CM | POA: Diagnosis not present

## 2022-05-24 LAB — GLUCOSE, CAPILLARY
Glucose-Capillary: 99 mg/dL (ref 70–99)
Glucose-Capillary: 70 mg/dL (ref 70–99)
Glucose-Capillary: 94 mg/dL (ref 70–99)
Glucose-Capillary: 101 mg/dL — ABNORMAL HIGH (ref 70–99)
Glucose-Capillary: 102 mg/dL — ABNORMAL HIGH (ref 70–99)

## 2022-05-24 MED ORDER — FREE WATER
50.0000 mL | Freq: Two times a day (BID) | Status: DC
Start: 2022-05-24 — End: 2022-05-30
  Administered 2022-05-24 – 2022-05-29 (×10): 50 mL

## 2022-05-24 NOTE — Progress Notes (Signed)
Occupational Therapy TBI Note  Patient Details  Name: Zachary Hill MRN: [id number] Date of Birth: [birth year]  Today's Date: 05/24/2022 OT Individual Time: 1005-1059 OT Individual Time Calculation (min): 54 min    Short Term Goals: Week 2:  OT Short Term Goal 1 (Week 2): Pt will appropriately terminate grooming tasks at sink wiht no more than 2 cues OT Short Term Goal 2 (Week 2): Pt will initate mobility throughout session wiht no more than MOD cuing OT Short Term Goal 3 (Week 2): Pt will request use of shower chiar to demo improved awareness into deficits OT Short Term Goal 4 (Week 2): Pt will complete dressing in <10 min to demo improved initiation  Skilled Therapeutic Interventions/Progress Updates:     Pt received in bed with unrated low back pain using mobility for pain relief.   Therapeutic activity Destpite giving pt 5 min warning that he will need to get up, Pt requires up >20 min to iniate bed mobility despite cuing, timer, pt even stating, "I will get up in 1 min." Takes 5 min for pt to initiate. Pt completes 3 bouts of mobility with RW through hallway with supervision cuing for RW management to go to outside courtyard. Pt sits on park bench, crosses street looking in B directions, mobilizes over up/downhill slanted surface with S and sits at a picnic table. Multiple rests provided. W/c propulsion back to room after telling him the 3 things to write in memory notebook. Pt unable to recall when back in room. Pt able ot recall floor of hospital but not room number  Pt left at end of session in bed with exit alarm on, call light in reach and all needs met   Therapy Documentation Precautions:  Precautions Precautions: Fall Precaution Comments: PEG (with abdominal binder covering), unable to fully open R eye, fear/behavioral, impulsive, poor safety awareness Restrictions Weight Bearing Restrictions: Yes RUE Weight Bearing: Non weight bearing RLE Weight Bearing: Weight bearing  as tolerated LLE Weight Bearing: Touchdown weight bearing General:   Vital Signs: Therapy Vitals Temp: 99.3 F (37.4 C) Temp Source: Oral Pulse Rate: (!) 102 Resp: 20 BP: 103/68 Patient Position (if appropriate): Lying Oxygen Therapy SpO2: 95 % O2 Device: Room Air Pain:   Agitated Behavior Scale: TBI discontinued d/t under 20         Therapy/Group: Individual Therapy  Tonny Branch 05/24/2022, 6:53 AM

## 2022-05-24 NOTE — Progress Notes (Signed)
Speech Language Pathology Weekly Progress and Session Note  Patient Details  Name: Zachary Hill MRN: [id number] Date of Birth: December 07, 1980  Beginning of progress report period: May 17, 2022 End of progress report period: May 24, 2022  Today's Date: 05/24/2022 SLP Individual Time: 0730-0830 SLP Individual Time Calculation (min): 60 min  Short Term Goals: Week 2: SLP Short Term Goal 1 (Week 2): Patient will demonstrate sustained attention to functional tasks for 20 minutes with Max verbal cues for redirection. SLP Short Term Goal 1 - Progress (Week 2): Met SLP Short Term Goal 2 (Week 2): Patient will demonstrate functional problem solving for basic and familiar tasks with Mod A multimodal cues. SLP Short Term Goal 2 - Progress (Week 2): Met SLP Short Term Goal 3 (Week 2): Patient will utilize external memory aids for orientation to place, time, and situation with Min verbal and visual cues. SLP Short Term Goal 3 - Progress (Week 2): Met SLP Short Term Goal 4 (Week 2): Patient will consume current diet with minimal overt s/s of aspiration with Mod verbal cues for use of swallowing compensatory strategies. SLP Short Term Goal 4 - Progress (Week 2): Met SLP Short Term Goal 5 (Week 2): Patient will demonstrate efficient mastication with complete oral clearance without overt s/s of aspiration with Dys. 2 texture trials over 3 sessions prior to upgrade. SLP Short Term Goal 5 - Progress (Week 2): Met    New Short Term Goals: Week 3: SLP Short Term Goal 1 (Week 3): STGs=LTGs due to ELOS  Weekly Progress Updates: Patient has made functional gains and has met 5 of 5 STGs this reporting period. Currently, patient demonstrates behaviors consistent with a Rancho Level V-VI and requires overall Mod-Max A verbal and visual cues to complete functional and familiar tasks safely in regards to orientation, sustained attention, recall with use of compensatory strategies, problem solving and awareness.  Patient also continues to demonstrate intermittent confabulation and language of confusion but can be redirected. Patient continues to demonstrate improvement in overall swallowing function and is consuming Dys. 2 textures with thin liquids with minimal overt s/s of aspiration and Min A verbal cues for use of swallowing compensatory strategies. Patient and family education ongoing. Patient would benefit from continued skilled SLP intervention to maximize his cognitive and swallowing function prior to discharge.     Intensity: Minumum of 1-2 x/day, 30 to 90 minutes Frequency: 3 to 5 out of 7 days Duration/Length of Stay: 05/31/22 Treatment/Interventions: Cognitive remediation/compensation;Dysphagia/aspiration precaution training;Internal/external aids;Speech/Language facilitation;Cueing hierarchy;Environmental controls;Therapeutic Activities;Functional tasks;Patient/family education   Daily Session  Skilled Therapeutic Interventions:  Skilled treatment session focused on dysphagia and cognitive goals. Upon arrival, patient was awake in bed and agreeable to treatment session.  Patient sat EOB and consumed his breakfast meal of Dys. 2 textures with thin liquids with minimal overt s/s of aspiration but required Min-Mod verbal cues for use of a slow rate. Recommend patient continue current diet with full supervision. Patient demonstrated improved problem solving and attention to self-feeding and only required supervision level verbal cues. Patient completed basic self-care tasks at the sink with Mod I while standing. SLP also provided Mod verbal cues for patient to utilize external memory aids for orientation and recall of events from previous therapy sessions. Patient left supine in bed with alarm on and all needs within reach. Continue with current plan of care.     Pain No/Denies Pain   Therapy/Group: Individual Therapy  Zachary Hill 05/24/2022, 6:42 AM

## 2022-05-24 NOTE — Progress Notes (Signed)
Physical Therapy Session Note  Patient Details  Name: Zachary Hill MRN: [id number] Date of Birth: 03/12/1981  Today's Date: 05/24/2022 PT Individual Time: 1300-1415 PT Individual Time Calculation (min): 75 min   Short Term Goals: Week 2:  PT Short Term Goal 1 (Week 2): Patient will perform basic transfers with CGA consistently using LRAD. PT Short Term Goal 2 (Week 2): Patient will ambulate >100 feet using LRAD with min A. PT Short Term Goal 3 (Week 2): Patient will sit OOB >1 hour between therapy sessions. PT Short Term Goal 4 (Week 2): Patient will perform 4 steps with L rail per home set-up with min A.  Skilled Therapeutic Interventions/Progress Updates:     Patient in bed with NT finishing lunch upon PT arrival. Patient alert and agreeable to PT session. Patient denied pain during session.  Patient found to be incontinent of urine at beginning of session. Patient expressed embarrassment and frustration with this. Unable to recall strategies to call for assistance to go to the bathroom. Provided re-education on use of call bell or bed call button to call for assistance. Unsure if patient is aware when he needs to void. Provided therapeutic listening for patient's concerns and educated on TBI in relation to awareness and incontinence. Will continue to focus on strategies to address this, patient appreciative.   Patient requesting to shower due to incontinence. Discussed strategies and safety concerns with OT prior to initiating showering. Patent performed supine to/from sit with mod I with HOB slightly elevated. He performed sit to stand and an ambulatory transfer to/from with bathroom with supervision and intermittent assist for AD around equipment in the bathroom for safety.  Patient doffed soiled shirt and pants, and slippers in standing holding grab bar when in SLS to doff shoes and pants, no overt LOB with supervision. Educated patient on increased safety performing this in sitting,  patient acknowledged, but did not change strategy.    Patient initiated shower in standing, required max cues to sit on TTB for safety. Patient perseverated on rinsing his body with warm water >10 min before initiating bathing despite cues. He performed upper body and lower body bathing with set-up assist, washing several body parts 2-3 times in perseverative fashion. Provided cues for redirection to progress bathing. He washed his hair x3, reports he washes it several times PTA, however, suspect more perseverative in nature. He stood x2 with supervision, x1 without use of grab bar and has posterior LOB requiring min-mod A to correct. Reviewed safety with use of grab bar and patient in agreement.   Provided time limit of 20 min initially with motivation to go outside after a shower, as this is a salient task to the patient. Patient responded to cues with 10, 5, and 2 min count down, but did not initiate any increased speed or stop when time was over, even turned off the water, then turned it back on to wash himself again. Patient completed shower in ~45 min in total.   Performed drying off, applied lotion, and donned an incontinence brief, pants, and shirt with set-up assist. Educated patient on need for incontinence brief at this time due to decreased awareness of bladder and bowel, patient stated understanding and agreeable.   Patient in bed at end of session with breaks locked, bed alarm set, and all needs within reach. Reviewed use of call bell to call for assistance and patient able to recall without cues. Also reminded patient he could call for assistance to walk in the halls  with nursing staff if he feels restless in the room. Reinforced with nursing staff, all in agreement.   Therapy Documentation Precautions:  Precautions Precautions: Fall Precaution Comments: PEG (with abdominal binder covering), unable to fully open R eye, fear/behavioral, impulsive, poor safety  awareness Restrictions Weight Bearing Restrictions: Yes RUE Weight Bearing: Non weight bearing RLE Weight Bearing: Weight bearing as tolerated LLE Weight Bearing: Touchdown weight bearing    Therapy/Group: Individual Therapy  Joda Braatz L Keevin Panebianco PT, DPT, NCS, CBIS  05/24/2022, 6:16 AM
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
## 2022-05-25 DIAGNOSIS — E119 Type 2 diabetes mellitus without complications: Secondary | ICD-10-CM | POA: Diagnosis not present

## 2022-05-25 DIAGNOSIS — Z21 Asymptomatic human immunodeficiency virus [HIV] infection status: Secondary | ICD-10-CM | POA: Diagnosis not present

## 2022-05-25 DIAGNOSIS — S069X9S Unspecified intracranial injury with loss of consciousness of unspecified duration, sequela: Secondary | ICD-10-CM | POA: Diagnosis not present

## 2022-05-25 DIAGNOSIS — R1312 Dysphagia, oropharyngeal phase: Secondary | ICD-10-CM | POA: Diagnosis not present

## 2022-05-25 LAB — GLUCOSE, CAPILLARY
Glucose-Capillary: 85 mg/dL (ref 70–99)
Glucose-Capillary: 80 mg/dL (ref 70–99)
Glucose-Capillary: 104 mg/dL — ABNORMAL HIGH (ref 70–99)
Glucose-Capillary: 92 mg/dL (ref 70–99)

## 2022-05-25 NOTE — Progress Notes (Signed)
Patient slept well through the night, no issues.
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
## 2022-05-25 NOTE — Progress Notes (Signed)
Speech Language Pathology TBI Note  Patient Details  Name: Zachary Hill MRN: [id number] Date of Birth: December 13, 1980  Today's Date: 05/25/2022 SLP Individual Time: 0725-0820 SLP Individual Time Calculation (min): 55 min  Short Term Goals: Week 3: SLP Short Term Goal 1 (Week 3): STGs=LTGs due to ELOS  Skilled Therapeutic Interventions: Skilled treatment session focused on dysphagia and cognitive goals. Upon arrival, patient was awake while supine in the bed. Patient had been incontinent of urine and had soaked the bed with minimal awareness. SLP facilitated session by providing Mod verbal and visual cues and extra time for patient to initiate siting EOB. Patient donned a new brief, pants and shirt with overall supervision level verbal cues for safety. Patient consumed his breakfast meal of Dys. 2 textures with thin liquids without overt s/s of aspiration but required Min-Mod verbal cues for use of a slow rate of self-feeding and small bites/sips. Recommend patient continue current diet. Despite Max A multimodal cues, patient delcined to stay up in the recliner. SLP provided education regarding the importance of increasing time out of day, patient verbalized understanding but will need reinforcement. Patient utilized external aids for orientation and recall of events from previous therapy sessions with Mod verbal cues but adamant that today was his birthday despite multiple attempts at education. Patient left upright in bed with alarm on and all needs within reach. Continue with current plan of care.       Pain No/Denies Pain   Agitated Behavior Scale: TBI ABS discontinued d/t ABS score less than 20 for the last three days or no behaviors present     Therapy/Group: Individual Therapy  Gazella Anglin 05/25/2022, 6:58 AM

## 2022-05-25 NOTE — Progress Notes (Signed)
Physical Therapy TBI Note  Patient Details  Name: Zachary Hill MRN: [id number] Date of Birth: 1980/10/04  Today's Date: 05/25/2022 PT Individual Time: 0900-0915 PT Individual Time Calculation (min): 15 min   Short Term Goals: Week 2:  PT Short Term Goal 1 (Week 2): Patient will perform basic transfers with CGA consistently using LRAD. PT Short Term Goal 2 (Week 2): Patient will ambulate >100 feet using LRAD with min A. PT Short Term Goal 3 (Week 2): Patient will sit OOB >1 hour between therapy sessions. PT Short Term Goal 4 (Week 2): Patient will perform 4 steps with L rail per home set-up with min A.  Skilled Therapeutic Interventions/Progress Updates:     Pt received supine in bed and initially agrees to therapy, but says that he is tired and may take several minutes to "get moving". PT asks pt orientation questions and pt is able to correctly state location, situation, month, and year. Pt raises HOB as if he is preparing to sit up, but does not remove blankets. PT attempts to remove blankets and pt asks PT not to do so. PT waits patiently and pt continues lie in bed without making efforts to mobilize. PT educates pt on importance of participation and benefits of mobility for functional recovery. Pt verbalizes understanding but continues to lie in bed. Pt then states that he does not have "enough time to get up" and requests to stay in bed. Pt misses 45 minutes of skilled PT due to refusal to participate.  Therapy Documentation Precautions:  Precautions Precautions: Fall Precaution Comments: PEG (with abdominal binder covering), unable to fully open R eye, fear/behavioral, impulsive, poor safety awareness Restrictions Weight Bearing Restrictions: Yes RUE Weight Bearing: Non weight bearing RLE Weight Bearing: Weight bearing as tolerated LLE Weight Bearing: Touchdown weight bearing   Therapy/Group: Individual Therapy  Beau Fanny, PT, DPT 05/25/2022, 5:10 PM

## 2022-05-25 NOTE — Progress Notes (Signed)
Patient ID: Zachary Hill, male   DOB: 1981/03/15, 41 y.o.   MRN: [id number]   Patient/Family Conference  Patient/family in attendance: Zachary Hill (fiance) via conference call  Staff in attendance: Dr. Riley Kill (attending), Blanch Media (OT), Serina Cowper (PT), Kristine Garbe. Freida Busman (PT), Feliberto Gottron (SLP), and Cecile Sheerer (SW)  Main focus: Discuss care needs at discharge.   Synopsis of information shared: Discussed progress/gains made in rehab. Now on D2 thin diet, will require 24/7 supervision, ambulation supervision with use of RW, requires extra time for completing some tasks such as initiating getting out of bed, and a lot of encouragement for mobility, and self-care (instances of incontinence)  Barriers/concerns expressed by patient and family: Fiance would like incontinence supplies for patient  Patient/family response: Understands care needs pt will require, and intends to hire a friend to be with him during the day while he is at work.  Follow-up/action plans: SW will continue to coordinate care needs. Family edu remains scheduled for 8/28 7am-3pm.   -Incontinence supplies referral faxed to [facility] (p:[phone]/f:[phone]).  Cecile Sheerer, MSW, LCSWA Office: [phone] Cell: [phone] Fax: [phone]

## 2022-05-25 NOTE — Progress Notes (Signed)
Occupational Therapy Weekly Progress Note  Patient Details  Name: Zachary Hill MRN: [id number] Date of Birth: 04/14/1981  Beginning of progress report period: May 17, 2022 End of progress report period: May 25, 2022  Today's Date: 05/25/2022 OT Individual Time: 1245-1345 OT Individual Time Calculation (min): 60 min    Patient has met 3 of 3 short term goals.  Pt has made good progress towards OT goals currently at a supervision level for ADLs and transfers but requires MOD cuing for safety awareness with RW and and MAX A for memory throughout session. With automatic tasks, pt is able to sequence appropriately 75% of time, but needs intermittent cuing for pacing. Family conference was completed today, however formal training to happen prior to DC  Patient continues to demonstrate the following deficits: muscle weakness, decreased cardiorespiratoy endurance, decreased coordination, decreased visual perceptual skills, decreased initiation, decreased attention, decreased awareness, decreased problem solving, decreased safety awareness, decreased memory, delayed processing, and demonstrates behaviors consistent with [place] Level 6, and decreased sitting balance, decreased standing balance, decreased postural control, hemiplegia, decreased balance strategies, and difficulty maintaining precautions and therefore will continue to benefit from skilled OT intervention to enhance overall performance with BADL and iADL.  Patient progressing toward long term goals..  Continue plan of care.  OT Short Term Goals Week 2:  OT Short Term Goal 1 (Week 2): Pt will appropriately terminate grooming tasks at sink wiht no more than 2 cues OT Short Term Goal 1 - Progress (Week 2): Met OT Short Term Goal 2 (Week 2): Pt will initate mobility throughout session wiht no more than MOD cuing OT Short Term Goal 2 - Progress (Week 2): Met OT Short Term Goal 3 (Week 2): Pt will request use of shower chiar to demo  improved awareness into deficits OT Short Term Goal 3 - Progress (Week 2): Met OT Short Term Goal 4 (Week 2): Pt will complete dressing in <10 min to demo improved initiation OT Short Term Goal 4 - Progress (Week 2): Met Week 3:  OT Short Term Goal 1 (Week 3): STG=LTG  Skilled Therapeutic Interventions/Progress Updates:    Pt received in recliner agreeable to OT. Pt wanting to take a shower and go outside. Discussed needing to pace accordingly and pt agreeable to shorter shower to try to also have time to go outside. Pt responds well to shorter pacing cues this date. Pt with more appropriate sequencing with less repetitve bathing pattern. Pt applies lotion without repeating this date and demo improved adherence to RW management cuing. Pt completes dressing with A to fasten brief and organize self as pt randomly gets up to stand at sink and straighten brief and then attempts to walk back to the recliner without RW. Pt dons footwear and UB clothing with setup. Pt completes functional mobility to outside courtyard with RW and VC for management when taking seated rest breaks. Pt walks to and from room using elevator appropriately with good etiquitte. Pt and OT discuss routine at home to get pt active and mobile. Returned to room with NT in room to supervise feeding  Therapy Documentation Precautions:  Precautions Precautions: Fall Precaution Comments: PEG (with abdominal binder covering), unable to fully open R eye, fear/behavioral, impulsive, poor safety awareness Restrictions Weight Bearing Restrictions: Yes RUE Weight Bearing: Non weight bearing RLE Weight Bearing: Weight bearing as tolerated LLE Weight Bearing: Touchdown weight bearing   Therapy/Group: Individual Therapy  Tonny Branch 05/25/2022, 6:55 AM

## 2022-05-26 DIAGNOSIS — R1312 Dysphagia, oropharyngeal phase: Secondary | ICD-10-CM | POA: Diagnosis not present

## 2022-05-26 DIAGNOSIS — Z21 Asymptomatic human immunodeficiency virus [HIV] infection status: Secondary | ICD-10-CM | POA: Diagnosis not present

## 2022-05-26 DIAGNOSIS — S069X9S Unspecified intracranial injury with loss of consciousness of unspecified duration, sequela: Secondary | ICD-10-CM | POA: Diagnosis not present

## 2022-05-26 DIAGNOSIS — E119 Type 2 diabetes mellitus without complications: Secondary | ICD-10-CM | POA: Diagnosis not present

## 2022-05-26 LAB — BASIC METABOLIC PANEL
Anion gap: 8 (ref 5–15)
BUN: 16 mg/dL (ref 6–20)
CO2: 26 mmol/L (ref 22–32)
Calcium: 10.4 mg/dL — ABNORMAL HIGH (ref 8.9–10.3)
Chloride: 103 mmol/L (ref 98–111)
Creatinine, Ser: 1.12 mg/dL (ref 0.61–1.24)
GFR, Estimated: >60 mL/min (ref >60)
Glucose, Bld: 102 mg/dL — ABNORMAL HIGH (ref 70–99)
Potassium: 4.1 mmol/L (ref 3.5–5.1)
Sodium: 137 mmol/L (ref 135–145)

## 2022-05-26 LAB — GLUCOSE, CAPILLARY
Glucose-Capillary: 92 mg/dL (ref 70–99)
Glucose-Capillary: 85 mg/dL (ref 70–99)
Glucose-Capillary: 102 mg/dL — ABNORMAL HIGH (ref 70–99)
Glucose-Capillary: 186 mg/dL — ABNORMAL HIGH (ref 70–99)

## 2022-05-26 NOTE — Progress Notes (Signed)
Occupational Therapy TBI Note  Patient Details  Name: Zachary Hill MRN: [id number] Date of Birth: [birth year]  Today's Date: 05/26/2022 OT Individual Time: 5093-2671 OT Individual Time Calculation (min): 71 min    Short Term Goals: Week 3:  OT Short Term Goal 1 (Week 3): STG=LTG  Skilled Therapeutic Interventions/Progress Updates:  Pt awake in bed upon OT arrival to the room. Pt reports, "I just need to take a 2 minute break," as pt is participating in applying lotion. Pt in agreement for OT session.  Therapy Documentation Precautions:  Precautions Precautions: Fall Precaution Comments: PEG (with abdominal binder covering), unable to fully open R eye, fear/behavioral, impulsive, poor safety awareness Restrictions Weight Bearing Restrictions: Yes RUE Weight Bearing: Non weight bearing RLE Weight Bearing: Weight bearing as tolerated LLE Weight Bearing: Touchdown weight bearing Vital Signs: Please see "Flowsheet" for most recent vitals charted by nursing staff.  Pain: Pain Assessment Pain Scale: 0-10 Pain Score: 0-No pain Agitated Behavior Scale: ABS not needed due to ABS being below score of 20. Will re-initiate if pt demo's increase in behaviors.   ADL: ADL Upper Body Bathing: Supervision/safety, Moderate cueing (Pt able to bathe all UB parts while seated in the shower with close supervision and moderate VCs for proper sequencing and decrease perseveration on washing face. Pt requires prompts to terminate and progress with bathing sequence.) Where Assessed-Upper Body Bathing: Shower Lower Body Bathing: Contact guard (Pt able to bathe all LB parts with CGA for standing balance with use of grab bars while bathing buttocks to ensure safety. Pt able to bathe all LB parts with minimal prompts for sequencing to initiate bathing of peri-areas/buttocks and for safe technique) Where Assessed-Lower Body Bathing: Shower Upper Body Dressing: Supervision/safety (Pt able to doff and don a  pull-over style shirt with close supervision to ensure standing balance as pt (politely) declines to perform in a sitting position.) Where Assessed-Upper Body Dressing:  (standing in bathroom & standing by EOB) Lower Body Dressing:  (Pt requires maximal assistance to doff and don briefs and pants due to time constraint.) Toileting:  (Encouraged pt to attempt toileting, however, pt declines need and (politely) declines to attempt.) Film/video editor: Contact guard (Pt able to perofrm ambulatory transfer from EOB <> tub-bench in walk-in shower with CGA to ensure safety with navigating a more cluttered & wet environment and minimal VC's for safe techniques with use of FWW & grab bars.) Film/video editor Method: Designer, industrial/product: Grab bars, Transfer tub bench ADL Comments: Pt participates well in ADL routine. Pt does require the use of a timer for each step to progress OOB (moving from supine > sitting > standing). OT utilizes a visual timer for pt to be able to see the countdown (circle removing color to show a picture at the end). Pt requires use of standard timer for 15 minutes with a 5 minute warning in order to terminate shower in an appropriate timeframe. Pt overal requires moderate - maximal VC's for safe techniques, sequencing, and continuation of tasks as pt frequently requests to take breaks and does not return to completing the task. Pt able to apply lotion seated at EOB with minimal VCs for sequencing. OT encouraged fluid intake (per nursing request). OT removed the straw from the cup and pt was able to drink ~1/4 of water in the foam cup. Pt able to perform functional ambulation with FWW & bed mobility all with close supervision for safety & continuation of task. Pt requires significantly increased time to  perform ADLs due to needing increased VC's, fatigue, and perseveration on intermittent portions of tasks.    Pt returned to bed at end of session. Pt left resting  comfortably in bed with personal belongings and call light within reach, bed alarm on and activated, bed in low position, 3 bed rails up, and comfort needs attended to.   Therapy/Group: Individual Therapy  Lavera Guise 05/26/2022, 1:00 PM

## 2022-05-26 NOTE — Progress Notes (Signed)
RN at bedside to give medications. Pt is agitated and is refusing to take medications. Starting "leave me alone and get out of here." I explained is CBG was 187 and his heart rate was 101 and the importance of taking his medications. He insists on refusing his medications. Charge Nurse Dois Davenport notified and called to bedside. RN will edit med administration and attempt again. This this RN's second attempt to give meds.   2300 RN back at PT bedside to attempt to give medications again for the 3rd time. Pt yells and states I told you you to leave me alone. Consulting civil engineer notified. Vitals retaken. Pt HR is WNL BP is 103/77. Pt continues to decline all medications despite education given and risks and benefits discussed. Charge RN Dois Davenport does not suggest calling the provider at this time. RN will continue to monitor PT for change in condition.   Pt is more cooperative at this time. A/O to 3. PT says he will agree to taking his 6 am CBG. Pt agrees to take any other medications due and says he was really tired. Pt vitals retaken and is noted with 99/70 BP HR 89 Resp 16 breathing is even and unlabored. Pt voided and is without acute distress at this time.  Pt is awake and alert and pleasant at the moment. Pt is cooperative and agrees to take meds and participate in therapy today. Pt is resting without acute distress.

## 2022-05-26 NOTE — Progress Notes (Signed)
Patient incontinent, unable to collect urine sample. Patient had good night, slept from 8 pm until now 4:40 am.

## 2022-05-26 NOTE — Progress Notes (Signed)
Speech Language Pathology TBI Note  Patient Details  Name: Zachary Hill MRN: [id number] Date of Birth: [birth year]  Today's Date: 05/26/2022 SLP Individual Time: 0800-0900 SLP Individual Time Calculation (min): 60 min  Short Term Goals: Week 3: SLP Short Term Goal 1 (Week 3): STGs=LTGs due to ELOS  Skilled Therapeutic Interventions: Pt seen for skilled ST with focus on swallowing and cognitive goals, pt in bed with nursing present for AM meds and agreeable to therapeutic tasks. SLP facilitating consumption of AM meal of Dys 2 textures with thin liquids by providing overall min A cues for small bites/sips and alternating solids and liquids at an increased rate. Pt ate ~80% of breakfast meal with no overt s/s aspiration, pt sustained attention to task was functional this date. Pt completing functional writing task (addressing envelope) with min A cues for error identification and correction. Pt participating in "Following Written Directions" portion of ALFA with 80% accuracy independent. At this point, pt requesting to sit EOB and wear slippers d/t discomfort. Pt was observed to attempt to stand on his own, when asked patient replies "I have to go to the bathroom". Re-educated on need for assistance and supervision with all mobility. Pt ambulating to the bathroom with walker given min A verbal cues for safety and sequencing. Pt left on toilet with NT present to take over care, cont ST POC.  Pain Pain Assessment Pain Scale: 0-10 Pain Score: 0-No pain  Agitated Behavior Scale: TBI      Therapy/Group: Individual Therapy  Tacey Ruiz 05/26/2022, 8:51 AM

## 2022-05-26 NOTE — Progress Notes (Signed)
Physical Therapy TBI Note  Patient Details  Name: Zachary Hill MRN: [id number] Date of Birth: Aug 28, 1981  Today's Date: 05/26/2022 PT Individual Time: 0916-1000 and 1300-1315 PT Individual Time Calculation (min): 44 min and 66min  Short Term Goals: Week 1:  PT Short Term Goal 1 (Week 1): Patient will perform stand pivot transfer with LRAD and minimal assistance PT Short Term Goal 1 - Progress (Week 1): Met PT Short Term Goal 2 (Week 1): Patient will ambulate x50' with LRAD and moderate assistance x1 PT Short Term Goal 2 - Progress (Week 1): Met PT Short Term Goal 3 (Week 1): Patient will ascend/descend x4 steps with L HR and minimal assistance PT Short Term Goal 3 - Progress (Week 1): Progressing toward goal Week 2:  PT Short Term Goal 1 (Week 2): Patient will perform basic transfers with CGA consistently using LRAD. PT Short Term Goal 2 (Week 2): Patient will ambulate >100 feet using LRAD with min A. PT Short Term Goal 3 (Week 2): Patient will sit OOB >1 hour between therapy sessions. PT Short Term Goal 4 (Week 2): Patient will perform 4 steps with L rail per home set-up with min A.  Skilled Therapeutic Interventions/Progress Updates:   Pain denies pain Pt initially supine and agreeable to session.  Supine to sit w/light min assist.  Dons socks w/set up only.  Therapist applies abdominal binder. stand pivot transfer to wc w/min assist. Wc propulsion 196ft around/to dayroom.  Gait: 68ft x 2 w/RW w/supervision, slow cadence, cues for posture/upright gaze.  Standing balance: Without AD - RLE lateral steps in/out x 5, marching RLE x 5, hamdstring cuels x 5, bilat heel raises x 5 w/occasional min assist for balance.  Gait    ft w/RW w/cues/supervision, pauses occasionally inspect items in environment then proceeds at slow cadence, mild forward lean, downward gaze.  Ambulatory transfer to edge of bed w/supervision.  Pt then tasked w/completing memory notebook.  Pt w/much difficulty  recalling activites of session, confabulates and writes in notebook and requires heavy cueing to recall activities/correct.  Stands and removes binder w/supervision.  Sit to supine w/supervision.  Pt left supine w/rails up x 3, alarm set, bed in lowest position, and needs in reach.  Pm session: Pt initially supine, awake, agreeable to session.  States, "I just need a min to get my mind right".  Discussed length of session, treatment options.  Pt continued to request additional time to "prepare" himself.  Mult attempts to encourage pt to initiate exit from bed continue to fail.  Pt seemed to be becoming increasingly agitated. Pt left supine w/rails up x 3, alarm set, bed in lowest position, and needs in reach.    Therapy Documentation Precautions:  Precautions Precautions: Fall Precaution Comments: PEG (with abdominal binder covering), unable to fully open R eye, fear/behavioral, impulsive, poor safety awareness Restrictions Weight Bearing Restrictions: Yes RUE Weight Bearing: Non weight bearing RLE Weight Bearing: Weight bearing as tolerated LLE Weight Bearing: Touchdown weight bearing  Therapy Vitals Temp: 97.7 F (36.5 C) Temp Source: Oral Pain: Pain Assessment Pain Scale: 0-10 Pain Score: 0-No painNo pain  Agitated Behavior Scale: TBI      Mobility:   Locomotion :    Trunk/Postural Assessment :    Balance:   Exercises:   Other Treatments:      Therapy/Group: Individual Therapy [name], [place] 05/26/2022, 10:06 AM

## 2022-05-26 NOTE — Progress Notes (Addendum)
Patient educated on purpose of order for UA w/C&S and educated on the purpose of toileting q2. Patient has refused to be toileted by staff at times.  Also, noted patient sitting on the edge of the bed at times during this shift, (bed alarm on at all times/tele sitter in room as well) educated patient on the risk for falls and educated patient on the importance of being in bed properly. Patient repositioned in bed. Patient also refused for staff to assist with personal care. Patient later did allow staff to assist with personal care. Patient was cooperative. PA is aware and MD made face to face visit. Notifed oncoming nurse. Also, made Dr. Benjie Karvonen aware (Physician working the weekend).  Tilden Dome, LPN
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
## 2022-05-27 LAB — URINALYSIS, ROUTINE W REFLEX MICROSCOPIC
Bilirubin Urine: NEGATIVE
Glucose, UA: NEGATIVE mg/dL
Hgb urine dipstick: NEGATIVE
Ketones, ur: NEGATIVE mg/dL
Leukocytes,Ua: NEGATIVE
Nitrite: NEGATIVE
Protein, ur: NEGATIVE mg/dL
Specific Gravity, Urine: 1.02 (ref 1.005–1.030)
pH: 6 (ref 5.0–8.0)

## 2022-05-27 LAB — GLUCOSE, CAPILLARY
Glucose-Capillary: 99 mg/dL (ref 70–99)
Glucose-Capillary: 92 mg/dL (ref 70–99)
Glucose-Capillary: 86 mg/dL (ref 70–99)
Glucose-Capillary: 91 mg/dL (ref 70–99)

## 2022-05-27 NOTE — Progress Notes (Signed)
UA obtained and C&S per MD order. Notified Dr. Benjie Karvonen.  Tilden Dome, LPN

## 2022-05-27 NOTE — Progress Notes (Signed)
Occupational Therapy Session Note  Patient Details  Name: Zachary Hill MRN: [id number] Date of Birth: 05/19/1981  Today's Date: 05/27/2022 OT Individual Time: 1020-1113 OT Individual Time Calculation (min): 53 min   Today's Date: 05/27/2022 OT Individual Time: 1330-1415 OT Individual Time Calculation (min): 45 min   Short Term Goals: Week 1:  OT Short Term Goal 1 (Week 1): Pt will terminate oral care wiht no more than 2 cues OT Short Term Goal 1 - Progress (Week 1): Progressing toward goal OT Short Term Goal 2 (Week 1): Pt will appropriately use ADL items with no cuing to demo improved ideation OT Short Term Goal 2 - Progress (Week 1): Met OT Short Term Goal 3 (Week 1): Pt will complete functional trnasfers wiht CGA OT Short Term Goal 3 - Progress (Week 1): Met OT Short Term Goal 4 (Week 1): Pt will sequence bathing body parts with MOD cuing OT Short Term Goal 4 - Progress (Week 1): Met OT Short Term Goal 5 (Week 1): Pt will don shirt with supervsion OT Short Term Goal 5 - Progress (Week 1): Met  Skilled Therapeutic Interventions/Progress Updates:     Pt received in bed with Jammel (s/o) present requesting to imitate family education.   Therapeutic activity General mobility- they should always use their RW. They may benefit from a walker tray to transport items from room to room if walking with a walker is recommended by physical therapy. The walker should be kept within reach so they can pull it close to get up and keep with them to back up to any surface they want to sit on. When getting up, they should push up from the surface they are getting up from and reach back when sitting to a new surface, no plopping.  You are their "shadow." Especially in the beginning. You, as the helper should be in reach of the patient when mobilizing. You should be either beside or behind them so if they lose their balance you can assist by helping correct at the hips. This is likely closer than you  are used to being- be in their personal space. Use a gait belt if that makes you feel more comfortable If you are attempting to get up/transfer and it is not going well, reset. Have them sit back down. Make sure they are close to the edge of the seat, feet are underneath them at hips distance, and they are leaning forward to stand up. Bathing- they should sit to bathe on a shower chair, especially for washing legs/feet. Sitting will save energy and increase safety.  For tub shower with Tub bench: use walker to get to the edge of the tub bench, back up to the edge, reach back prior to sitting down. Turn to swing legs into tub and scoot across. Reverse to exit the tub. Make sure both legs are out of the tub prior to standing to exit the bathroom Use  a timer to improve pacing of activites and as a visual to help pt terminate showers Dressing- all should be done from a SEATED level, especially to put underwear and pants over feet.  Toileting- the RW can be walked right over the toilet for standing urination if applicable. If seated toileting is more appropriate, have them walk up to the toilet and keep walker with them as they turn to sit to toilet or BSC. Before they stand to pull up pants past hips they should pull pants/underwear up past their knees to decrease the need   to bend forward to the floor. Sometimes this makes people dizzy if incontinence/bathroom accidents are an issue attempt to toilet every 2-3 hours to improve success with toileting and decrease accidents.  Discussed importance of a routine and using this to help pt with self organization as well as continence. Pt would benefit from an activity schedule at home to continue to orient pt to days and improve memory throughout the day. Jammel in agreement Energy conservation principles- Prioritize what needs to be done and what can be moved to another day Plan out their days, weeks, months to spread out taxing (physical or cognitively tiring)  activities to not put too much at one time Pace activities- rest before feeling tired and have designated places to rest if they feel tired and need to take a brake Position for success: sit when able to conserve 25% more energy than standing  Provided handouts on memory after sever TBI, caregiver stress management, alcohol consumption, couples relationships, fatigue, and a TBI ID card for in patients wallet.   Checked Jammel off for bathroom transfers while pt in the hospital but verbalized understanding if he left he needed to tell nursing to put alarms back on.  Pt left at end of session in recliner with exit alarm on, call light in reach and all needs met   Session 2: Pt completes bathing and dressing at shower level with VC for walking RW over toilet for standing urine void prior to shower. Pt completes bathing mostly seated except for peri area bathing. Pt completes shower prior to 18 min timer going off with 10 min left and 5 min left cue. Pt completes dressing with A only to don brief otherwise requiring increased time for self organization/requesting items that were forgotten (lotion/cocoa butter). Pt resides in recliner at end of session with belt alarm on and call light in reach.   Therapy Documentation Precautions:  Precautions Precautions: Fall Precaution Comments: PEG (with abdominal binder covering), unable to fully open R eye, fear/behavioral, impulsive, poor safety awareness Restrictions Weight Bearing Restrictions: Yes RUE Weight Bearing: Non weight bearing RLE Weight Bearing: Weight bearing as tolerated LLE Weight Bearing: Touchdown weight bearing Therapy/Group: Individual Therapy  Tonny Branch 05/27/2022, 6:52 AM

## 2022-05-27 NOTE — Progress Notes (Signed)
Physical Therapy TBI Note  Patient Details  Name: Zachary Hill MRN: [id number] Date of Birth: [birth year]  Today's Date: 05/27/2022 PT Individual Time: 2284-0698 PT Individual Time Calculation (min): 45 min   Short Term Goals: Week 1:  PT Short Term Goal 1 (Week 1): Patient will perform stand pivot transfer with LRAD and minimal assistance PT Short Term Goal 1 - Progress (Week 1): Met PT Short Term Goal 2 (Week 1): Patient will ambulate x50' with LRAD and moderate assistance x1 PT Short Term Goal 2 - Progress (Week 1): Met PT Short Term Goal 3 (Week 1): Patient will ascend/descend x4 steps with L HR and minimal assistance PT Short Term Goal 3 - Progress (Week 1): Progressing toward goal  Skilled Therapeutic Interventions/Progress Updates:  Pt was seen bedside in the am with significant other at bedside. [name] requested family training in preparation for discharge home. Pt performed bed mobility with S without rails. Performed transfers with/without rolling walker and c/g to min A with verbal cues. Pt ambulated with rolling walker and c/g to min A about 75 feet with verbal cues. Pt ascended 4 stairs with L rail and R hand hold assist with min A and verbal cues. Pt performed car transfers with rolling walker and c/g to min A with rolling walker. Demonstrated use of gait belt for [name]. [name] was able to performed all transfers and ambulation safely with patient. At end of treatment returned to room and left sitting up in bed all needs within reach.   Therapy Documentation Precautions:  Precautions Precautions: Fall Precaution Comments: PEG (with abdominal binder covering), unable to fully open R eye, fear/behavioral, impulsive, poor safety awareness Restrictions Weight Bearing Restrictions: Yes RLE Weight Bearing: Weight bearing as tolerated General:   Pain: Pain Assessment Pain Scale: 0-10 Pain Score: 0-No pain   Therapy/Group: Individual Therapy  Dub Amis 05/27/2022,  12:31 PM

## 2022-05-28 DIAGNOSIS — R509 Fever, unspecified: Secondary | ICD-10-CM

## 2022-05-28 DIAGNOSIS — E119 Type 2 diabetes mellitus without complications: Secondary | ICD-10-CM | POA: Diagnosis not present

## 2022-05-28 DIAGNOSIS — S069X3S Unspecified intracranial injury with loss of consciousness of 1 hour to 5 hours 59 minutes, sequela: Secondary | ICD-10-CM | POA: Diagnosis not present

## 2022-05-28 DIAGNOSIS — D649 Anemia, unspecified: Secondary | ICD-10-CM

## 2022-05-28 DIAGNOSIS — R1310 Dysphagia, unspecified: Secondary | ICD-10-CM | POA: Diagnosis not present

## 2022-05-28 LAB — URINE CULTURE: Culture: NO GROWTH

## 2022-05-28 LAB — GLUCOSE, CAPILLARY
Glucose-Capillary: 85 mg/dL (ref 70–99)
Glucose-Capillary: 86 mg/dL (ref 70–99)

## 2022-05-28 MED ORDER — INSULIN GLARGINE-YFGN 100 UNIT/ML ~~LOC~~ SOLN
18.0000 [IU] | Freq: Two times a day (BID) | SUBCUTANEOUS | Status: DC
Start: 2022-05-28 — End: 2022-05-30
  Administered 2022-05-28 – 2022-05-30 (×4): 18 [IU] via SUBCUTANEOUS
  Filled 2022-05-28 (×5): qty 0.18

## 2022-05-28 NOTE — Progress Notes (Signed)
Physical Therapy Weekly Progress Note  Patient Details  Name: Zachary Hill MRN: [id number] Date of Birth: 1981/08/03  Beginning of progress report period: May 17, 2022 End of progress report period: May 28, 2022  Today's Date: 05/28/2022 PT Individual Time: 0920-1005 and 1500-1515 PT Individual Time Calculation (min): 45 min and 15 min  Patient has met 4 of 4 short term goals.  Patient has made slow, but steady progress this week. Continues to be limited by reduced internal motivation, decreased awareness, and incontinence. Patient is more consistently oriented this week with or without use of visual aids in his room, continues to have significant STM deficits with poor recall and carry-over, and confabulates when he is unable to remember. He performs bed mobility with mod I, transfers with supervision, gait >100 ft with CGA and intermittent min A, and 8 steps with L rail with CGA-min A to simulate home entry. Family education has been initiated with his SO, [name], via conference call for family meeting, and hands on training x1. Plan for continued hands on training and TBI education prior to d/c this week.   Patient continues to demonstrate the following deficits muscle weakness and muscle joint tightness, decreased cardiorespiratoy endurance, decreased initiation, decreased attention, decreased awareness, decreased problem solving, decreased safety awareness, decreased memory, delayed processing, and demonstrates behaviors consistent with [place] Level VII, and decreased sitting balance, decreased standing balance, decreased postural control, decreased balance strategies, and difficulty maintaining precautions and therefore will continue to benefit from skilled PT intervention to increase functional independence with mobility.  Patient progressing toward long term goals.  Continue plan of care.  PT Short Term Goals Week 2:  PT Short Term Goal 1 (Week 2): Patient will perform basic  transfers with CGA consistently using LRAD. PT Short Term Goal 1 - Progress (Week 2): Met PT Short Term Goal 2 (Week 2): Patient will ambulate >100 feet using LRAD with min A. PT Short Term Goal 2 - Progress (Week 2): Met PT Short Term Goal 3 (Week 2): Patient will sit OOB >1 hour between therapy sessions. PT Short Term Goal 3 - Progress (Week 2): Met PT Short Term Goal 4 (Week 2): Patient will perform 4 steps with L rail per home set-up with min A. PT Short Term Goal 4 - Progress (Week 2): Met Week 3:  PT Short Term Goal 1 (Week 3): STG=LTG due to ELOS.  Skilled Therapeutic Interventions/Progress Updates:     Session 1: Patient in bed upon PT arrival. Patient alert and agreeable to PT session. Patient denied pain during session.  Therapeutic Activity: Bed Mobility: Patient performed supine to/from sit with mod I for increased time in a flat bed without use of bed rails.  Transfers: Patient performed sit to/from stand x4 with distant supervision with and without RW. Provided verbal cues for impulsivity x1. Patient performed and ambulatory toilet transfer with min A HHA without AD, voided in standing with supervision for balance, performed lower body clothing management independently. Performed hand hygiene with cues for initiation, spontaneously washed his face and brushed his teeth independently standing at the sink. Performed standing balance with intermittent upper extremity support with supervision.   Gait Training:  Patient ambulated >150 feet x2 using RW with supervision. Ambulated with decreased gait speed, decreased step length and height, decreased visual scanning, forward trunk lean, and downward head gaze.Marland Kitchen Provided verbal cues for erect posture, safe proximity to RW, and path finding back to the room on second trial with improved visual scanning of environment,  no cues required for path finding.  Patient in recliner in the room at end of session with breaks locked, chair alarm set,  and all needs within reach.   Session 2: Patient in bed with SO, [name], in the room upon PT arrival. Patient alert and reporting fatigue this afternoon. Reports he slept well last night, but continues to feel fatigued intermittently with decreased internal motivation. Focused session on patient/caregiver education. Reviewed mobility performed during yesterday's hands on training for gait, stairs and car transfers. Educated on safety precautions related to 24/7 supervision, patient within arms reach when standing or ambulating with RW due to fall risk, cuing for safety awareness, used teach back method for [name] to describe safety threats to the patient, such as removing weapons from the home, preventing the patient from driving or using heavy machinery, abstaining from alcohol, cigarettes, and illicit drug use, and contacting emergency services in the event of a fall with hit to the head or other injury or signs of medical complication. Provided [name] with website for [facility] TBI patient guide and [facility]. Patient and SO attentive and receptive to all education.   Patient and [name] without further questions at this time, both feeling confident for d/c this week.  Patient in bed with [name] in the room at end of session with breaks locked, bed alarm set, and all needs within reach.   Therapy Documentation Precautions:  Precautions Precautions: Fall Precaution Comments: PEG (with abdominal binder covering), unable to fully open R eye, fear/behavioral, impulsive, poor safety awareness Restrictions Weight Bearing Restrictions: Yes RUE Weight Bearing: Non weight bearing RLE Weight Bearing: Weight bearing as tolerated LLE Weight Bearing: Touchdown weight bearing General:     Therapy/Group: Individual Therapy  Ayala Ribble L Kelvyn Schunk PT, DPT, NCS, CBIS  05/28/2022, 12:26 PM
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
## 2022-05-29 DIAGNOSIS — R1312 Dysphagia, oropharyngeal phase: Secondary | ICD-10-CM | POA: Diagnosis not present

## 2022-05-29 DIAGNOSIS — S069X9S Unspecified intracranial injury with loss of consciousness of unspecified duration, sequela: Secondary | ICD-10-CM | POA: Diagnosis not present

## 2022-05-29 DIAGNOSIS — E119 Type 2 diabetes mellitus without complications: Secondary | ICD-10-CM | POA: Diagnosis not present

## 2022-05-29 DIAGNOSIS — Z21 Asymptomatic human immunodeficiency virus [HIV] infection status: Secondary | ICD-10-CM | POA: Diagnosis not present

## 2022-05-29 LAB — CBC
HCT: 30.3 % — ABNORMAL LOW (ref 39.0–52.0)
Hemoglobin: 9.7 g/dL — ABNORMAL LOW (ref 13.0–17.0)
MCH: 26.7 pg (ref 26.0–34.0)
MCHC: 32 g/dL (ref 30.0–36.0)
MCV: 83.5 fL (ref 80.0–100.0)
Platelets: 255 10*3/uL (ref 150–400)
RBC: 3.63 MIL/uL — ABNORMAL LOW (ref 4.22–5.81)
RDW: 16.6 % — ABNORMAL HIGH (ref 11.5–15.5)
WBC: 5 10*3/uL (ref 4.0–10.5)
nRBC: 0 % (ref 0.0–0.2)

## 2022-05-29 LAB — GLUCOSE, CAPILLARY
Glucose-Capillary: 82 mg/dL (ref 70–99)
Glucose-Capillary: 89 mg/dL (ref 70–99)

## 2022-05-29 LAB — BASIC METABOLIC PANEL
Anion gap: 9 (ref 5–15)
BUN: 12 mg/dL (ref 6–20)
CO2: 25 mmol/L (ref 22–32)
Calcium: 10 mg/dL (ref 8.9–10.3)
Chloride: 105 mmol/L (ref 98–111)
Creatinine, Ser: 0.88 mg/dL (ref 0.61–1.24)
GFR, Estimated: >60 mL/min (ref >60)
Glucose, Bld: 84 mg/dL (ref 70–99)
Potassium: 4 mmol/L (ref 3.5–5.1)
Sodium: 139 mmol/L (ref 135–145)

## 2022-05-29 NOTE — Progress Notes (Signed)
Liquids and foods removed from patient bedside. Pt is resting with eyes closed. Educated patient on NPO and procedure at beginning of shift. Pt in agreement. Mylo Red, LPN
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
## 2022-05-29 NOTE — Progress Notes (Signed)
Physical Therapy Session Note  Patient Details  Name: Zachary Hill MRN: [id number] Date of Birth: 1981-01-02  Today's Date: 05/29/2022 PT Individual Time: 1696-7893 and 1300-1345 PT Individual Time Calculation (min): 28 min and 45 min  Short Term Goals: Week 3:  PT Short Term Goal 1 (Week 3): STG=LTG due to ELOS.  Skilled Therapeutic Interventions/Progress Updates:     1st Session: Pt received supine in bed and agrees to therapy. Pt's significant other present for family education. Pt does not verbalize any pain. Supine to sit with cues for sequencing and positioning at EOB. Pt performs stand pivot transfer to [facility] with RW and close supervision, with cues for hand placement and sequencing. WC transport to gym for time management. Pt completes x4 steps with L hand rail and R hand held assistance from [place] (fiance), with PT providing close supervision and cues for pt and caregiver safety. WC transport back to room. PT provides education on importance of continued mobility following discharge and recommendations for frequency and duration of ambulation. Pt left seated in WC with alarm intact and all needs within reach.  2nd Session: Pt received supine in bed. Says that he is cold. PT tells pt that he will provide warm blanket if pt will transfer to [facility]. Pt initially agreeable and says he will get up and try walking, but then pt becomes irritated with this PT and procrastinates, saying that he may get up and he may not get up, but will not provide a straight answer on whether he will participate or not. PT provides pt with encouragement and education on importance of participation, and pt verbalizes understanding. Pt continues to use circular logic/reasoning and effectively refuses to mobilize. PT provides continued cueing and education on benefits of mobility, with pt indicating at times that he will participate, and then appearing to lose interest and remaining stationary in bed. Pt's primary  therapist comes to room to attempt to redirect and motivate pt for participation, but pt voices that he is upset and does not feel like participating at this time. Pt misses 30 minutes of skilled PT due to refusal.  Therapy Documentation Precautions:  Precautions Precautions: Fall Precaution Comments: PEG (with abdominal binder covering), unable to fully open R eye, fear/behavioral, impulsive, poor safety awareness Restrictions Weight Bearing Restrictions: Yes RUE Weight Bearing: Non weight bearing RLE Weight Bearing: Weight bearing as tolerated LLE Weight Bearing: Touchdown weight bearing  Therapy/Group: Individual Therapy  Beau Fanny, PT, DPT 05/29/2022, 4:44 PM

## 2022-05-29 NOTE — Progress Notes (Signed)
Occupational Therapy TBI Note  Patient Details  Name: Zachary Hill MRN: [id number] Date of Birth: 1981-04-21  Today's Date: 05/29/2022 OT Individual Time: 0370-4888 OT Individual Time Calculation (min): 70 min   Short Term Goals: Week 3:  OT Short Term Goal 1 (Week 3): STG=LTG  Skilled Therapeutic Interventions/Progress Updates:    Pt greeted semi-reclined in bed with SO [name] present for family education focused on self-care retraining at shower level. Practiced bed mobility from flat bed as this is what he will have at home. Per [name]'s report, pt usually takes a long time to shower. Educated on self-regulation techniques and external cues for time management. Pt's SO provided appropriate cues for safety. He took care of his mother after her stroke and seems to have a good handle on caretaking needs. Overall supervision/CGA for all BADL tasks at shower level. Pt left seated in recliner at end of session with significant other present, call bell in reach, and needs met.    Therapy Documentation Precautions:  Precautions Precautions: Fall Precaution Comments: PEG (with abdominal binder covering), unable to fully open R eye, fear/behavioral, impulsive, poor safety awareness Restrictions Weight Bearing Restrictions: Yes RUE Weight Bearing: Non weight bearing RLE Weight Bearing: Weight bearing as tolerated LLE Weight Bearing: Touchdown weight bearing Pain:   Denies pain Therapy/Group: Individual Therapy  Valma Cava 05/29/2022, 9:21 AM

## 2022-05-29 NOTE — Progress Notes (Signed)
Speech Language Pathology TBI Note  Patient Details  Name: Zachary Hill MRN: [id number] Date of Birth: [birth year]  Today's Date: 05/29/2022 SLP Individual Time: 0730-0815 SLP Individual Time Calculation (min): 45 min  Short Term Goals: Week 3: SLP Short Term Goal 1 (Week 3): STGs=LTGs due to ELOS  Skilled Therapeutic Interventions: Skilled treatment session focused on cognitive goals and completion of family education with the patient and his significant other. Upon arrival, patient was asleep in bed and was slow to rouse. Patient declined sitting EOB or getting into the recliner for PO intake despite Max encouragement. With more than a reasonable amount of time, patient eventually agreeable to sit upright in bed. Patient consumed his meal of Dys. 2 textures with thin liquids without overt s/s of aspiration but required Min verbal cues for use of swallowing compensatory strategies. Recommend patient continue current diet. Patient's significant other present and educated regarding patient's current swallowing function, diet recommendations, appropriate textures, medication administration, and swallowing compensatory strategies. SLP also facilitated session by providing education regarding strategies to utilize at home to maximize attention, initiation, memory and overall safety. Handouts were also given to reinforce information. The patient's significant other verbalized and demonstrated understanding of all information. Patient left upright in bed with alarm on and all needs within reach. Continue with current plan of care.   Pain Pain Assessment Pain Scale: 0-10 Pain Score: 0-No pain  Agitated Behavior Scale: TBI  *Agitated Behavior Scale not completed due to no unsafe behaviors documented since admission *    Therapy/Group: Individual Therapy  Zachary Hill 05/29/2022, 3:02 PM

## 2022-05-30 DIAGNOSIS — R1312 Dysphagia, oropharyngeal phase: Secondary | ICD-10-CM | POA: Diagnosis not present

## 2022-05-30 DIAGNOSIS — E119 Type 2 diabetes mellitus without complications: Secondary | ICD-10-CM | POA: Diagnosis not present

## 2022-05-30 DIAGNOSIS — S069X9S Unspecified intracranial injury with loss of consciousness of unspecified duration, sequela: Secondary | ICD-10-CM | POA: Diagnosis not present

## 2022-05-30 DIAGNOSIS — Z21 Asymptomatic human immunodeficiency virus [HIV] infection status: Secondary | ICD-10-CM | POA: Diagnosis not present

## 2022-05-30 LAB — GLUCOSE, CAPILLARY
Glucose-Capillary: 93 mg/dL (ref 70–99)
Glucose-Capillary: 141 mg/dL — ABNORMAL HIGH (ref 70–99)

## 2022-05-30 MED ORDER — INSULIN GLARGINE-YFGN 100 UNIT/ML ~~LOC~~ SOLN
10.0000 [IU] | Freq: Two times a day (BID) | SUBCUTANEOUS | Status: DC
  Administered 2022-05-30 – 2022-05-31 (×2): 10 [IU] via SUBCUTANEOUS
  Filled 2022-05-30 (×4): qty 0.1

## 2022-05-30 NOTE — Progress Notes (Signed)
Physical Therapy TBI Note  Patient Details  Name: Zachary Hill MRN: [id number] Date of Birth: 24-May-1981  Today's Date: 05/30/2022 PT Individual Time: 0905-1002 PT Individual Time Calculation (min): 57 min   Short Term Goals: Week 1:  PT Short Term Goal 1 (Week 1): Patient will perform stand pivot transfer with LRAD and minimal assistance PT Short Term Goal 1 - Progress (Week 1): Met PT Short Term Goal 2 (Week 1): Patient will ambulate x50' with LRAD and moderate assistance x1 PT Short Term Goal 2 - Progress (Week 1): Met PT Short Term Goal 3 (Week 1): Patient will ascend/descend x4 steps with L HR and minimal assistance PT Short Term Goal 3 - Progress (Week 1): Progressing toward goal Week 2:  PT Short Term Goal 1 (Week 2): Patient will perform basic transfers with CGA consistently using LRAD. PT Short Term Goal 1 - Progress (Week 2): Met PT Short Term Goal 2 (Week 2): Patient will ambulate >100 feet using LRAD with min A. PT Short Term Goal 2 - Progress (Week 2): Met PT Short Term Goal 3 (Week 2): Patient will sit OOB >1 hour between therapy sessions. PT Short Term Goal 3 - Progress (Week 2): Met PT Short Term Goal 4 (Week 2): Patient will perform 4 steps with L rail per home set-up with min A. PT Short Term Goal 4 - Progress (Week 2): Met Week 3:  PT Short Term Goal 1 (Week 3): STG=LTG due to ELOS.  Skilled Therapeutic Interventions/Progress Updates:  Patient sidelying to R side in bed on entrance to room. Patient alert and agreeable to PT session. Relates that he has just had his PEG tube removed by Dr. Ephriam Knuckles.   Patient with mild pain/ tenderness complaint at start of session d/t recent PEG removal.   Therapeutic Activity: Bed Mobility: Pt performed supine <> sit with mod I. No vc required - just additional time d/t tenderness/ soreness at abdomen. Transfers: Pt performed sit<>stand and stand pivot transfers throughout session with supervision. No vc required for  technique.  Gait Training:  Pt ambulated in room distances using RW with distant SUP. Completes amb from main gym to room with no cues for directionality and vc only for upright posture and attempt to decrease BUE pressure into walker. Demonstrated forward flexed posture d/t abd soreness and decreased but adequate step height bilaterally.   6MWT completed with pt reaching 394 feet and requiring 8 standing rest breaks throughout.   Pt provided with verbal instructions and visual demonstration of navigation of curb step with RW. Pt completes 4" curb step with use of RW and light CGA. No vc throughout completion of curb step.   Neuromuscular Re-ed: NMR facilitated during session with focus on standing balance. Pt guided in Berg Balance Test. Patient demonstrates increased fall risk as noted by score of  34 /56 on Berg Balance Scale.  (<36= high risk for falls, close to 100%; 37-45 significant >80%; 46-51 moderate >50%; 52-55 lower >25%)   Pt also completed 5xSTS with average of 26.84 seconds (A score of 15 seconds or greater indicates patient is at an increased risk for falls. Education provided to patient on interpretation of balance score)    NMR performed for improvements in motor control and coordination, balance, sequencing, judgement, and self confidence/ efficacy in performing all aspects of mobility at highest level of independence.   Patient supine at end of session with brakes locked, bed alarm set, and all needs within reach. RN in room to provide  medications.    Therapy Documentation Precautions:  Precautions Precautions: Fall Precaution Comments: PEG (with abdominal binder covering), unable to fully open R eye, fear/behavioral, impulsive, poor safety awareness Restrictions Weight Bearing Restrictions: Yes RUE Weight Bearing: Non weight bearing RLE Weight Bearing: Weight bearing as tolerated LLE Weight Bearing: Touchdown weight bearing General:   Vital Signs: Therapy  Vitals Temp: 98.5 F (36.9 C) Temp Source: Oral Pulse Rate: 74 Resp: 16 BP: 91/69 Patient Position (if appropriate): Lying Oxygen Therapy SpO2: 99 % O2 Device: Room Air Pain:  No pain complaint this session.   Agitated Behavior Scale: TBI Observation Details Observation Environment: pt room Start of observation period - Date: 05/30/22 Start of observation period - Time: 1900 End of observation period - Date: 05/30/22 End of observation period - Time: 0523 Agitated Behavior Scale (DO NOT LEAVE BLANKS) Short attention span, easy distractibility, inability to concentrate: Present to a slight degree Impulsive, impatient, low tolerance for pain or frustration: Absent Uncooperative, resistant to care, demanding: Absent Violent and/or threatening violence toward people or property: Absent Explosive and/or unpredictable anger: Absent Rocking, rubbing, moaning, or other self-stimulating behavior: Absent Pulling at tubes, restraints, etc.: Absent Wandering from treatment areas: Absent Restlessness, pacing, excessive movement: Absent Repetitive behaviors, motor, and/or verbal: Absent Rapid, loud, or excessive talking: Absent Sudden changes of mood: Absent Easily initiated or excessive crying and/or laughter: Absent Self-abusiveness, physical and/or verbal: Absent Agitated behavior scale total score: 15  Balance: Balance Balance Assessed: Yes Static Sitting Balance Static Sitting - Balance Support: No upper extremity supported Static Sitting - Level of Assistance: 7: Independent Dynamic Sitting Balance Dynamic Sitting - Level of Assistance: 6: Modified independent (Device/Increase time) Static Standing Balance Static Standing - Level of Assistance: 6: Modified independent (Device/Increase time) Dynamic Standing Balance Dynamic Standing - Level of Assistance: 6: Modified independent (Device/Increase time)  Therapy/Group: Individual Therapy  Alger Simons PT, DPT,  CSRS 05/30/2022, 8:46 AM

## 2022-05-30 NOTE — Progress Notes (Signed)
Patient ID: Zachary Hill, male   DOB: [birth year], 41 y.o.   MRN: [id number]  SW faxed PT/OT/SLP referral to [facility] [place] (p:[phone]/f:[phone]).  SW spoke with pt s/o [name] to provide updates from team conference, gains made in rehab, and d/c date remains tomorrow. SW informed on outpatient PT/OT/SLP referral faxed to [facility] location. He confirms incontinence supplies arrived two days ago.SW discussed if he has received CAP/DA packet. Reports he has not received yet. SW reminded him it is coming from [place] so to be patient, and when items arrive to make contact with [facility] to complete. No additional questions/concerns reported.   1518-SW spoke with Eli Phillips with [facility] CAP/DA program to check status of referral and reports that Teodoro Kil is behind and addressing system is accurate.   Cecile Sheerer, MSW, LCSWA Office: [phone] Cell: [phone] Fax: [phone]

## 2022-05-30 NOTE — Progress Notes (Signed)
Speech Language Pathology Discharge Summary  Patient Details  Name: Zachary Hill MRN: [id number] Date of Birth: 1981-03-24  Date of Discharge from SLP service:May 30, 2022  Today's Date: 05/30/2022 SLP Individual Time: 0830-0900 SLP Individual Time Calculation (min): 30 min   Skilled Therapeutic Interventions:  Skilled treatment session focused on cognitive goals. Upon arrival, patient was awake in bed. Patient had been incontinent of urine and had soaked through the bed. Patient agreeable to ambulate to the commode to donn a new brief. Patient initiated movement more timely this session and was continent of urine once on commode. Patient was then transferred to the recliner as linens need to be changed. During the session, the physician arrived and removed the patient's PEG tube. SLP facilitated session by SLP re-administering the [name] Mental Status Examination (SLUMS). Patient scored  22/30 points with a score of 27 or above considered normal with deficits in short-term recall and problem solving. However, patient's score improved 12 points since initial assessment on 05/15/22. Patient educated on results and SLP reinforced need for use of compensatory strategies at home to maximize recall and carryover of functional information. Patient verbalized understanding and was transferred back to bed at end of session. Patient left with alarm on and all needs within reach.    Patient has met 7 of 7 long term goals.  Patient to discharge at overall Min-Mod level.   Reasons goals not met: N/A   Clinical Impression/Discharge Summary: Patient has made functional gains and has met 7 of 7 LTGs this admission. Currently, patient is demonstrating behaviors consistent with a [place]o Level VI-emerging VII and requires overall Min-Mod A multimodal cues to complete functional and familiar tasks safely in regards to initiation, sustained attention, functional problem solving, recall with use of  compensatory strategies, and emergent awareness. Patient continues to demonstrate intermittent language of confusion at times. Patient is consuming Dys. 2 textures with thin liquids with minimal overt s/s of aspiration and Min verbal cues for use of swallowing compensatory strategies. Patient and family education is complete and patient will discharge home with 24 hour supervision from caregivers. Patient would benefit from f/u SLP services to maximize his cognitive and swallowing function in order to reduce caregiver burden.   Care Partner:  Caregiver Able to Provide Assistance: Yes  Type of Caregiver Assistance: Physical;Cognitive  Recommendation:  24 hour supervision/assistance;Outpatient SLP  Rationale for SLP Follow Up: Reduce caregiver burden;Maximize cognitive function and independence;Maximize swallowing safety   Equipment: N/A   Reasons for discharge: Discharged from hospital;Treatment goals met   Patient/Family Agrees with Progress Made and Goals Achieved: Yes    Tayden Duran, [place] 05/30/2022, 6:54 AM

## 2022-05-30 NOTE — Progress Notes (Signed)
Occupational Therapy Session Note  Patient Details  Name: Zachary Hill MRN: [id number] Date of Birth: 01/06/1981  Today's Date: 05/30/2022 OT Individual Time: 7829-5621 OT Individual Time Calculation (min): 42 min    Short Term Goals: Week 3:  OT Short Term Goal 1 (Week 3): STG=LTG  Skilled Therapeutic Interventions/Progress Updates:  Pt awake in bed upon OT arrival to the room. Pt reports, "great," regarding planned DC tomorrow. Pt in agreement for OT session.  Therapy Documentation Precautions:  Precautions Precautions: Fall Precaution Comments: PEG (with abdominal binder covering), unable to fully open R eye, fear/behavioral, impulsive, poor safety awareness Restrictions Weight Bearing Restrictions: No RUE Weight Bearing: Non weight bearing RLE Weight Bearing: Weight bearing as tolerated LLE Weight Bearing: Touchdown weight bearing Vital Signs: Please see "Flowsheet" for most recent vitals charted by nursing staff.  Pain: Pain Assessment Pain Scale: 0-10 Pain Score: 0-No pain   ADL: Pt declines need to perform ADL's at this time. Pt able to don/doff B socks and shoes with set-up assistance while long sitting or sitting at EOB.   Functional Mobility & Pre-Community Mobility Navigation Skills: Pt participates in functional mobility & pre-community mobility navigation task in order to improve functional endurance and independence on navigating a familiar environment. Education provided to pt on importance of using environmental cues to safely navigate within an environment. Pt able to perform sit <> stand from EOB and edge of mat during session with close supervision and use of FWW. Pt able to perform functional ambulation from room <> ortho gym (250' x 2) with a seated rest break once reaching the ortho gym with use of FWW and close supervision for safety. Pt requires 1 standing rest break on ambulation trip from room > ortho gym. Pt requires minimal prompts for proper  posture. Pt then encouraged to navigate back to room and able to do so independently for navigation & close supervision for ambulation with no prompts needed for recall of proper route back to room. Pt does require increased time for ambulation due to minimally slowed cadence and need for seated rest break.   Initiation & Transition OOB: Pt requires increased time to transition out of bed. Pt requires use of visual timer set for 5 minutes for pt to then initiate transfer out and pt requires minimal prompts to initiate this. Pt demo's no behavioral responses and responds well to use of the the timer to assist with transition instead of only giving verbal prompts. Pt able to perform supine <> sit transfers with close supervision for safety.   Pt returned to bed at end of session. Pt left resting comfortably in bed with personal belongings and call light within reach, bed alarm on and activated, bed in low position, 3 bed rails up, and comfort needs attended to.   Therapy/Group: Individual Therapy  Lavera Guise 05/30/2022, 4:29 PM

## 2022-05-30 NOTE — Progress Notes (Signed)
Physical Therapy Discharge Summary  Patient Details  Name: Zachary Hill MRN: [id number] Date of Birth: 1981/06/19  Date of Discharge from PT service:May 30, 2022  Today's Date: 05/30/2022   Patient has met 7 of 7 long term goals due to improved activity tolerance, improved balance, improved postural control, increased strength, increased range of motion, decreased pain, ability to compensate for deficits, improved attention, improved awareness, and improved coordination.  Patient to discharge at an ambulatory level Supervision using a RW.   Patient's care partner is independent to provide the necessary physical and cognitive assistance at discharge.  Reasons goals not met: All PT goals met at this time.  Recommendation:  Patient will benefit from ongoing skilled PT services in outpatient setting to continue to advance safe functional mobility, address ongoing impairments in balance, ROM, activity tolerance, functional mobility, gait and stair training, community integration, dual task training, patient/caregiver education, and minimize fall risk.  Equipment: RW  Reasons for discharge: treatment goals met  Patient/family agrees with progress made and goals achieved: Yes  PT Discharge Precautions/Restrictions Precautions Precautions: Fall Precaution Comments: PEG (with abdominal binder covering), unable to fully open R eye, fear/behavioral, impulsive, poor safety awareness Restrictions Weight Bearing Restrictions: No RUE Weight Bearing: Non weight bearing RLE Weight Bearing: Weight bearing as tolerated LLE Weight Bearing: Touchdown weight bearing Pain Interference Pain Interference Pain Effect on Sleep: 1. Rarely or not at all Pain Interference with Therapy Activities: 1. Rarely or not at all Pain Interference with Day-to-Day Activities: 1. Rarely or not at all Vision/Perception  Vision - History Ability to See in Adequate Light: 1 Impaired Vision - Assessment Eye  Alignment: Impaired (comment) Ocular Range of Motion: Within Functional Limits Tracking/Visual Pursuits: Able to track stimulus in all quads without difficulty Saccades: Within functional limits Convergence: Within functional limits Perception Perception: Within Functional Limits Praxis Praxis: Intact  Cognition Overall Cognitive Status: Impaired/Different from baseline Arousal/Alertness: Awake/alert Orientation Level: Oriented to person;Oriented to place;Oriented to situation Year: 2023 Attention: Focused;Sustained Focused Attention: Appears intact Sustained Attention: Impaired Sustained Attention Impairment: Verbal basic;Functional basic Memory: Impaired Memory Impairment: Storage deficit;Decreased recall of new information;Decreased short term memory Decreased Short Term Memory: Verbal basic;Functional basic Awareness: Impaired Awareness Impairment: Intellectual impairment Problem Solving: Impaired Problem Solving Impairment: Verbal basic;Functional basic Behaviors: Impulsive;Confabulation Safety/Judgment: Impaired [place] [employer] Scales of Cognitive Functioning: Automatic, Appropriate Sensation Sensation Light Touch: Appears Intact Coordination Gross Motor Movements are Fluid and Coordinated: Yes Fine Motor Movements are Fluid and Coordinated: Yes Motor  Motor Motor: Abnormal postural alignment and control;Other (comment) Motor - Discharge Observations: Decreased postural control and balance strategies, reduced trunk extension due to adhesions from abdominal scar  Mobility Bed Mobility Bed Mobility: Supine to Sit;Sit to Supine;Rolling Right;Rolling Left Rolling Right: Independent Rolling Left: Independent Supine to Sit: Supervision/Verbal cueing Sit to Supine: Independent Transfers Sit to Stand: Independent Stand to Sit: Supervision/Verbal cueing Stand Pivot Transfers: Supervision/Verbal cueing Stand Pivot Transfer Details: Verbal cues for technique;Verbal cues  for precautions/safety;Tactile cues for sequencing;Tactile cues for initiation Transfer (Assistive device): Rolling walker Locomotion  Gait Ambulation: Yes Gait Assistance: Supervision/Verbal cueing Gait Distance (Feet): 394 Feet Assistive device: Rolling walker Gait Gait: Yes Gait Pattern: Decreased stride length;Decreased trunk rotation;Trunk flexed;Right flexed knee in stance;Left flexed knee in stance Stairs / Additional Locomotion Stairs: Yes Stairs Assistance: Contact Guard/Touching assist Stair Management Technique: Two rails Number of Stairs: 8 Height of Stairs: 6 Curb: Contact Guard/Touching assist Wheelchair Mobility Wheelchair Mobility: No  Trunk/Postural Assessment  Cervical Assessment Cervical Assessment: Exceptions to  WFL (head forward) Thoracic Assessment Thoracic Assessment: Exceptions to [facility] (rounded shoudlers, limited trunk extension) Lumbar Assessment Lumbar Assessment: Exceptions to [facility] (post pelvic tilt) Postural Control Postural Control: Deficits on evaluation (delayed/insuffieicient)  Balance Balance Balance Assessed: Yes Standardized Balance Assessment Standardized Balance Assessment: Berg Balance Test Berg Balance Test Sit to Stand: Able to stand  independently using hands Standing Unsupported: Able to stand 2 minutes with supervision Sitting with Back Unsupported but Feet Supported on Floor or Stool: Able to sit safely and securely 2 minutes Stand to Sit: Controls descent by using hands Transfers: Able to transfer safely, definite need of hands Standing Unsupported with Eyes Closed: Able to stand 10 seconds with supervision Standing Ubsupported with Feet Together: Needs help to attain position but able to stand for 30 seconds with feet together From Standing, Reach Forward with Outstretched Arm: Can reach forward >5 cm safely (2") (tenderness at abdomen d/t recent PEG removal) From Standing Position, Pick up Object from Floor: Unable to pick up  shoe, but reaches 2-5 cm (1-2") from shoe and balances independently From Standing Position, Turn to Look Behind Over each Shoulder: Turn sideways only but maintains balance Turn 360 Degrees: Needs close supervision or verbal cueing Standing Unsupported, Alternately Place Feet on Step/Stool: Able to complete >2 steps/needs minimal assist Standing Unsupported, One Foot in Front: Able to plae foot ahead of the other independently and hold 30 seconds Standing on One Leg: Able to lift leg independently and hold 5-10 seconds Total Score: 34 Static Sitting Balance Static Sitting - Balance Support: No upper extremity supported Static Sitting - Level of Assistance: 7: Independent Dynamic Sitting Balance Dynamic Sitting - Level of Assistance: 6: Modified independent (Device/Increase time) Static Standing Balance Static Standing - Balance Support: No upper extremity supported Static Standing - Level of Assistance: 5: Stand by assistance Dynamic Standing Balance Dynamic Standing - Balance Support: Bilateral upper extremity supported Dynamic Standing - Level of Assistance: 6: Modified independent (Device/Increase time) Extremity Assessment  RUE Assessment RUE Assessment: Within Functional Limits LUE Assessment LUE Assessment: Within Functional Limits RLE Assessment RLE Assessment: Within Functional Limits General Strength Comments: grossly 4 to 5/5 proximal to distal LLE Assessment LLE Assessment: Within Functional Limits General Strength Comments: grossly 4 to 4+/5 proximal to distal   Abhijot Straughter L Ariadna Setter PT, DPT, NCS, CBIS  05/30/2022, 5:42 PM
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
## 2022-05-30 NOTE — Progress Notes (Signed)
Occupational Therapy Discharge Summary  Patient Details  Name: Zachary Hill MRN: [id number] Date of Birth: 10/27/1980  Date of Discharge from OT service:May 30, 2022  Today's Date: 05/30/2022 OT Individual Time: 1035-1128  Pt received in bed requiring extra encouragement to get OOB. Pt with 8/10 pain in PEG removal sight with medication delivered 30 min prior. Pt agreeable to moving with rest PRN for pain management. Pt completes donning shoes with set up. Pt completes all mobility with S overall for furniture transfers in apartment ad outside courtyard. Pt requires cuing for sequence of TTB transfer but completes safely. After going to outside courtyard pt able to negotiate 8 stairs with rest break after 6 with 1 rail to simulate community mobility. Returned to room with pt unable to recall room number after 2 min. Exited session with pt seated in bed, exit alarm on and call light in reach   Patient has met 12 of 12 long term goals due to improved activity tolerance, improved balance, postural control, ability to compensate for deficits, functional use of  RIGHT lower extremity, improved attention, improved awareness, and improved coordination.  Patient to discharge at overall Supervision level.  Patient's care partner is independent to provide the necessary cognitive assistance at discharge.    Reasons goals not met: n/a  Recommendation:  Patient will benefit from ongoing skilled OT services in outpatient setting to continue to advance functional skills in the area of BADL and iADL.  Equipment: TTB  Reasons for discharge: treatment goals met  Patient/family agrees with progress made and goals achieved: Yes  OT Discharge Precautions/Restrictions  Precautions Precautions: Fall Precaution Comments: PEG (with abdominal binder covering), unable to fully open R eye, fear/behavioral, impulsive, poor safety awareness Restrictions Weight Bearing Restrictions: No RUE Weight Bearing:  Non weight bearing RLE Weight Bearing: Weight bearing as tolerated LLE Weight Bearing: Touchdown weight bearing General   Vital Signs Therapy Vitals Temp: 97.7 F (36.5 C) Temp Source: Oral Pulse Rate: 88 Resp: 16 BP: 106/73 Patient Position (if appropriate): Lying Oxygen Therapy SpO2: 100 % O2 Device: Room Air Pain Pain Assessment Pain Scale: 0-10 Pain Score: 0-No pain ADL Supervision with RW Vision Baseline Vision/History: 0 No visual deficits Patient Visual Report: Other (comment) (unable to see out of R eye) Vision Assessment?: Yes Tracking/Visual Pursuits: Able to track stimulus in all quads without difficulty Saccades: Within functional limits Convergence: Within functional limits Perception  Perception: Within Functional Limits Praxis Praxis: Intact Cognition Cognition Overall Cognitive Status: Impaired/Different from baseline Arousal/Alertness: Lethargic Orientation Level: Person;Place;Situation Person: Oriented Place: Oriented Situation: Oriented Memory: Impaired Memory Impairment: Storage deficit;Decreased recall of new information;Decreased short term memory Decreased Short Term Memory: Verbal basic;Functional basic Attention: Focused;Sustained Focused Attention: Appears intact Sustained Attention: Impaired Sustained Attention Impairment: Verbal basic;Functional basic Awareness: Impaired Awareness Impairment: Intellectual impairment Problem Solving: Impaired Problem Solving Impairment: Verbal basic;Functional basic Behaviors: Impulsive;Confabulation Safety/Judgment: Impaired Rancho Los Amigos Scales of Cognitive Functioning: Automatic, Appropriate Brief Interview for Mental Status (BIMS) Repetition of Three Words (First Attempt): 3 Temporal Orientation: Year: Correct Temporal Orientation: Month: Accurate within 5 days Temporal Orientation: Day: Incorrect Recall: "Sock": No, could not recall Recall: "Blue": No, could not recall Recall: "Bed":  No, could not recall BIMS Summary Score: 8 Sensation Sensation Light Touch: Appears Intact Coordination Gross Motor Movements are Fluid and Coordinated: Yes Fine Motor Movements are Fluid and Coordinated: Yes Motor  Motor Motor: Within Functional Limits Mobility  Bed Mobility Bed Mobility: Supine to Sit;Sit to Supine;Rolling Right;Rolling Left Rolling Right: Supervision/verbal cueing   Rolling Left: Supervision/Verbal cueing Supine to Sit: Supervision/Verbal cueing Sit to Supine: Supervision/Verbal cueing Transfers Sit to Stand: Supervision/Verbal cueing Stand to Sit: Supervision/Verbal cueing  Trunk/Postural Assessment  Cervical Assessment Cervical Assessment: Within Functional Limits Thoracic Assessment Thoracic Assessment: Within Functional Limits Lumbar Assessment Lumbar Assessment: Within Functional Limits Postural Control Postural Control: Deficits on evaluation (delayed)  Balance Balance Balance Assessed: Yes Static Sitting Balance Static Sitting - Balance Support: No upper extremity supported Static Sitting - Level of Assistance: 7: Independent Dynamic Sitting Balance Dynamic Sitting - Level of Assistance: 6: Modified independent (Device/Increase time) Static Standing Balance Static Standing - Level of Assistance: 6: Modified independent (Device/Increase time) Dynamic Standing Balance Dynamic Standing - Level of Assistance: 6: Modified independent (Device/Increase time) Extremity/Trunk Assessment RUE Assessment RUE Assessment: Within Functional Limits LUE Assessment LUE Assessment: Within Functional Limits    M  05/30/2022, 3:52 PM 

## 2022-05-31 DIAGNOSIS — Z21 Asymptomatic human immunodeficiency virus [HIV] infection status: Secondary | ICD-10-CM | POA: Diagnosis not present

## 2022-05-31 DIAGNOSIS — R1312 Dysphagia, oropharyngeal phase: Secondary | ICD-10-CM | POA: Diagnosis not present

## 2022-05-31 DIAGNOSIS — S069X3S Unspecified intracranial injury with loss of consciousness of 1 hour to 5 hours 59 minutes, sequela: Secondary | ICD-10-CM | POA: Diagnosis not present

## 2022-05-31 DIAGNOSIS — E119 Type 2 diabetes mellitus without complications: Secondary | ICD-10-CM | POA: Diagnosis not present

## 2022-05-31 LAB — GLUCOSE, CAPILLARY
Glucose-Capillary: 100 mg/dL — ABNORMAL HIGH (ref 70–99)
Glucose-Capillary: 105 mg/dL — ABNORMAL HIGH (ref 70–99)

## 2022-05-31 MED ORDER — DILTIAZEM HCL 90 MG PO TABS: 90.0000 mg | ORAL_TABLET | Freq: Two times a day (BID) | ORAL | 0 refills | Status: AC

## 2022-05-31 MED ORDER — PREDNISOLONE ACETATE 1 % OP SUSP: 1.0000 [drp] | Freq: Four times a day (QID) | OPHTHALMIC | 1 refills | Status: AC

## 2022-05-31 MED ORDER — ACETAMINOPHEN 325 MG PO TABS: 325.0000 mg | ORAL_TABLET | ORAL | Status: AC | PRN

## 2022-05-31 MED ORDER — CIPROFLOXACIN HCL 0.3 % OP SOLN: 1.0000 [drp] | Freq: Four times a day (QID) | OPHTHALMIC | 1 refills | Status: AC

## 2022-05-31 MED ORDER — ERYTHROMYCIN 5 MG/GM OP OINT: TOPICAL_OINTMENT | Freq: Two times a day (BID) | OPHTHALMIC | 1 refills | Status: AC

## 2022-05-31 MED ORDER — FAMOTIDINE 20 MG PO TABS
20.0000 mg | ORAL_TABLET | Freq: Every day | ORAL | 0 refills | Status: AC
Start: 2022-06-01 — End: ?

## 2022-05-31 MED ORDER — TAB-A-VITE/IRON PO TABS: 1.0000 | ORAL_TABLET | Freq: Every day | ORAL | 0 refills | Status: AC

## 2022-05-31 MED ORDER — ATORVASTATIN CALCIUM 40 MG PO TABS: 40.0000 mg | ORAL_TABLET | Freq: Every day | ORAL | 0 refills | Status: AC

## 2022-05-31 MED ORDER — SERTRALINE HCL 25 MG PO TABS: 25.0000 mg | ORAL_TABLET | Freq: Every day | ORAL | 0 refills | Status: AC

## 2022-05-31 MED ORDER — METHYLPHENIDATE HCL 10 MG PO TABS: 10.0000 mg | ORAL_TABLET | Freq: Two times a day (BID) | ORAL | 0 refills | Status: DC

## 2022-05-31 MED ORDER — ACETAMINOPHEN 325 MG PO TABS: 325.0000 mg | ORAL_TABLET | ORAL | Status: DC | PRN

## 2022-05-31 MED ORDER — OLANZAPINE 10 MG PO TABS: 10.0000 mg | ORAL_TABLET | Freq: Every day | ORAL | 0 refills | Status: AC

## 2022-05-31 MED ORDER — METOPROLOL TARTRATE 25 MG PO TABS: 25.0000 mg | ORAL_TABLET | Freq: Two times a day (BID) | ORAL | 0 refills | Status: AC

## 2022-05-31 MED ORDER — FOLIC ACID 1 MG PO TABS: 1.0000 mg | ORAL_TABLET | Freq: Every day | ORAL | 0 refills | Status: AC

## 2022-05-31 MED ORDER — MAGNESIUM OXIDE -MG SUPPLEMENT 400 (240 MG) MG PO TABS: 400.0000 mg | ORAL_TABLET | Freq: Every day | ORAL | 0 refills | Status: AC

## 2022-05-31 MED ORDER — ATROPINE SULFATE 1 % OP SOLN: 1.0000 [drp] | Freq: Two times a day (BID) | OPHTHALMIC | 1 refills | Status: AC

## 2022-05-31 NOTE — Progress Notes (Signed)
Patient discharged off of unit with all belongings. Discharge papers/instructions explained by physician assistant to family. Patient and family have no further questions at time of discharge. No complications noted at this time.  Zachary Hill Zachary Hill  

## 2022-05-31 NOTE — Progress Notes (Signed)
Inpatient Rehabilitation Care Coordinator Discharge Note   Patient Details  Name: Zachary Hill MRN: [id number] Date of Birth: October 23, 1980   Discharge location: D/c to home with finace  Length of Stay: 21 days  Discharge activity level: Supervision with RW  Home/community participation: Limited  Patient response NL:GXQJJH Literacy - How often do you need to have someone help you when you read instructions, pamphlets, or other written material from your doctor or pharmacy?: Never  Patient response ER:DEYCXK Isolation - How often do you feel lonely or isolated from those around you?: Patient unable to respond  Services provided included: MD, RD, PT, OT, CM, Pharmacy, Neuropsych, SW, TR, RN, SLP  Financial Services:  Financial Services Utilized: Medicare (Medicaid)    Choices offered to/list presented to: Yes  Follow-up services arranged:  Outpatient, Patient/Family request agency HH/DME, DME    Outpatient Servicies: [facility] for outpatient PT/OT/SLP DME : [employer] for incontinence supplies (briefs, chux, gloves, etc) HH/DME Requested Agency: [facility]  Patient response to transportation need: Is the patient able to respond to transportation needs?: Yes In the past 12 months, has lack of transportation kept you from medical appointments or from getting medications?: No In the past 12 months, has lack of transportation kept you from meetings, work, or from getting things needed for daily living?: No   Comments (or additional information):Referral made to [facility] CAP/DA program and pt fiance waiting on packet from [place].   Patient/Family verbalized understanding of follow-up arrangements:  Yes  Individual responsible for coordination of the follow-up plan: contact pt fiance Zachary Hill  Confirmed correct DME delivered: Zachary Hill 05/31/2022    Zachary Hill
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
## 2022-05-31 NOTE — Progress Notes (Signed)
Inpatient Rehabilitation Discharge Medication Review by a Pharmacist  A complete drug regimen review was completed for this patient to identify any potential clinically significant medication issues.  High Risk Drug Classes Is patient taking? Indication by Medication  Antipsychotic Yes Zyprexa - agitation, TBI  Anticoagulant No   Antibiotic Yes Prezista, Genvoya, Juluca - HIV regimen  Opioid No   Antiplatelet No   Hypoglycemics/insulin Yes Jardiance, metformin, Ozempic - DM  Vasoactive Medication No Diltiazem, metoprolol- BP, HR  Chemotherapy No   Other Yes Atorvastatin - HLD Sertraline - Mood Famotidine - Reflux Methylphenidate - attention  Atropine, Erythromycin, Prednisolone - eye infection     Type of Medication Issue Identified Description of Issue Recommendation(s)  Drug Interaction(s) (clinically significant)     Duplicate Therapy     Allergy     No Medication Administration End Date     Incorrect Dose     Additional Drug Therapy Needed     Significant med changes from prior encounter (inform family/care partners about these prior to discharge).    Other       Clinically significant medication issues were identified that warrant physician communication and completion of prescribed/recommended actions by midnight of the next day:  No  Time spent performing this drug regimen review (minutes): 30 minutes  Thank you Okey Regal, PharmD 05/31/2022 10:25 AM

## 2022-06-12 ENCOUNTER — Encounter
Payer: Medicare Other | Attending: Physical Medicine and Rehabilitation | Admitting: Physical Medicine and Rehabilitation

## 2022-06-23 ENCOUNTER — Other Ambulatory Visit: Payer: Self-pay | Admitting: Physician Assistant

## 2022-07-06 ENCOUNTER — Ambulatory Visit: Payer: Medicare Other | Attending: Physician Assistant | Admitting: Occupational Therapy

## 2022-07-06 ENCOUNTER — Other Ambulatory Visit: Payer: Self-pay

## 2022-07-06 ENCOUNTER — Ambulatory Visit: Payer: Medicare Other

## 2022-07-06 DIAGNOSIS — R262 Difficulty in walking, not elsewhere classified: Secondary | ICD-10-CM | POA: Diagnosis present

## 2022-07-06 DIAGNOSIS — R2689 Other abnormalities of gait and mobility: Secondary | ICD-10-CM | POA: Insufficient documentation

## 2022-07-06 DIAGNOSIS — M6281 Muscle weakness (generalized): Secondary | ICD-10-CM | POA: Diagnosis present

## 2022-07-06 DIAGNOSIS — R41841 Cognitive communication deficit: Secondary | ICD-10-CM | POA: Diagnosis present

## 2022-07-06 DIAGNOSIS — R2681 Unsteadiness on feet: Secondary | ICD-10-CM | POA: Insufficient documentation

## 2022-07-06 DIAGNOSIS — R41840 Attention and concentration deficit: Secondary | ICD-10-CM | POA: Diagnosis present

## 2022-07-06 DIAGNOSIS — H5410 Blindness, one eye, low vision other eye, unspecified eyes: Secondary | ICD-10-CM | POA: Diagnosis present
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
## 2022-07-12 ENCOUNTER — Ambulatory Visit: Payer: Medicare Other

## 2022-07-12 ENCOUNTER — Ambulatory Visit: Payer: Medicare Other | Admitting: Occupational Therapy

## 2022-07-12 DIAGNOSIS — R2681 Unsteadiness on feet: Secondary | ICD-10-CM

## 2022-07-12 DIAGNOSIS — M6281 Muscle weakness (generalized): Secondary | ICD-10-CM | POA: Diagnosis not present

## 2022-07-12 DIAGNOSIS — R2689 Other abnormalities of gait and mobility: Secondary | ICD-10-CM

## 2022-07-12 DIAGNOSIS — R262 Difficulty in walking, not elsewhere classified: Secondary | ICD-10-CM

## 2022-07-12 DIAGNOSIS — R41840 Attention and concentration deficit: Secondary | ICD-10-CM
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
## 2022-07-13 ENCOUNTER — Other Ambulatory Visit: Payer: Self-pay

## 2022-07-13 ENCOUNTER — Ambulatory Visit: Payer: Medicare Other

## 2022-07-13 DIAGNOSIS — R41841 Cognitive communication deficit: Secondary | ICD-10-CM

## 2022-07-13 DIAGNOSIS — M6281 Muscle weakness (generalized): Secondary | ICD-10-CM | POA: Diagnosis not present
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
## 2022-07-14 ENCOUNTER — Ambulatory Visit: Payer: Medicare Other | Admitting: Occupational Therapy

## 2022-07-14 ENCOUNTER — Encounter: Payer: Self-pay | Admitting: Physical Therapy

## 2022-07-14 ENCOUNTER — Ambulatory Visit: Payer: Medicare Other | Admitting: Physical Therapy

## 2022-07-14 DIAGNOSIS — H5410 Blindness, one eye, low vision other eye, unspecified eyes: Secondary | ICD-10-CM

## 2022-07-14 DIAGNOSIS — M6281 Muscle weakness (generalized): Secondary | ICD-10-CM

## 2022-07-14 DIAGNOSIS — R262 Difficulty in walking, not elsewhere classified: Secondary | ICD-10-CM

## 2022-07-14 DIAGNOSIS — R2681 Unsteadiness on feet: Secondary | ICD-10-CM

## 2022-07-14 DIAGNOSIS — R41840 Attention and concentration deficit: Secondary | ICD-10-CM

## 2022-07-14 DIAGNOSIS — R2689 Other abnormalities of gait and mobility: Secondary | ICD-10-CM
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
## 2022-07-19 ENCOUNTER — Ambulatory Visit: Payer: Medicare Other

## 2022-07-19 ENCOUNTER — Ambulatory Visit: Payer: Medicare Other | Admitting: Occupational Therapy

## 2022-07-19 ENCOUNTER — Encounter: Payer: Medicare Other | Attending: Physical Medicine and Rehabilitation | Admitting: Physical Medicine & Rehabilitation

## 2022-07-19 DIAGNOSIS — R2689 Other abnormalities of gait and mobility: Secondary | ICD-10-CM

## 2022-07-19 DIAGNOSIS — R262 Difficulty in walking, not elsewhere classified: Secondary | ICD-10-CM

## 2022-07-19 DIAGNOSIS — R41840 Attention and concentration deficit: Secondary | ICD-10-CM

## 2022-07-19 DIAGNOSIS — H5410 Blindness, one eye, low vision other eye, unspecified eyes: Secondary | ICD-10-CM

## 2022-07-19 DIAGNOSIS — R2681 Unsteadiness on feet: Secondary | ICD-10-CM

## 2022-07-19 DIAGNOSIS — M6281 Muscle weakness (generalized): Secondary | ICD-10-CM
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
## 2022-07-19 NOTE — Therapy (Signed)
OUTPATIENT PHYSICAL THERAPY NEURO TREATMENT   Patient Name: Zachary Hill MRN: [id number] DOB:11-01-1980, 41 y.o., male Today's Date: 07/19/2022   PCP: none listed REFERRING PROVIDER: Ranelle Oyster, MD    PT End of Session - 07/19/22 1618     Visit Number 4    Number of Visits 12    Date for PT Re-Evaluation 08/17/22    Authorization Type Medicare A&B/[place] Access 2023    Authorization Time Period 27 VL combined    Authorization - Visit Number 4    Authorization - Number of Visits 27    Progress Note Due on Visit 10    PT Start Time 1615    PT Stop Time 1700    PT Time Calculation (min) 45 min    Equipment Utilized During Treatment Gait belt    Activity Tolerance Patient tolerated treatment well;Patient limited by fatigue    Behavior During Therapy WFL for tasks assessed/performed              Past Medical History:  Diagnosis Date   Acute renal failure (HCC)    Anemia    Diabetes mellitus without complication (HCC)    Hepatitis B    03/21/12 - sAg neg, sAb pos, cAb pos, indication prior infection   HIV disease (HCC)    CD4 = 10, VL = 475K, 03/21/12.  Medication non-compliance   Past Surgical History:  Procedure Laterality Date   COLONOSCOPY  05/15/2012   Procedure: COLONOSCOPY;  Surgeon: Shirley Friar, MD;  Location: [facility] ENDOSCOPY;  Service: Endoscopy;  Laterality: N/A;  unprepped   ESOPHAGOGASTRODUODENOSCOPY  05/13/2012   Procedure: ESOPHAGOGASTRODUODENOSCOPY (EGD);  Surgeon: Shirley Friar, MD;  Location: [facility] ENDOSCOPY;  Service: Endoscopy;  Laterality: N/A;   IR GASTROSTOMY TUBE MOD SED  04/24/2022   Patient Active Problem List   Diagnosis Date Noted   TBI (traumatic brain injury) (HCC) 05/09/2022   Acute on chronic respiratory failure with hypoxia (HCC) 04/10/2022   Diffuse traumatic brain injury with LOC of 6 hours to 24 hours, sequela (HCC) 04/10/2022   Tracheostomy status (HCC) 04/10/2022   Healthcare maintenance 05/07/2019   Type 2  diabetes mellitus with hyperglycemia, without long-term current use of insulin (HCC) 01/09/2019   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   Dyspnea 05/20/2012   Acute renal failure (HCC)    Anemia    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) 05/18/2012   Diarrhea 05/14/2012   Pharyngitis 05/14/2012   Acute kidney injury (HCC) 05/14/2012   Thrombocytopenia (HCC) 05/14/2012   Odynophagia 05/13/2012   Fever 05/11/2012   Nausea & vomiting 05/11/2012   Candidal esophagitis (HCC) 04/29/2012   Asymptomatic human immunodeficiency virus (HIV) infection status (HCC) 03/29/2012   Human papillomavirus in conditions classified elsewhere and of unspecified site 03/29/2012    ONSET DATE: 03/08/22  REFERRING DIAG: T24.9X3S (ICD-10-CM) - Traumatic brain injury, with loss of consciousness of 1 hour to 5 hours 59 minutes, sequela (HCC)   THERAPY DIAG:  Muscle weakness (generalized)  Unsteadiness on feet  Other abnormalities of gait and mobility  Difficulty in walking, not elsewhere classified  Rationale for Evaluation and Treatment Rehabilitation  SUBJECTIVE:  SUBJECTIVE STATEMENT: Doing pretty good   Pt accompanied by: significant other  PERTINENT HISTORY: Dysphagia Syphilis Neurosyphilis HIV positivity Diabetes mellitus (pre-diabetes) Paroxysmal tachycardia Hypertension Urinary retention Right globe rupture Anemia  PAIN:  Are you having pain? Yes: NPRS scale: 4/10 Pain location: R thigh Pain description: achey  Aggravating factors: being still Relieving factors: movement Notes his low back has been having discomfort towards end of day  PRECAUTIONS: None, WBAT RLE  WEIGHT BEARING RESTRICTIONS No  FALLS: Has patient fallen in last 6 months? No  LIVING  ENVIRONMENT: Lives with: lives with their spouse Lives in: House/apartment Stairs: Yes: External: a few steps; on left going up Has following equipment at home: None  PLOF: Independent  PATIENT GOALS Be able to put pants on standing up (can't balance on one leg)  OBJECTIVE:    TODAY'S TREATMENT: 07/19/22 Activity Comments  LAQ 3x10 3#   Sidestepping at counter x 2 min 3#  Standing hamstring curls 3x10 3#, notes bilat feet cramping/pain with single leg stance  Bridge w/ red loop 2x10, 3 sec hold ---notes some anterior left knee pain. Switched to heel bridges  Sidelying clamshells   Prone hip extension with bent knee 1x10--attempted with assist but notes retropatellar pain  Prone quad stretch X 2 min, poorly tolerated      TODAY'S TREATMENT: 07/14/22 Activity Comments  Scifit L2.0 Ues/Les x 2.5 min, L1 x 2.5 min Cueing to reduce pace to improve fatigue; intermittent rest breaks   Bridge 10x Limited amplitude with subsequent   Bridge red TB 10x  Cueing to reduce pace and increase amplitude   SL hip abduction R/L 10x Cueing to maintain proper alignment and avoid hip flexion   sidestepping red TB 4x64ft R lateral trunk lean; CGA  backwards walking 4x40 ft Cueing to correct narrow BOS and R lean  gait train with SP and quad tip cane ~300 ft Good step length; cueing for increased step height d/t heels sliding on floor; pt preferred quad tip       PATIENT EDUCATION: Education details: edu on purchasing SPC and quad tip; advised to wear more supportive shoes for exercise Person educated: Patient Education method: Explanation, Demonstration, Tactile cues, Verbal cues, and Handouts Education comprehension: verbalized understanding and returned demonstration     HOME EXERCISE PROGRAM: Access Code: [email or web address] URL: [email or web address] Date: 07/06/2022 Prepared by: Shary Decamp  Exercises - Clamshell  - 1 x daily - 7 x weekly - 3 sets - 10 reps - Prone Hip  Extension with Bent Knee - One Pillow  - 1 x daily - 7 x weekly - 3 sets - 10 reps - Side Stepping with Resistance at Thighs  - 1 x daily - 7 x weekly - 3 sets - 10 reps - Sit to Stand with Resistance Around Legs  - 1 x daily - 7 x weekly - 5 sets - 5 reps   Below measures were taken at time of initial evaluation unless otherwise specified:   DIAGNOSTIC FINDINGS: Cranial imaging revealed multi-compartmental intracerebral hemorrhage and neurosurgery was consulted for placement of intracranial pressure monitor. Other injuries included right globe rupture, right orbital fractures, right zygomatic arch fracture, left 9th rib fracture, right femoral fracture   COGNITION: Overall cognitive status:  2/3 short term memory recall   SENSATION: WFL  COORDINATION: WFL  EDEMA:  none  MUSCLE TONE: WNL   MUSCLE LENGTH: decreased right quad flexibility +Ely's   DTRs:  Patella 2+ = Normal and Achilles 2+ = Normal  POSTURE:  No Significant postural limitations  LOWER EXTREMITY ROM:     ROM WNL  LOWER EXTREMITY MMT:    MMT Right Eval Left Eval  Hip flexion 3 5  Hip extension 2+ 5  Hip abduction 2+ 4  Hip adduction    Hip internal rotation    Hip external rotation    Knee flexion 4 5  Knee extension 4 5  Ankle dorsiflexion 5 5  Ankle plantarflexion    Ankle inversion    Ankle eversion    (Blank rows = not tested)  BED MOBILITY:  independent  TRANSFERS: Assistive device utilized: None  Sit to stand: Complete Independence Stand to sit: Complete Independence Chair to chair: Complete Independence Floor:  DNT  RAMP:  DNT  CURB:  Level of Assistance: Modified independence Assistive device utilized: None Curb Comments:   STAIRS:  Level of Assistance: Modified independence  Stair Negotiation Technique: Alternating Pattern  with Single Rail on Left  Number of Stairs: 10   Height of Stairs: 4-6"  Comments: increased right lateral hip discomfort  GAIT: Gait pattern:  trendelenburg and lateral lean- Right compensated Distance walked: community Assistive device utilized: None Level of assistance: Complete Independence Comments: pronounced deficit from lack of glute med stability  FUNCTIONAL TESTs:  5 times sit to stand: 13.31 sec Berg Balance Scale: 56/56 Dynamic Gait Index: 15/24 Functional gait assessment: NT  M-CTSIB  Condition 1: Firm Surface, EO 30 Sec, Normal Sway  Condition 2: Firm Surface, EC 30 Sec, Normal Sway  Condition 3: Foam Surface, EO 30 Sec, Normal Sway  Condition 4: Foam Surface, EC 30 Sec, Mild Sway     PATIENT SURVEYS:    TODAY'S TREATMENT:  Evaluation, HEP initiated, gait train w/ cane   PATIENT EDUCATION: Education details: regarding obtaining cane Person educated: Patient and Spouse Education method: Explanation Education comprehension: verbalized understanding      GOALS: Goals reviewed with patient? Yes  SHORT TERM GOALS: Target date: 07/27/2022  Patient will be independent in HEP to improve functional outcomes Baseline: Goal status: IN PROGRESS  2.  Demo ability to ambulate x 10 min w/ least restrictive AD without gait deviation in order to improve safety and efficiency of gait for community ambulation Baseline: no AD, right lateral lean/compensated Trendelenberg Goal status: IN PROGRESS    LONG TERM GOALS: Target date: 08/17/2022  Demo right hip abductor and hip extensor strength to 4-/5 to facilitate stable gait kinematics without deviation Baseline: 2+/5 w/ right lateral lean in stance phase Goal status: IN PROGRESS  2.  Demo improved safety and balance per score 24/24 Dynamic Gait Index Baseline: 15/24 Goal status: IN PROGRESS  3.  Manifest improved balance, strength, and coordination per ability to don/doff pants while standing Baseline: required to sit for lower body dressing Goal status: IN PROGRESS  4.  Demonstrate/report ability to ambulate x 30 min with gait deviations Baseline:  unable Goal status: IN PROGRESS    ASSESSMENT:  CLINICAL IMPRESSION: Decreased activity tolerance today with report of retro-patellar pain on right knee which may be related to his significant gait deviation.  Attempted gentle exercises with emphasis on hip strength but with increased aggravation of knee exhibited.  May need to modify plan to return to more basic isometric activities to improve stability and recruitment vs such active and resisted movement.   OBJECTIVE IMPAIRMENTS Abnormal gait, decreased activity tolerance, decreased balance, difficulty walking, decreased strength, impaired flexibility, and pain.   ACTIVITY LIMITATIONS carrying, lifting, squatting, stairs, dressing, and locomotion level  PARTICIPATION LIMITATIONS: cleaning, shopping, community activity, occupation, and yard work  PERSONAL FACTORS 1 comorbidity: PMH  are also affecting patient's functional outcome.   REHAB POTENTIAL: Excellent  CLINICAL DECISION MAKING: Stable/uncomplicated  EVALUATION COMPLEXITY: Low  PLAN: PT FREQUENCY: 1-2x/week  PT DURATION: 6 weeks  PLANNED INTERVENTIONS: Therapeutic exercises, Therapeutic activity, Neuromuscular re-education, Balance training, Gait training, Patient/Family education, Self Care, Joint mobilization, Stair training, DME instructions, Aquatic Therapy, Dry Needling, Electrical stimulation, Cryotherapy, Moist heat, Taping, and Manual therapy  PLAN FOR NEXT SESSION: re-assess right knee pain, initiate hip/knee isometrics vs continued POC?   4:58 PM, 07/19/22 M. Shary Decamp, PT, DPT Physical Therapist- [place] Office Number: [phone]   [facility] [address], [address] [place], [place] [address] Phone # [phone] Fax # [phone]

## 2022-07-21 ENCOUNTER — Ambulatory Visit: Payer: Medicare Other | Admitting: Physical Therapy

## 2022-07-21 ENCOUNTER — Ambulatory Visit: Payer: Medicare Other | Admitting: Occupational Therapy

## 2022-07-21 DIAGNOSIS — R2681 Unsteadiness on feet: Secondary | ICD-10-CM

## 2022-07-21 DIAGNOSIS — R41840 Attention and concentration deficit: Secondary | ICD-10-CM

## 2022-07-21 DIAGNOSIS — M6281 Muscle weakness (generalized): Secondary | ICD-10-CM | POA: Diagnosis not present

## 2022-07-21 DIAGNOSIS — H5410 Blindness, one eye, low vision other eye, unspecified eyes: Secondary | ICD-10-CM
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
## 2022-07-24 ENCOUNTER — Ambulatory Visit: Payer: Medicare Other | Admitting: Physical Therapy

## 2022-07-24 NOTE — Therapy (Incomplete)
OUTPATIENT PHYSICAL THERAPY NEURO TREATMENT   Patient Name: Zachary Hill MRN: [id number] DOB:01-25-1981, 41 y.o., male Today's Date: 07/24/2022   PCP: none listed REFERRING PROVIDER: Ranelle Oyster, MD       Past Medical History:  Diagnosis Date   Acute renal failure (HCC)    Anemia    Diabetes mellitus without complication (HCC)    Hepatitis B    03/21/12 - sAg neg, sAb pos, cAb pos, indication prior infection   HIV disease (HCC)    CD4 = 10, VL = 475K, 03/21/12.  Medication non-compliance   Past Surgical History:  Procedure Laterality Date   COLONOSCOPY  05/15/2012   Procedure: COLONOSCOPY;  Surgeon: Shirley Friar, MD;  Location: [facility] ENDOSCOPY;  Service: Endoscopy;  Laterality: N/A;  unprepped   ESOPHAGOGASTRODUODENOSCOPY  05/13/2012   Procedure: ESOPHAGOGASTRODUODENOSCOPY (EGD);  Surgeon: Shirley Friar, MD;  Location: [facility] ENDOSCOPY;  Service: Endoscopy;  Laterality: N/A;   IR GASTROSTOMY TUBE MOD SED  04/24/2022   Patient Active Problem List   Diagnosis Date Noted   TBI (traumatic brain injury) (HCC) 05/09/2022   Acute on chronic respiratory failure with hypoxia (HCC) 04/10/2022   Diffuse traumatic brain injury with LOC of 6 hours to 24 hours, sequela (HCC) 04/10/2022   Tracheostomy status (HCC) 04/10/2022   Healthcare maintenance 05/07/2019   Type 2 diabetes mellitus with hyperglycemia, without long-term current use of insulin (HCC) 01/09/2019   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   Dyspnea 05/20/2012   Acute renal failure (HCC)    Anemia    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) 05/18/2012   Diarrhea 05/14/2012   Pharyngitis 05/14/2012   Acute kidney injury (HCC) 05/14/2012   Thrombocytopenia (HCC) 05/14/2012   Odynophagia 05/13/2012   Fever 05/11/2012   Nausea & vomiting 05/11/2012   Candidal esophagitis (HCC) 04/29/2012   Asymptomatic human immunodeficiency virus (HIV) infection status (HCC) 03/29/2012   Human  papillomavirus in conditions classified elsewhere and of unspecified site 03/29/2012    ONSET DATE: 03/08/22  REFERRING DIAG: Z32.9X3S (ICD-10-CM) - Traumatic brain injury, with loss of consciousness of 1 hour to 5 hours 59 minutes, sequela (HCC)   THERAPY DIAG:  No diagnosis found.  Rationale for Evaluation and Treatment Rehabilitation  SUBJECTIVE:                                                                                                                                                                                              SUBJECTIVE STATEMENT: ***Doing pretty good   Pt accompanied by: significant other  PERTINENT HISTORY: Dysphagia Syphilis Neurosyphilis HIV positivity Diabetes  mellitus (pre-diabetes) Paroxysmal tachycardia Hypertension Urinary retention Right globe rupture Anemia  PAIN:  Are you having pain? Yes: NPRS scale: 4/10 Pain location: R thigh Pain description: achey  Aggravating factors: being still Relieving factors: movement Notes his low back has been having discomfort towards end of day  PRECAUTIONS: None, WBAT RLE  WEIGHT BEARING RESTRICTIONS No  FALLS: Has patient fallen in last 6 months? No  LIVING ENVIRONMENT: Lives with: lives with their spouse Lives in: House/apartment Stairs: Yes: External: a few steps; on left going up Has following equipment at home: None  PLOF: Independent  PATIENT GOALS Be able to put pants on standing up (can't balance on one leg)  OBJECTIVE:    TODAY'S TREATMENT: 07/26/2022 Activity Comments                       TODAY'S TREATMENT: 07/19/22 Activity Comments  LAQ 3x10 3#   Sidestepping at counter x 2 min 3#  Standing hamstring curls 3x10 3#, notes bilat feet cramping/pain with single leg stance  Bridge w/ red loop 2x10, 3 sec hold ---notes some anterior left knee pain. Switched to heel bridges  Sidelying clamshells   Prone hip extension with bent knee 1x10--attempted with assist but  notes retropatellar pain  Prone quad stretch X 2 min, poorly tolerated      TODAY'S TREATMENT: 07/14/22 Activity Comments  Scifit L2.0 Ues/Les x 2.5 min, L1 x 2.5 min Cueing to reduce pace to improve fatigue; intermittent rest breaks   Bridge 10x Limited amplitude with subsequent   Bridge red TB 10x  Cueing to reduce pace and increase amplitude   SL hip abduction R/L 10x Cueing to maintain proper alignment and avoid hip flexion   sidestepping red TB 4x79ft R lateral trunk lean; CGA  backwards walking 4x40 ft Cueing to correct narrow BOS and R lean  gait train with SP and quad tip cane ~300 ft Good step length; cueing for increased step height d/t heels sliding on floor; pt preferred quad tip       PATIENT EDUCATION: Education details: edu on purchasing SPC and quad tip; advised to wear more supportive shoes for exercise Person educated: Patient Education method: Explanation, Demonstration, Tactile cues, Verbal cues, and Handouts Education comprehension: verbalized understanding and returned demonstration     HOME EXERCISE PROGRAM: Access Code: [email or web address] URL: [email or web address] Date: 07/06/2022 Prepared by: Sherlyn Lees  Exercises - Clamshell  - 1 x daily - 7 x weekly - 3 sets - 10 reps - Prone Hip Extension with Bent Knee - One Pillow  - 1 x daily - 7 x weekly - 3 sets - 10 reps - Side Stepping with Resistance at Thighs  - 1 x daily - 7 x weekly - 3 sets - 10 reps - Sit to Stand with Resistance Around Legs  - 1 x daily - 7 x weekly - 5 sets - 5 reps   Below measures were taken at time of initial evaluation unless otherwise specified:   DIAGNOSTIC FINDINGS: Cranial imaging revealed multi-compartmental intracerebral hemorrhage and neurosurgery was consulted for placement of intracranial pressure monitor. Other injuries included right globe rupture, right orbital fractures, right zygomatic arch fracture, left 9th rib fracture, right femoral fracture    COGNITION: Overall cognitive status:  2/3 short term memory recall   SENSATION: WFL  COORDINATION: WFL  EDEMA:  none  MUSCLE TONE: WNL   MUSCLE LENGTH: decreased right quad flexibility +Ely's   DTRs:  Patella 2+ = Normal and  Achilles 2+ = Normal  POSTURE: No Significant postural limitations  LOWER EXTREMITY ROM:     ROM WNL  LOWER EXTREMITY MMT:    MMT Right Eval Left Eval  Hip flexion 3 5  Hip extension 2+ 5  Hip abduction 2+ 4  Hip adduction    Hip internal rotation    Hip external rotation    Knee flexion 4 5  Knee extension 4 5  Ankle dorsiflexion 5 5  Ankle plantarflexion    Ankle inversion    Ankle eversion    (Blank rows = not tested)  BED MOBILITY:  independent  TRANSFERS: Assistive device utilized: None  Sit to stand: Complete Independence Stand to sit: Complete Independence Chair to chair: Complete Independence Floor:  DNT  RAMP:  DNT  CURB:  Level of Assistance: Modified independence Assistive device utilized: None Curb Comments:   STAIRS:  Level of Assistance: Modified independence  Stair Negotiation Technique: Alternating Pattern  with Single Rail on Left  Number of Stairs: 10   Height of Stairs: 4-6"  Comments: increased right lateral hip discomfort  GAIT: Gait pattern: trendelenburg and lateral lean- Right compensated Distance walked: community Assistive device utilized: None Level of assistance: Complete Independence Comments: pronounced deficit from lack of glute med stability  FUNCTIONAL TESTs:  5 times sit to stand: 13.31 sec Berg Balance Scale: 56/56 Dynamic Gait Index: 15/24 Functional gait assessment: NT  M-CTSIB  Condition 1: Firm Surface, EO 30 Sec, Normal Sway  Condition 2: Firm Surface, EC 30 Sec, Normal Sway  Condition 3: Foam Surface, EO 30 Sec, Normal Sway  Condition 4: Foam Surface, EC 30 Sec, Mild Sway     PATIENT SURVEYS:    TODAY'S TREATMENT:  Evaluation, HEP initiated, gait train w/  cane   PATIENT EDUCATION: Education details: regarding obtaining cane Person educated: Patient and Spouse Education method: Explanation Education comprehension: verbalized understanding      GOALS: Goals reviewed with patient? Yes  SHORT TERM GOALS: Target date: 07/27/2022  Patient will be independent in HEP to improve functional outcomes Baseline: Goal status: IN PROGRESS  2.  Demo ability to ambulate x 10 min w/ least restrictive AD without gait deviation in order to improve safety and efficiency of gait for community ambulation Baseline: no AD, right lateral lean/compensated Trendelenberg Goal status: IN PROGRESS    LONG TERM GOALS: Target date: 08/17/2022  Demo right hip abductor and hip extensor strength to 4-/5 to facilitate stable gait kinematics without deviation Baseline: 2+/5 w/ right lateral lean in stance phase Goal status: IN PROGRESS  2.  Demo improved safety and balance per score 24/24 Dynamic Gait Index Baseline: 15/24 Goal status: IN PROGRESS  3.  Manifest improved balance, strength, and coordination per ability to don/doff pants while standing Baseline: required to sit for lower body dressing Goal status: IN PROGRESS  4.  Demonstrate/report ability to ambulate x 30 min with gait deviations Baseline: unable Goal status: IN PROGRESS    ASSESSMENT:  CLINICAL IMPRESSION: ***Decreased activity tolerance today with report of retro-patellar pain on right knee which may be related to his significant gait deviation.  Attempted gentle exercises with emphasis on hip strength but with increased aggravation of knee exhibited.  May need to modify plan to return to more basic isometric activities to improve stability and recruitment vs such active and resisted movement.   OBJECTIVE IMPAIRMENTS Abnormal gait, decreased activity tolerance, decreased balance, difficulty walking, decreased strength, impaired flexibility, and pain.   ACTIVITY LIMITATIONS  carrying, lifting, squatting,  stairs, dressing, and locomotion level  PARTICIPATION LIMITATIONS: cleaning, shopping, community activity, occupation, and yard work  PERSONAL FACTORS 1 comorbidity: PMH  are also affecting patient's functional outcome.   REHAB POTENTIAL: Excellent  CLINICAL DECISION MAKING: Stable/uncomplicated  EVALUATION COMPLEXITY: Low  PLAN: PT FREQUENCY: 1-2x/week  PT DURATION: 6 weeks  PLANNED INTERVENTIONS: Therapeutic exercises, Therapeutic activity, Neuromuscular re-education, Balance training, Gait training, Patient/Family education, Self Care, Joint mobilization, Stair training, DME instructions, Aquatic Therapy, Dry Needling, Electrical stimulation, Cryotherapy, Moist heat, Taping, and Manual therapy  PLAN FOR NEXT SESSION: ***re-assess right knee pain, initiate hip/knee isometrics vs continued POC?   Lonia Blood, PT 07/24/22 2:35 PM Phone: [phone] Fax: [phone]   [facility] at [facility] [address] [place], [address] [place], [place] [address] Phone # [phone] Fax # [phone]

## 2022-07-25 ENCOUNTER — Ambulatory Visit: Payer: Medicare Other | Admitting: Physical Therapy

## 2022-07-25 DIAGNOSIS — M6281 Muscle weakness (generalized): Secondary | ICD-10-CM | POA: Diagnosis not present

## 2022-07-25 DIAGNOSIS — R2681 Unsteadiness on feet: Secondary | ICD-10-CM

## 2022-07-25 DIAGNOSIS — R2689 Other abnormalities of gait and mobility: Secondary | ICD-10-CM

## 2022-07-25 NOTE — Therapy (Signed)
OUTPATIENT PHYSICAL THERAPY NEURO TREATMENT   Patient Name: Zachary Hill MRN: [id number] DOB:[birth year], 41 y.o., male Today's Date: 07/25/2022   PCP: none listed REFERRING PROVIDER: Ranelle Oyster, MD    PT End of Session - 07/25/22 1619     Visit Number 5    Number of Visits 12    Date for PT Re-Evaluation 08/17/22    Authorization Type Medicare A&B/[place] Access 2023    Authorization Time Period 27 VL combined    Authorization - Visit Number 5    Authorization - Number of Visits 27    Progress Note Due on Visit 10    PT Start Time 1620    PT Stop Time 1658    PT Time Calculation (min) 38 min    Equipment Utilized During Treatment Gait belt    Activity Tolerance Patient tolerated treatment well;Patient limited by fatigue    Behavior During Therapy WFL for tasks assessed/performed               Past Medical History:  Diagnosis Date   Acute renal failure (HCC)    Anemia    Diabetes mellitus without complication (HCC)    Hepatitis B    03/21/12 - sAg neg, sAb pos, cAb pos, indication prior infection   HIV disease (HCC)    CD4 = 10, VL = 475K, 03/21/12.  Medication non-compliance   Past Surgical History:  Procedure Laterality Date   COLONOSCOPY  05/15/2012   Procedure: COLONOSCOPY;  Surgeon: Shirley Friar, MD;  Location: [facility] ENDOSCOPY;  Service: Endoscopy;  Laterality: N/A;  unprepped   ESOPHAGOGASTRODUODENOSCOPY  05/13/2012   Procedure: ESOPHAGOGASTRODUODENOSCOPY (EGD);  Surgeon: Shirley Friar, MD;  Location: [facility] ENDOSCOPY;  Service: Endoscopy;  Laterality: N/A;   IR GASTROSTOMY TUBE MOD SED  04/24/2022   Patient Active Problem List   Diagnosis Date Noted   TBI (traumatic brain injury) (HCC) 05/09/2022   Acute on chronic respiratory failure with hypoxia (HCC) 04/10/2022   Diffuse traumatic brain injury with LOC of 6 hours to 24 hours, sequela (HCC) 04/10/2022   Tracheostomy status (HCC) 04/10/2022   Healthcare maintenance 05/07/2019   Type 2  diabetes mellitus with hyperglycemia, without long-term current use of insulin (HCC) 01/09/2019   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   Dyspnea 05/20/2012   Acute renal failure (HCC)    Anemia    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) 05/18/2012   Diarrhea 05/14/2012   Pharyngitis 05/14/2012   Acute kidney injury (HCC) 05/14/2012   Thrombocytopenia (HCC) 05/14/2012   Odynophagia 05/13/2012   Fever 05/11/2012   Nausea & vomiting 05/11/2012   Candidal esophagitis (HCC) 04/29/2012   Asymptomatic human immunodeficiency virus (HIV) infection status (HCC) 03/29/2012   Human papillomavirus in conditions classified elsewhere and of unspecified site 03/29/2012    ONSET DATE: 03/08/22  REFERRING DIAG: T51.9X3S (ICD-10-CM) - Traumatic brain injury, with loss of consciousness of 1 hour to 5 hours 59 minutes, sequela (HCC)   THERAPY DIAG:  Muscle weakness (generalized)  Unsteadiness on feet  Other abnormalities of gait and mobility  Rationale for Evaluation and Treatment Rehabilitation  SUBJECTIVE:  SUBJECTIVE STATEMENT: Nothing new since last visit.  Knee has bothered me and feet continue to ache.   Pt accompanied by: significant other  PERTINENT HISTORY: Dysphagia Syphilis Neurosyphilis HIV positivity Diabetes mellitus (pre-diabetes) Paroxysmal tachycardia Hypertension Urinary retention Right globe rupture Anemia  PAIN:  Are you having pain? Yes: NPRS scale: 4/10 Pain location: R thigh Pain description: achey, stinging  Aggravating factors: being still Relieving factors: movement, medication Notes his low back has been having discomfort towards end of day  PRECAUTIONS: None, WBAT RLE  WEIGHT BEARING RESTRICTIONS No  FALLS: Has patient fallen in last 6  months? No  LIVING ENVIRONMENT: Lives with: lives with their spouse Lives in: House/apartment Stairs: Yes: External: a few steps; on left going up Has following equipment at home: None  PLOF: Independent  PATIENT GOALS Be able to put pants on standing up (can't balance on one leg)  OBJECTIVE:    TODAY'S TREATMENT: 07/26/2022 Activity Comments  Sidelying clamshells, 2 x 5 reps Cues for technique on RLE to lessen hip flexion initiation  Bridge w/ red loop 2x10, 3 sec hold ---notes some anterior left knee pain. Switched to heel bridges  Hooklying clamshell, red theraband 2 x 10 reps Good form  Attempted supine quad stretch at edge of mat Did not tolerate well; anterior knee pain  Supine quad set, 3 sec, 2 x 5 reps Towel under knee  STanding hip abduction/Extension, 2 x 10 reps, 3# RLE Step taps to side, good posture standing through RLE  Standing hip/knee flexion 2 x 10 reps, 3# RLE, taps to box BUE support>1 UE support, good posture with SLS RLE  Short distance gait in parallel bars, 1 UE support, then in gym, no device Cues for for abdominal activation to aid in taller posture, decreased trunk lean       HOME EXERCISE PROGRAM: Access Code: [email or web address] URL: [email or web address] Date: 07/06/2022 Prepared by: Shary Decamp  Exercises - Clamshell  - 1 x daily - 7 x weekly - 3 sets - 10 reps - Prone Hip Extension with Bent Knee - One Pillow  - 1 x daily - 7 x weekly - 3 sets - 10 reps - Side Stepping with Resistance at Thighs  - 1 x daily - 7 x weekly - 3 sets - 10 reps - Sit to Stand with Resistance Around Legs  - 1 x daily - 7 x weekly - 5 sets - 5 reps   Below measures were taken at time of initial evaluation unless otherwise specified:   DIAGNOSTIC FINDINGS: Cranial imaging revealed multi-compartmental intracerebral hemorrhage and neurosurgery was consulted for placement of intracranial pressure monitor. Other injuries included right globe rupture, right orbital  fractures, right zygomatic arch fracture, left 9th rib fracture, right femoral fracture   COGNITION: Overall cognitive status:  2/3 short term memory recall   SENSATION: WFL  COORDINATION: WFL  EDEMA:  none  MUSCLE TONE: WNL   MUSCLE LENGTH: decreased right quad flexibility +Ely's   DTRs:  Patella 2+ = Normal and Achilles 2+ = Normal  POSTURE: No Significant postural limitations  LOWER EXTREMITY ROM:     ROM WNL  LOWER EXTREMITY MMT:    MMT Right Eval Left Eval  Hip flexion 3 5  Hip extension 2+ 5  Hip abduction 2+ 4  Hip adduction    Hip internal rotation    Hip external rotation    Knee flexion 4 5  Knee extension 4 5  Ankle dorsiflexion 5 5  Ankle plantarflexion  Ankle inversion    Ankle eversion    (Blank rows = not tested)  BED MOBILITY:  independent  TRANSFERS: Assistive device utilized: None  Sit to stand: Complete Independence Stand to sit: Complete Independence Chair to chair: Complete Independence Floor:  DNT  RAMP:  DNT  CURB:  Level of Assistance: Modified independence Assistive device utilized: None Curb Comments:   STAIRS:  Level of Assistance: Modified independence  Stair Negotiation Technique: Alternating Pattern  with Single Rail on Left  Number of Stairs: 10   Height of Stairs: 4-6"  Comments: increased right lateral hip discomfort  GAIT: Gait pattern: trendelenburg and lateral lean- Right compensated Distance walked: community Assistive device utilized: None Level of assistance: Complete Independence Comments: pronounced deficit from lack of glute med stability  FUNCTIONAL TESTs:  5 times sit to stand: 13.31 sec Berg Balance Scale: 56/56 Dynamic Gait Index: 15/24 Functional gait assessment: NT  M-CTSIB  Condition 1: Firm Surface, EO 30 Sec, Normal Sway  Condition 2: Firm Surface, EC 30 Sec, Normal Sway  Condition 3: Foam Surface, EO 30 Sec, Normal Sway  Condition 4: Foam Surface, EC 30 Sec, Mild Sway      PATIENT SURVEYS:    TODAY'S TREATMENT:  Evaluation, HEP initiated, gait train w/ cane   PATIENT EDUCATION: Education details: regarding obtaining cane Person educated: Patient and Spouse Education method: Explanation Education comprehension: verbalized understanding      GOALS: Goals reviewed with patient? Yes  SHORT TERM GOALS: Target date: 07/27/2022  Patient will be independent in HEP to improve functional outcomes Baseline: Goal status: IN PROGRESS  2.  Demo ability to ambulate x 10 min w/ least restrictive AD without gait deviation in order to improve safety and efficiency of gait for community ambulation Baseline: no AD, right lateral lean/compensated Trendelenberg Goal status: IN PROGRESS    LONG TERM GOALS: Target date: 08/17/2022  Demo right hip abductor and hip extensor strength to 4-/5 to facilitate stable gait kinematics without deviation Baseline: 2+/5 w/ right lateral lean in stance phase Goal status: IN PROGRESS  2.  Demo improved safety and balance per score 24/24 Dynamic Gait Index Baseline: 15/24 Goal status: IN PROGRESS  3.  Manifest improved balance, strength, and coordination per ability to don/doff pants while standing Baseline: required to sit for lower body dressing Goal status: IN PROGRESS  4.  Demonstrate/report ability to ambulate x 30 min with gait deviations Baseline: unable Goal status: IN PROGRESS    ASSESSMENT:  CLINICAL IMPRESSION: Continued to work on RLE strengthening in supine, sidelying and standing positions.  With standing isolated hip exercises, including RLE weightbearing, demonstrates good posture and decreased lean through R hip.  Worked on lessening UE support with these activities, with good attention to staying tall through right hip, though he does fatigue at end of 2 sets of exercise. Pt does report decreased R knee pain at end of session.  He will continue to benefit from skilled PT towards goals for improved  functional mobility and decreased fall risk.  OBJECTIVE IMPAIRMENTS Abnormal gait, decreased activity tolerance, decreased balance, difficulty walking, decreased strength, impaired flexibility, and pain.   ACTIVITY LIMITATIONS carrying, lifting, squatting, stairs, dressing, and locomotion level  PARTICIPATION LIMITATIONS: cleaning, shopping, community activity, occupation, and yard work  PERSONAL FACTORS 1 comorbidity: PMH  are also affecting patient's functional outcome.   REHAB POTENTIAL: Excellent  CLINICAL DECISION MAKING: Stable/uncomplicated  EVALUATION COMPLEXITY: Low  PLAN: PT FREQUENCY: 1-2x/week  PT DURATION: 6 weeks  PLANNED INTERVENTIONS: Therapeutic exercises,  Therapeutic activity, Neuromuscular re-education, Balance training, Gait training, Patient/Family education, Self Care, Joint mobilization, Stair training, DME instructions, Aquatic Therapy, Dry Needling, Electrical stimulation, Cryotherapy, Moist heat, Taping, and Manual therapy  PLAN FOR NEXT SESSION: Check STGs (due 07/27/2022).  Continue working on RLE stance strengthening exercises.  Re-assess right knee pain as needed, initiate hip/knee isometrics vs continued POC?   Mady Haagensen, PT 07/25/22 5:10 PM Phone: [phone] Fax: [phone]   [facility] Rehab at [facility], [place], [place] [address] Phone # [phone] Fax # [phone]

## 2022-07-26 ENCOUNTER — Ambulatory Visit: Payer: Medicare Other | Admitting: Occupational Therapy

## 2022-07-26 ENCOUNTER — Ambulatory Visit: Payer: Medicare Other

## 2022-07-26 ENCOUNTER — Encounter: Payer: Self-pay | Admitting: Occupational Therapy

## 2022-07-26 DIAGNOSIS — R2689 Other abnormalities of gait and mobility: Secondary | ICD-10-CM

## 2022-07-26 DIAGNOSIS — R262 Difficulty in walking, not elsewhere classified: Secondary | ICD-10-CM

## 2022-07-26 DIAGNOSIS — M6281 Muscle weakness (generalized): Secondary | ICD-10-CM

## 2022-07-26 DIAGNOSIS — R2681 Unsteadiness on feet: Secondary | ICD-10-CM

## 2022-07-26 DIAGNOSIS — R41840 Attention and concentration deficit: Secondary | ICD-10-CM
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
## 2022-07-26 NOTE — Therapy (Signed)
OUTPATIENT PHYSICAL THERAPY NEURO TREATMENT   Patient Name: Zachary Hill MRN: [id number] DOB:1981-04-10, 41 y.o., male Today's Date: 07/26/2022   PCP: none listed REFERRING PROVIDER: Ranelle Oyster, MD    PT End of Session - 07/26/22 1553     Visit Number 6    Number of Visits 12    Date for PT Re-Evaluation 08/17/22    Authorization Type Medicare A&B/[place] Access 2023    Authorization Time Period 27 VL combined    Authorization - Visit Number 6    Authorization - Number of Visits 27    Progress Note Due on Visit 10    Equipment Utilized During Treatment Gait belt    Activity Tolerance Patient tolerated treatment well;Patient limited by fatigue    Behavior During Therapy [facility] for tasks assessed/performed               Past Medical History:  Diagnosis Date   Acute renal failure (HCC)    Anemia    Diabetes mellitus without complication (HCC)    Hepatitis B    03/21/12 - sAg neg, sAb pos, cAb pos, indication prior infection   HIV disease (HCC)    CD4 = 10, VL = 475K, 03/21/12.  Medication non-compliance   Past Surgical History:  Procedure Laterality Date   COLONOSCOPY  05/15/2012   Procedure: COLONOSCOPY;  Surgeon: Shirley Friar, MD;  Location: [facility] ENDOSCOPY;  Service: Endoscopy;  Laterality: N/A;  unprepped   ESOPHAGOGASTRODUODENOSCOPY  05/13/2012   Procedure: ESOPHAGOGASTRODUODENOSCOPY (EGD);  Surgeon: Shirley Friar, MD;  Location: [facility] ENDOSCOPY;  Service: Endoscopy;  Laterality: N/A;   IR GASTROSTOMY TUBE MOD SED  04/24/2022   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   Dyspnea 05/20/2012   Acute renal failure (HCC)    Anemia    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) 05/18/2012   Diarrhea 05/14/2012   Pharyngitis 05/14/2012   Acute kidney injury (HCC) 05/14/2012   Thrombocytopenia (HCC) 05/14/2012   Odynophagia 05/13/2012   Fever 05/11/2012   Nausea & vomiting 05/11/2012   Candidal esophagitis (HCC) 04/29/2012   Asymptomatic human immunodeficiency virus (HIV) infection status (HCC) 03/29/2012   Human papillomavirus in conditions classified elsewhere and of unspecified site 03/29/2012    ONSET DATE: 03/08/22  REFERRING DIAG: J88.9X3S (ICD-10-CM) - Traumatic brain injury, with loss of consciousness of 1 hour to 5 hours 59 minutes, sequela (HCC)   THERAPY DIAG:  Muscle weakness (generalized)  Unsteadiness on feet  Other abnormalities of gait and mobility  Difficulty in walking, not elsewhere classified  Rationale for Evaluation and Treatment Rehabilitation  SUBJECTIVE:  SUBJECTIVE STATEMENT: Right thigh and under the knee cap continue to hurt and feel very sore Pt accompanied by: significant other  PERTINENT HISTORY: Dysphagia Syphilis Neurosyphilis HIV positivity Diabetes mellitus (pre-diabetes) Paroxysmal tachycardia Hypertension Urinary retention Right globe rupture Anemia  PAIN:  Are you having pain? Yes: NPRS scale: 5/10 Pain location: R thigh/knee cap Pain description: achey, stinging  Aggravating factors: being still Relieving factors: movement, medication Notes his low back has been having discomfort towards end of day  PRECAUTIONS: None, WBAT RLE  WEIGHT BEARING RESTRICTIONS No  FALLS: Has patient fallen in last 6 months? No  LIVING  ENVIRONMENT: Lives with: lives with their spouse Lives in: House/apartment Stairs: Yes: External: a few steps; on left going up Has following equipment at home: None  PLOF: Independent  PATIENT GOALS Be able to put pants on standing up (can't balance on one leg)  OBJECTIVE:   TODAY'S TREATMENT: 07/26/22 Activity Comments  Supine quad set 3x10, 3 sec hold Ice pack on right knee  Heel slides with ball under foot 1x10 Too pain   Seated LE PRE -hip add iso 3x10  -LAQ 3# 3x10 -hip flex 3# (attempted too painful) -hamstring curls 3x10 red t-band -seated hip abd 3x10 red loop              TODAY'S TREATMENT: 07/26/2022 Activity Comments  Sidelying clamshells, 2 x 5 reps Cues for technique on RLE to lessen hip flexion initiation  Bridge w/ red loop 2x10, 3 sec hold ---notes some anterior left knee pain. Switched to heel bridges  Hooklying clamshell, red theraband 2 x 10 reps Good form  Attempted supine quad stretch at edge of mat Did not tolerate well; anterior knee pain  Supine quad set, 3 sec, 2 x 5 reps Towel under knee  STanding hip abduction/Extension, 2 x 10 reps, 3# RLE Step taps to side, good posture standing through RLE  Standing hip/knee flexion 2 x 10 reps, 3# RLE, taps to box BUE support>1 UE support, good posture with SLS RLE  Short distance gait in parallel bars, 1 UE support, then in gym, no device Cues for for abdominal activation to aid in taller posture, decreased trunk lean       HOME EXERCISE PROGRAM: Access Code: [email or web address] URL: [email or web address] Date: 07/06/2022 Prepared by: Shary Decamp  Exercises - Clamshell  - 1 x daily - 7 x weekly - 3 sets - 10 reps - Prone Hip Extension with Bent Knee - One Pillow  - 1 x daily - 7 x weekly - 3 sets - 10 reps - Side Stepping with Resistance at Thighs  - 1 x daily - 7 x weekly - 3 sets - 10 reps - Sit to Stand with Resistance Around Legs  - 1 x daily - 7 x weekly - 5 sets - 5 reps   Below measures  were taken at time of initial evaluation unless otherwise specified:   DIAGNOSTIC FINDINGS: Cranial imaging revealed multi-compartmental intracerebral hemorrhage and neurosurgery was consulted for placement of intracranial pressure monitor. Other injuries included right globe rupture, right orbital fractures, right zygomatic arch fracture, left 9th rib fracture, right femoral fracture   COGNITION: Overall cognitive status:  2/3 short term memory recall   SENSATION: WFL  COORDINATION: WFL  EDEMA:  none  MUSCLE TONE: WNL   MUSCLE LENGTH: decreased right quad flexibility +Ely's   DTRs:  Patella 2+ = Normal and Achilles 2+ = Normal  POSTURE: No Significant postural limitations  LOWER EXTREMITY ROM:  ROM WNL  LOWER EXTREMITY MMT:    MMT Right Eval Left Eval  Hip flexion 3 5  Hip extension 2+ 5  Hip abduction 2+ 4  Hip adduction    Hip internal rotation    Hip external rotation    Knee flexion 4 5  Knee extension 4 5  Ankle dorsiflexion 5 5  Ankle plantarflexion    Ankle inversion    Ankle eversion    (Blank rows = not tested)  BED MOBILITY:  independent  TRANSFERS: Assistive device utilized: None  Sit to stand: Complete Independence Stand to sit: Complete Independence Chair to chair: Complete Independence Floor:  DNT  RAMP:  DNT  CURB:  Level of Assistance: Modified independence Assistive device utilized: None Curb Comments:   STAIRS:  Level of Assistance: Modified independence  Stair Negotiation Technique: Alternating Pattern  with Single Rail on Left  Number of Stairs: 10   Height of Stairs: 4-6"  Comments: increased right lateral hip discomfort  GAIT: Gait pattern: trendelenburg and lateral lean- Right compensated Distance walked: community Assistive device utilized: None Level of assistance: Complete Independence Comments: pronounced deficit from lack of glute med stability  FUNCTIONAL TESTs:  5 times sit to stand: 13.31 sec Berg  Balance Scale: 56/56 Dynamic Gait Index: 15/24 Functional gait assessment: NT  M-CTSIB  Condition 1: Firm Surface, EO 30 Sec, Normal Sway  Condition 2: Firm Surface, EC 30 Sec, Normal Sway  Condition 3: Foam Surface, EO 30 Sec, Normal Sway  Condition 4: Foam Surface, EC 30 Sec, Mild Sway     PATIENT SURVEYS:    TODAY'S TREATMENT:  Evaluation, HEP initiated, gait train w/ cane   PATIENT EDUCATION: Education details: regarding obtaining cane Person educated: Patient and Spouse Education method: Explanation Education comprehension: verbalized understanding      GOALS: Goals reviewed with patient? Yes  SHORT TERM GOALS: Target date: 07/27/2022  Patient will be independent in HEP to improve functional outcomes Baseline: Goal status: IN PROGRESS  2.  Demo ability to ambulate x 10 min w/ least restrictive AD without gait deviation in order to improve safety and efficiency of gait for community ambulation Baseline: no AD, right lateral lean/compensated Trendelenberg Goal status: IN PROGRESS    LONG TERM GOALS: Target date: 08/17/2022  Demo right hip abductor and hip extensor strength to 4-/5 to facilitate stable gait kinematics without deviation Baseline: 2+/5 w/ right lateral lean in stance phase Goal status: IN PROGRESS  2.  Demo improved safety and balance per score 24/24 Dynamic Gait Index Baseline: 15/24 Goal status: IN PROGRESS  3.  Manifest improved balance, strength, and coordination per ability to don/doff pants while standing Baseline: required to sit for lower body dressing Goal status: IN PROGRESS  4.  Demonstrate/report ability to ambulate x 30 min with gait deviations Baseline: unable Goal status: IN PROGRESS    ASSESSMENT:  CLINICAL IMPRESSION: Continued with activities to improve RLE ROM and strength with great limitation and reduced activity tolerance for several activities due to right lateral thigh discomfort and retro-patellar pain.  Activities limited to degree of tolerance and ice applied during stationary activities  OBJECTIVE IMPAIRMENTS Abnormal gait, decreased activity tolerance, decreased balance, difficulty walking, decreased strength, impaired flexibility, and pain.   ACTIVITY LIMITATIONS carrying, lifting, squatting, stairs, dressing, and locomotion level  PARTICIPATION LIMITATIONS: cleaning, shopping, community activity, occupation, and yard work  PERSONAL FACTORS 1 comorbidity: PMH  are also affecting patient's functional outcome.   REHAB POTENTIAL: Excellent  CLINICAL DECISION MAKING: Stable/uncomplicated  EVALUATION COMPLEXITY: Low  PLAN: PT FREQUENCY: 1-2x/week  PT DURATION: 6 weeks  PLANNED INTERVENTIONS: Therapeutic exercises, Therapeutic activity, Neuromuscular re-education, Balance training, Gait training, Patient/Family education, Self Care, Joint mobilization, Stair training, DME instructions, Aquatic Therapy, Dry Needling, Electrical stimulation, Cryotherapy, Moist heat, Taping, and Manual therapy  PLAN FOR NEXT SESSION: Check STGs (due 07/27/2022).  Continue working on RLE stance strengthening exercises.  Re-assess right knee pain as needed, initiate hip/knee isometrics vs continued POC?   4:24 PM, 07/26/22 M. Shary Decamp, PT, DPT Physical Therapist- [place] Office Number: [phone]    [facility] at [facility] [address], [address] [place], [place] [address] Phone # [phone] Fax # [phone]

## 2022-07-27 ENCOUNTER — Ambulatory Visit: Payer: Medicare Other | Admitting: Physical Therapy

## 2022-08-01 ENCOUNTER — Ambulatory Visit: Payer: Medicare Other | Admitting: Physical Therapy

## 2022-08-01 ENCOUNTER — Encounter: Payer: Self-pay | Admitting: Physical Therapy

## 2022-08-01 ENCOUNTER — Ambulatory Visit: Payer: Medicare Other | Admitting: Occupational Therapy

## 2022-08-01 DIAGNOSIS — R2689 Other abnormalities of gait and mobility: Secondary | ICD-10-CM

## 2022-08-01 DIAGNOSIS — R2681 Unsteadiness on feet: Secondary | ICD-10-CM

## 2022-08-01 DIAGNOSIS — M6281 Muscle weakness (generalized): Secondary | ICD-10-CM

## 2022-08-01 DIAGNOSIS — R262 Difficulty in walking, not elsewhere classified: Secondary | ICD-10-CM

## 2022-08-01 NOTE — Therapy (Signed)
OUTPATIENT PHYSICAL THERAPY NEURO TREATMENT   Patient Name: Zachary Hill MRN: [id number] DOB:1981-05-27, 41 y.o., male Today's Date: 08/01/2022   PCP: none listed REFERRING PROVIDER: Ranelle Oyster, MD    PT End of Session - 08/01/22 1638     Visit Number 7    Number of Visits 12    Date for PT Re-Evaluation 08/17/22    Authorization Type Medicare A&B/[place] Access 2023    Authorization Time Period 27 VL combined    Authorization - Visit Number 7    Authorization - Number of Visits 27    Progress Note Due on Visit 10    PT Start Time 1557    PT Stop Time 1640    PT Time Calculation (min) 43 min    Activity Tolerance Patient tolerated treatment well;Patient limited by pain    Behavior During Therapy [facility] for tasks assessed/performed                Past Medical History:  Diagnosis Date   Acute renal failure (HCC)    Anemia    Diabetes mellitus without complication (HCC)    Hepatitis B    03/21/12 - sAg neg, sAb pos, cAb pos, indication prior infection   HIV disease (HCC)    CD4 = 10, VL = 475K, 03/21/12.  Medication non-compliance   Past Surgical History:  Procedure Laterality Date   COLONOSCOPY  05/15/2012   Procedure: COLONOSCOPY;  Surgeon: Shirley Friar, MD;  Location: [facility] ENDOSCOPY;  Service: Endoscopy;  Laterality: N/A;  unprepped   ESOPHAGOGASTRODUODENOSCOPY  05/13/2012   Procedure: ESOPHAGOGASTRODUODENOSCOPY (EGD);  Surgeon: Shirley Friar, MD;  Location: [facility] ENDOSCOPY;  Service: Endoscopy;  Laterality: N/A;   IR GASTROSTOMY TUBE MOD SED  04/24/2022   Patient Active Problem List   Diagnosis Date Noted   TBI (traumatic brain injury) (HCC) 05/09/2022   Acute on chronic respiratory failure with hypoxia (HCC) 04/10/2022   Diffuse traumatic brain injury with LOC of 6 hours to 24 hours, sequela (HCC) 04/10/2022   Tracheostomy status (HCC) 04/10/2022   Healthcare maintenance 05/07/2019   Type 2 diabetes mellitus with hyperglycemia, without  long-term current use of insulin (HCC) 01/09/2019   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   Dyspnea 05/20/2012   Acute renal failure (HCC)    Anemia    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) 05/18/2012   Diarrhea 05/14/2012   Pharyngitis 05/14/2012   Acute kidney injury (HCC) 05/14/2012   Thrombocytopenia (HCC) 05/14/2012   Odynophagia 05/13/2012   Fever 05/11/2012   Nausea & vomiting 05/11/2012   Candidal esophagitis (HCC) 04/29/2012   Asymptomatic human immunodeficiency virus (HIV) infection status (HCC) 03/29/2012   Human papillomavirus in conditions classified elsewhere and of unspecified site 03/29/2012    ONSET DATE: 03/08/22  REFERRING DIAG: Q94.9X3S (ICD-10-CM) - Traumatic brain injury, with loss of consciousness of 1 hour to 5 hours 59 minutes, sequela (HCC)   THERAPY DIAG:  Muscle weakness (generalized)  Unsteadiness on feet  Other abnormalities of gait and mobility  Difficulty in walking, not elsewhere classified  Rationale for Evaluation and Treatment Rehabilitation  SUBJECTIVE:  SUBJECTIVE STATEMENT: Doing okay. R thigh and knee pain unchanged. Denies redness, warmth, edema.  Pt accompanied by: significant other  PERTINENT HISTORY: Dysphagia Syphilis Neurosyphilis HIV positivity Diabetes mellitus (pre-diabetes) Paroxysmal tachycardia Hypertension Urinary retention Right globe rupture Anemia  PAIN:  Are you having pain? Yes: NPRS scale: 8/10 Pain location: R thigh/knee cap Pain description: achey, stinging  Aggravating factors: being still Relieving factors: movement, medication Notes his low back has been having discomfort towards end of day  PRECAUTIONS: None, WBAT RLE  WEIGHT BEARING RESTRICTIONS No  FALLS: Has patient fallen in  last 6 months? No  LIVING ENVIRONMENT: Lives with: lives with their spouse Lives in: House/apartment Stairs: Yes: External: a few steps; on left going up Has following equipment at home: None  PLOF: Independent  PATIENT GOALS Be able to put pants on standing up (can't balance on one leg)  OBJECTIVE:     TODAY'S TREATMENT: 08/01/22 Activity Comments  Nustep L4 x 6 min Ues/Les  Reported good tolerance   Palpation  Reports discomfort with palpation over R quad and patellar tendons and lateral jt line; mild edema palpable  R mod thomas stretch 3x30" Good tolerance at limited ROM  R SLR x10  No quad lag  Supine clam with green TB 2x15 Good tolerance   SL hip abduction x10 each LE Cueing for alignment and form; c/o pain on R- discontinued; able to perform on L LE  KT tape application to R knee 2 strips on lateral/medial patella, 2 strips on superior and inferior patella             PATIENT EDUCATION: Education details: edu on use of ice and elevation to R knee x10 min /day; HEP update; advised to remove KT tape after 3 days; advised to schedule f/u with orthopedic surgeon d/t c/o pain in knee and feet Person educated: Patient Education method: Explanation, Demonstration, Tactile cues, Verbal cues, and Handouts Education comprehension: verbalized understanding and returned demonstration  HOME EXERCISE PROGRAM Last updated: 08/01/22 Access Code: [email or web address] URL: [email or web address] Date: 08/01/2022 Prepared by: [facility]  Exercises - Clamshell  - 1 x daily - 7 x weekly - 3 sets - 10 reps - Prone Hip Extension with Bent Knee - One Pillow  - 1 x daily - 7 x weekly - 3 sets - 10 reps - Side Stepping with Resistance at Thighs  - 1 x daily - 7 x weekly - 3 sets - 10 reps - Sit to Stand with Resistance Around Legs  - 1 x daily - 7 x weekly - 5 sets - 5 reps - Modified Thomas Stretch  - 1 x daily - 5 x weekly - 3 sets - 30 sec  hold   Below measures were taken at time of initial evaluation unless otherwise specified:   DIAGNOSTIC FINDINGS: Cranial imaging revealed multi-compartmental intracerebral hemorrhage and neurosurgery was consulted for placement of intracranial pressure monitor. Other injuries included right globe rupture, right orbital fractures, right zygomatic arch fracture, left 9th rib fracture, right femoral fracture   COGNITION: Overall cognitive status:  2/3 short term memory recall   SENSATION: WFL  COORDINATION: WFL  EDEMA:  none  MUSCLE TONE: WNL   MUSCLE LENGTH: decreased right quad flexibility +Ely's   DTRs:  Patella 2+ = Normal and Achilles 2+ = Normal  POSTURE: No Significant postural limitations  LOWER EXTREMITY ROM:     ROM WNL  LOWER EXTREMITY MMT:    MMT Right Eval  Left Eval  Hip flexion 3 5  Hip extension 2+ 5  Hip abduction 2+ 4  Hip adduction    Hip internal rotation    Hip external rotation    Knee flexion 4 5  Knee extension 4 5  Ankle dorsiflexion 5 5  Ankle plantarflexion    Ankle inversion    Ankle eversion    (Blank rows = not tested)  BED MOBILITY:  independent  TRANSFERS: Assistive device utilized: None  Sit to stand: Complete Independence Stand to sit: Complete Independence Chair to chair: Complete Independence Floor:  DNT  RAMP:  DNT  CURB:  Level of Assistance: Modified independence Assistive device utilized: None Curb Comments:   STAIRS:  Level of Assistance: Modified independence  Stair Negotiation Technique: Alternating Pattern  with Single Rail on Left  Number of Stairs: 10   Height of Stairs: 4-6"  Comments: increased right lateral hip discomfort  GAIT: Gait pattern: trendelenburg and lateral lean- Right compensated Distance walked: community Assistive device utilized: None Level of assistance: Complete Independence Comments: pronounced deficit from lack of glute med stability  FUNCTIONAL TESTs:  5 times sit  to stand: 13.31 sec Berg Balance Scale: 56/56 Dynamic Gait Index: 15/24 Functional gait assessment: NT  M-CTSIB  Condition 1: Firm Surface, EO 30 Sec, Normal Sway  Condition 2: Firm Surface, EC 30 Sec, Normal Sway  Condition 3: Foam Surface, EO 30 Sec, Normal Sway  Condition 4: Foam Surface, EC 30 Sec, Mild Sway     PATIENT SURVEYS:    TODAY'S TREATMENT:  Evaluation, HEP initiated, gait train w/ cane   PATIENT EDUCATION: Education details: regarding obtaining cane Person educated: Patient and Spouse Education method: Explanation Education comprehension: verbalized understanding      GOALS: Goals reviewed with patient? Yes  SHORT TERM GOALS: Target date: 07/27/2022  Patient will be independent in HEP to improve functional outcomes Baseline: Goal status: IN PROGRESS  2.  Demo ability to ambulate x 10 min w/ least restrictive AD without gait deviation in order to improve safety and efficiency of gait for community ambulation Baseline: no AD, right lateral lean/compensated Trendelenberg Goal status: IN PROGRESS    LONG TERM GOALS: Target date: 08/17/2022  Demo right hip abductor and hip extensor strength to 4-/5 to facilitate stable gait kinematics without deviation Baseline: 2+/5 w/ right lateral lean in stance phase Goal status: IN PROGRESS  2.  Demo improved safety and balance per score 24/24 Dynamic Gait Index Baseline: 15/24 Goal status: IN PROGRESS  3.  Manifest improved balance, strength, and coordination per ability to don/doff pants while standing Baseline: required to sit for lower body dressing Goal status: IN PROGRESS  4.  Demonstrate/report ability to ambulate x 30 min with gait deviations Baseline: unable Goal status: IN PROGRESS    ASSESSMENT:  CLINICAL IMPRESSION: Patient arrived to session with report of continued R thigh and knee pain without redness, warmth, edema. Upon palpation, patient with report of discomfort at R quad and  patellar tendons and with slight edema palpated. Educated and practice quad stretching to help relieve pressure from surrounding tendons. Worked on gentle quad strengthening activities to tolerance. Patient still with report of unchanged pain levels, thus continued with mat ther-ex. Ended session with KT tape application to R knee for pain relief. Patient with c/o no increased pain at end of session.   OBJECTIVE IMPAIRMENTS Abnormal gait, decreased activity tolerance, decreased balance, difficulty walking, decreased strength, impaired flexibility, and pain.   ACTIVITY LIMITATIONS carrying, lifting, squatting, stairs,  dressing, and locomotion level  PARTICIPATION LIMITATIONS: cleaning, shopping, community activity, occupation, and yard work  PERSONAL FACTORS 1 comorbidity: PMH  are also affecting patient's functional outcome.   REHAB POTENTIAL: Excellent  CLINICAL DECISION MAKING: Stable/uncomplicated  EVALUATION COMPLEXITY: Low  PLAN: PT FREQUENCY: 1-2x/week  PT DURATION: 6 weeks  PLANNED INTERVENTIONS: Therapeutic exercises, Therapeutic activity, Neuromuscular re-education, Balance training, Gait training, Patient/Family education, Self Care, Joint mobilization, Stair training, DME instructions, Aquatic Therapy, Dry Needling, Electrical stimulation, Cryotherapy, Moist heat, Taping, and Manual therapy  PLAN FOR NEXT SESSION: Check STGs (unable to check 10/31 d/t pain levels).  Continue working on RLE stance strengthening exercises.  Re-assess right knee pain as needed, initiate hip/knee isometrics vs continued POC?  Anette Guarneri, PT, DPT 08/01/22 4:44 PM  [place] [facility] at [facility] [address] [place], [address] [place], [place] [address] Phone # [phone] Fax # [phone]

## 2022-08-02 NOTE — Therapy (Signed)
OUTPATIENT PHYSICAL THERAPY NEURO TREATMENT   Patient Name: Zachary Hill MRN: [id number] DOB:10-Feb-1981, 41 y.o., male Today's Date: 08/03/2022   PCP: none listed REFERRING PROVIDER: Meredith Staggers, MD    PT End of Session - 08/03/22 1657     Visit Number 8    Number of Visits 12    Date for PT Re-Evaluation 08/17/22    Authorization Type Medicare A&B/[place] Access 2023    Authorization Time Period 27 VL combined    Authorization - Visit Number 8    Authorization - Number of Visits 27    Progress Note Due on Visit 10    PT Start Time 1625   pt late   PT Stop Time 1702    PT Time Calculation (min) 37 min    Equipment Utilized During Treatment Gait belt    Activity Tolerance Patient tolerated treatment well;Patient limited by pain    Behavior During Therapy WFL for tasks assessed/performed                 Past Medical History:  Diagnosis Date   Acute renal failure ([place])    Anemia    Diabetes mellitus without complication ([place])    Hepatitis B    03/21/12 - sAg neg, sAb pos, cAb pos, indication prior infection   HIV disease ([place])    CD4 = 10, VL = 475K, 03/21/12.  Medication non-compliance   Past Surgical History:  Procedure Laterality Date   COLONOSCOPY  05/15/2012   Procedure: COLONOSCOPY;  Surgeon: Lear Ng, MD;  Location: [facility] ENDOSCOPY;  Service: Endoscopy;  Laterality: N/A;  unprepped   ESOPHAGOGASTRODUODENOSCOPY  05/13/2012   Procedure: ESOPHAGOGASTRODUODENOSCOPY (EGD);  Surgeon: Lear Ng, MD;  Location: [facility] ENDOSCOPY;  Service: Endoscopy;  Laterality: N/A;   IR GASTROSTOMY TUBE MOD SED  04/24/2022   Patient Active Problem List   Diagnosis Date Noted   TBI (traumatic brain injury) ([place]) 05/09/2022   Acute on chronic respiratory failure with hypoxia ([place]) 04/10/2022   Diffuse traumatic brain injury with LOC of 6 hours to 24 hours, sequela ([place]) 04/10/2022   Tracheostomy status ([place]) 04/10/2022   Healthcare maintenance 05/07/2019    Type 2 diabetes mellitus with hyperglycemia, without long-term current use of insulin ([place]) 01/09/2019   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[place]) 06/29/2012   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[place]) 06/29/2012   Dyspnea 05/20/2012   Acute renal failure ([facility])    Anemia    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 ([place]) 05/18/2012   Diarrhea 05/14/2012   Pharyngitis 05/14/2012   Acute kidney injury ([place]) 05/14/2012   Thrombocytopenia ([place]) 05/14/2012   Odynophagia 05/13/2012   Fever 05/11/2012   Nausea & vomiting 05/11/2012   Candidal esophagitis ([place]) 04/29/2012   Asymptomatic human immunodeficiency virus (HIV) infection status ([place]) 03/29/2012   Human papillomavirus in conditions classified elsewhere and of unspecified site 03/29/2012    ONSET DATE: 03/08/22  REFERRING DIAG: [id number] (ICD-10-CM) - Traumatic brain injury, with loss of consciousness of 1 hour to 5 hours 59 minutes, sequela ([place])   THERAPY DIAG:  Muscle weakness (generalized)  Unsteadiness on feet  Other abnormalities of gait and mobility  Difficulty in walking, not elsewhere classified  Rationale for Evaluation and Treatment Rehabilitation  SUBJECTIVE:  SUBJECTIVE STATEMENT: Not much new. Pain is a little better today. Feels like the knee tape helped.  Pt accompanied by: significant other  PERTINENT HISTORY: Dysphagia Syphilis Neurosyphilis HIV positivity Diabetes mellitus (pre-diabetes) Paroxysmal tachycardia Hypertension Urinary retention Right globe rupture Anemia  PAIN:  Are you having pain? Yes: NPRS scale: 6-7/10 Pain location: R thigh/knee cap Pain description: achey, stinging  Aggravating factors: being still Relieving factors: movement, medication Notes his low back has been having discomfort towards end of day  PRECAUTIONS: None, WBAT RLE  WEIGHT  BEARING RESTRICTIONS No  FALLS: Has patient fallen in last 6 months? No  LIVING ENVIRONMENT: Lives with: lives with their spouse Lives in: House/apartment Stairs: Yes: External: a few steps; on left going up Has following equipment at home: None  PLOF: Independent  PATIENT GOALS Be able to put pants on standing up (can't balance on one leg)  OBJECTIVE:     TODAY'S TREATMENT: 08/03/22 Activity Comments  Nustep L5 x 6 min Ues/LEs Maintaining 60 SPM  standing lateral wt shifts 10x Focusing on R shift  standing ant/pos wt shifts 10x  Good weight shift   romberg on foam EO/EC 30" each Good stability   romberg on foam EO/EC 30" each Good stability   R step up/backs on 4" and 6" steps 2x5 Good stability   2x stair navigation with 1 handrail alternating reciprocal pattern  Difficulty with taller steps, resorting to step-to descending   sidestepping with red TB above knees 4x46ft Cueing to maintain shoulders square/still; latent report of R ankle discomfort  Supine R HS stretch 2x30" With strap  R mod thomas stretch 2x30" With strap      HOME EXERCISE PROGRAM Last updated: 08/01/22 Access Code: [email or web address] URL: [email or web address] Date: 08/01/2022 Prepared by: [facility]  Exercises - Clamshell  - 1 x daily - 7 x weekly - 3 sets - 10 reps - Prone Hip Extension with Bent Knee - One Pillow  - 1 x daily - 7 x weekly - 3 sets - 10 reps - Side Stepping with Resistance at Thighs  - 1 x daily - 7 x weekly - 3 sets - 10 reps - Sit to Stand with Resistance Around Legs  - 1 x daily - 7 x weekly - 5 sets - 5 reps - Modified Thomas Stretch  - 1 x daily - 5 x weekly - 3 sets - 30 sec hold    Below measures were taken at time of initial evaluation unless otherwise specified:   DIAGNOSTIC FINDINGS: Cranial imaging revealed multi-compartmental intracerebral hemorrhage and neurosurgery was consulted for placement of intracranial pressure  monitor. Other injuries included right globe rupture, right orbital fractures, right zygomatic arch fracture, left 9th rib fracture, right femoral fracture   COGNITION: Overall cognitive status:  2/3 short term memory recall   SENSATION: WFL  COORDINATION: WFL  EDEMA:  none  MUSCLE TONE: WNL   MUSCLE LENGTH: decreased right quad flexibility +Ely's   DTRs:  Patella 2+ = Normal and Achilles 2+ = Normal  POSTURE: No Significant postural limitations  LOWER EXTREMITY ROM:     ROM WNL  LOWER EXTREMITY MMT:    MMT Right Eval Left Eval  Hip flexion 3 5  Hip extension 2+ 5  Hip abduction 2+ 4  Hip adduction    Hip internal rotation    Hip external rotation    Knee flexion 4 5  Knee extension 4 5  Ankle dorsiflexion 5 5  Ankle plantarflexion    Ankle inversion    Ankle eversion    (Blank rows = not tested)  BED MOBILITY:  independent  TRANSFERS: Assistive device utilized: None  Sit to stand: Complete Independence Stand to sit: Complete Independence Chair to chair: Complete Independence Floor:  DNT  RAMP:  DNT  CURB:  Level of Assistance: Modified independence Assistive device utilized: None Curb Comments:   STAIRS:  Level of Assistance: Modified independence  Stair Negotiation Technique: Alternating Pattern  with Single Rail on Left  Number of Stairs: 10   Height of Stairs: 4-6"  Comments: increased right lateral hip discomfort  GAIT: Gait pattern: trendelenburg and lateral lean- Right compensated Distance walked: community Assistive device utilized: None Level of assistance: Complete Independence Comments: pronounced deficit from lack of glute med stability  FUNCTIONAL TESTs:  5 times sit to stand: 13.31 sec Berg Balance Scale: 56/56 Dynamic Gait Index: 15/24 Functional gait assessment: NT  M-CTSIB  Condition 1: Firm Surface, EO 30 Sec, Normal Sway  Condition 2: Firm Surface, EC 30 Sec, Normal Sway  Condition 3: Foam Surface, EO 30 Sec,  Normal Sway  Condition 4: Foam Surface, EC 30 Sec, Mild Sway     PATIENT SURVEYS:    TODAY'S TREATMENT:  Evaluation, HEP initiated, gait train w/ cane   PATIENT EDUCATION: Education details: regarding obtaining cane Person educated: Patient and Spouse Education method: Explanation Education comprehension: verbalized understanding      GOALS: Goals reviewed with patient? Yes  SHORT TERM GOALS: Target date: 07/27/2022  Patient will be independent in HEP to improve functional outcomes Baseline: Goal status: IN PROGRESS  2.  Demo ability to ambulate x 10 min w/ least restrictive AD without gait deviation in order to improve safety and efficiency of gait for community ambulation Baseline: no AD, right lateral lean/compensated Trendelenberg Goal status: IN PROGRESS    LONG TERM GOALS: Target date: 08/17/2022  Demo right hip abductor and hip extensor strength to 4-/5 to facilitate stable gait kinematics without deviation Baseline: 2+/5 w/ right lateral lean in stance phase Goal status: IN PROGRESS  2.  Demo improved safety and balance per score 24/24 Dynamic Gait Index Baseline: 15/24 Goal status: IN PROGRESS  3.  Manifest improved balance, strength, and coordination per ability to don/doff pants while standing Baseline: required to sit for lower body dressing Goal status: IN PROGRESS  4.  Demonstrate/report ability to ambulate x 30 min with gait deviations Baseline: unable Goal status: IN PROGRESS    ASSESSMENT:  CLINICAL IMPRESSION: Patient arrived to session with report of some improvement in knee pain today. Worked on standing activities to promote weight shifting. Also challenged balance with eyes open and closed, which revealed very minimal sway. Step ups were performed with good stability and eccentric control, thus trialed reciprocal alternating stair navigation. Noted some lightheadedness at end of session; improved after sitting water break. No further  complaints at end of session.   OBJECTIVE IMPAIRMENTS Abnormal gait, decreased activity tolerance, decreased balance, difficulty walking, decreased strength, impaired flexibility, and pain.   ACTIVITY LIMITATIONS carrying, lifting, squatting, stairs, dressing, and locomotion level  PARTICIPATION LIMITATIONS: cleaning, shopping, community activity, occupation, and yard work  PERSONAL FACTORS 1 comorbidity: PMH  are also affecting patient's functional outcome.   REHAB POTENTIAL: Excellent  CLINICAL DECISION MAKING: Stable/uncomplicated  EVALUATION COMPLEXITY: Low  PLAN: PT FREQUENCY: 1-2x/week  PT DURATION: 6 weeks  PLANNED INTERVENTIONS: Therapeutic exercises, Therapeutic activity, Neuromuscular re-education, Balance training, Gait training, Patient/Family education, Self Care, Joint mobilization,  Stair training, DME instructions, Aquatic Therapy, Dry Needling, Electrical stimulation, Cryotherapy, Moist heat, Taping, and Manual therapy  PLAN FOR NEXT SESSION:  Continue working on RLE stance strengthening exercises.  Re-assess right knee pain as needed, initiate hip/knee isometrics vs continued POC?  Janene Harvey, PT, DPT 08/03/22 5:08 PM  [facility] [address] [place], [place], [place] [address] Phone # [phone] Fax # [phone]

## 2022-08-03 ENCOUNTER — Encounter: Payer: Self-pay | Admitting: Physical Therapy

## 2022-08-03 ENCOUNTER — Ambulatory Visit: Payer: Medicare Other | Attending: Physician Assistant | Admitting: Physical Therapy

## 2022-08-03 DIAGNOSIS — M6281 Muscle weakness (generalized): Secondary | ICD-10-CM | POA: Diagnosis present

## 2022-08-03 DIAGNOSIS — M25561 Pain in right knee: Secondary | ICD-10-CM | POA: Diagnosis present

## 2022-08-03 DIAGNOSIS — R262 Difficulty in walking, not elsewhere classified: Secondary | ICD-10-CM | POA: Insufficient documentation

## 2022-08-03 DIAGNOSIS — R2689 Other abnormalities of gait and mobility: Secondary | ICD-10-CM | POA: Insufficient documentation

## 2022-08-03 DIAGNOSIS — H5410 Blindness, one eye, low vision other eye, unspecified eyes: Secondary | ICD-10-CM | POA: Insufficient documentation

## 2022-08-03 DIAGNOSIS — R41841 Cognitive communication deficit: Secondary | ICD-10-CM | POA: Insufficient documentation

## 2022-08-03 DIAGNOSIS — R41840 Attention and concentration deficit: Secondary | ICD-10-CM | POA: Diagnosis present

## 2022-08-03 DIAGNOSIS — R2681 Unsteadiness on feet: Secondary | ICD-10-CM | POA: Insufficient documentation

## 2022-08-07 ENCOUNTER — Ambulatory Visit: Payer: Medicare Other | Admitting: Occupational Therapy

## 2022-08-07 ENCOUNTER — Ambulatory Visit: Payer: Medicare Other

## 2022-08-07 DIAGNOSIS — M6281 Muscle weakness (generalized): Secondary | ICD-10-CM | POA: Diagnosis not present

## 2022-08-07 DIAGNOSIS — R2681 Unsteadiness on feet: Secondary | ICD-10-CM

## 2022-08-07 DIAGNOSIS — R2689 Other abnormalities of gait and mobility: Secondary | ICD-10-CM

## 2022-08-07 DIAGNOSIS — R262 Difficulty in walking, not elsewhere classified: Secondary | ICD-10-CM

## 2022-08-07 DIAGNOSIS — H5410 Blindness, one eye, low vision other eye, unspecified eyes: Secondary | ICD-10-CM

## 2022-08-07 DIAGNOSIS — R41840 Attention and concentration deficit: Secondary | ICD-10-CM
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
## 2022-08-07 NOTE — Therapy (Signed)
OUTPATIENT PHYSICAL THERAPY NEURO TREATMENT   Patient Name: Zachary Hill MRN: [id number] DOB:10-Feb-1981, 41 y.o., male Today's Date: 08/03/2022   PCP: none listed REFERRING PROVIDER: Meredith Staggers, MD    PT End of Session - 08/03/22 1657     Visit Number 8    Number of Visits 12    Date for PT Re-Evaluation 08/17/22    Authorization Type Medicare A&B/[place] Access 2023    Authorization Time Period 27 VL combined    Authorization - Visit Number 8    Authorization - Number of Visits 27    Progress Note Due on Visit 10    PT Start Time 1625   pt late   PT Stop Time 1702    PT Time Calculation (min) 37 min    Equipment Utilized During Treatment Gait belt    Activity Tolerance Patient tolerated treatment well;Patient limited by pain    Behavior During Therapy WFL for tasks assessed/performed                 Past Medical History:  Diagnosis Date   Acute renal failure ([place])    Anemia    Diabetes mellitus without complication ([place])    Hepatitis B    03/21/12 - sAg neg, sAb pos, cAb pos, indication prior infection   HIV disease ([place])    CD4 = 10, VL = 475K, 03/21/12.  Medication non-compliance   Past Surgical History:  Procedure Laterality Date   COLONOSCOPY  05/15/2012   Procedure: COLONOSCOPY;  Surgeon: Lear Ng, MD;  Location: [facility] ENDOSCOPY;  Service: Endoscopy;  Laterality: N/A;  unprepped   ESOPHAGOGASTRODUODENOSCOPY  05/13/2012   Procedure: ESOPHAGOGASTRODUODENOSCOPY (EGD);  Surgeon: Lear Ng, MD;  Location: [facility] ENDOSCOPY;  Service: Endoscopy;  Laterality: N/A;   IR GASTROSTOMY TUBE MOD SED  04/24/2022   Patient Active Problem List   Diagnosis Date Noted   TBI (traumatic brain injury) ([place]) 05/09/2022   Acute on chronic respiratory failure with hypoxia ([place]) 04/10/2022   Diffuse traumatic brain injury with LOC of 6 hours to 24 hours, sequela ([place]) 04/10/2022   Tracheostomy status ([place]) 04/10/2022   Healthcare maintenance 05/07/2019    Type 2 diabetes mellitus with hyperglycemia, without long-term current use of insulin ([place]) 01/09/2019   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[place]) 06/29/2012   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[place]) 06/29/2012   Dyspnea 05/20/2012   Acute renal failure (HCC)    Anemia    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 ([place]) 05/18/2012   Diarrhea 05/14/2012   Pharyngitis 05/14/2012   Acute kidney injury ([place]) 05/14/2012   Thrombocytopenia ([place]) 05/14/2012   Odynophagia 05/13/2012   Fever 05/11/2012   Nausea & vomiting 05/11/2012   Candidal esophagitis ([place]) 04/29/2012   Asymptomatic human immunodeficiency virus (HIV) infection status ([place]) 03/29/2012   Human papillomavirus in conditions classified elsewhere and of unspecified site 03/29/2012    ONSET DATE: 03/08/22  REFERRING DIAG: [id number] (ICD-10-CM) - Traumatic brain injury, with loss of consciousness of 1 hour to 5 hours 59 minutes, sequela ([place])   THERAPY DIAG:  Muscle weakness (generalized)  Unsteadiness on feet  Other abnormalities of gait and mobility  Difficulty in walking, not elsewhere classified  Rationale for Evaluation and Treatment Rehabilitation  SUBJECTIVE:  SUBJECTIVE STATEMENT: Right knee is hurting badly today Pt accompanied by: significant other  PERTINENT HISTORY: Dysphagia Syphilis Neurosyphilis HIV positivity Diabetes mellitus (pre-diabetes) Paroxysmal tachycardia Hypertension Urinary retention Right globe rupture Anemia  PAIN:  Are you having pain? Yes: NPRS scale: 9/10 Pain location: R thigh/knee cap Pain description: achey, stinging  Aggravating factors: being still Relieving factors: movement, medication Notes his low back has been having discomfort towards end of day  PRECAUTIONS: None, WBAT RLE  WEIGHT BEARING RESTRICTIONS No  FALLS: Has patient  fallen in last 6 months? No  LIVING ENVIRONMENT: Lives with: lives with their spouse Lives in: House/apartment Stairs: Yes: External: a few steps; on left going up Has following equipment at home: None  PLOF: Independent  PATIENT GOALS Be able to put pants on standing up (can't balance on one leg)  OBJECTIVE:   TODAY'S TREATMENT: 08/07/22 Activity Comments  MHP applied to right knee while performin quad set 3x10   TENS unit application and education Demonstration in set-up and dialing in intensity  Supine hip abd/add isometric 1x10--unable to tolerate hooklying on mat table  Sidelying clam shells 3x10  Sidelying hip add isometric 3x10  Prone heel squeeze 3x10  NU-step level 4 x 8 min To improve activity tolerance      TODAY'S TREATMENT: 08/03/22 Activity Comments  Nustep L5 x 6 min Ues/LEs Maintaining 60 SPM  standing lateral wt shifts 10x Focusing on R shift  standing ant/pos wt shifts 10x  Good weight shift   romberg on foam EO/EC 30" each Good stability   romberg on foam EO/EC 30" each Good stability   R step up/backs on 4" and 6" steps 2x5 Good stability   2x stair navigation with 1 handrail alternating reciprocal pattern  Difficulty with taller steps, resorting to step-to descending   sidestepping with red TB above knees 4x4ft Cueing to maintain shoulders square/still; latent report of R ankle discomfort  Supine R HS stretch 2x30" With strap  R mod thomas stretch 2x30" With strap      HOME EXERCISE PROGRAM Last updated: 08/01/22 Access Code: [email or web address] URL: [email or web address] Date: 08/01/2022 Prepared by: [facility] - Outpatient  Rehab - [facility]  Exercises - Clamshell  - 1 x daily - 7 x weekly - 3 sets - 10 reps - Prone Hip Extension with Bent Knee - One Pillow  - 1 x daily - 7 x weekly - 3 sets - 10 reps - Side Stepping with Resistance at Thighs  - 1 x daily - 7 x weekly - 3 sets - 10 reps - Sit to Stand with Resistance Around Legs  -  1 x daily - 7 x weekly - 5 sets - 5 reps - Modified Thomas Stretch  - 1 x daily - 5 x weekly - 3 sets - 30 sec hold    Below measures were taken at time of initial evaluation unless otherwise specified:   DIAGNOSTIC FINDINGS: Cranial imaging revealed multi-compartmental intracerebral hemorrhage and neurosurgery was consulted for placement of intracranial pressure monitor. Other injuries included right globe rupture, right orbital fractures, right zygomatic arch fracture, left 9th rib fracture, right femoral fracture   COGNITION: Overall cognitive status:  2/3 short term memory recall   SENSATION: WFL  COORDINATION: WFL  EDEMA:  none  MUSCLE TONE: WNL   MUSCLE LENGTH: decreased right quad flexibility +Ely's   DTRs:  Patella 2+ = Normal and Achilles 2+ = Normal  POSTURE: No Significant postural limitations  LOWER EXTREMITY ROM:  ROM WNL  LOWER EXTREMITY MMT:    MMT Right Eval Left Eval  Hip flexion 3 5  Hip extension 2+ 5  Hip abduction 2+ 4  Hip adduction    Hip internal rotation    Hip external rotation    Knee flexion 4 5  Knee extension 4 5  Ankle dorsiflexion 5 5  Ankle plantarflexion    Ankle inversion    Ankle eversion    (Blank rows = not tested)  BED MOBILITY:  independent  TRANSFERS: Assistive device utilized: None  Sit to stand: Complete Independence Stand to sit: Complete Independence Chair to chair: Complete Independence Floor:  DNT  RAMP:  DNT  CURB:  Level of Assistance: Modified independence Assistive device utilized: None Curb Comments:   STAIRS:  Level of Assistance: Modified independence  Stair Negotiation Technique: Alternating Pattern  with Single Rail on Left  Number of Stairs: 10   Height of Stairs: 4-6"  Comments: increased right lateral hip discomfort  GAIT: Gait pattern: trendelenburg and lateral lean- Right compensated Distance walked: community Assistive device utilized: None Level of assistance:  Complete Independence Comments: pronounced deficit from lack of glute med stability  FUNCTIONAL TESTs:  5 times sit to stand: 13.31 sec Berg Balance Scale: 56/56 Dynamic Gait Index: 15/24 Functional gait assessment: NT  M-CTSIB  Condition 1: Firm Surface, EO 30 Sec, Normal Sway  Condition 2: Firm Surface, EC 30 Sec, Normal Sway  Condition 3: Foam Surface, EO 30 Sec, Normal Sway  Condition 4: Foam Surface, EC 30 Sec, Mild Sway     PATIENT SURVEYS:    TODAY'S TREATMENT:  Evaluation, HEP initiated, gait train w/ cane   PATIENT EDUCATION: Education details: regarding obtaining cane Person educated: Patient and Spouse Education method: Explanation Education comprehension: verbalized understanding      GOALS: Goals reviewed with patient? Yes  SHORT TERM GOALS: Target date: 07/27/2022  Patient will be independent in HEP to improve functional outcomes Baseline: Goal status: IN PROGRESS  2.  Demo ability to ambulate x 10 min w/ least restrictive AD without gait deviation in order to improve safety and efficiency of gait for community ambulation Baseline: no AD, right lateral lean/compensated Trendelenberg Goal status: IN PROGRESS    LONG TERM GOALS: Target date: 08/17/2022  Demo right hip abductor and hip extensor strength to 4-/5 to facilitate stable gait kinematics without deviation Baseline: 2+/5 w/ right lateral lean in stance phase Goal status: IN PROGRESS  2.  Demo improved safety and balance per score 24/24 Dynamic Gait Index Baseline: 15/24 Goal status: IN PROGRESS  3.  Manifest improved balance, strength, and coordination per ability to don/doff pants while standing Baseline: required to sit for lower body dressing Goal status: IN PROGRESS  4.  Demonstrate/report ability to ambulate x 30 min with gait deviations Baseline: unable Goal status: IN PROGRESS    ASSESSMENT:  CLINICAL IMPRESSION: Continues to be limited by right knee pain tolerating  light activities to strengthen around. Initiated use of TENS unit but very minimal intensity used as directed by patient. Able to tolerate mat table exercises to strengthen hip without increase in pain response.  Continued sessions to progress strength and muscular control to hopefully improved proximal control and minimize gait deviations which may be contributing to continued pain and dysfunction  OBJECTIVE IMPAIRMENTS Abnormal gait, decreased activity tolerance, decreased balance, difficulty walking, decreased strength, impaired flexibility, and pain.   ACTIVITY LIMITATIONS carrying, lifting, squatting, stairs, dressing, and locomotion level  PARTICIPATION LIMITATIONS: cleaning, shopping, community  activity, occupation, and yard work  PERSONAL FACTORS 1 comorbidity: PMH  are also affecting patient's functional outcome.   REHAB POTENTIAL: Excellent  CLINICAL DECISION MAKING: Stable/uncomplicated  EVALUATION COMPLEXITY: Low  PLAN: PT FREQUENCY: 1-2x/week  PT DURATION: 6 weeks  PLANNED INTERVENTIONS: Therapeutic exercises, Therapeutic activity, Neuromuscular re-education, Balance training, Gait training, Patient/Family education, Self Care, Joint mobilization, Stair training, DME instructions, Aquatic Therapy, Dry Needling, Electrical stimulation, Cryotherapy, Moist heat, Taping, and Manual therapy  PLAN FOR NEXT SESSION:  Continue working on RLE stance strengthening exercises.  Re-assess right knee pain as needed, initiate hip/knee isometrics vs continued POC?  4:33 PM, 08/07/22 M. Sherlyn Lees, PT, DPT Physical Therapist- [place] Office Number: [phone]   [place] at [facility] [address] [place], [place] [address] Phone # [phone] Fax # [phone]

## 2022-08-08 NOTE — Therapy (Incomplete)
OUTPATIENT PHYSICAL THERAPY NEURO PROGRESS NOTE   Patient Name: Zachary Hill MRN: [id number] DOB:10-25-1980, 41 y.o., male Today's Date: 08/08/2022   PCP: none listed REFERRING PROVIDER: Meredith Staggers, MD          Past Medical History:  Diagnosis Date   Acute renal failure ([place])    Anemia    Diabetes mellitus without complication ([place])    Hepatitis B    03/21/12 - sAg neg, sAb pos, cAb pos, indication prior infection   HIV disease ([place])    CD4 = 10, VL = 475K, 03/21/12.  Medication non-compliance   Past Surgical History:  Procedure Laterality Date   COLONOSCOPY  05/15/2012   Procedure: COLONOSCOPY;  Surgeon: Lear Ng, MD;  Location: [facility] ENDOSCOPY;  Service: Endoscopy;  Laterality: N/A;  unprepped   ESOPHAGOGASTRODUODENOSCOPY  05/13/2012   Procedure: ESOPHAGOGASTRODUODENOSCOPY (EGD);  Surgeon: Lear Ng, MD;  Location: [facility] ENDOSCOPY;  Service: Endoscopy;  Laterality: N/A;   IR GASTROSTOMY TUBE MOD SED  04/24/2022   Patient Active Problem List   Diagnosis Date Noted   TBI (traumatic brain injury) ([place]) 05/09/2022   Acute on chronic respiratory failure with hypoxia ([place]) 04/10/2022   Diffuse traumatic brain injury with LOC of 6 hours to 24 hours, sequela ([place]) 04/10/2022   Tracheostomy status ([place]) 04/10/2022   Healthcare maintenance 05/07/2019   Type 2 diabetes mellitus with hyperglycemia, without long-term current use of insulin ([place]) 01/09/2019   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[place]) 06/29/2012   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[place]) 06/29/2012   Dyspnea 05/20/2012   Acute renal failure (HCC)    Anemia    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 ([place]) 05/18/2012   Diarrhea 05/14/2012   Pharyngitis 05/14/2012   Acute kidney injury ([place]) 05/14/2012   Thrombocytopenia ([place]) 05/14/2012   Odynophagia 05/13/2012   Fever 05/11/2012   Nausea & vomiting 05/11/2012   Candidal esophagitis ([place]) 04/29/2012   Asymptomatic human immunodeficiency virus (HIV) infection status ([place]) 03/29/2012    Human papillomavirus in conditions classified elsewhere and of unspecified site 03/29/2012    ONSET DATE: 03/08/22  REFERRING DIAG: [id number] (ICD-10-CM) - Traumatic brain injury, with loss of consciousness of 1 hour to 5 hours 59 minutes, sequela ([place])   THERAPY DIAG:  No diagnosis found.  Rationale for Evaluation and Treatment Rehabilitation  SUBJECTIVE:                                                                                                                                                                                              SUBJECTIVE STATEMENT: Right knee is hurting badly today Pt accompanied by: significant other  PERTINENT HISTORY: Dysphagia  Syphilis Neurosyphilis HIV positivity Diabetes mellitus (pre-diabetes) Paroxysmal tachycardia Hypertension Urinary retention Right globe rupture Anemia  PAIN:  Are you having pain? Yes: NPRS scale: 9/10 Pain location: R thigh/knee cap Pain description: achey, stinging  Aggravating factors: being still Relieving factors: movement, medication Notes his low back has been having discomfort towards end of day  PRECAUTIONS: None, WBAT RLE  WEIGHT BEARING RESTRICTIONS No  FALLS: Has patient fallen in last 6 months? No  LIVING ENVIRONMENT: Lives with: lives with their spouse Lives in: House/apartment Stairs: Yes: External: a few steps; on left going up Has following equipment at home: None  PLOF: Independent  PATIENT GOALS Be able to put pants on standing up (can't balance on one leg)  OBJECTIVE:     TODAY'S TREATMENT: 08/09/22 Activity Comments                        HOME EXERCISE PROGRAM Last updated: 08/01/22 Access Code: [email or web address] URL: [email or web address] Date: 08/01/2022 Prepared by: [facility]  Exercises - Clamshell  - 1 x daily - 7 x weekly - 3 sets - 10 reps - Prone Hip Extension with Bent Knee - One Pillow  - 1 x daily - 7 x  weekly - 3 sets - 10 reps - Side Stepping with Resistance at Thighs  - 1 x daily - 7 x weekly - 3 sets - 10 reps - Sit to Stand with Resistance Around Legs  - 1 x daily - 7 x weekly - 5 sets - 5 reps - Modified Thomas Stretch  - 1 x daily - 5 x weekly - 3 sets - 30 sec hold    Below measures were taken at time of initial evaluation unless otherwise specified:   DIAGNOSTIC FINDINGS: Cranial imaging revealed multi-compartmental intracerebral hemorrhage and neurosurgery was consulted for placement of intracranial pressure monitor. Other injuries included right globe rupture, right orbital fractures, right zygomatic arch fracture, left 9th rib fracture, right femoral fracture   COGNITION: Overall cognitive status:  2/3 short term memory recall   SENSATION: WFL  COORDINATION: WFL  EDEMA:  none  MUSCLE TONE: WNL   MUSCLE LENGTH: decreased right quad flexibility +Ely's   DTRs:  Patella 2+ = Normal and Achilles 2+ = Normal  POSTURE: No Significant postural limitations  LOWER EXTREMITY ROM:     ROM WNL  LOWER EXTREMITY MMT:    MMT Right Eval Left Eval  Hip flexion 3 5  Hip extension 2+ 5  Hip abduction 2+ 4  Hip adduction    Hip internal rotation    Hip external rotation    Knee flexion 4 5  Knee extension 4 5  Ankle dorsiflexion 5 5  Ankle plantarflexion    Ankle inversion    Ankle eversion    (Blank rows = not tested)  BED MOBILITY:  independent  TRANSFERS: Assistive device utilized: None  Sit to stand: Complete Independence Stand to sit: Complete Independence Chair to chair: Complete Independence Floor:  DNT  RAMP:  DNT  CURB:  Level of Assistance: Modified independence Assistive device utilized: None Curb Comments:   STAIRS:  Level of Assistance: Modified independence  Stair Negotiation Technique: Alternating Pattern  with Single Rail on Left  Number of Stairs: 10   Height of Stairs: 4-6"  Comments: increased right lateral hip  discomfort  GAIT: Gait pattern: trendelenburg and lateral lean- Right compensated Distance walked: community Assistive device utilized: None Level of assistance:  Complete Independence Comments: pronounced deficit from lack of glute med stability  FUNCTIONAL TESTs:  5 times sit to stand: 13.31 sec Berg Balance Scale: 56/56 Dynamic Gait Index: 15/24 Functional gait assessment: NT  M-CTSIB  Condition 1: Firm Surface, EO 30 Sec, Normal Sway  Condition 2: Firm Surface, EC 30 Sec, Normal Sway  Condition 3: Foam Surface, EO 30 Sec, Normal Sway  Condition 4: Foam Surface, EC 30 Sec, Mild Sway     PATIENT SURVEYS:    TODAY'S TREATMENT:  Evaluation, HEP initiated, gait train w/ cane   PATIENT EDUCATION: Education details: regarding obtaining cane Person educated: Patient and Spouse Education method: Explanation Education comprehension: verbalized understanding      GOALS: Goals reviewed with patient? Yes  SHORT TERM GOALS: Target date: 07/27/2022  Patient will be independent in HEP to improve functional outcomes Baseline: Goal status: IN PROGRESS  2.  Demo ability to ambulate x 10 min w/ least restrictive AD without gait deviation in order to improve safety and efficiency of gait for community ambulation Baseline: no AD, right lateral lean/compensated Trendelenberg Goal status: IN PROGRESS    LONG TERM GOALS: Target date: 08/17/2022  Demo right hip abductor and hip extensor strength to 4-/5 to facilitate stable gait kinematics without deviation Baseline: 2+/5 w/ right lateral lean in stance phase Goal status: IN PROGRESS  2.  Demo improved safety and balance per score 24/24 Dynamic Gait Index Baseline: 15/24 Goal status: IN PROGRESS  3.  Manifest improved balance, strength, and coordination per ability to don/doff pants while standing Baseline: required to sit for lower body dressing Goal status: IN PROGRESS  4.  Demonstrate/report ability to ambulate x 30  min with gait deviations Baseline: unable Goal status: IN PROGRESS    ASSESSMENT:  CLINICAL IMPRESSION: Continues to be limited by right knee pain tolerating light activities to strengthen around. Initiated use of TENS unit but very minimal intensity used as directed by patient. Able to tolerate mat table exercises to strengthen hip without increase in pain response.  Continued sessions to progress strength and muscular control to hopefully improved proximal control and minimize gait deviations which may be contributing to continued pain and dysfunction  OBJECTIVE IMPAIRMENTS Abnormal gait, decreased activity tolerance, decreased balance, difficulty walking, decreased strength, impaired flexibility, and pain.   ACTIVITY LIMITATIONS carrying, lifting, squatting, stairs, dressing, and locomotion level  PARTICIPATION LIMITATIONS: cleaning, shopping, community activity, occupation, and yard work  PERSONAL FACTORS 1 comorbidity: PMH  are also affecting patient's functional outcome.   REHAB POTENTIAL: Excellent  CLINICAL DECISION MAKING: Stable/uncomplicated  EVALUATION COMPLEXITY: Low  PLAN: PT FREQUENCY: 1-2x/week  PT DURATION: 6 weeks  PLANNED INTERVENTIONS: Therapeutic exercises, Therapeutic activity, Neuromuscular re-education, Balance training, Gait training, Patient/Family education, Self Care, Joint mobilization, Stair training, DME instructions, Aquatic Therapy, Dry Needling, Electrical stimulation, Cryotherapy, Moist heat, Taping, and Manual therapy  PLAN FOR NEXT SESSION:  Continue working on RLE stance strengthening exercises.  Re-assess right knee pain as needed, initiate hip/knee isometrics vs continued POC?  9:13 AM, 08/08/22 M. Sherlyn Lees, PT, DPT Physical Therapist- [place] Office Number: [phone]   [place] at [facility] [address] [place], [place] [address] Phone # [phone] Fax # [phone]

## 2022-08-09 ENCOUNTER — Ambulatory Visit: Payer: Medicare Other | Admitting: Physical Therapy

## 2022-08-11 NOTE — Therapy (Signed)
OUTPATIENT PHYSICAL THERAPY NEURO PROGRESS NOTE   Patient Name: Zachary Hill MRN: [id number] DOB:Jan 03, 1981, 41 y.o., male Today's Date: 08/14/2022   Progress Note Reporting Period 07/06/22 to 08/14/22  See note below for Objective Data and Assessment of Progress/Goals.      PCP: none listed REFERRING PROVIDER: Meredith Staggers, MD    PT End of Session - 08/14/22 1648     Visit Number 10    Number of Visits 12    Date for PT Re-Evaluation 08/17/22    Authorization Type Medicare A&B/[place] Access 2023; KX MODIFER    Authorization Time Period 27 VL combined    Authorization - Visit Number 10    Authorization - Number of Visits 27    Progress Note Due on Visit 20    PT Start Time 1619    PT Stop Time 1659    PT Time Calculation (min) 40 min    Equipment Utilized During Treatment --    Activity Tolerance Patient limited by pain    Behavior During Therapy WFL for tasks assessed/performed                  Past Medical History:  Diagnosis Date   Acute renal failure ([place])    Anemia    Diabetes mellitus without complication ([place])    Hepatitis B    03/21/12 - sAg neg, sAb pos, cAb pos, indication prior infection   HIV disease ([place])    CD4 = 10, VL = 475K, 03/21/12.  Medication non-compliance   Past Surgical History:  Procedure Laterality Date   COLONOSCOPY  05/15/2012   Procedure: COLONOSCOPY;  Surgeon: Lear Ng, MD;  Location: [facility] ENDOSCOPY;  Service: Endoscopy;  Laterality: N/A;  unprepped   ESOPHAGOGASTRODUODENOSCOPY  05/13/2012   Procedure: ESOPHAGOGASTRODUODENOSCOPY (EGD);  Surgeon: Lear Ng, MD;  Location: [facility] ENDOSCOPY;  Service: Endoscopy;  Laterality: N/A;   IR GASTROSTOMY TUBE MOD SED  04/24/2022   Patient Active Problem List   Diagnosis Date Noted   TBI (traumatic brain injury) ([place]) 05/09/2022   Acute on chronic respiratory failure with hypoxia ([place]) 04/10/2022   Diffuse traumatic brain injury with LOC of 6 hours to 24  hours, sequela ([place]) 04/10/2022   Tracheostomy status ([place]) 04/10/2022   Healthcare maintenance 05/07/2019   Type 2 diabetes mellitus with hyperglycemia, without long-term current use of insulin ([place]) 01/09/2019   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[place]) 06/29/2012   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[place]) 06/29/2012   Dyspnea 05/20/2012   Acute renal failure ([facility])    Anemia    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 ([place]) 05/18/2012   Diarrhea 05/14/2012   Pharyngitis 05/14/2012   Acute kidney injury ([place]) 05/14/2012   Thrombocytopenia ([place]) 05/14/2012   Odynophagia 05/13/2012   Fever 05/11/2012   Nausea & vomiting 05/11/2012   Candidal esophagitis ([place]) 04/29/2012   Asymptomatic human immunodeficiency virus (HIV) infection status ([place]) 03/29/2012   Human papillomavirus in conditions classified elsewhere and of unspecified site 03/29/2012    ONSET DATE: 03/08/22  REFERRING DIAG: I20.9X3S (ICD-10-CM) - Traumatic brain injury, with loss of consciousness of 1 hour to 5 hours 59 minutes, sequela ([place])   THERAPY DIAG:  Muscle weakness (generalized)  Unsteadiness on feet  Difficulty in walking, not elsewhere classified  Other abnormalities of gait and mobility  Rationale for Evaluation and Treatment Rehabilitation  SUBJECTIVE:  SUBJECTIVE STATEMENT: Been in mood today because of my relationship. Feeling like I am doing well with balance and walking. Reports tolerance for 10 minutes uninterrupted and up to 20 minutes max- limited by R knee pain and fatigue. Reports that he is able to don/doff pants standing without holding on.  Pt accompanied by: significant other  PERTINENT HISTORY: Dysphagia Syphilis Neurosyphilis HIV positivity Diabetes mellitus (pre-diabetes) Paroxysmal tachycardia Hypertension Urinary retention Right globe  rupture Anemia  PAIN:  Are you having pain? Yes: NPRS scale: 6/10 Pain location: R thigh/knee cap Pain description: achey, stinging  Aggravating factors: being still Relieving factors: movement, medication Notes his low back has been having discomfort towards end of day  PRECAUTIONS: None, WBAT RLE  WEIGHT BEARING RESTRICTIONS No  FALLS: Has patient fallen in last 6 months? No  LIVING ENVIRONMENT: Lives with: lives with their spouse Lives in: House/apartment Stairs: Yes: External: a few steps; on left going up Has following equipment at home: None  PLOF: Independent  PATIENT GOALS Be able to put pants on standing up (can't balance on one leg)  OBJECTIVE:     TODAY'S TREATMENT: 08/14/22 Activity Comments  Nustep L4 x 7 min Ues/Les  Maintaining ~70 SPM; unable to tolerate L5  Bridge on red pball x10 Discontinued d/t R knee pain   bridge red TB 3x10  Limited amplitude   Mod thomas stretch Could not tolerate d/t R knee pain- discontinued   TENS application to R knee with MHP x15 min Amplitude to tolerance   R hip ABD MMT 4  R hip EX MMT 3+    PATIENT EDUCATION: Education details: discussed progress with therapy thus far and possibility of wrapping up in following sessions d/t pain being patient's remaining limitation and needs to be addressed with MD appointment ; edu on TENS benefits and safety info with handout Person educated: Patient Education method: Explanation, Demonstration, Tactile cues, Verbal cues, and Handouts Education comprehension: verbalized understanding and returned demonstration   HOME EXERCISE PROGRAM Last updated: 08/01/22 Access Code: [email or web address] URL: [email or web address] Date: 08/01/2022 Prepared by: [facility]  Exercises - Clamshell  - 1 x daily - 7 x weekly - 3 sets - 10 reps - Prone Hip Extension with Bent Knee - One Pillow  - 1 x daily - 7 x weekly - 3 sets - 10 reps - Side Stepping  with Resistance at Thighs  - 1 x daily - 7 x weekly - 3 sets - 10 reps - Sit to Stand with Resistance Around Legs  - 1 x daily - 7 x weekly - 5 sets - 5 reps - Modified Thomas Stretch  - 1 x daily - 5 x weekly - 3 sets - 30 sec hold    Below measures were taken at time of initial evaluation unless otherwise specified:   DIAGNOSTIC FINDINGS: Cranial imaging revealed multi-compartmental intracerebral hemorrhage and neurosurgery was consulted for placement of intracranial pressure monitor. Other injuries included right globe rupture, right orbital fractures, right zygomatic arch fracture, left 9th rib fracture, right femoral fracture   COGNITION: Overall cognitive status:  2/3 short term memory recall   SENSATION: WFL  COORDINATION: WFL  EDEMA:  none  MUSCLE TONE: WNL   MUSCLE LENGTH: decreased right quad flexibility +Ely's   DTRs:  Patella 2+ = Normal and Achilles 2+ = Normal  POSTURE: No Significant postural limitations  LOWER EXTREMITY ROM:     ROM WNL  LOWER EXTREMITY MMT:  MMT Right Eval Left Eval  Hip flexion 3 5  Hip extension 2+ 5  Hip abduction 2+ 4  Hip adduction    Hip internal rotation    Hip external rotation    Knee flexion 4 5  Knee extension 4 5  Ankle dorsiflexion 5 5  Ankle plantarflexion    Ankle inversion    Ankle eversion    (Blank rows = not tested)  BED MOBILITY:  independent  TRANSFERS: Assistive device utilized: None  Sit to stand: Complete Independence Stand to sit: Complete Independence Chair to chair: Complete Independence Floor:  DNT  RAMP:  DNT  CURB:  Level of Assistance: Modified independence Assistive device utilized: None Curb Comments:   STAIRS:  Level of Assistance: Modified independence  Stair Negotiation Technique: Alternating Pattern  with Single Rail on Left  Number of Stairs: 10   Height of Stairs: 4-6"  Comments: increased right lateral hip discomfort  GAIT: Gait pattern: trendelenburg and  lateral lean- Right compensated Distance walked: community Assistive device utilized: None Level of assistance: Complete Independence Comments: pronounced deficit from lack of glute med stability  FUNCTIONAL TESTs:  5 times sit to stand: 13.31 sec Berg Balance Scale: 56/56 Dynamic Gait Index: 15/24 Functional gait assessment: NT  M-CTSIB  Condition 1: Firm Surface, EO 30 Sec, Normal Sway  Condition 2: Firm Surface, EC 30 Sec, Normal Sway  Condition 3: Foam Surface, EO 30 Sec, Normal Sway  Condition 4: Foam Surface, EC 30 Sec, Mild Sway     PATIENT SURVEYS:    TODAY'S TREATMENT:  Evaluation, HEP initiated, gait train w/ cane   PATIENT EDUCATION: Education details: regarding obtaining cane Person educated: Patient and Spouse Education method: Explanation Education comprehension: verbalized understanding      GOALS: Goals reviewed with patient? Yes  SHORT TERM GOALS: Target date: 07/27/2022  Patient will be independent in HEP to improve functional outcomes Baseline: Goal status: MET  2.  Demo ability to ambulate x 10 min w/ least restrictive AD without gait deviation in order to improve safety and efficiency of gait for community ambulation Baseline: no AD, right lateral lean/compensated Trendelenberg Goal status: MET 08/14/22    LONG TERM GOALS: Target date: 08/17/2022  Demo right hip abductor and hip extensor strength to 4-/5 to facilitate stable gait kinematics without deviation Baseline: 2+/5 w/ right lateral lean in stance phase; 4/5 and 3+/5 respectively 08/14/22 Goal status: IN PROGRESS 08/14/22  2.  Demo improved safety and balance per score 24/24 Dynamic Gait Index Baseline: 15/24; NT d/t pain levels 08/14/22 Goal status: IN PROGRESS 08/14/22  3.  Manifest improved balance, strength, and coordination per ability to don/doff pants while standing Baseline: required to sit for lower body dressing; reports ability to perform 08/14/22 Goal status: MET  08/14/22  4.  Demonstrate/report ability to ambulate x 30 min with gait deviations Baseline: unable; reports he is tolerating 20 min 08/14/22 Goal status: IN PROGRESS 08/14/22    ASSESSMENT:  CLINICAL IMPRESSION: Patient arrived to session with continued knee pain. Patient reports that he is able to tolerate walking for up to 20 minutes, limited by R knee pain and fatigue. Notes that he is able to don/doff pants standing without requiring UE assist. Strength testing today revealed improved R glute strength, however still with limitations. Patient with poor tolerance of even gentle LE stretching and strengthening activities on mat, thus treated with heat and TENS to diminish pain levels. Provided info on TENS for home use. Patient is demonstrating slow but  steady progress towards goals, with pain being main barrier to progress.   OBJECTIVE IMPAIRMENTS Abnormal gait, decreased activity tolerance, decreased balance, difficulty walking, decreased strength, impaired flexibility, and pain.   ACTIVITY LIMITATIONS carrying, lifting, squatting, stairs, dressing, and locomotion level  PARTICIPATION LIMITATIONS: cleaning, shopping, community activity, occupation, and yard work  PERSONAL FACTORS 1 comorbidity: PMH  are also affecting patient's functional outcome.   REHAB POTENTIAL: Excellent  CLINICAL DECISION MAKING: Stable/uncomplicated  EVALUATION COMPLEXITY: Low  PLAN: PT FREQUENCY: 1-2x/week  PT DURATION: 6 weeks  PLANNED INTERVENTIONS: Therapeutic exercises, Therapeutic activity, Neuromuscular re-education, Balance training, Gait training, Patient/Family education, Self Care, Joint mobilization, Stair training, DME instructions, Aquatic Therapy, Dry Needling, Electrical stimulation, Cryotherapy, Moist heat, Taping, and Manual therapy  PLAN FOR NEXT SESSION:  DGI; Continue working on RLE stance strengthening exercises.  Re-assess right knee pain as needed, initiate hip/knee isometrics vs  continued POC?     Janene Harvey, PT, DPT 08/14/22 5:12 PM  [facility] at [facility] [address] [place], [place], [place] [address] Phone # [phone] Fax # [phone]

## 2022-08-14 ENCOUNTER — Ambulatory Visit: Payer: Medicare Other | Admitting: Physical Therapy

## 2022-08-14 ENCOUNTER — Encounter: Payer: Self-pay | Admitting: Physical Therapy

## 2022-08-14 ENCOUNTER — Ambulatory Visit: Payer: Medicare Other | Admitting: Occupational Therapy

## 2022-08-14 DIAGNOSIS — R2681 Unsteadiness on feet: Secondary | ICD-10-CM

## 2022-08-14 DIAGNOSIS — R41840 Attention and concentration deficit: Secondary | ICD-10-CM

## 2022-08-14 DIAGNOSIS — M6281 Muscle weakness (generalized): Secondary | ICD-10-CM

## 2022-08-14 DIAGNOSIS — R262 Difficulty in walking, not elsewhere classified: Secondary | ICD-10-CM

## 2022-08-14 DIAGNOSIS — R2689 Other abnormalities of gait and mobility: Secondary | ICD-10-CM

## 2022-08-14 DIAGNOSIS — H5410 Blindness, one eye, low vision other eye, unspecified eyes: Secondary | ICD-10-CM
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
## 2022-08-14 NOTE — Patient Instructions (Signed)

## 2022-08-15 NOTE — Therapy (Signed)
[facility] RE-CERTIFICATION   Patient Name: Zachary Hill MRN: [id number] DOB:23-Sep-1981, 41 y.o., male Today's Date: 08/16/2022    PCP: none listed REFERRING PROVIDER: Meredith Staggers, MD    PT End of Session - 08/16/22 1656     Visit Number 11    Number of Visits 15    Date for PT Re-Evaluation 09/13/22    Authorization Type Medicare A&B/Juncal Access 2023; KX MODIFER    Authorization Time Period 27 VL combined    Authorization - Visit Number 11    Authorization - Number of Visits 27    Progress Note Due on Visit 66    PT Start Time 1622   pt late   PT Stop Time 1652    PT Time Calculation (min) 30 min    Equipment Utilized During Treatment Gait belt    Activity Tolerance Patient tolerated treatment well    Behavior During Therapy WFL for tasks assessed/performed                   Past Medical History:  Diagnosis Date   Acute renal failure ([place])    Anemia    Diabetes mellitus without complication ([place])    Hepatitis B    03/21/12 - sAg neg, sAb pos, cAb pos, indication prior infection   HIV disease ([place])    CD4 = 10, VL = 475K, 03/21/12.  Medication non-compliance   Past Surgical History:  Procedure Laterality Date   COLONOSCOPY  05/15/2012   Procedure: COLONOSCOPY;  Surgeon: Lear Ng, MD;  Location: [facility] ENDOSCOPY;  Service: Endoscopy;  Laterality: N/A;  unprepped   ESOPHAGOGASTRODUODENOSCOPY  05/13/2012   Procedure: ESOPHAGOGASTRODUODENOSCOPY (EGD);  Surgeon: Lear Ng, MD;  Location: [facility] ENDOSCOPY;  Service: Endoscopy;  Laterality: N/A;   IR GASTROSTOMY TUBE MOD SED  04/24/2022   Patient Active Problem List   Diagnosis Date Noted   TBI (traumatic brain injury) ([place]) 05/09/2022   Acute on chronic respiratory failure with hypoxia ([place]) 04/10/2022   Diffuse traumatic brain injury with LOC of 6 hours to 24 hours, sequela ([place]) 04/10/2022   Tracheostomy status ([place]) 04/10/2022   Healthcare maintenance  05/07/2019   Type 2 diabetes mellitus with hyperglycemia, without long-term current use of insulin ([place]) 01/09/2019   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[place]) 06/29/2012   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[place]) 06/29/2012   Dyspnea 05/20/2012   Acute renal failure ([facility])    Anemia    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 ([place]) 05/18/2012   Diarrhea 05/14/2012   Pharyngitis 05/14/2012   Acute kidney injury ([place]) 05/14/2012   Thrombocytopenia ([place]) 05/14/2012   Odynophagia 05/13/2012   Fever 05/11/2012   Nausea & vomiting 05/11/2012   Candidal esophagitis ([place]) 04/29/2012   Asymptomatic human immunodeficiency virus (HIV) infection status ([place]) 03/29/2012   Human papillomavirus in conditions classified elsewhere and of unspecified site 03/29/2012    ONSET DATE: 03/08/22  REFERRING DIAG: J19.9X3S (ICD-10-CM) - Traumatic brain injury, with loss of consciousness of 1 hour to 5 hours 59 minutes, sequela ([place])   THERAPY DIAG:  Other abnormalities of gait and mobility  Difficulty in walking, not elsewhere classified  Muscle weakness (generalized)  Unsteadiness on feet  Right knee pain, unspecified chronicity  Rationale for Evaluation and Treatment Rehabilitation  SUBJECTIVE:  SUBJECTIVE STATEMENT: Doing well. 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 Took some pain meds since the knee was hurting earlier- not as achy now. Reports that he would like to continue PT. Reports that he still sits to put on pants and underwear- limited by fear of falling.   Pt accompanied by: significant other  PERTINENT HISTORY: Dysphagia Syphilis Neurosyphilis HIV positivity Diabetes mellitus (pre-diabetes) Paroxysmal tachycardia Hypertension Urinary retention Right globe rupture Anemia  PAIN:  Are you having pain? Yes: NPRS scale:  6/10 Pain location: R thigh/knee cap Pain description: achey, stinging  Aggravating factors: being still Relieving factors: movement, medication Notes his low back has been having discomfort towards end of day  PRECAUTIONS: None, WBAT RLE  WEIGHT BEARING RESTRICTIONS No  FALLS: Has patient fallen in last 6 months? No  LIVING ENVIRONMENT: Lives with: lives with their spouse Lives in: House/apartment Stairs: Yes: External: a few steps; on left going up Has following equipment at home: None  PLOF: Independent  PATIENT GOALS Be able to put pants on standing up (can't balance on one leg)  OBJECTIVE:    TODAY'S TREATMENT: 08/16/22 Activity Comments  DGI 19/24  SLS R 26 sec c/o R knee "irritated"   SLS L  30 sec with opposite foot resting on ankle     [facility] PT Assessment - 08/16/22 0001       Dynamic Gait Index   Level Surface Mild Impairment    Change in Gait Speed Normal    Gait with Horizontal Head Turns Mild Impairment    Gait with Vertical Head Turns Normal    Gait and Pivot Turn Normal    Step Over Obstacle Mild Impairment    Step Around Obstacles Mild Impairment    Steps Mild Impairment    Total Score 19             PATIENT EDUCATION: Education details: discussed progress with therapy thus far and goals going forward, encouraged to continue using "[employer] [facility]" for mental health counseling d/t major life change; review/update to HEP and discussed patient's tolerance for HEP; POC; re-iterated recommendation to f/u with R LE surgeon d/t continued R knee pain Person educated: Patient Education method: Explanation, Demonstration, Tactile cues, Verbal cues, and Handouts Education comprehension: verbalized understanding and returned demonstration   HOME EXERCISE PROGRAM Last updated: 08/17/22 Access Code: [email or web address] URL: [email or web address] Date: 08/16/2022 Prepared by: [facility]  Exercises - Clamshell  -  1 x daily - 7 x weekly - 3 sets - 10 reps - Prone Hip Extension with Bent Knee - One Pillow  - 1 x daily - 7 x weekly - 3 sets - 10 reps - Side Stepping with Resistance at Thighs  - 1 x daily - 7 x weekly - 3 sets - 10 reps - Sit to Stand with Resistance Around Legs  - 1 x daily - 7 x weekly - 5 sets - 5 reps - Supine Heel Slide with Strap  - 1 x daily - 5 x weekly - 2 sets - 10 reps    Below measures were taken at time of initial evaluation unless otherwise specified:   DIAGNOSTIC FINDINGS: Cranial imaging revealed multi-compartmental intracerebral hemorrhage and neurosurgery was consulted for placement of intracranial pressure monitor. Other injuries included right globe rupture, right orbital fractures, right zygomatic arch fracture, left 9th rib fracture, right femoral fracture   COGNITION: Overall cognitive status:  2/3 short term memory recall  SENSATION: WFL  COORDINATION: WFL  EDEMA:  none  MUSCLE TONE: WNL   MUSCLE LENGTH: decreased right quad flexibility +Ely's   DTRs:  Patella 2+ = Normal and Achilles 2+ = Normal  POSTURE: No Significant postural limitations  LOWER EXTREMITY ROM:     ROM WNL  LOWER EXTREMITY MMT:    MMT Right Eval Left Eval  Hip flexion 3 5  Hip extension 2+ 5  Hip abduction 2+ 4  Hip adduction    Hip internal rotation    Hip external rotation    Knee flexion 4 5  Knee extension 4 5  Ankle dorsiflexion 5 5  Ankle plantarflexion    Ankle inversion    Ankle eversion    (Blank rows = not tested)  BED MOBILITY:  independent  TRANSFERS: Assistive device utilized: None  Sit to stand: Complete Independence Stand to sit: Complete Independence Chair to chair: Complete Independence Floor:  DNT  RAMP:  DNT  CURB:  Level of Assistance: Modified independence Assistive device utilized: None Curb Comments:   STAIRS:  Level of Assistance: Modified independence  Stair Negotiation Technique: Alternating Pattern  with Single  Rail on Left  Number of Stairs: 10   Height of Stairs: 4-6"  Comments: increased right lateral hip discomfort  GAIT: Gait pattern: trendelenburg and lateral lean- Right compensated Distance walked: community Assistive device utilized: None Level of assistance: Complete Independence Comments: pronounced deficit from lack of glute med stability  FUNCTIONAL TESTs:  5 times sit to stand: 13.31 sec Berg Balance Scale: 56/56 Dynamic Gait Index: 15/24 Functional gait assessment: NT  M-CTSIB  Condition 1: Firm Surface, EO 30 Sec, Normal Sway  Condition 2: Firm Surface, EC 30 Sec, Normal Sway  Condition 3: Foam Surface, EO 30 Sec, Normal Sway  Condition 4: Foam Surface, EC 30 Sec, Mild Sway     PATIENT SURVEYS:    TODAY'S TREATMENT:  Evaluation, HEP initiated, gait train w/ cane   PATIENT EDUCATION: Education details: regarding obtaining cane Person educated: Patient and Spouse Education method: Explanation Education comprehension: verbalized understanding      GOALS: Goals reviewed with patient? Yes  SHORT TERM GOALS: Target date: 07/27/2022  Patient will be independent in HEP to improve functional outcomes Baseline: Goal status: MET  2.  Demo ability to ambulate x 10 min w/ least restrictive AD without gait deviation in order to improve safety and efficiency of gait for community ambulation Baseline: no AD, right lateral lean/compensated Trendelenberg Goal status: MET 08/14/22    LONG TERM GOALS: Target date: 09/13/2022  Demo right hip abductor and hip extensor strength to 4-/5 to facilitate stable gait kinematics without deviation Baseline: 2+/5 w/ right lateral lean in stance phase; 4/5 and 3+/5 respectively 08/14/22 Goal status: IN PROGRESS 08/14/22  2.  Demo improved safety and balance per score 24/24 Dynamic Gait Index Baseline: 15/24; NT d/t pain levels 08/14/22 Goal status: IN PROGRESS 08/14/22  3.  Manifest improved balance, strength, and  coordination per ability to don/doff pants while standing Baseline: required to sit for lower body dressing; reports inability to perform today 08/16/22 Goal status: IN PROGRESS 08/16/22  4.  Demonstrate/report ability to ambulate x 30 min with gait deviations Baseline: unable; reports he is tolerating 20 min 08/14/22 Goal status: IN PROGRESS 08/14/22    ASSESSMENT:  CLINICAL IMPRESSION: Patient arrived to session with request to continue with PT as he still experiences imbalance and a fear of falls. Goals have also not fully been met, as he demonstrates  limited hip strength, reports decreased walking tolerance, and still demonstrates gait deviations. Patient scored 19/24 on DGI, indicating an improvement in balance however still with increased risk of falls. Most difficulty evident with stepping over obstacle, stairs, and head turns. Reviewed and updated HEP for max understanding and tolerance. Patient is demonstrating slow but steady progress towards goals. Would benefit from additional skilled PT service 1x/week for 4 weeks to address remaining goals.    OBJECTIVE IMPAIRMENTS Abnormal gait, decreased activity tolerance, decreased balance, difficulty walking, decreased strength, impaired flexibility, and pain.   ACTIVITY LIMITATIONS carrying, lifting, squatting, stairs, dressing, and locomotion level  PARTICIPATION LIMITATIONS: cleaning, shopping, community activity, occupation, and yard work  PERSONAL FACTORS 1 comorbidity: PMH  are also affecting patient's functional outcome.   REHAB POTENTIAL: Excellent  CLINICAL DECISION MAKING: Stable/uncomplicated  EVALUATION COMPLEXITY: Low  PLAN: PT FREQUENCY: 1x/week  PT DURATION: 4 weeks  PLANNED INTERVENTIONS: Therapeutic exercises, Therapeutic activity, Neuromuscular re-education, Balance training, Gait training, Patient/Family education, Self Care, Joint mobilization, Stair training, DME instructions, Aquatic Therapy, Dry Needling,  Electrical stimulation, Cryotherapy, Moist heat, Taping, and Manual therapy  PLAN FOR NEXT SESSION:  R SLS activities, R hip ABD and EX strength, walking endurance (treadmill?), stairs     Janene Harvey, PT, DPT 08/16/22 5:07 PM  [facility] [address], [place], [place] [address] Phone # [phone] Fax # [phone]

## 2022-08-16 ENCOUNTER — Ambulatory Visit: Payer: Medicare Other | Admitting: Physical Therapy

## 2022-08-16 ENCOUNTER — Encounter: Payer: Self-pay | Admitting: Physical Therapy

## 2022-08-16 DIAGNOSIS — R2689 Other abnormalities of gait and mobility: Secondary | ICD-10-CM

## 2022-08-16 DIAGNOSIS — R262 Difficulty in walking, not elsewhere classified: Secondary | ICD-10-CM

## 2022-08-16 DIAGNOSIS — M6281 Muscle weakness (generalized): Secondary | ICD-10-CM | POA: Diagnosis not present

## 2022-08-16 DIAGNOSIS — R2681 Unsteadiness on feet: Secondary | ICD-10-CM

## 2022-08-16 DIAGNOSIS — M25561 Pain in right knee: Secondary | ICD-10-CM

## 2022-08-22 NOTE — Therapy (Incomplete)
OUTPATIENT PHYSICAL THERAPY NEURO TREATMENT   Patient Name: Zachary Hill MRN: [id number] DOB:August 28, 1981, 41 y.o., male Today's Date: 08/22/2022    PCP: none listed REFERRING PROVIDER: Meredith Staggers, MD            Past Medical History:  Diagnosis Date   Acute renal failure ([place])    Anemia    Diabetes mellitus without complication ([place])    Hepatitis B    03/21/12 - sAg neg, sAb pos, cAb pos, indication prior infection   HIV disease ([place])    CD4 = 10, VL = 475K, 03/21/12.  Medication non-compliance   Past Surgical History:  Procedure Laterality Date   COLONOSCOPY  05/15/2012   Procedure: COLONOSCOPY;  Surgeon: Lear Ng, MD;  Location: [facility] ENDOSCOPY;  Service: Endoscopy;  Laterality: N/A;  unprepped   ESOPHAGOGASTRODUODENOSCOPY  05/13/2012   Procedure: ESOPHAGOGASTRODUODENOSCOPY (EGD);  Surgeon: Lear Ng, MD;  Location[facility] ENDOSCOPY;  Service: Endoscopy;  Laterality: N/A;   IR GASTROSTOMY TUBE MOD SED  04/24/2022   Patient Active Problem List   Diagnosis Date Noted   TBI (traumatic brain injury) ([place]) 05/09/2022   Acute on chronic respiratory failure with hypoxia ([place]) 04/10/2022   Diffuse traumatic brain injury with LOC of 6 hours to 24 hours, sequela ([place]) 04/10/2022   Tracheostomy status ([place]) 04/10/2022   Healthcare maintenance 05/07/2019   Type 2 diabetes mellitus with hyperglycemia, without long-term current use of insulin ([place]) 01/09/2019   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[place]) 06/29/2012   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[place]) 06/29/2012   Dyspnea 05/20/2012   Acute renal failure ([facility])    Anemia    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 ([place]) 05/18/2012   Diarrhea 05/14/2012   Pharyngitis 05/14/2012   Acute kidney injury ([place]) 05/14/2012   Thrombocytopenia ([place]) 05/14/2012   Odynophagia 05/13/2012   Fever 05/11/2012   Nausea & vomiting 05/11/2012   Candidal esophagitis ([place]) 04/29/2012   Asymptomatic human immunodeficiency virus (HIV) infection status ([place]) 03/29/2012    Human papillomavirus in conditions classified elsewhere and of unspecified site 03/29/2012    ONSET DATE: 03/08/22  REFERRING DIAG: H29.9X3S (ICD-10-CM) - Traumatic brain injury, with loss of consciousness of 1 hour to 5 hours 59 minutes, sequela ([place])   THERAPY DIAG:  No diagnosis found.  Rationale for Evaluation and Treatment Rehabilitation  SUBJECTIVE:                                                                                                                                                                                              SUBJECTIVE STATEMENT: Doing well. 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 Took  some pain meds since the knee was hurting earlier- not as achy now. Reports that he would like to continue PT. Reports that he still sits to put on pants and underwear- limited by fear of falling.   Pt accompanied by: significant other  PERTINENT HISTORY: Dysphagia Syphilis Neurosyphilis HIV positivity Diabetes mellitus (pre-diabetes) Paroxysmal tachycardia Hypertension Urinary retention Right globe rupture Anemia  PAIN:  Are you having pain? Yes: NPRS scale: 6/10 Pain location: R thigh/knee cap Pain description: achey, stinging  Aggravating factors: being still Relieving factors: movement, medication Notes his low back has been having discomfort towards end of day  PRECAUTIONS: None, WBAT RLE  WEIGHT BEARING RESTRICTIONS No  FALLS: Has patient fallen in last 6 months? No  LIVING ENVIRONMENT: Lives with: lives with their spouse Lives in: House/apartment Stairs: Yes: External: a few steps; on left going up Has following equipment at home: None  PLOF: Independent  PATIENT GOALS Be able to put pants on standing up (can't balance on one leg)  OBJECTIVE:     TODAY'S TREATMENT: 08/23/22 Activity Comments                       HOME EXERCISE PROGRAM Last updated: 08/17/22 Access Code: [email or web address] URL:  [email or web address] Date: 08/16/2022 Prepared by: [facility]  Exercises - Clamshell  - 1 x daily - 7 x weekly - 3 sets - 10 reps - Prone Hip Extension with Bent Knee - One Pillow  - 1 x daily - 7 x weekly - 3 sets - 10 reps - Side Stepping with Resistance at Thighs  - 1 x daily - 7 x weekly - 3 sets - 10 reps - Sit to Stand with Resistance Around Legs  - 1 x daily - 7 x weekly - 5 sets - 5 reps - Supine Heel Slide with Strap  - 1 x daily - 5 x weekly - 2 sets - 10 reps    Below measures were taken at time of initial evaluation unless otherwise specified:   DIAGNOSTIC FINDINGS: Cranial imaging revealed multi-compartmental intracerebral hemorrhage and neurosurgery was consulted for placement of intracranial pressure monitor. Other injuries included right globe rupture, right orbital fractures, right zygomatic arch fracture, left 9th rib fracture, right femoral fracture   COGNITION: Overall cognitive status:  2/3 short term memory recall   SENSATION: WFL  COORDINATION: WFL  EDEMA:  none  MUSCLE TONE: WNL   MUSCLE LENGTH: decreased right quad flexibility +Ely's   DTRs:  Patella 2+ = Normal and Achilles 2+ = Normal  POSTURE: No Significant postural limitations  LOWER EXTREMITY ROM:     ROM WNL  LOWER EXTREMITY MMT:    MMT Right Eval Left Eval  Hip flexion 3 5  Hip extension 2+ 5  Hip abduction 2+ 4  Hip adduction    Hip internal rotation    Hip external rotation    Knee flexion 4 5  Knee extension 4 5  Ankle dorsiflexion 5 5  Ankle plantarflexion    Ankle inversion    Ankle eversion    (Blank rows = not tested)  BED MOBILITY:  independent  TRANSFERS: Assistive device utilized: None  Sit to stand: Complete Independence Stand to sit: Complete Independence Chair to chair: Complete Independence Floor:  DNT  RAMP:  DNT  CURB:  Level of Assistance: Modified independence Assistive device utilized:  None Curb Comments:   STAIRS:  Level of Assistance: Modified independence  Stair  Negotiation Technique: Alternating Pattern  with Single Rail on Left  Number of Stairs: 10   Height of Stairs: 4-6"  Comments: increased right lateral hip discomfort  GAIT: Gait pattern: trendelenburg and lateral lean- Right compensated Distance walked: community Assistive device utilized: None Level of assistance: Complete Independence Comments: pronounced deficit from lack of glute med stability  FUNCTIONAL TESTs:  5 times sit to stand: 13.31 sec Berg Balance Scale: 56/56 Dynamic Gait Index: 15/24 Functional gait assessment: NT  M-CTSIB  Condition 1: Firm Surface, EO 30 Sec, Normal Sway  Condition 2: Firm Surface, EC 30 Sec, Normal Sway  Condition 3: Foam Surface, EO 30 Sec, Normal Sway  Condition 4: Foam Surface, EC 30 Sec, Mild Sway     PATIENT SURVEYS:    TODAY'S TREATMENT:  Evaluation, HEP initiated, gait train w/ cane   PATIENT EDUCATION: Education details: regarding obtaining cane Person educated: Patient and Spouse Education method: Explanation Education comprehension: verbalized understanding      GOALS: Goals reviewed with patient? Yes  SHORT TERM GOALS: Target date: 07/27/2022  Patient will be independent in HEP to improve functional outcomes Baseline: Goal status: MET  2.  Demo ability to ambulate x 10 min w/ least restrictive AD without gait deviation in order to improve safety and efficiency of gait for community ambulation Baseline: no AD, right lateral lean/compensated Trendelenberg Goal status: MET 08/14/22    LONG TERM GOALS: Target date: 09/13/2022  Demo right hip abductor and hip extensor strength to 4-/5 to facilitate stable gait kinematics without deviation Baseline: 2+/5 w/ right lateral lean in stance phase; 4/5 and 3+/5 respectively 08/14/22 Goal status: IN PROGRESS 08/14/22  2.  Demo improved safety and balance per score 24/24 Dynamic Gait  Index Baseline: 15/24; NT d/t pain levels 08/14/22 Goal status: IN PROGRESS 08/14/22  3.  Manifest improved balance, strength, and coordination per ability to don/doff pants while standing Baseline: required to sit for lower body dressing; reports inability to perform today 08/16/22 Goal status: IN PROGRESS 08/16/22  4.  Demonstrate/report ability to ambulate x 30 min with gait deviations Baseline: unable; reports he is tolerating 20 min 08/14/22 Goal status: IN PROGRESS 08/14/22    ASSESSMENT:  CLINICAL IMPRESSION: Patient arrived to session with request to continue with PT as he still experiences imbalance and a fear of falls. Goals have also not fully been met, as he demonstrates limited hip strength, reports decreased walking tolerance, and still demonstrates gait deviations. Patient scored 19/24 on DGI, indicating an improvement in balance however still with increased risk of falls. Most difficulty evident with stepping over obstacle, stairs, and head turns. Reviewed and updated HEP for max understanding and tolerance. Patient is demonstrating slow but steady progress towards goals. Would benefit from additional skilled PT service 1x/week for 4 weeks to address remaining goals.    OBJECTIVE IMPAIRMENTS Abnormal gait, decreased activity tolerance, decreased balance, difficulty walking, decreased strength, impaired flexibility, and pain.   ACTIVITY LIMITATIONS carrying, lifting, squatting, stairs, dressing, and locomotion level  PARTICIPATION LIMITATIONS: cleaning, shopping, community activity, occupation, and yard work  PERSONAL FACTORS 1 comorbidity: PMH  are also affecting patient's functional outcome.   REHAB POTENTIAL: Excellent  CLINICAL DECISION MAKING: Stable/uncomplicated  EVALUATION COMPLEXITY: Low  PLAN: PT FREQUENCY: 1x/week  PT DURATION: 4 weeks  PLANNED INTERVENTIONS: Therapeutic exercises, Therapeutic activity, Neuromuscular re-education, Balance training, Gait  training, Patient/Family education, Self Care, Joint mobilization, Stair training, DME instructions, Aquatic Therapy, Dry Needling, Electrical stimulation, Cryotherapy, Moist heat, Taping, and Manual therapy  PLAN FOR NEXT SESSION:  R SLS activities, R hip ABD and EX strength, walking endurance (treadmill?), stairs     Janene Harvey, [place], DPT 08/22/22 11:24 AM  [facility] at [facility] [address] [place], [place] [address] Phone # [phone] Fax # [phone]

## 2022-08-23 ENCOUNTER — Ambulatory Visit: Payer: Medicare Other | Admitting: Physical Therapy

## 2022-08-28 ENCOUNTER — Ambulatory Visit: Payer: Medicare Other

## 2022-08-28 ENCOUNTER — Ambulatory Visit: Payer: Medicare Other | Admitting: Physical Therapy

## 2022-08-28 DIAGNOSIS — R41841 Cognitive communication deficit: Secondary | ICD-10-CM

## 2022-08-28 DIAGNOSIS — M6281 Muscle weakness (generalized): Secondary | ICD-10-CM | POA: Diagnosis not present
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
## 2022-08-28 NOTE — Patient Instructions (Addendum)
    Let's be thinking about a scenario that would work for [employer] to get all the therapy he needs and all the minutes he needs for therapies!  Option to switch to [address] clinic where [place] could pick [name] up - if he gets Medicaid transportation THERE.    Secondly, [name] and I talked about keeping his prescription for Ritalin , which would help his focus and concentration (and therefore would help his memory).   We set an alarm for [name] to tell [name] when he needs his meds - 9:30pm  Erskine Squibb stated she really wants to see if Broly would progress further in PT if [name] has pain managed better  - Erskine Squibb needs this to happen or she may have to discharge or put on hold until pain is managed better.

## 2022-08-30 ENCOUNTER — Ambulatory Visit: Payer: Medicare Other

## 2022-08-30 ENCOUNTER — Telehealth (HOSPITAL_COMMUNITY): Payer: Self-pay | Admitting: Physical Medicine & Rehabilitation

## 2022-08-30 DIAGNOSIS — M6281 Muscle weakness (generalized): Secondary | ICD-10-CM | POA: Diagnosis not present

## 2022-08-30 DIAGNOSIS — R41841 Cognitive communication deficit: Secondary | ICD-10-CM

## 2022-08-30 MED ORDER — METHYLPHENIDATE HCL 10 MG PO TABS: 10.0000 mg | ORAL_TABLET | Freq: Two times a day (BID) | ORAL | 0 refills | Status: DC
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
## 2022-08-30 NOTE — Telephone Encounter (Signed)
Hi Carl  I refilled his ritalin but he noshowed for his appt with me. He won't be getting any pain meds without a visit nor any further ritalin.     Thanks!

## 2022-08-30 NOTE — Telephone Encounter (Signed)
-----   Message from Barron Alvine, CCC-SLP sent at 08/30/2022  4:29 PM EST ----- Hi Dr. Riley Kill- I'm treating Zachary Hill right now (second session). His Ritalin ran out and he never asked for a refill. I wonder how he would perform with a boost in attention from Ritalin. Would you be able to call in a script for this? Secondly, he wanted me to ask about pain meds. The OTC Tylenol is not enough for his pain - either doesn't last long enough or is not strong enough. I told him I would ask you about this and that you may need to see him to assess what he needs for this. PT is at a standstill until his pain is managed better. Erskine Squibb (our PT at [place]) has suggested they get in touch with one of his doctors about this but I'm not sure if they have or haven't done this.  Thanks for considering! Baldo Ash

## 2022-08-31 ENCOUNTER — Telehealth: Payer: Self-pay

## 2022-08-31 ENCOUNTER — Telehealth: Payer: Self-pay | Admitting: Physical Therapy

## 2022-08-31 NOTE — Telephone Encounter (Signed)
Methylphenidate approved 08/01/22 until further notice. [id number]

## 2022-08-31 NOTE — Telephone Encounter (Signed)
   Left voicemail on patient's cell phone explaining Dr. Riley Kill' recommendation to be seen for an in-office visit in order to refill pain meds. Explained that we will cancel patient's PT appointments until he is able to f/u with MD to to that. Left office number if patient has any questions/concerns.  Anette Guarneri, PT, DPT 08/31/22 11:29 AM  [facility] [address] [place], [address] [place], [place] [address] Phone # [phone] Fax # [phone]

## 2022-09-04 ENCOUNTER — Ambulatory Visit: Payer: Medicare Other | Attending: Physician Assistant

## 2022-09-04 ENCOUNTER — Telehealth: Payer: Self-pay

## 2022-09-04 ENCOUNTER — Ambulatory Visit: Payer: Medicare Other | Admitting: Physical Therapy

## 2022-09-04 DIAGNOSIS — R41841 Cognitive communication deficit: Secondary | ICD-10-CM | POA: Insufficient documentation

## 2022-09-04 NOTE — Telephone Encounter (Signed)
Methylphenidate approved for 08/01/22 until further notice.

## 2022-09-04 NOTE — Therapy (Signed)
[place] [facility] [address] [place], [place], [address] Phone: [phone]   Fax:  [phone]  Patient Details  Name: Zachary Hill MRN: [id number] Date of Birth: 1981/08/15 Referring Provider:  Faith Rogue, MD  Encounter Date: 09/04/2022   Speech Therapy  - No Show SLP called pt and made contact at the telephone number listed for pt in Epic. SLP told pt that he no-showed his 1615 appointment today and encouraged [name] and him to call to reschedule this appointment. SLP also reminded pt about clinic attendance policy and that one more no show would have to result in d/c from therapy services (PT, OT, and/or ST) and a new prescription would have to be rec'd to continue.  SLP encouraged pt or [name] to phone clinic and notify if he is going to be unable to make scheduled appointments. Pt voiced understanding. SLP repeated information about calling to reschedule today's visit once more to [place] and he again voiced understanding.   [facility], CCC-SLP 09/04/2022, 5:04 PM  [place] [facility] [address], [address] [place], [place], [address] Phone: [phone]   Fax:  [phone]

## 2022-09-06 ENCOUNTER — Ambulatory Visit: Payer: Medicare Other

## 2022-09-06 DIAGNOSIS — R41841 Cognitive communication deficit: Secondary | ICD-10-CM
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
## 2022-09-11 ENCOUNTER — Ambulatory Visit: Payer: Medicare Other | Admitting: Physical Therapy

## 2022-09-11 ENCOUNTER — Ambulatory Visit: Payer: Medicare Other

## 2022-09-11 DIAGNOSIS — R41841 Cognitive communication deficit: Secondary | ICD-10-CM
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
## 2022-09-13 ENCOUNTER — Ambulatory Visit: Payer: Medicare Other

## 2022-09-13 DIAGNOSIS — R41841 Cognitive communication deficit: Secondary | ICD-10-CM | POA: Diagnosis not present
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
## 2022-09-18 ENCOUNTER — Ambulatory Visit: Payer: Medicare Other

## 2022-09-20 ENCOUNTER — Ambulatory Visit: Payer: Medicare Other

## 2022-09-20 DIAGNOSIS — R41841 Cognitive communication deficit: Secondary | ICD-10-CM
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
## 2022-09-27 ENCOUNTER — Ambulatory Visit: Payer: Medicare Other

## 2022-09-27 DIAGNOSIS — R41841 Cognitive communication deficit: Secondary | ICD-10-CM

## 2022-09-28 ENCOUNTER — Ambulatory Visit: Payer: Medicare Other
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
## 2022-09-28 NOTE — Therapy (Signed)
[place] [facility] [address] [place], [place], [address] Phone: [phone]   Fax:  [phone]  Patient Details  Name: Zachary Hill MRN: [id number] Date of Birth: 1981/01/11 Referring Provider: Faith Rogue, MD  Encounter Date: 09/28/2022  ST - Arrive/Cancel Pt arrived at 1554 (24 minutes late) with significant other, [name], for his scheduled appointment with start time of 1530. [name] told receptionist he knew his appointment was at 1530.  He was told he could not be seen today due to being >15 minutes late to his appointment. Pt left with [name].   [facility], CCC-SLP 09/28/2022, 4:06 PM  [facility] [address] [place], [place], [address] Phone: [phone]   Fax:  [phone]

## 2022-10-03 ENCOUNTER — Ambulatory Visit: Payer: Medicare Other | Attending: Physician Assistant

## 2022-10-03 DIAGNOSIS — R41841 Cognitive communication deficit: Secondary | ICD-10-CM | POA: Diagnosis present
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
## 2022-10-04 ENCOUNTER — Encounter: Payer: Medicare Other | Attending: Physical Medicine and Rehabilitation | Admitting: Physical Medicine & Rehabilitation

## 2022-10-04 ENCOUNTER — Encounter: Payer: Self-pay | Admitting: Physical Medicine & Rehabilitation

## 2022-10-04 ENCOUNTER — Inpatient Hospital Stay: Payer: Medicare Other | Admitting: Physical Medicine & Rehabilitation

## 2022-10-04 VITALS — BP 114/81 | HR 83 | Ht 71.0 in | Wt 159.0 lb

## 2022-10-04 DIAGNOSIS — S0531XS Ocular laceration without prolapse or loss of intraocular tissue, right eye, sequela: Secondary | ICD-10-CM | POA: Insufficient documentation

## 2022-10-04 DIAGNOSIS — S062X4S Diffuse traumatic brain injury with loss of consciousness of 6 hours to 24 hours, sequela: Secondary | ICD-10-CM | POA: Insufficient documentation

## 2022-10-04 MED ORDER — METHYLPHENIDATE HCL 5 MG PO TABS
5.0000 mg | ORAL_TABLET | Freq: Two times a day (BID) | ORAL | 0 refills | Status: AC
Start: 2022-10-04 — End: ?

## 2022-10-04 NOTE — Patient Instructions (Addendum)
MELATONIN 5-10MG  AN 30 MINUTES TO AN HOUR BEFORE BED  FOR NERVE PAIN, OMEGA 3 FATTY ACIDS, B COMPLEX    RITALIN: TAKE 5MG  AT BREAKFAST AND LUNCH FOR 2 WEEKS THEN 5MG  AT BREAKFAST UNTIL EMPTY

## 2022-10-04 NOTE — Progress Notes (Signed)
e
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
## 2022-10-05 ENCOUNTER — Ambulatory Visit: Payer: Medicare Other

## 2022-10-05 NOTE — Therapy (Signed)
[facility] [address] [place], [place], [address] Phone: [phone]   Fax:  [phone]  Patient Details  Name: Zachary Hill MRN: [id number] Date of Birth: 26-Feb-1981 Referring Provider:  Alger Simons, MD  Encounter Date: 10/05/2022   SPEECH THERAPY DISCHARGE SUMMARY  Visits from Start of Care: 9  Current functional level related to goals / functional outcomes: Unfortunately, pt did not adhere to clinic attendance policy and was discharged due to excessive no-shows.  Today, SLP called pt's phone number in [facility] and got VM. SLP left message pertaining to d/c and encouraged pt if he still desired ST to obtain new script from PCP or from referring MD Naaman Plummer) and pt could be scheduled for a speech evaluation.   Pt made some minimal gains with attention and began using some memory compensations. Memory compensations were not being used consistently and pt still req'd cues to initiate use.  [name] still relies fairly heavily on [name], significant other, for appointment management and other IADLs.  STGs and LTGs at time of d/c are below:   SHORT TERM GOALS: Target date: 09/24/2022  -altered due to first ST session on 08-28-22   Pt will sustain 8 minutes attention in functional therapy tasks in 3 sessions Baseline:09/13/22 Goal status: not met   2.  Pt will develop memory system for use and bring to 80% of therapy sessions for 3 sessions Baseline:  Goal status: not met   3.  Pt will demo intellectual awareness by telling SLP 2 deficit areas in 3 sessions Baseline: 09/11/22 (PROM) Goal status: partially met     LONG TERM GOALS: Target date: 10/12/2022     Pt will demo 10 minutes of attention in mod noisy environment, for functional therapy tasks in 3 sessions Baseline:  Goal status: Ongoing   2.  Pt will successfully use memory compensations in/between 3 sessions Baseline:  Goal status: Ongoing   3.  Pt will demo  anticipatory awareness by spontaneously double checking responses in functional linguistic task/s in 3 sessions Baseline:  Goal status: Ongoing   4.  Pt will successfully use attention strategies in/between 3 sessions Baseline:  Goal status: Ongoing     Remaining deficits: Mod cognitive linguistic deficits.  Education / Equipment: Compensations for decr'd cognitive linguistic skills, need for double checking with all tasks, rationale for double checking, using reminder apps and alarms are beneficial, and the rationale for using these.   Patient agrees to discharge. Patient long term goals were not met. Patient is being discharged due to  not adhering to clinic attendance policy. Should he desire ST he can obtain a new script and pt will be scheduled for ST evaluation.   [place], [place] 10/05/2022, 4:53 PM  [facility] [address] [place], [place], [address] Phone: [phone]   Fax:  [phone]

## 2022-10-24 ENCOUNTER — Telehealth: Payer: Self-pay | Admitting: Physical Medicine & Rehabilitation

## 2022-10-24 NOTE — Telephone Encounter (Signed)
Patient would like to get a letter that he can be released to go back to work and also a note for jury duty.  Any questions please call patient.

## 2022-12-19 ENCOUNTER — Telehealth: Payer: Self-pay | Admitting: *Deleted

## 2022-12-19 NOTE — Telephone Encounter (Signed)
Patient pharmacy updated per fax [facility]

## 2022-12-20 ENCOUNTER — Telehealth: Payer: Self-pay

## 2022-12-20 NOTE — Telephone Encounter (Signed)
New pharmacy [facility] needs new prescriptions for Methylphenidate 10 mg and Methylphenidate 5 mg

## 2022-12-20 NOTE — Telephone Encounter (Signed)
I had given him an rx at last visit for 5mg  with instructions to wean off. Please clarify this request for 10mg  and 5mg  rx'es.  Thx

## 2022-12-21 NOTE — Telephone Encounter (Signed)
It is a fax from his new pharmacy. Spoke to patient and states he is only taking the 10 mg.

## 2023-01-03 ENCOUNTER — Encounter: Payer: Medicare Other | Attending: Physical Medicine and Rehabilitation | Admitting: Physical Medicine & Rehabilitation

## 2023-01-03 DIAGNOSIS — S0531XS Ocular laceration without prolapse or loss of intraocular tissue, right eye, sequela: Secondary | ICD-10-CM | POA: Insufficient documentation

## 2023-01-03 DIAGNOSIS — S062X4S Diffuse traumatic brain injury with loss of consciousness of 6 hours to 24 hours, sequela: Secondary | ICD-10-CM | POA: Insufficient documentation

## 2023-01-09 NOTE — Telephone Encounter (Signed)
Sent to [facility]

## 2023-01-10 ENCOUNTER — Other Ambulatory Visit: Payer: Self-pay | Admitting: Physical Medicine & Rehabilitation

## 2023-04-26 ENCOUNTER — Other Ambulatory Visit: Payer: Self-pay

## 2023-04-26 ENCOUNTER — Emergency Department (HOSPITAL_BASED_OUTPATIENT_CLINIC_OR_DEPARTMENT_OTHER)
Admission: EM | Admit: 2023-04-26 | Discharge: 2023-04-26 | Disposition: A | Discharge status: Home / Self Care | Payer: Medicare PPO | Attending: Emergency Medicine | Admitting: Emergency Medicine

## 2023-04-26 DIAGNOSIS — Z1152 Encounter for screening for COVID-19: Secondary | ICD-10-CM | POA: Insufficient documentation

## 2023-04-26 DIAGNOSIS — X58XXXA Exposure to other specified factors, initial encounter: Secondary | ICD-10-CM | POA: Diagnosis not present

## 2023-04-26 DIAGNOSIS — Z7984 Long term (current) use of oral hypoglycemic drugs: Secondary | ICD-10-CM | POA: Diagnosis not present

## 2023-04-26 DIAGNOSIS — S46812A Strain of other muscles, fascia and tendons at shoulder and upper arm level, left arm, initial encounter: Secondary | ICD-10-CM | POA: Diagnosis not present

## 2023-04-26 DIAGNOSIS — R200 Anesthesia of skin: Secondary | ICD-10-CM | POA: Diagnosis present

## 2023-04-26 DIAGNOSIS — Z21 Asymptomatic human immunodeficiency virus [HIV] infection status: Secondary | ICD-10-CM | POA: Insufficient documentation

## 2023-04-26 DIAGNOSIS — Z87820 Personal history of traumatic brain injury: Secondary | ICD-10-CM | POA: Diagnosis not present

## 2023-04-26 DIAGNOSIS — E119 Type 2 diabetes mellitus without complications: Secondary | ICD-10-CM | POA: Diagnosis not present

## 2023-04-26 LAB — RESP PANEL BY RT-PCR (RSV, FLU A&B, COVID)  RVPGX2
Influenza A by PCR: NEGATIVE
Influenza B by PCR: NEGATIVE
Resp Syncytial Virus by PCR: NEGATIVE
SARS Coronavirus 2 by RT PCR: NEGATIVE

## 2023-04-26 MED ORDER — KETOROLAC TROMETHAMINE 30 MG/ML IJ SOLN
30.0000 mg | Freq: Once | INTRAMUSCULAR | Status: AC
  Administered 2023-04-26: 30 mg via INTRAMUSCULAR
  Filled 2023-04-26: qty 1

## 2023-04-26 MED ORDER — LIDOCAINE 5 % EX PTCH: 1.0000 | MEDICATED_PATCH | CUTANEOUS | 0 refills | Status: AC

## 2023-04-26 MED ORDER — CYCLOBENZAPRINE HCL 10 MG PO TABS: 10.0000 mg | ORAL_TABLET | Freq: Two times a day (BID) | ORAL | 0 refills | Status: AC | PRN

## 2023-04-26 MED ORDER — CYCLOBENZAPRINE HCL 10 MG PO TABS
10.0000 mg | ORAL_TABLET | Freq: Once | ORAL | Status: AC
  Administered 2023-04-26: 10 mg via ORAL
  Filled 2023-04-26: qty 1

## 2023-04-26 MED ORDER — ACETAMINOPHEN 500 MG PO TABS
1000.0000 mg | ORAL_TABLET | Freq: Once | ORAL | Status: AC
  Administered 2023-04-26: 1000 mg via ORAL
  Filled 2023-04-26: qty 2

## 2023-04-26 MED ORDER — LIDOCAINE 5 % EX PTCH
1.0000 | MEDICATED_PATCH | Freq: Once | CUTANEOUS | Status: DC
  Administered 2023-04-26: 1 via TRANSDERMAL
  Filled 2023-04-26: qty 1

## 2023-04-26 NOTE — Discharge Instructions (Signed)
You were seen in the emergency department for your left shoulder and arm pain.  This appeared to be muscle strain or spasm type of pain.  You had no signs of any broken bones or injury to your nerves.  We gave you Tylenol, Toradol, muscle relaxer and a lidocaine patch and your symptoms improved.  You can continue to take these medications in addition to your gabapentin as needed for pain.  The Flexeril can make you drowsy so do not take it before driving, working or operating heavy machinery.  You can follow-up with your primary doctor or with pain management to have your symptoms rechecked.  You should return to the emergency department if you have numbness or weakness in your arm, your fingers turn blue or if you have any other new or concerning symptoms.
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
## 2023-04-26 NOTE — ED Triage Notes (Addendum)
Pt presents to ED POV. PT c/o L side pain x4d. Pt reports pain in his left side, l arm, l shoulder. Reports pain worsens w/ movement

## 2023-05-07 NOTE — Progress Notes (Signed)
Patient was evaluate by triage RN and had viral swab ordered in triage. Ordered in the setting of body aches as possible cause of symptoms.

## 2024-03-31 ENCOUNTER — Other Ambulatory Visit (HOSPITAL_COMMUNITY): Payer: Self-pay

## 2024-03-31 ENCOUNTER — Telehealth: Payer: Self-pay

## 2024-03-31 NOTE — Telephone Encounter (Signed)
 Pharmacy Patient Advocate Encounter  Insurance verification completed.   The patient is insured through [employer]   Ran test claim for [employer]. Currently a quantity of 30 is a 30 day supply and last filled on 03/15/24 next fill date is 04/06/24 .   This test claim was processed through [facility]- copay amounts may vary at other pharmacies due to pharmacy/plan contracts, or as the patient moves through the different stages of their insurance plan.

## 2024-04-01 NOTE — Progress Notes (Deleted)
 HPI: Zachary Hill is a 43 y.o. male who presents to the [facility] clinic today to transfer care for HIV.  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   Dyspnea 05/20/2012   Acute renal failure (HCC)    Anemia    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) 05/18/2012   Diarrhea 05/14/2012   Pharyngitis 05/14/2012   Acute kidney injury (HCC) 05/14/2012   Thrombocytopenia (HCC) 05/14/2012   Odynophagia 05/13/2012   Fever 05/11/2012   Nausea & vomiting 05/11/2012   Candidal esophagitis (HCC) 04/29/2012   Asymptomatic human immunodeficiency virus (HIV) infection status (HCC) 03/29/2012   Human papillomavirus in conditions classified elsewhere and of unspecified site 03/29/2012    Patient's Medications  New Prescriptions   No medications on file  Previous Medications   ACETAMINOPHEN  (TYLENOL ) 325 MG TABLET    Take 1-2 tablets (325-650 mg total) by mouth every 4 (four) hours as needed for mild pain.   ATORVASTATIN  (LIPITOR) 40 MG TABLET    Take 1 tablet (40 mg total) by mouth at bedtime.   ATROPINE  1 % OPHTHALMIC SOLUTION    Place 1 drop into the right eye 2 (two) times daily.   CIPROFLOXACIN  (CILOXAN ) 0.3 % OPHTHALMIC SOLUTION    Place 1 drop into the right eye 4 (four) times daily. Administer 1 drop, every 2 hours, while awake, for 2 days. Then 1 drop, every 4 hours, while awake, for the next 5 days.   CYCLOBENZAPRINE  (FLEXERIL ) 10 MG TABLET    Take 1 tablet (10 mg total) by mouth 2 (two) times daily as needed for muscle spasms.    DARUNAVIR  (PREZISTA ) 800 MG TABLET    TAKE 1 TABLET (800 MG TOTAL) BY MOUTH DAILY.   DILTIAZEM  (CARDIZEM ) 90 MG TABLET    Take 1 tablet (90 mg total) by mouth every 12 (twelve) hours.   DOLUTEGRAVIR -RILPIVIRINE (JULUCA) 50-25 MG TABLET    Take 1 tablet by mouth daily.   ELVITEGRAVIR-COBICISTAT-EMTRICITABINE -TENOFOVIR  (GENVOYA ) 150-150-200-10 MG TABS TABLET    Take 1 tablet by mouth daily with breakfast.   ERYTHROMYCIN  OPHTHALMIC OINTMENT    Place into the right eye 2 (two) times daily.   FAMOTIDINE  (PEPCID ) 20 MG TABLET    Take 1 tablet (20 mg total) by mouth daily.   FOLIC ACID  (FOLVITE ) 1 MG TABLET    Take 1 tablet (1 mg total) by mouth daily.   JARDIANCE 25 MG TABS TABLET    Take 25 mg by mouth daily.   LIDOCAINE  (LIDODERM ) 5 %    Place 1 patch onto the skin daily. Remove & Discard patch within 12 hours or as directed by MD   MAGNESIUM  OXIDE (MAG-OX) 400 (240 MG) MG TABLET    Take 1 tablet (400 mg total) by mouth daily.   METFORMIN  (GLUCOPHAGE ) 1000 MG TABLET    TAKE 1 TABLET BY MOUTH TWICE A DAY   METHYLPHENIDATE  (RITALIN ) 10 MG TABLET    TAKE ONE TABLET BY MOUTH TWICE DAILY   METHYLPHENIDATE  (RITALIN ) 5 MG TABLET    Take 1 tablet (5  mg total) by mouth 2 (two) times daily with breakfast and lunch.   METOPROLOL  TARTRATE (LOPRESSOR ) 25 MG TABLET    Take 1 tablet (25 mg total) by mouth 2 (two) times daily.   MULTIPLE VITAMINS-IRON  (MULTIVITAMINS WITH IRON ) TABS TABLET    Take 1 tablet by mouth daily.   OLANZAPINE  (ZYPREXA ) 10 MG TABLET    Take 1 tablet (10 mg total) by mouth at bedtime.   OZEMPIC, 0.25 OR 0.5 MG/DOSE, 2 MG/3ML SOPN    Inject 0.5 mg into the skin once a week.   PREDNISOLONE  ACETATE (PRED FORTE ) 1 % OPHTHALMIC SUSPENSION    Place 1 drop into the right eye 4 (four) times daily.   SERTRALINE  (ZOLOFT ) 25 MG TABLET    Take 1 tablet (25 mg total) by mouth daily.  Modified Medications   No medications on file  Discontinued Medications   No medications on file     Allergies: Allergies  Allergen Reactions   Sulfa Antibiotics Other (See Comments)   Sulfamethoxazole -Trimethoprim Anaphylaxis, Itching and Rash    Severe rash, mild anaphylaxis   Tomato Anaphylaxis   Fruit & Vegetable Daily [Nutritional Supplements]     Allergic to RAW fruits and vegetables .    Past Medical History: Past Medical History:  Diagnosis Date   Acute renal failure (HCC)    Anemia    Diabetes mellitus without complication (HCC)    Hepatitis B    03/21/12 - sAg neg, sAb pos, cAb pos, indication prior infection   HIV disease (HCC)    CD4 = 10, VL = 475K, 03/21/12.  Medication non-compliance    Social History: Social History   Socioeconomic History   Marital status: Single    Spouse name: Not on file   Number of children: Not on file   Years of education: Not on file   Highest education level: Not on file  Occupational History   Occupation: STUDENT    Employer: [employer]  Tobacco Use   Smoking status: Every Day    Current packs/day: 0.33    Average packs/day: 0.3 packs/day for 10.0 years (3.3 ttl pk-yrs)    Types: Cigarettes   Smokeless tobacco: Never  Vaping Use   Vaping status: Every Day   Substances: Nicotine   Substance and Sexual Activity   Alcohol use: Yes    Alcohol/week: 3.0 standard drinks of alcohol    Types: 3 Standard drinks or equivalent per week    Comment: red wine , social    Drug use: No   Sexual activity: Not Currently    Partners: Male  Other Topics Concern   Not on file  Social History Narrative   Not on file   Social Drivers of Health   Financial Resource Strain: Not on file  Food Insecurity: High Risk (03/26/2024)   Received from [facility]   Hunger Vital Sign    Within the past 12 months, you worried that your food would run out before you got money to buy more: Often true    Within the past 12 months, the food you bought just didn't last and you didn't have money to get more. : Often true  Transportation Needs:  No Transportation Needs (03/26/2024)   Received from [facility]    In the past 12 months, has lack of reliable transportation kept you from medical appointments, meetings, work or from getting things needed for daily living? : No  Physical Activity: Not on file  Stress: Not on  file  Social Connections: Unknown (02/07/2022)   [facility]   Social Network    Social Network: Not on file    Labs: Lab Results  Component Value Date   HIV1RNAQUANT 88 (H) 02/09/2020   HIV1RNAQUANT 67 (H) 04/10/2019   HIV1RNAQUANT 114 (H) 12/16/2018   CD4TABS 598 02/09/2020   CD4TABS 461 04/10/2019   CD4TABS 460 12/16/2018    RPR and STI Lab Results  Component Value Date   LABRPR Reactive (A) 05/03/2022   LABRPR Reactive (A) 04/30/2022   LABRPR Reactive (A) 04/20/2022   LABRPR Reactive (A) 04/14/2022   LABRPR Reactive (A) 04/11/2022   RPRTITER 1:8 (H) 02/09/2020   RPRTITER 1:8 (H) 12/16/2018    STI Results GC CT  04/10/2019 12:00 AM Negative  Negative   02/10/2016 12:00 AM *Negative    *Negative  ***POSITIVE**    *Negative   02/11/2015 12:00 AM Negative  Negative   09/14/2014 12:00 AM NG: Negative  CT: Negative     Hepatitis B Lab Results  Component Value Date   HEPBSAB POS (A) 03/21/2012   HEPBSAG NEGATIVE 03/21/2012   HEPBCAB POS (A) 03/21/2012   Hepatitis C No results found for: HEPCAB, HCVRNAPCRQN Hepatitis A Lab Results  Component Value Date   HAV NEG 03/21/2012   Lipids: Lab Results  Component Value Date   CHOL 228 (H) 11/06/2013   TRIG 652 (H) 11/06/2013   HDL 28 (L) 11/06/2013   CHOLHDL 8.1 11/06/2013   VLDL NOT CALC 11/06/2013   LDLCALC NOT CALC 11/06/2013    Current HIV Regimen: Biktarvy  Assessment: Abdimalik is here today to transfer care with [facility] for chronic HIV infection. Met with patient today and explained the clinic's pharmacy services.  Patient is taking Biktarvy and tolerating it without any issues.  No side  effects or missed doses noted. HIV viral load is 73 which has trended down from 289,000 when he had been off of Biktarvy for several months. Gave patient my card and told him to call me with any issues/questions/concerns in the future.  Plan: Continue Biktarvy  Alan Geralds, PharmD, CPP, BCIDP, AAHIVP Clinical Pharmacist Practitioner Infectious Diseases Clinical Pharmacist [facility] 04/01/2024, 2:42 PM

## 2024-04-03 ENCOUNTER — Ambulatory Visit: Admitting: Infectious Diseases

## 2024-04-03 ENCOUNTER — Ambulatory Visit: Admitting: Pharmacist
